# Patient Record
Sex: Male | Born: 1950
Health system: Southern US, Community
[De-identification: ages and names within clinical notes are randomized; demographics above are authoritative.]

## PROBLEM LIST (undated history)

## (undated) DIAGNOSIS — G4733 Obstructive sleep apnea (adult) (pediatric): Secondary | ICD-10-CM

## (undated) DIAGNOSIS — N200 Calculus of kidney: Secondary | ICD-10-CM

## (undated) DIAGNOSIS — Z87448 Personal history of other diseases of urinary system: Secondary | ICD-10-CM

## (undated) DIAGNOSIS — Z860100 Personal history of colon polyps, unspecified: Secondary | ICD-10-CM

## (undated) DIAGNOSIS — I2510 Atherosclerotic heart disease of native coronary artery without angina pectoris: Secondary | ICD-10-CM

## (undated) DIAGNOSIS — N529 Male erectile dysfunction, unspecified: Secondary | ICD-10-CM

## (undated) DIAGNOSIS — N400 Enlarged prostate without lower urinary tract symptoms: Secondary | ICD-10-CM

## (undated) DIAGNOSIS — H35719 Central serous chorioretinopathy, unspecified eye: Secondary | ICD-10-CM

## (undated) DIAGNOSIS — E785 Hyperlipidemia, unspecified: Secondary | ICD-10-CM

## (undated) DIAGNOSIS — R600 Localized edema: Secondary | ICD-10-CM

## (undated) DIAGNOSIS — I4821 Permanent atrial fibrillation: Secondary | ICD-10-CM

## (undated) DIAGNOSIS — I4819 Other persistent atrial fibrillation: Secondary | ICD-10-CM

## (undated) DIAGNOSIS — K219 Gastro-esophageal reflux disease without esophagitis: Secondary | ICD-10-CM

## (undated) DIAGNOSIS — R399 Unspecified symptoms and signs involving the genitourinary system: Secondary | ICD-10-CM

## (undated) DIAGNOSIS — Z87442 Personal history of urinary calculi: Secondary | ICD-10-CM

## (undated) DIAGNOSIS — Z87828 Personal history of other (healed) physical injury and trauma: Secondary | ICD-10-CM

## (undated) DIAGNOSIS — Z955 Presence of coronary angioplasty implant and graft: Secondary | ICD-10-CM

## (undated) DIAGNOSIS — G20A1 Parkinson's disease without dyskinesia, without mention of fluctuations: Secondary | ICD-10-CM

## (undated) DIAGNOSIS — I10 Essential (primary) hypertension: Secondary | ICD-10-CM

## (undated) DIAGNOSIS — Z7901 Long term (current) use of anticoagulants: Secondary | ICD-10-CM

## (undated) DIAGNOSIS — G20 Parkinson's disease: Secondary | ICD-10-CM

## (undated) HISTORY — DX: Male erectile dysfunction, unspecified: N52.9

## (undated) HISTORY — PX: CORONARY ANGIOPLASTY WITH STENT PLACEMENT: SHX49

## (undated) HISTORY — DX: Localized edema: R60.0

## (undated) HISTORY — PX: CARDIOVASCULAR STRESS TEST: SHX262

## (undated) HISTORY — DX: Permanent atrial fibrillation: I48.21

## (undated) HISTORY — PX: HERNIA REPAIR: SHX51

---
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
## 1999-08-11 ENCOUNTER — Encounter: Payer: Self-pay | Admitting: Emergency Medicine

## 1999-08-11 ENCOUNTER — Inpatient Hospital Stay (HOSPITAL_COMMUNITY): Admission: EM | Admit: 1999-08-11 | Discharge: 1999-08-13 | Payer: Self-pay | Admitting: Urology

## 1999-08-11 ENCOUNTER — Encounter (INDEPENDENT_AMBULATORY_CARE_PROVIDER_SITE_OTHER): Payer: Self-pay

## 1999-08-12 ENCOUNTER — Encounter: Payer: Self-pay | Admitting: Emergency Medicine

## 1999-08-12 ENCOUNTER — Encounter: Payer: Self-pay | Admitting: Urology

## 1999-08-14 ENCOUNTER — Inpatient Hospital Stay (HOSPITAL_COMMUNITY): Admission: AD | Admit: 1999-08-14 | Discharge: 1999-08-16 | Payer: Self-pay | Admitting: Urology

## 1999-08-15 ENCOUNTER — Encounter: Payer: Self-pay | Admitting: Urology

## 1999-10-29 ENCOUNTER — Ambulatory Visit (HOSPITAL_COMMUNITY): Admission: RE | Admit: 1999-10-29 | Discharge: 1999-10-29 | Payer: Self-pay | Admitting: Urology

## 1999-10-29 ENCOUNTER — Encounter: Payer: Self-pay | Admitting: Urology

## 2000-07-07 ENCOUNTER — Encounter: Payer: Self-pay | Admitting: Urology

## 2000-07-07 ENCOUNTER — Ambulatory Visit (HOSPITAL_COMMUNITY): Admission: RE | Admit: 2000-07-07 | Discharge: 2000-07-07 | Payer: Self-pay | Admitting: Urology

## 2002-01-20 DIAGNOSIS — I2510 Atherosclerotic heart disease of native coronary artery without angina pectoris: Secondary | ICD-10-CM

## 2002-01-20 HISTORY — DX: Atherosclerotic heart disease of native coronary artery without angina pectoris: I25.10

## 2002-01-23 ENCOUNTER — Ambulatory Visit (HOSPITAL_BASED_OUTPATIENT_CLINIC_OR_DEPARTMENT_OTHER): Admission: RE | Admit: 2002-01-23 | Discharge: 2002-01-23 | Payer: Self-pay | Admitting: Cardiology

## 2002-02-11 ENCOUNTER — Ambulatory Visit (HOSPITAL_COMMUNITY): Admission: RE | Admit: 2002-02-11 | Discharge: 2002-02-12 | Payer: Self-pay | Admitting: Interventional Cardiology

## 2002-02-28 ENCOUNTER — Encounter (HOSPITAL_COMMUNITY): Admission: RE | Admit: 2002-02-28 | Discharge: 2002-05-29 | Payer: Self-pay | Admitting: Cardiology

## 2002-05-30 ENCOUNTER — Encounter (HOSPITAL_COMMUNITY): Admission: RE | Admit: 2002-05-30 | Discharge: 2002-08-28 | Payer: Self-pay | Admitting: Cardiology

## 2004-04-26 ENCOUNTER — Ambulatory Visit: Payer: Self-pay | Admitting: Pulmonary Disease

## 2004-07-25 ENCOUNTER — Ambulatory Visit: Payer: Self-pay | Admitting: Pulmonary Disease

## 2006-04-13 ENCOUNTER — Emergency Department (HOSPITAL_COMMUNITY): Admission: EM | Admit: 2006-04-13 | Discharge: 2006-04-13 | Payer: Self-pay | Admitting: Emergency Medicine

## 2006-10-09 ENCOUNTER — Ambulatory Visit: Payer: Self-pay | Admitting: Pulmonary Disease

## 2009-01-20 HISTORY — PX: COLONOSCOPY WITH PROPOFOL: SHX5780

## 2010-06-07 NOTE — Cardiovascular Report (Signed)
NAME:  Isaiah Pena, Isaiah Pena NO.:  [id number]   MEDICAL RECORD NO.:  [id number]                    PATIENT TYPE:   LOCATION:                                       FACILITY:   PHYSICIAN:  Lyn Records III, M.D.            DATE OF BIRTH:  23-May-1950   DATE OF PROCEDURE:  DATE OF DISCHARGE:                              CARDIAC CATHETERIZATION   INDICATIONS FOR PROCEDURE:  Class III angina and abnormal Cardiolite  demonstrating a mostly fixed inferior wall defect, suggesting prior  infarction or severe intense ischemia.   PROCEDURE:  1. Left heart catheterization.  2. Selective coronary angio.  3. Left ventriculography.  4. Drug eluding Cipher Stent implantation of the distal right coronary.   DESCRIPTION OF PROCEDURE:  After informed consent a #6 French sheath was  placed in the right femoral artery using a modified Seldinger technique. A  #6 [place] A-2 multi-purpose catheter was used for hemodynamic recordings,  left ventriculography by Ham injection, and selective left and right  coronary angiography. We also used a #6 [place] #4 right Judkins' catheter  for right coronary angiography. After reviewing the cineangiograms, the  distal right coronary was identified as the culprit lesion for the patient's  recent symptoms. The distal right coronary contained a 99-100% stenosis with  distal left to right collaterals to the distal right coronary bed. After  speaking with the patient and the family, we felt that percutaneous  intervention on the right coronary was indicated. We had previously  discussed the risk of PCI including the possibility of death, bleeding,  acute infarction, and emergency bypass surgery. The patient was willing to  proceed.   We changed out to a #6 [employer] guide cathter and 300 cm long BMW wire.  We used a bolus followed by an infusion of Angiomax. We documented an OCP of  greater than 300 seconds. The patient also received 300  mg of Plavix in the  catheterization lab. PCI was then performed initially, pre dilating with a 9  mm long Maverick 2.5 mm diameter balloon. Several balloon inflictions were  performed and then with some degree of moderate backup difficulty, we were  able to deploy a 13 mm long by 2.5 mm Cipher Stent to 17 atmospheres in the  culprit region. The patient tolerated the procedure without any significant  complications. Should she develop re-stenosis, would consider stenting the  additional region, back to the mid third of the distal right coronary. The  RCA beyond the PDA and into the continuation, is diffusely diseased. ATT was  not performed post procedure. The patient was stable at the completion of  the procedure.   RESULTS:  A. Main:     a. Aortic pressure 135/81.     b. Left ventricular pressure 136/13.  B. Left ventriculography: The left ventricular cavity size is normal and     overall contractility is  normal. The ejection fraction is estimated    to be 65%. No mitral regurgitation is noted.  A. Coronary angiography:     a. Left main coronary: The left main is widely patent. No calcification        is noted. No significant obstruction was seen.     b. Left anterior descending coronary: The left anterior descending        coronary artery is large and wraps around the left ventricular    apex. Multiple areas of luminal irregularity are noted throughout the  proximal and mid segments of the left anterior    descending. There is an osteal 40% narrowing and there is also up to 50%  narrowing within the mid segment. A large bi-    bifurcating diagonal arises from the proximal one-third of the vessel and  is free of any significant obstruction, although there    are luminal irregularities.        c. Circumflex artery. The circumflex coronary artery is large and  trifurcates in the distal inferolateral region. There are luminal    irregularities throughout the circumflex. No area has  greater than 30%  obstruction noted. Irregularities are most prominent in    the mid vessel.  Beyond the bifurcation in the larger branch, after the  trifurcation, there is 60-70% stenosis in the distal    circumflex marginal.        d. Right coronary: The right coronary artery arises with a mild to  moderate Shepard's crook. There is proximal luminal    irregularities. The mid vessel is large and somewhat ectatic but tortuous.  The distal vessel contains a segmental region of    narrowing but a relatively focal area of 99% stenosis. The RCA beyond this  consists of a branch of the PDA and a    relatively small left ventricular branch. Left to right collaterals are  noted during left coronary injection.  IV: Percutaneous coronary intervention: After implantation of the Cipher  Stent the 99% stenosis in the distal right coronary was reduced to 0% with  TIMI grade free flow noted. We chose to spot film this region because of  anticipated difficulty with Stent movement down this relatively tortuous  vessel.   CONCLUSION:  1. Severe coronary artery disease with subtotally occluded distal right     coronary, producing the abnormal Cardiolite and the predominance of the     patient's exertional symptoms.  2. The patient has mild to moderate LAD and circumflex disease as described     above.  3. Normal LV function.  4. Successful PCI with skin implantation of the distal right coronary and     reduction in stenosis from 99% to 0% with TIMI grade free flow.   PLAN:  1. Plavix and aspirin times six months.  2.     Discharge February 12, 2002.  3. Follow-up appointment with Dr. Armanda Magic in 7-10 days.                                                 Isaiah Pena, M.D.    HWS/MEDQ  D:  02/11/2002  T:  02/12/2002  Job:  [id number]   cc:   Donia Guiles, M.D.  [address] [place]  [place] [address]  Fax: [phone]   Armanda Magic, M.D. [phone]  [address], [address]   [place], [place] [address]  Fax: [phone]

## 2011-03-11 NOTE — H&P (Signed)
  Isaiah Pena DOB: 06-09-1950  Chief Complaint: right knee pain  History of Present IllnessThe patient is a 61 year old male who is scheduled for an arthroscopic medial menisectomy in the right knee with Dr. Darrelyn Hillock on Friday March 21, 2011. He is now 2 months out from when symptoms got worse again. Symptoms began in July of 2012. Symptoms reported today include pain, swelling and catching. MRI performed in July of 2012 showed a tear of the posterior horn, medial meniscus right knee. The patient attempted to try conservative treatment with activity modification and cortisone injections without pain relief.    Allergies Sulfanomides   Social History Tobacco use. Never smoker.   Medication History Zetia (10MG  Tablet, Oral) Active. Metoprolol Tartrate (50MG  Tablet, Oral) Active. Amlodipine Besy-Benazepril HCl (10-40MG  Capsule, Oral) Active. Mirapex ( Oral) Specific dose unknown - Active. Hydrochlorothiazide (25MG  Tablet, Oral) Active. Finasteride (5MG  Tablet, Oral) Active. Atorvastatin Calcium (20MG  Tablet, Oral) Active. Oxybutynin Chloride (5MG  Tablet, Oral) Active.   Vitals 03/03/2011 2:18 PM Weight: 274 lb Height: 70 in Body Surface Area: 2.48 m Body Mass Index: 39.31 kg/m BP: 155/92 (Sitting, Left Arm, Standard)  Physical Exam He has a moderate knee effusion. He has pain with motion, mild genu varus. His calf was soft, nontender. No phlebitis. His circulation is intact. His patella tracks well in the midline. His cruciate's are intact. Collateral ligaments are intact.   Assessment & Plan Acute Medial Meniscal Tear (836.0) Arthroscopic medial menisectomy right knee

## 2011-03-17 ENCOUNTER — Encounter (HOSPITAL_COMMUNITY): Payer: Self-pay | Admitting: Pharmacy Technician

## 2011-03-18 ENCOUNTER — Encounter (HOSPITAL_COMMUNITY): Payer: Self-pay

## 2011-03-18 ENCOUNTER — Encounter (HOSPITAL_COMMUNITY)
Admission: RE | Admit: 2011-03-18 | Discharge: 2011-03-18 | Disposition: A | Discharge status: Home / Self Care | Payer: BC Managed Care – PPO | Source: Ambulatory Visit | Attending: Orthopedic Surgery | Admitting: Orthopedic Surgery

## 2011-03-18 ENCOUNTER — Ambulatory Visit (HOSPITAL_COMMUNITY)
Admission: RE | Admit: 2011-03-18 | Discharge: 2011-03-18 | Disposition: A | Discharge status: Home / Self Care | Payer: BC Managed Care – PPO | Source: Ambulatory Visit | Attending: Orthopedic Surgery | Admitting: Orthopedic Surgery

## 2011-03-18 ENCOUNTER — Other Ambulatory Visit: Payer: Self-pay

## 2011-03-18 DIAGNOSIS — IMO0002 Reserved for concepts with insufficient information to code with codable children: Secondary | ICD-10-CM | POA: Insufficient documentation

## 2011-03-18 DIAGNOSIS — X58XXXA Exposure to other specified factors, initial encounter: Secondary | ICD-10-CM | POA: Insufficient documentation

## 2011-03-18 DIAGNOSIS — G4730 Sleep apnea, unspecified: Secondary | ICD-10-CM | POA: Insufficient documentation

## 2011-03-18 DIAGNOSIS — I517 Cardiomegaly: Secondary | ICD-10-CM | POA: Insufficient documentation

## 2011-03-18 DIAGNOSIS — I10 Essential (primary) hypertension: Secondary | ICD-10-CM | POA: Insufficient documentation

## 2011-03-18 DIAGNOSIS — Z01818 Encounter for other preprocedural examination: Secondary | ICD-10-CM | POA: Insufficient documentation

## 2011-03-18 DIAGNOSIS — I2510 Atherosclerotic heart disease of native coronary artery without angina pectoris: Secondary | ICD-10-CM | POA: Insufficient documentation

## 2011-03-18 DIAGNOSIS — Z01812 Encounter for preprocedural laboratory examination: Secondary | ICD-10-CM | POA: Insufficient documentation

## 2011-03-18 HISTORY — DX: Personal history of other (healed) physical injury and trauma: Z87.828

## 2011-03-18 HISTORY — DX: Calculus of kidney: N20.0

## 2011-03-18 HISTORY — DX: Central serous chorioretinopathy, unspecified eye: H35.719

## 2011-03-18 HISTORY — DX: Essential (primary) hypertension: I10

## 2011-03-18 HISTORY — DX: Gastro-esophageal reflux disease without esophagitis: K21.9

## 2011-03-18 HISTORY — DX: Atherosclerotic heart disease of native coronary artery without angina pectoris: I25.10

## 2011-03-18 HISTORY — DX: Benign prostatic hyperplasia without lower urinary tract symptoms: N40.0

## 2011-03-18 HISTORY — DX: Hyperlipidemia, unspecified: E78.5

## 2011-03-18 LAB — BASIC METABOLIC PANEL
BUN: 16 mg/dL (ref 6–23)
Chloride: 107 mEq/L (ref 96–112)
Creatinine, Ser: 0.68 mg/dL (ref 0.50–1.35)
GFR calc Af Amer: >90 mL/min (ref >90)
GFR calc non Af Amer: >90 mL/min (ref >90)
Potassium: 3.8 mEq/L (ref 3.5–5.1)

## 2011-03-18 LAB — CBC
HCT: 42.3 % (ref 39.0–52.0)
MCHC: 34.8 g/dL (ref 30.0–36.0)
Platelets: 163 10*3/uL (ref 150–400)
RDW: 13.4 % (ref 11.5–15.5)
WBC: 7.6 10*3/uL (ref 4.0–10.5)

## 2011-03-18 LAB — SURGICAL PCR SCREEN: Staphylococcus aureus: NEGATIVE

## 2011-03-18 NOTE — Patient Instructions (Addendum)
20 JAK HAGGAR  03/18/2011   Your procedure is scheduled on:  Friday  03/21/11  AT 12:30PM  Report to [facility] at 10:30 AM.  Call this number if you have problems the morning of surgery: [phone]   Remember:BRING BIPAP MASK TO HOSPITAL  IN CASE DR. GIOFFRE DECIDES TO KEEP YOU IN THE HOSPITAL OVERNIGHT.   Do not eat food OR DRINK ANYTHING AFTER MIDNIGHT THE NIGHT BEFORE YOUR SURGERY.    Take these medicines the morning of surgery with A SIP OF WATER: LIPITOR, FINASTERIDE, METOPROLOL, DITROPAN, MIRAPEX, FLOMAX     Do not wear lotions, OR JEWELRY  Do not shave 48 hours prior to surgery.  Do not bring valuables to the hospital.  Contacts, dentures or bridgework may not be worn into surgery.  Leave suitcase in the car. After surgery it may be brought to your room.  For patients admitted to the hospital, checkout time is 11:00 AM the day of discharge.   Patients discharged the day of surgery will not be allowed to drive home.    Special Instructions: CHG Shower Use Special Wash: 1/2 bottle night before surgery and 1/2 bottle morning of surgery.   Please read over the following fact sheets that you were given: MRSA Information

## 2011-03-18 NOTE — Pre-Procedure Instructions (Addendum)
EKG AND CXR, CBC AND BMET WERE DONE TODAY-PREOP AT [facility]. PT HAS NOTE OF CARDIAC CLEARANCE FROM DR. TRACI TURNER ON THIS CHART-FAXED BY DR. Jeannetta Ellis OFFICE AND COPY OF RECENT CARDIOLOGY OFFICE NOTES 03/14/11 ON THIS CHART. NUCLEAR STRESS TEST REPORT 05/14/10 AND OLD EKG REPORT 05/03/10 ALSO ON CHART FROM DR. TURNER.

## 2011-03-21 ENCOUNTER — Encounter (HOSPITAL_COMMUNITY): Payer: Self-pay | Admitting: Anesthesiology

## 2011-03-21 ENCOUNTER — Encounter (HOSPITAL_COMMUNITY)
Admission: RE | Disposition: A | Discharge status: Home / Self Care | Payer: Self-pay | Source: Ambulatory Visit | Attending: Orthopedic Surgery

## 2011-03-21 ENCOUNTER — Ambulatory Visit (HOSPITAL_COMMUNITY): Payer: BC Managed Care – PPO | Admitting: Anesthesiology

## 2011-03-21 ENCOUNTER — Ambulatory Visit (HOSPITAL_COMMUNITY)
Admission: RE | Admit: 2011-03-21 | Discharge: 2011-03-21 | Disposition: A | Discharge status: Home / Self Care | Payer: BC Managed Care – PPO | Source: Ambulatory Visit | Attending: Orthopedic Surgery | Admitting: Orthopedic Surgery

## 2011-03-21 ENCOUNTER — Encounter (HOSPITAL_COMMUNITY): Payer: Self-pay | Admitting: *Deleted

## 2011-03-21 DIAGNOSIS — M25569 Pain in unspecified knee: Secondary | ICD-10-CM | POA: Insufficient documentation

## 2011-03-21 DIAGNOSIS — Z79899 Other long term (current) drug therapy: Secondary | ICD-10-CM | POA: Insufficient documentation

## 2011-03-21 DIAGNOSIS — X58XXXA Exposure to other specified factors, initial encounter: Secondary | ICD-10-CM | POA: Insufficient documentation

## 2011-03-21 DIAGNOSIS — IMO0002 Reserved for concepts with insufficient information to code with codable children: Secondary | ICD-10-CM | POA: Insufficient documentation

## 2011-03-21 DIAGNOSIS — M1710 Unilateral primary osteoarthritis, unspecified knee: Secondary | ICD-10-CM

## 2011-03-21 HISTORY — PX: KNEE ARTHROSCOPY: SHX127

## 2011-03-21 SURGERY — ARTHROSCOPY, KNEE
Anesthesia: General | Site: Knee | Laterality: Right | Wound class: Clean

## 2011-03-21 MED ORDER — BACITRACIN ZINC 500 UNIT/GM EX OINT
TOPICAL_OINTMENT | CUTANEOUS | Status: AC
  Filled 2011-03-21: qty 15

## 2011-03-21 MED ORDER — SUCCINYLCHOLINE CHLORIDE 20 MG/ML IJ SOLN
INTRAMUSCULAR | Status: DC | PRN
  Administered 2011-03-21: 100 mg via INTRAVENOUS

## 2011-03-21 MED ORDER — ONDANSETRON HCL 4 MG/2ML IJ SOLN
INTRAMUSCULAR | Status: DC | PRN
  Administered 2011-03-21: 4 mg via INTRAVENOUS

## 2011-03-21 MED ORDER — CHLORHEXIDINE GLUCONATE 4 % EX LIQD: 60.0000 mL | Freq: Once | CUTANEOUS | Status: DC

## 2011-03-21 MED ORDER — PROMETHAZINE HCL 25 MG/ML IJ SOLN: 6.2500 mg | INTRAMUSCULAR | Status: DC | PRN

## 2011-03-21 MED ORDER — LACTATED RINGERS IV SOLN
INTRAVENOUS | Status: DC
  Administered 2011-03-21: 1000 mL via INTRAVENOUS

## 2011-03-21 MED ORDER — ACETAMINOPHEN 10 MG/ML IV SOLN
INTRAVENOUS | Status: DC | PRN
  Administered 2011-03-21: 1000 mg via INTRAVENOUS

## 2011-03-21 MED ORDER — CEFAZOLIN SODIUM-DEXTROSE 2-3 GM-% IV SOLR
2.0000 g | Freq: Once | INTRAVENOUS | Status: AC
  Administered 2011-03-21: 2 g via INTRAVENOUS

## 2011-03-21 MED ORDER — AMLODIPINE BESYLATE 10 MG PO TABS
10.0000 mg | ORAL_TABLET | ORAL | Status: AC
  Administered 2011-03-21: 10 mg via ORAL
  Filled 2011-03-21: qty 1

## 2011-03-21 MED ORDER — PROPOFOL 10 MG/ML IV BOLUS
INTRAVENOUS | Status: DC | PRN
  Administered 2011-03-21: 75 mg via INTRAVENOUS
  Administered 2011-03-21: 200 mg via INTRAVENOUS

## 2011-03-21 MED ORDER — ACETAMINOPHEN 10 MG/ML IV SOLN
INTRAVENOUS | Status: AC
  Filled 2011-03-21: qty 100

## 2011-03-21 MED ORDER — BUPIVACAINE-EPINEPHRINE PF 0.25-1:200000 % IJ SOLN
INTRAMUSCULAR | Status: AC
  Filled 2011-03-21: qty 30

## 2011-03-21 MED ORDER — FENTANYL CITRATE 0.05 MG/ML IJ SOLN
INTRAMUSCULAR | Status: DC | PRN
  Administered 2011-03-21: 100 ug via INTRAVENOUS
  Administered 2011-03-21: 50 ug via INTRAVENOUS

## 2011-03-21 MED ORDER — BUPIVACAINE-EPINEPHRINE 0.25% -1:200000 IJ SOLN
INTRAMUSCULAR | Status: DC | PRN
  Administered 2011-03-21: 30 mL

## 2011-03-21 MED ORDER — LIDOCAINE HCL (CARDIAC) 20 MG/ML IV SOLN
INTRAVENOUS | Status: DC | PRN
  Administered 2011-03-21: 50 mg via INTRAVENOUS

## 2011-03-21 MED ORDER — FENTANYL CITRATE 0.05 MG/ML IJ SOLN: 25.0000 ug | INTRAMUSCULAR | Status: DC | PRN

## 2011-03-21 MED ORDER — LACTATED RINGERS IR SOLN
Status: DC | PRN
  Administered 2011-03-21: 6000 mL

## 2011-03-21 MED ORDER — BACITRACIN ZINC 500 UNIT/GM EX OINT
TOPICAL_OINTMENT | CUTANEOUS | Status: DC | PRN
  Administered 2011-03-21: 1 via TOPICAL

## 2011-03-21 MED ORDER — KETOROLAC TROMETHAMINE 30 MG/ML IJ SOLN
INTRAMUSCULAR | Status: AC
  Administered 2011-03-21: 30 mg via INTRAVENOUS
  Filled 2011-03-21: qty 1

## 2011-03-21 MED ORDER — CEFAZOLIN SODIUM-DEXTROSE 2-3 GM-% IV SOLR
INTRAVENOUS | Status: AC
  Filled 2011-03-21: qty 50

## 2011-03-21 MED ORDER — MIDAZOLAM HCL 5 MG/5ML IJ SOLN
INTRAMUSCULAR | Status: DC | PRN
  Administered 2011-03-21: 1 mg via INTRAVENOUS

## 2011-03-21 MED ORDER — LACTATED RINGERS IV SOLN
INTRAVENOUS | Status: DC | PRN
  Administered 2011-03-21: 12:00:00 via INTRAVENOUS

## 2011-03-21 MED ORDER — KETOROLAC TROMETHAMINE 30 MG/ML IJ SOLN
15.0000 mg | Freq: Once | INTRAMUSCULAR | Status: AC | PRN
  Administered 2011-03-21: 30 mg via INTRAVENOUS

## 2011-03-21 SURGICAL SUPPLY — 27 items
BANDAGE ELASTIC 4 VELCRO ST LF (GAUZE/BANDAGES/DRESSINGS) ×2 IMPLANT
BLADE GREAT WHITE 4.2 (BLADE) ×2 IMPLANT
BNDG COHESIVE 6X5 TAN STRL LF (GAUZE/BANDAGES/DRESSINGS) ×2 IMPLANT
CLOTH BEACON ORANGE TIMEOUT ST (SAFETY) ×2 IMPLANT
DRAPE LG THREE QUARTER DISP (DRAPES) ×2 IMPLANT
DRSG PAD ABDOMINAL 8X10 ST (GAUZE/BANDAGES/DRESSINGS) ×4 IMPLANT
DURAPREP 26ML APPLICATOR (WOUND CARE) ×2 IMPLANT
GLOVE BIOGEL PI IND STRL 8 (GLOVE) ×1 IMPLANT
GLOVE BIOGEL PI IND STRL 8.5 (GLOVE) ×1 IMPLANT
GLOVE BIOGEL PI INDICATOR 8 (GLOVE) ×1
GLOVE BIOGEL PI INDICATOR 8.5 (GLOVE) ×1
GLOVE ECLIPSE 8.0 STRL XLNG CF (GLOVE) ×6 IMPLANT
GOWN PREVENTION PLUS LG XLONG (DISPOSABLE) ×4 IMPLANT
GOWN PREVENTION PLUS XLARGE (GOWN DISPOSABLE) ×2 IMPLANT
GOWN STRL NON-REIN LRG LVL3 (GOWN DISPOSABLE) ×2 IMPLANT
GOWN STRL REIN XL XLG (GOWN DISPOSABLE) ×4 IMPLANT
MANIFOLD NEPTUNE II (INSTRUMENTS) ×2 IMPLANT
PACK ARTHROSCOPY WL (CUSTOM PROCEDURE TRAY) ×2 IMPLANT
PACK ICE MAXI GEL EZY WRAP (MISCELLANEOUS) ×2 IMPLANT
PAD MASON LEG HOLDER (PIN) ×2 IMPLANT
SET ARTHROSCOPY TUBING (MISCELLANEOUS) ×1
SET ARTHROSCOPY TUBING LN (MISCELLANEOUS) ×1 IMPLANT
SUT ETHILON 3 0 PS 1 (SUTURE) ×2 IMPLANT
TOWEL OR 17X26 10 PK STRL BLUE (TOWEL DISPOSABLE) ×6 IMPLANT
TUBING CONNECTING 10 (TUBING) ×2 IMPLANT
WAND 90 DEG TURBOVAC W/CORD (SURGICAL WAND) ×2 IMPLANT
WRAP KNEE MAXI GEL POST OP (GAUZE/BANDAGES/DRESSINGS) ×2 IMPLANT

## 2011-03-21 NOTE — Discharge Instructions (Signed)
Schedule appointment to see me in 10-12 days post op. Call the office at [phone] to make appointment.  Ice to the knee x 24 hours Aspirin 325mg twice a day started the day of surgery and for 2 weeks Crutches full weight bearing until you have control of your leg without them Remove all dressings in 48 hours and apply band-aids to each incision. Then you may shower. Any temperatures over 100 or severe calf pain, call the office 

## 2011-03-21 NOTE — Brief Op Note (Signed)
03/21/2011  1:59 PM  PATIENT:  Isaiah Pena  61 y.o. male  PRE-OPERATIVE DIAGNOSIS:  Right Knee Medial Meniscus Tear  POST-OPERATIVE DIAGNOSIS:  Right Knee Medial Meniscus Tear and degenerative arthriris of right knee.  PROCEDURE:  Procedure(s) (LRB): ARTHROSCOPY KNEE (Right) and medial meniscectomy and Abraision Chondroplasties of Medial Femoral Condyle and Patella with Synovectomies.Also Microfracture Technique of Medial Femoral Condyle.  SURGEON:  Surgeon(s) and Role:    * Jacki Cones, MD - Primary     ASSISTANTS: none   ANESTHESIA:   general  EBL:     BLOOD ADMINISTERED:none  DRAINS: none   LOCAL MEDICATIONS USED:  30cc of 0.25% Marcaine with Epinephrine  SPECIMEN:  No Specimen  DISPOSITION OF SPECIMEN:  N/A  COUNTS:  YES  TOURNIQUET:None DICTATION: .Other Dictation: Dictation Number [id number]  PLAN OF CARE: Discharge to home after PACU  PATIENT DISPOSITION:  PACU - hemodynamically stable.   Delay start of Pharmacological VTE agent (>24hrs) due to surgical blood loss or risk of bleeding: yes, but will start Aspirin 325mg  B.I.D tonight and for 2-weeks.

## 2011-03-21 NOTE — Transfer of Care (Signed)
Immediate Anesthesia Transfer of Care Note  Patient: Isaiah Pena  Procedure(s) Performed: Procedure(s) (LRB): ARTHROSCOPY KNEE (Right)  Patient Location: PACU  Anesthesia Type: General  Level of Consciousness: awake and alert   Airway & Oxygen Therapy: Patient Spontanous Breathing and Patient connected to face mask oxygen  Post-op Assessment: Report given to PACU RN and Post -op Vital signs reviewed and stable  Post vital signs: Reviewed and stable  Complications: No apparent anesthesia complications

## 2011-03-21 NOTE — Anesthesia Preprocedure Evaluation (Signed)
Anesthesia Evaluation  Patient identified by MRN, date of birth, ID band Patient awake    Reviewed: Allergy & Precautions, H&P , NPO status , Patient's Chart, lab work & pertinent test results  Airway Mallampati: II TM Distance: >3 FB Neck ROM: Full    Dental No notable dental hx.    Pulmonary sleep apnea and Continuous Positive Airway Pressure Ventilation ,  clear to auscultation  Pulmonary exam normal       Cardiovascular hypertension, + CAD and + Cardiac Stents Regular Normal    Neuro/Psych parkinsons dz  Neuromuscular disease Negative Psych ROS   GI/Hepatic Neg liver ROS,   Endo/Other  Morbid obesity  Renal/GU negative Renal ROS  Genitourinary negative   Musculoskeletal negative musculoskeletal ROS (+)   Abdominal   Peds negative pediatric ROS (+)  Hematology negative hematology ROS (+)   Anesthesia Other Findings   Reproductive/Obstetrics negative OB ROS                           Anesthesia Physical Anesthesia Plan  ASA: III  Anesthesia Plan: General   Post-op Pain Management:    Induction: Intravenous  Airway Management Planned: LMA and Oral ETT  Additional Equipment:   Intra-op Plan:   Post-operative Plan: Extubation in OR  Informed Consent: I have reviewed the patients History and Physical, chart, labs and discussed the procedure including the risks, benefits and alternatives for the proposed anesthesia with the patient or authorized representative who has indicated his/her understanding and acceptance.   Dental advisory given  Plan Discussed with: CRNA  Anesthesia Plan Comments:         Anesthesia Quick Evaluation

## 2011-03-21 NOTE — Interval H&P Note (Signed)
History and Physical Interval Note:  03/21/2011 12:47 PM  Isaiah Pena  has presented today for surgery, with the diagnosis of Right Knee Medial Meniscus Tear  The various methods of treatment have been discussed with the patient and family. After consideration of risks, benefits and other options for treatment, the patient has consented to  Procedure(s) (LRB): ARTHROSCOPY KNEE (Right) as a surgical intervention .  The patients' history has been reviewed, patient examined, no change in status, stable for surgery.  I have reviewed the patients' chart and labs.  Questions were answered to the patient's satisfaction.     Marry Kusch A

## 2011-03-21 NOTE — Anesthesia Postprocedure Evaluation (Signed)
  Anesthesia Post-op Note  Patient: Isaiah Pena  Procedure(s) Performed: Procedure(s) (LRB): ARTHROSCOPY KNEE (Right)  Patient Location: PACU  Anesthesia Type: General  Level of Consciousness: awake and alert   Airway and Oxygen Therapy: Patient Spontanous Breathing  Post-op Pain: mild  Post-op Assessment: Post-op Vital signs reviewed, Patient's Cardiovascular Status Stable, Respiratory Function Stable, Patent Airway and No signs of Nausea or vomiting  Post-op Vital Signs: stable  Complications: No apparent anesthesia complications

## 2011-03-21 NOTE — Progress Notes (Signed)
Pt instructed how to walk on crutches. He demonstrates how to use crutches in hallway.

## 2011-03-22 NOTE — Op Note (Signed)
NAMEGARRY, Isaiah Pena                ACCOUNT NO.:  [id number]  MEDICAL RECORD NO.:  [id number]  LOCATION:  [facility]                         FACILITY:  [facility]  PHYSICIAN:  Georges Lynch. Timber Marshman, M.D.DATE OF BIRTH:  03/07/1950  DATE OF PROCEDURE: DATE OF DISCHARGE:  03/21/2011                              OPERATIVE REPORT   PREOPERATIVE DIAGNOSES: 1. Degenerative arthritis, right knee. 2. Torn medial meniscus, right knee.  POSTOPERATIVE DIAGNOSES: 1. Degenerative arthritis, right knee. 2. Torn medial meniscus, right knee.  OPERATION: 1. Diagnostic arthroscopy, right knee. 2. Partial medial meniscectomy, right knee. 3. Abrasion chondroplasty, medial femoral condyle, right knee. 4. Abrasion chondroplasty of the patella, right knee. 5. Synovectomy suprapatellar pouch. 6. Microfracture technique, medial femoral condyle, right knee.  PROCEDURE IN DETAIL:  Under general anesthesia, routine orthopedic prep and draping of the right lower extremity was carried out.  The appropriate time-out was carried out before making incision.  The knee was placed in a leg holder.  He had 2 g of IV Ancef.  Note, the appropriate right leg was marked in the holding area as well.  Small punctate incision made in suprapatellar pouch, inflow cannula was inserted, and the knee was __________.  At this time, another small punctate incision was made in the anterolateral joint.  The arthroscope was entered from lateral approach and a complete diagnostic arthroscopy was carried out.  I went up into the suprapatellar pouch, did a partial synovectomy and did an abrasion chondroplasty of the patella.  Following that, I went down into the lateral joint.  He had early arthritic changes.  The meniscus was intact.  There was no specific procedures that were required in the lateral joint.  Cruciates were intact.  In the medial joint, he had marked fibrillations of the medial meniscus.  I introduced a shaver suction device, did  a partial medial meniscectomy. I also did a synovectomy utilizing the ArthroCare.  The abrasion chondroplasty was carried out over the medial femoral condyle, he had a large defect, he also had __________ a large defect in the tibial plateau medially.  I then introduced a shaver suction device, did an abrasion chondroplasty, and then I utilized the microfracture technique in the usual fashion on the medial femoral condyle.  I thoroughly irrigated out the knee, removed all the fluid.  There was no other abnormalities noted.  I closed all 3 punctate incisions with 3-0 nylon suture.  I injected 30 mL of 0.25% Marcaine and epinephrine in the knee joint and a sterile Neosporin bundle dressing was applied. Postoperatively will be on; 1. Aspirin 325 mg b.i.d. today twice a day and b.i.d. for 2 weeks. 2. He will be on crutches, partial to full weightbearing as tolerated. 3. He will see me in the office 12-14 days or prior if there is a     problem. 4. I will have him on Percocet 10/650 every 4 hours p.r.n. for pain.          ______________________________ Georges Lynch. Darrelyn Hillock, M.D.     RAG/MEDQ  D:  03/21/2011  T:  03/22/2011  Job:  [id number]

## 2011-03-26 ENCOUNTER — Encounter (HOSPITAL_COMMUNITY): Payer: Self-pay | Admitting: Orthopedic Surgery

## 2011-09-29 ENCOUNTER — Encounter: Payer: Self-pay | Admitting: Pulmonary Disease

## 2011-09-29 ENCOUNTER — Ambulatory Visit (INDEPENDENT_AMBULATORY_CARE_PROVIDER_SITE_OTHER): Payer: BC Managed Care – PPO | Admitting: Pulmonary Disease

## 2011-09-29 VITALS — BP 140/88 | HR 58 | Ht 70.0 in | Wt 267.8 lb

## 2011-09-29 DIAGNOSIS — G4733 Obstructive sleep apnea (adult) (pediatric): Secondary | ICD-10-CM

## 2011-09-29 HISTORY — DX: Obstructive sleep apnea (adult) (pediatric): G47.33

## 2011-09-29 NOTE — Patient Instructions (Addendum)
Will get you a new bilevel machine, along with a new mask. Work on weight loss followup with me in one year if doing well.

## 2011-09-29 NOTE — Assessment & Plan Note (Signed)
The pt has a history of significant sleep apnea, and has been compliant with his bilevel device.  He now is having issues with his old machine, and is due for replacement.  He also needs a new mask.  Will send order to DME for this, and I have encouraged him to work on weight loss.  He is to f/u with me in one year.

## 2011-09-29 NOTE — Progress Notes (Signed)
  Subjective:    Patient ID: Isaiah Pena, male    DOB: May 19, 1950, 61 y.o.   MRN: [id number]  HPI The pt comes in today for f/u of his known osa.  He has been wearing bilevel compliantly, but has not been seen in 2 yrs.  His current machine is very old, and is acting up.  He is also due for a new mask.  He feels he sleeps well, and his wife has not heard breakthru snoring.  He is using an old full face mask, and is having leak issues.  He is reasonably rested in the am's, and feels his daytime alertness is adequate.  His weight is down 10 pounds over the last 2 yrs, and his epworth today is only 6.   Review of Systems  Constitutional: Negative for fever and unexpected weight change.  HENT: Negative for ear pain, nosebleeds, congestion, sore throat, rhinorrhea, sneezing, trouble swallowing, dental problem, postnasal drip and sinus pressure.   Eyes: Negative for redness and itching.  Respiratory: Negative for cough, chest tightness, shortness of breath and wheezing.   Cardiovascular: Negative for palpitations and leg swelling.  Gastrointestinal: Negative for nausea and vomiting.  Genitourinary: Negative for dysuria.  Musculoskeletal: Negative for joint swelling.  Skin: Negative for rash.  Neurological: Negative for headaches.  Hematological: Does not bruise/bleed easily.  Psychiatric/Behavioral: Negative for dysphoric mood. The patient is not nervous/anxious.        Objective:   Physical Exam Ow male in nad Nose without purulence or discharge noted. No skin breakdown or pressure necrosis from cpap mask.  Neck without tmg or LN Chest clear Cor with rrr LE without edema, no cyanosis  Alert and oriented, moves all 4.        Assessment & Plan:

## 2011-10-02 ENCOUNTER — Encounter: Payer: Self-pay | Admitting: Pulmonary Disease

## 2011-10-07 ENCOUNTER — Telehealth: Payer: Self-pay | Admitting: Pulmonary Disease

## 2011-10-07 NOTE — Telephone Encounter (Signed)
Spoke to [email or web address]  pt is eligible for  A new bipap 10/13/11 and will be able to get it then Tobe Sos

## 2011-10-07 NOTE — Telephone Encounter (Signed)
Left message with [email or web address]  Tobe Sos

## 2011-10-22 ENCOUNTER — Telehealth: Payer: Self-pay | Admitting: Pulmonary Disease

## 2011-10-22 NOTE — Telephone Encounter (Signed)
Order given to [facility] on 09/29/11 for Bilevel. Pt has not heard anything from [facility]. Staff message sent to Mayra Reel to check on status of order ASAP, as patient is wanting this addressed TODAY. Rhonda J Cobb

## 2011-10-23 NOTE — Telephone Encounter (Signed)
Called and spoke with patient and asked him what was wrong with his current bilevel. Pt states that the pressure is inconstant, while the machine has been running for awhile, the bilevel suddenly stops working all together and the display is very dim and hard to read.  Have sent this message to [facility] to hopefully contact patient today in regards to obtaining new bipap. Pt is aware. Rhonda J Cobb

## 2012-01-29 ENCOUNTER — Encounter (INDEPENDENT_AMBULATORY_CARE_PROVIDER_SITE_OTHER): Payer: BC Managed Care – PPO | Admitting: Ophthalmology

## 2012-01-29 DIAGNOSIS — H2510 Age-related nuclear cataract, unspecified eye: Secondary | ICD-10-CM

## 2012-01-29 DIAGNOSIS — H43819 Vitreous degeneration, unspecified eye: Secondary | ICD-10-CM

## 2012-01-29 DIAGNOSIS — H35039 Hypertensive retinopathy, unspecified eye: Secondary | ICD-10-CM

## 2012-01-29 DIAGNOSIS — H3530 Unspecified macular degeneration: Secondary | ICD-10-CM

## 2012-01-29 DIAGNOSIS — H35719 Central serous chorioretinopathy, unspecified eye: Secondary | ICD-10-CM

## 2012-01-29 DIAGNOSIS — I10 Essential (primary) hypertension: Secondary | ICD-10-CM

## 2012-10-01 ENCOUNTER — Ambulatory Visit: Payer: BC Managed Care – PPO | Admitting: Pulmonary Disease

## 2012-10-22 ENCOUNTER — Ambulatory Visit (INDEPENDENT_AMBULATORY_CARE_PROVIDER_SITE_OTHER): Payer: BC Managed Care – PPO | Admitting: Pulmonary Disease

## 2012-10-22 ENCOUNTER — Encounter: Payer: Self-pay | Admitting: Pulmonary Disease

## 2012-10-22 VITALS — BP 150/86 | HR 68 | Temp 97.7°F | Ht 69.0 in | Wt 261.6 lb

## 2012-10-22 DIAGNOSIS — G4733 Obstructive sleep apnea (adult) (pediatric): Secondary | ICD-10-CM

## 2012-10-22 NOTE — Assessment & Plan Note (Signed)
The patient is doing well with bilevel, and has been keeping up with his mask changes and supplies.  I have encouraged him to continue with his device, and to work aggressively on weight loss.

## 2012-10-22 NOTE — Patient Instructions (Addendum)
Continue with your bipap, and let me know if any issues. Work on weight loss followup with me in one year if doing well.

## 2012-10-22 NOTE — Progress Notes (Signed)
  Subjective:    Patient ID: Isaiah Pena, male    DOB: [birth year], 62 y.o.   MRN: [id number]  HPI The patient comes in today for followup of his obstructive sleep apnea.  He is wearing CPAP compliantly, and is having no issues with his mask fit or pressure.  He feels that he is sleeping well with the device, and is satisfied with his daytime alertness.  The patient's weight has decreased 6 pounds since the last visit.   Review of Systems  Constitutional: Negative for fever and unexpected weight change.  HENT: Positive for postnasal drip. Negative for ear pain, nosebleeds, congestion, sore throat, rhinorrhea, sneezing, trouble swallowing, dental problem and sinus pressure.   Eyes: Negative for redness and itching.  Respiratory: Negative for cough, chest tightness, shortness of breath and wheezing.   Cardiovascular: Negative for palpitations and leg swelling.  Gastrointestinal: Negative for nausea and vomiting.  Genitourinary: Negative for dysuria.  Musculoskeletal: Negative for joint swelling.  Skin: Negative for rash.  Neurological: Negative for headaches.  Hematological: Does not bruise/bleed easily.  Psychiatric/Behavioral: Negative for dysphoric mood. The patient is not nervous/anxious.        Objective:   Physical Exam Overweight male in no acute distress Nose without purulence or discharge noted No skin breakdown or pressure necrosis from the CPAP mask Neck without lymphadenopathy or thyromegaly Lower extremities with mild edema, no cyanosis Alert and oriented, moves all 4 extremities.       Assessment & Plan:

## 2012-11-01 ENCOUNTER — Ambulatory Visit (INDEPENDENT_AMBULATORY_CARE_PROVIDER_SITE_OTHER): Payer: BC Managed Care – PPO | Admitting: *Deleted

## 2012-11-01 DIAGNOSIS — E7800 Pure hypercholesterolemia, unspecified: Secondary | ICD-10-CM

## 2012-11-01 LAB — LIPID PANEL
HDL: 44.2 mg/dL (ref >39.00)
Total CHOL/HDL Ratio: 2
VLDL: 9.6 mg/dL (ref 0.0–40.0)

## 2012-11-01 LAB — ALT: ALT: 21 U/L (ref 0–53)

## 2012-11-03 NOTE — Progress Notes (Signed)
Printed and mailed to pt to make aware.

## 2012-11-04 ENCOUNTER — Encounter: Payer: Self-pay | Admitting: Cardiology

## 2012-11-04 DIAGNOSIS — I2510 Atherosclerotic heart disease of native coronary artery without angina pectoris: Secondary | ICD-10-CM | POA: Insufficient documentation

## 2012-11-04 DIAGNOSIS — N200 Calculus of kidney: Secondary | ICD-10-CM | POA: Insufficient documentation

## 2012-11-04 DIAGNOSIS — R600 Localized edema: Secondary | ICD-10-CM | POA: Insufficient documentation

## 2012-11-04 DIAGNOSIS — E785 Hyperlipidemia, unspecified: Secondary | ICD-10-CM | POA: Insufficient documentation

## 2012-11-04 DIAGNOSIS — K219 Gastro-esophageal reflux disease without esophagitis: Secondary | ICD-10-CM | POA: Insufficient documentation

## 2012-11-04 DIAGNOSIS — Z87828 Personal history of other (healed) physical injury and trauma: Secondary | ICD-10-CM | POA: Insufficient documentation

## 2012-11-04 DIAGNOSIS — N400 Enlarged prostate without lower urinary tract symptoms: Secondary | ICD-10-CM | POA: Insufficient documentation

## 2012-11-04 DIAGNOSIS — H35719 Central serous chorioretinopathy, unspecified eye: Secondary | ICD-10-CM | POA: Insufficient documentation

## 2012-11-04 DIAGNOSIS — I10 Essential (primary) hypertension: Secondary | ICD-10-CM | POA: Insufficient documentation

## 2012-11-08 ENCOUNTER — Encounter: Payer: Self-pay | Admitting: Cardiology

## 2012-11-08 ENCOUNTER — Ambulatory Visit (INDEPENDENT_AMBULATORY_CARE_PROVIDER_SITE_OTHER): Payer: BC Managed Care – PPO | Admitting: Cardiology

## 2012-11-08 VITALS — BP 136/74 | HR 56 | Ht 69.0 in | Wt 261.0 lb

## 2012-11-08 DIAGNOSIS — R600 Localized edema: Secondary | ICD-10-CM

## 2012-11-08 DIAGNOSIS — G4733 Obstructive sleep apnea (adult) (pediatric): Secondary | ICD-10-CM

## 2012-11-08 DIAGNOSIS — R609 Edema, unspecified: Secondary | ICD-10-CM

## 2012-11-08 DIAGNOSIS — I4819 Other persistent atrial fibrillation: Secondary | ICD-10-CM

## 2012-11-08 DIAGNOSIS — I480 Paroxysmal atrial fibrillation: Secondary | ICD-10-CM

## 2012-11-08 DIAGNOSIS — I10 Essential (primary) hypertension: Secondary | ICD-10-CM

## 2012-11-08 DIAGNOSIS — I4891 Unspecified atrial fibrillation: Secondary | ICD-10-CM

## 2012-11-08 DIAGNOSIS — E785 Hyperlipidemia, unspecified: Secondary | ICD-10-CM

## 2012-11-08 DIAGNOSIS — I2510 Atherosclerotic heart disease of native coronary artery without angina pectoris: Secondary | ICD-10-CM

## 2012-11-08 HISTORY — DX: Other persistent atrial fibrillation: I48.19

## 2012-11-08 NOTE — Progress Notes (Signed)
[address] [place], [place]  [address] Phone: [phone] Fax:  [phone]  Date:  11/08/2012   ID:  ALRIC GEISE, DOB July 12, 1950, MRN [id number]  PCP:  Lupita Raider, MD  Cardiologist:  Armanda Magic, MD     History of Present Illness: Isaiah Pena is a 62 y.o. male with a history of CAD/HTN/lipids/PAF and OSA who presents today for followup.  He is doing well.  He denies any chest pain, SOB, DOE, LE edema, dizziness, palpitations or syncope.  He is tolerating his BiPAP well.  He feels rested in the am and has no daytime sleepiness.  He tolerates his mask and pressure well.     Wt Readings from Last 3 Encounters:  11/08/12 261 lb (118.389 kg)  10/22/12 261 lb 9.6 oz (118.661 kg)  09/29/11 267 lb 12.8 oz (121.473 kg)     Past Medical History  Diagnosis Date  . Sleep apnea     BIPAP  . Central serous retinopathy     LEFT EYE--PT IS LEGALLY BLIND IN LEFT EYE--WAS BEING TREATED WITH METHOTREXATE BUT TREATMENT DID NOT HELP--PT SEES SPECIALIST AT [facility]  . Hypertension   . GERD (gastroesophageal reflux disease)   . Kidney stone     HX OF STONE-PASSED  . History of torn meniscus of right knee   . Edema extremities     CHRONIC  . Hyperlipidemia   . BPH (benign prostatic hyperplasia)   . Neuromuscular disorder     PARKINSON'S DISEASE--HAS WEAKNESS OF EXTREMITIES AND BALANCE ISSUES.  DR. Threasa Beards WITH [facility] HAS PT ON MIRAPEX AT PRESENT TIME  . Coronary artery disease 2004    s/p PCI of the RCA  . Erectile dysfunction   . PAF (paroxysmal atrial fibrillation)     Current Outpatient Prescriptions  Medication Sig Dispense Refill  . Amantadine HCl 100 MG tablet Take 1 tablet by mouth 2 (two) times daily.      Marland Kitchen amLODipine-benazepril (LOTREL) 10-40 MG per capsule Take 1 capsule by mouth every morning.      . APOKYN 10 MG/ML SOLN 3-5 times daily      . aspirin 325 MG EC tablet Take 325 mg by mouth daily.      Marland Kitchen atorvastatin (LIPITOR) 20 MG tablet Take 20  mg by mouth every morning.      Marland Kitchen AZILECT 1 MG TABS tablet Take 1 tablet by mouth daily.      . Cholecalciferol (VITAMIN D) 2000 UNITS CAPS Take by mouth daily.      . clonazePAM (KLONOPIN) 0.5 MG tablet as needed.       Marland Kitchen co-enzyme Q-10 30 MG capsule Take 30 mg by mouth every morning.      . ezetimibe (ZETIA) 10 MG tablet Take 10 mg by mouth every morning.      . fish oil-omega-3 fatty acids 1000 MG capsule Take 2 g by mouth every morning.      Marland Kitchen GLUTATHIONE PO Take by mouth at bedtime.      Marland Kitchen GLUTATHIONE PO Inject into the vein. IV push once monthly      . hydrochlorothiazide (HYDRODIURIL) 25 MG tablet Take 25 mg by mouth every other day.       . metoprolol (LOPRESSOR) 50 MG tablet Take 50 mg by mouth 2 (two) times daily.      . Multiple Vitamin (MULITIVITAMIN WITH MINERALS) TABS Take 1 tablet by mouth every morning.      Marland Kitchen NEUPRO 8 MG/24HR  PT24 daily.      Marland Kitchen oxybutynin (DITROPAN) 5 MG tablet Take 5 mg by mouth every morning.      . Tamsulosin HCl (FLOMAX) 0.4 MG CAPS Take 0.4 mg by mouth daily after breakfast.       No current facility-administered medications for this visit.    Allergies:    Allergies  Allergen Reactions  . Sulfa Antibiotics Hives    Social History:  The patient  reports that he has never smoked. He has never used smokeless tobacco. He reports that he does not drink alcohol or use illicit drugs.   Family History:  The patient's family history is not on file.   ROS:  Please see the history of present illness.      All other systems reviewed and negative.   PHYSICAL EXAM: VS:  BP 136/74  Pulse 56  Ht 5\' 9"  (1.753 m)  Wt 261 lb (118.389 kg)  BMI 38.53 kg/m2 Well nourished, well developed, in no acute distress HEENT: normal Neck: no JVD Cardiac:  normal S1, S2; RRR; no murmur Lungs:  clear to auscultation bilaterally, no wheezing, rhonchi or rales Abd: soft, nontender, no hepatomegaly Ext: trace edema Skin: warm and dry Neuro:  CNs 2-12 intact, no focal  abnormalities noted       ASSESSMENT AND PLAN:  1. OSA on BiPAP  - His download today showed an AHI of 9.7/hr on 14/10cm H2o and 53% compliance in using more than 4 hours nightly.  He says that his full face mask is leaking which I suspect is why his AHI is elevated.  He would like to try a new full face mask so I will order him a ResMed Airfit F10 mask.  -I will get a download again in 6 weeks 2. CAD with no angina  - continue ASA 3. dyslipdiemia  - continue fish oil/Zetia/Lipitor 4. HTN - controlled  - continue Lotrel/HCTZ/lopressor 5. PAF with no reoccurence  - continue Lopressor 6.   Chronic LE edema  - continue HCTZ  Followup with me in 6 months  Signed, Armanda Magic, MD 11/08/2012 5:04 PM

## 2012-11-08 NOTE — Patient Instructions (Signed)
We are ordering a new mask for you Bipap machine. We are ordering a Res Med Airfit F10 mask  We will get a download in 6 weeks   Your physician wants you to follow-up in: 6 months with Dr. Sherlyn Lick will receive a reminder letter in the mail two months in advance. If you don't receive a letter, please call our office to schedule the follow-up appointment.

## 2012-12-27 ENCOUNTER — Other Ambulatory Visit: Payer: Self-pay | Admitting: Cardiology

## 2013-01-17 ENCOUNTER — Other Ambulatory Visit: Payer: Self-pay | Admitting: Cardiology

## 2013-01-26 ENCOUNTER — Ambulatory Visit (INDEPENDENT_AMBULATORY_CARE_PROVIDER_SITE_OTHER): Payer: BC Managed Care – PPO | Admitting: Ophthalmology

## 2013-01-26 DIAGNOSIS — H2510 Age-related nuclear cataract, unspecified eye: Secondary | ICD-10-CM

## 2013-01-26 DIAGNOSIS — H35719 Central serous chorioretinopathy, unspecified eye: Secondary | ICD-10-CM

## 2013-01-26 DIAGNOSIS — I10 Essential (primary) hypertension: Secondary | ICD-10-CM

## 2013-01-26 DIAGNOSIS — D3130 Benign neoplasm of unspecified choroid: Secondary | ICD-10-CM

## 2013-01-26 DIAGNOSIS — H43819 Vitreous degeneration, unspecified eye: Secondary | ICD-10-CM

## 2013-01-26 DIAGNOSIS — H3530 Unspecified macular degeneration: Secondary | ICD-10-CM

## 2013-01-26 DIAGNOSIS — H35039 Hypertensive retinopathy, unspecified eye: Secondary | ICD-10-CM

## 2013-01-28 ENCOUNTER — Ambulatory Visit (INDEPENDENT_AMBULATORY_CARE_PROVIDER_SITE_OTHER): Payer: BC Managed Care – PPO | Admitting: Ophthalmology

## 2013-05-11 ENCOUNTER — Other Ambulatory Visit: Payer: Self-pay | Admitting: Cardiology

## 2013-07-16 ENCOUNTER — Other Ambulatory Visit: Payer: Self-pay | Admitting: Cardiology

## 2013-08-12 ENCOUNTER — Encounter: Payer: Self-pay | Admitting: Cardiology

## 2013-08-13 ENCOUNTER — Other Ambulatory Visit: Payer: Self-pay | Admitting: Cardiology

## 2013-10-28 ENCOUNTER — Encounter (INDEPENDENT_AMBULATORY_CARE_PROVIDER_SITE_OTHER): Payer: Self-pay

## 2013-10-28 ENCOUNTER — Encounter: Payer: Self-pay | Admitting: Pulmonary Disease

## 2013-10-28 ENCOUNTER — Ambulatory Visit (INDEPENDENT_AMBULATORY_CARE_PROVIDER_SITE_OTHER): Payer: BC Managed Care – PPO | Admitting: Pulmonary Disease

## 2013-10-28 VITALS — BP 122/64 | HR 56 | Temp 97.8°F | Ht 70.0 in | Wt 274.2 lb

## 2013-10-28 DIAGNOSIS — G4733 Obstructive sleep apnea (adult) (pediatric): Secondary | ICD-10-CM

## 2013-10-28 NOTE — Progress Notes (Signed)
   Subjective:    Patient ID: Isaiah Pena, male    DOB: [birth year], 63 y.o.   MRN: [id number]  HPI The patient comes in today for followup of his obstructive sleep apnea. He is wearing bilevel compliantly, and feels that he is sleeping well with the device. His mask fits well as long as he keeps up with the seals, but is having difficulty with his home care company getting necessary supplies. He feels that his alertness is adequate during the day.   Review of Systems  Constitutional: Negative for fever and unexpected weight change.  HENT: Negative for congestion, dental problem, ear pain, nosebleeds, postnasal drip, rhinorrhea, sinus pressure, sneezing, sore throat and trouble swallowing.   Eyes: Negative for redness and itching.  Respiratory: Negative for cough, chest tightness, shortness of breath and wheezing.   Cardiovascular: Negative for palpitations and leg swelling.  Gastrointestinal: Negative for nausea and vomiting.  Genitourinary: Negative for dysuria.  Musculoskeletal: Negative for joint swelling.  Skin: Negative for rash.  Neurological: Negative for headaches.  Hematological: Does not bruise/bleed easily.  Psychiatric/Behavioral: Negative for dysphoric mood. The patient is not nervous/anxious.        Objective:   Physical Exam Overweight male in no acute distress Nose without purulence or discharge noted Neck without lymphadenopathy or thyromegaly No skin breakdown or pressure necrosis from the CPAP mask Lower extremities with mild edema, no cyanosis Alert and oriented, moves all 4 extremities       Assessment & Plan:

## 2013-10-28 NOTE — Patient Instructions (Signed)
Stay on your bilevel, and keep up with your mask changes and supplies. Let us know if you are having issues with supplies Please bring your sd card by in next few weeks so we can check settings and pressure. Work on weight loss followup with me again in one year.

## 2013-10-28 NOTE — Assessment & Plan Note (Signed)
The patient feels that he is doing fairly well on his bilevel device. He needs to continue to keep up with his mask cushion changes and supplies, and also needs to work on weight loss. He has not had a download off of his device in quite some time, and I have asked him to bring his card by the office at his convenience so that we can do this.

## 2013-12-17 ENCOUNTER — Other Ambulatory Visit: Payer: Self-pay | Admitting: Cardiology

## 2014-01-01 ENCOUNTER — Other Ambulatory Visit: Payer: Self-pay | Admitting: Cardiology

## 2014-01-13 ENCOUNTER — Other Ambulatory Visit: Payer: Self-pay | Admitting: Cardiology

## 2014-01-16 ENCOUNTER — Other Ambulatory Visit: Payer: Self-pay

## 2014-01-20 ENCOUNTER — Other Ambulatory Visit: Payer: Self-pay | Admitting: Cardiology

## 2014-01-23 NOTE — Telephone Encounter (Signed)
Needs to get this filled by PCP until seen by me

## 2014-01-30 ENCOUNTER — Ambulatory Visit (INDEPENDENT_AMBULATORY_CARE_PROVIDER_SITE_OTHER): Payer: BC Managed Care – PPO | Admitting: Ophthalmology

## 2014-02-20 ENCOUNTER — Ambulatory Visit (INDEPENDENT_AMBULATORY_CARE_PROVIDER_SITE_OTHER): Payer: 59 | Admitting: Ophthalmology

## 2014-02-28 ENCOUNTER — Ambulatory Visit (INDEPENDENT_AMBULATORY_CARE_PROVIDER_SITE_OTHER): Payer: 59 | Admitting: Ophthalmology

## 2014-03-11 ENCOUNTER — Other Ambulatory Visit: Payer: Self-pay | Admitting: Cardiology

## 2014-04-03 ENCOUNTER — Other Ambulatory Visit: Payer: Self-pay | Admitting: Cardiology

## 2014-04-15 ENCOUNTER — Other Ambulatory Visit: Payer: Self-pay | Admitting: Cardiology

## 2014-04-17 ENCOUNTER — Other Ambulatory Visit: Payer: Self-pay

## 2014-05-14 ENCOUNTER — Other Ambulatory Visit: Payer: Self-pay | Admitting: Cardiology

## 2014-05-15 NOTE — Telephone Encounter (Signed)
Please advise on refill. Patient has not been seen since 2014 and was to return in 6 months. Thanks, MI

## 2014-05-19 ENCOUNTER — Other Ambulatory Visit: Payer: Self-pay

## 2014-05-19 MED ORDER — ATORVASTATIN CALCIUM 10 MG PO TABS: ORAL_TABLET | ORAL | Status: DC

## 2014-10-18 ENCOUNTER — Other Ambulatory Visit: Payer: Self-pay | Admitting: Cardiology

## 2014-10-23 ENCOUNTER — Encounter: Payer: Self-pay | Admitting: Sports Medicine

## 2014-10-23 ENCOUNTER — Ambulatory Visit (INDEPENDENT_AMBULATORY_CARE_PROVIDER_SITE_OTHER): Payer: 59 | Admitting: Sports Medicine

## 2014-10-23 VITALS — BP 141/72 | HR 50 | Ht 68.5 in | Wt 266.0 lb

## 2014-10-23 DIAGNOSIS — M7661 Achilles tendinitis, right leg: Secondary | ICD-10-CM

## 2014-10-23 NOTE — Progress Notes (Signed)
CC: Right Achilles Tendon Pain   HPI:  Isaiah Pena presents to clinic as a referral for chronic right ankle pain.  He reports that he had cellulitis in both lower extremities 6 months ago.  Isaiah cellulitis started as a slowly healing wound on the anterior surface of this tib/fib.  He was treated with oral antibiotics and a topical cream; he completed treatment 3 months ago. Currrently he reports that the anterior surface of Isaiah right lower extremity is still  slightly erythematous and swollen.  In addition, he also reports chronic pain in Isaiah achilles tendon for the past 3-4 months. Patient reports no trauma or injury to Isaiah tendon or ankle. He has been taking Advil and Aleve which helps slightly with Isaiah pain. He states that the swelling in Isaiah right lower extremity has not completely returned to Isaiah baseline. Of note the patient reports that he recently completed physical therapy for Parkinson's disease to work on Isaiah gait. He is here today with Isaiah Pena  He denies any new injury, ecchymosis, numbness, weakness, or paresthesias.   ROS: Patient denies any fevers or chills. All other systems reviewed and are negative.  Past medical history reviewed. Medical history is significant for the aforementioned Parkinson's disease and bilateral lower extremity cellulitis. Medications reviewed Allergies reviewed  Objective: BP 141/72 mmHg  Pulse 50  Ht 5' 8.5" (1.74 m)  Wt 266 lb (120.657 kg)  BMI 39.85 kg/m2 Gen: NAD, alert, cooperative, and pleasant. MSK: Visible mild erythema and swelling to mid shin. 1+ pitting edema distal to tibial tubercle. Foot/ankle: TTP of  Achilles tendon. Pain with plantar and dorsiflexion. Range of motion is full in all directions. Strength is 4/5 with inversion and eversion on right  Remainder of strength is full.  Neuro: Alert and oriented, Speech clear. Gait: pain to tendon with push off phase. Walks with a slightly shuffling gait.  U/S: MSK ultrasound of the right  Achilles tendon was performed. Images were obtained in both long and short view. The mid substance of the Achilles tendon is markedly thickened and measures 1.71 cm. There are hypoechoic changes seen in the inferior most portion of the tendon on both long and short views. Findings are consistent with Achilles tendinopathy and partial Achilles tendon tearing   Assessment/Plan: Achilles Tendinopathy and partial tear -5/16" Heel lift: to be placed in R shoe to decrease pressure on tendon  - Physical therapy: treat and evaluate tendon 2/2 to tear - Would consider starting patient on NTG 1/4 patch to speed healing but will hold off at this time 2/2 to swelling in area where patches would need to be applied.  - F/u 4-6 weeks to re-evaluate and ultrasound    Patient seen and evaluated with medical student. I agree with above plan of care. Patient's ultrasound shows pathology within the Achilles tendon itself, specifically Achilles tendinopathy with partial tearing. I do not see any ultrasound findings concerning for abscess formation. The patient's Parkinson's disease will make this difficult to treat. I think he would do well with the return to physical therapy for exercises and modalities concentrating on the right Achilles tendon. The heel lift will also help with Isaiah pain. He is taking 800 mg of Advil daily and although this is a fairly low dose I have asked that he try to wean to taking this only as needed. I did consider nitroglycerin at Isaiah recent history of cellulitis and Isaiah edematous skin make me concerned about a possible increased risk of contact dermatitis  from the patch. Patient will follow-up with me in one month for reevaluation.

## 2014-11-03 ENCOUNTER — Ambulatory Visit: Payer: BC Managed Care – PPO | Admitting: Pulmonary Disease

## 2014-11-03 ENCOUNTER — Ambulatory Visit: Payer: Self-pay | Admitting: Pulmonary Disease

## 2014-11-10 ENCOUNTER — Other Ambulatory Visit: Payer: Self-pay | Admitting: *Deleted

## 2014-11-10 DIAGNOSIS — M7660 Achilles tendinitis, unspecified leg: Secondary | ICD-10-CM

## 2014-11-20 ENCOUNTER — Ambulatory Visit: Payer: 59 | Admitting: Sports Medicine

## 2014-11-27 ENCOUNTER — Ambulatory Visit (INDEPENDENT_AMBULATORY_CARE_PROVIDER_SITE_OTHER): Payer: 59 | Admitting: Sports Medicine

## 2014-11-27 ENCOUNTER — Encounter: Payer: Self-pay | Admitting: Sports Medicine

## 2014-11-27 VITALS — BP 130/72 | Ht 69.0 in | Wt 262.0 lb

## 2014-11-27 DIAGNOSIS — M7661 Achilles tendinitis, right leg: Secondary | ICD-10-CM

## 2014-11-27 NOTE — Progress Notes (Signed)
Subjective:    Patient ID: Isaiah Pena, male    DOB: 07/31/1950, 64 y.o.   MRN: [id number]  HPI Isaiah Pena is a 64 year old male with past medical history of Parkinson's disease among several other comorbidities presenting to sports medicine clinic for follow-up of chronic RIGHT ankle pain. He was last seen on 10/23/2014 and diagnosed with RIGHT Achilles tendinopathy with partial tear. At that time a physical therapy consult was placed and the patient was placed into bilateral 5/16 inch heel lifts.  He reports that his pain has been significantly reduced with the addition of the heel lifts and some home exercises provided at the last appointment. He has recently been traveling a lot to visit family, spending 8 days in [place] and 10 days in [place] and thus has not established physical therapy. However, his first appointment for physical therapy is later this week.  He denies any new pain in his right foot/heel/ankle. Denies any instability. Denies any weakness compared to the contralateral limb or new swelling compared to the contralateral limb.  He denies any trauma, hospital admissions or drastic changes in his medical care/medication since the last appointment  His pain remains worse with prolonged standing, excessive walking especially on hills/ramps and is worse with activity, better with rest.   Past medical history, PSHx, Medications, Allergies and Social history were all reviewed and updated in the EMR  Review of Systems  ROS: -10 point review of systems was negative other than detailed above      Objective:   Physical Exam  Gen: NAD, alert, cooperative, and pleasant.  Cardiopulmonary: Nonlabored respirations. No JVD. Pulses regular and equal in the bilateral upper and lower extremities distally.  Extremities: Visible mild erythema and swelling to mid shin. 1+ pitting edema distal to tibial tubercle but equal in each extremity. There is some evidence of venous  stasis and dry skin/skin cracking secondary to the stasis/persistent edema.  Musculoskeletal examination of the bilateral foot/ankle:  -Inspection as above -Patient with mild TTP of the RIGHT Achilles tendon both in the mid belly and at the calcaneal insertion.  -Nontender to palpation over all bony landmarks -Patient is pain-free with plantar and dorsiflexion; same with inversion/eversion -All of the above were 5/5 strength on resisted testing -The dorsiflexion of the ankle is limited to 15 actively; limited to 60 of plantarflexion; inversion and eversion each with 15-20 degrees. Range of motion are symmetric bilaterally  -There is a negative Thompson's test; negative talar tilt and negative anterior drawer     Neuro:  Alert and oriented, Speech clear.  Patient with a pill-rolling tremor of his bilateral hands/upper extremities, right side worse than left. There is no intention tremor present. He speaks fluently and clearly and is very insightful with regard to his medical history   Right Achilles tendon musculoskeletal ultrasound: MSK ultrasound of the right Achilles tendon was performed.  Images were obtained in both long and short view.  At 6 cm proximal to the calcaneal insertion, the Achilles tendon measured 2.91 cm in a transverse view (diameter) and was 1.7 cm in AP diameter At 2 cm proximal to the calcaneal insertion the AP diameter measured 0.99 cm and the transverse view demonstrated a diameter of 2.08 There was significant neovascularization in both the transverse and longitudinal views at the site of hypoechoic changes demonstrating partial tears with neovascularization/evidence of healing Most prominent hypoechoic change concerning for partial tear was seen in the inferior most portion of the Achilles tendon and  both short and long views, most prominent at approximately 4 cm from the insertion  The medial and lateral gastrocnemius and soleus are all normal-appearing on  limited exam. The plantars tendon was intact.     Assessment & Plan:   64 year old male with significant past medical history including Parkinson's disease who is following up for a RIGHT sided Achilles tendinopathy with partial tears. He's had a excellent response, now with 5/5 strength and mostly pain-free with the addition of heel lifts  Plan: -Continue with establishment of formal physical therapy as detailed above -Patient provided with a third set of 5/16 inch heel lift to add to other shoes so that he is always in shoes with unloading/decrease pressure on the RIGHT Achilles tendon -No indication for topical nitroglycerin at this time given neovascularization on ultrasound -Follow up in 6-8 weeks for assessment of response to physical therapy and also repeat scan to assess for serial improvement

## 2015-01-08 ENCOUNTER — Encounter: Payer: Self-pay | Admitting: Sports Medicine

## 2015-01-08 ENCOUNTER — Ambulatory Visit (INDEPENDENT_AMBULATORY_CARE_PROVIDER_SITE_OTHER): Payer: 59 | Admitting: Sports Medicine

## 2015-01-08 VITALS — BP 140/80 | Ht 69.0 in | Wt 260.0 lb

## 2015-01-08 DIAGNOSIS — M7661 Achilles tendinitis, right leg: Secondary | ICD-10-CM

## 2015-01-08 NOTE — Progress Notes (Signed)
Subjective:     Patient ID: Isaiah Pena, male   DOB: 1950-09-14, 64 y.o.   MRN: [id number]  HPI Isaiah Pena is a 64yo male presenting today for follow up of Achilles tendinopathy with partial tear. - Reports significant improvement in pain - Rates pain as 1/10, located at insertion of right Achilles - No longer requires cane to ambulate - No longer takes any pain medication for Achilles - Has been attempting to contact physical therapy. Hopeful he can initiate over the next several weeks.  Review of Systems Per HPI    Objective:   Physical Exam General: 63yo male resting comfortably in no apparent distress Cardiac: Lower extremities warm and well perfused Resp: No increased work of breathing noted Musc: Ankles symmetric without signs of effusion, ROM of ankles intact bilaterally and no pain noted with movement, muscle strength 5/5 in lower extremity, mild tenderness over insertion of right Achilles reported as much improved from last exam, Anterior drawer and Talar tilt symmetric bilaterally, negative Thompsons. There is still appreciable swelling of the midportion of the right Achilles tendon.  Ultrasound Right Achilles: Continued thickness of Achilles noted with partial tear noted     Assessment and Plan:     # Achilles Tendinopathy with Partial Tear Much improved from last visit. Has been wearing inserts, but has not been able to establish at physical therapy.  Plan: - Continue to attempt to establish at physical therapy - Continue use of inserts - Follow up as needed     Patient seen and evaluated with the resident. I agree with the above plan of care. Patient is doing very well. Minimal pain. I would still like for him to work some with physical therapy. Although his ultrasound does continue to show a fairly thickened midportion of the Achilles tendon I think this will likely be chronic. As long as his pain is minimal then I think he can follow-up as needed. I did provide him  with the name and contact information of the company that we use that provides [place] with our heel lifts ([employer]). He may order those directly from them as needed.

## 2015-02-02 ENCOUNTER — Other Ambulatory Visit: Payer: Self-pay | Admitting: Cardiology

## 2015-02-26 ENCOUNTER — Other Ambulatory Visit: Payer: Self-pay | Admitting: Cardiology

## 2015-02-26 ENCOUNTER — Ambulatory Visit: Payer: Self-pay

## 2015-03-26 ENCOUNTER — Other Ambulatory Visit: Payer: Self-pay | Admitting: Cardiology

## 2015-04-27 ENCOUNTER — Ambulatory Visit (INDEPENDENT_AMBULATORY_CARE_PROVIDER_SITE_OTHER): Payer: Self-pay | Admitting: Cardiology

## 2015-04-27 ENCOUNTER — Encounter: Payer: Self-pay | Admitting: Cardiology

## 2015-04-27 VITALS — BP 130/90 | HR 60 | Ht 70.0 in | Wt 274.0 lb

## 2015-04-27 DIAGNOSIS — I2510 Atherosclerotic heart disease of native coronary artery without angina pectoris: Secondary | ICD-10-CM

## 2015-04-27 DIAGNOSIS — E785 Hyperlipidemia, unspecified: Secondary | ICD-10-CM

## 2015-04-27 DIAGNOSIS — I10 Essential (primary) hypertension: Secondary | ICD-10-CM

## 2015-04-27 DIAGNOSIS — G4733 Obstructive sleep apnea (adult) (pediatric): Secondary | ICD-10-CM

## 2015-04-27 DIAGNOSIS — I480 Paroxysmal atrial fibrillation: Secondary | ICD-10-CM

## 2015-04-27 DIAGNOSIS — I2583 Coronary atherosclerosis due to lipid rich plaque: Principal | ICD-10-CM

## 2015-04-27 DIAGNOSIS — R0789 Other chest pain: Secondary | ICD-10-CM

## 2015-04-27 LAB — BASIC METABOLIC PANEL
BUN: 17 mg/dL (ref 7–25)
CALCIUM: 8.8 mg/dL (ref 8.6–10.3)
CO2: 30 mmol/L (ref 20–31)
Chloride: 103 mmol/L (ref 98–110)
Creat: 0.76 mg/dL (ref 0.70–1.25)
GLUCOSE: 100 mg/dL — AB (ref 65–99)
Potassium: 3.5 mmol/L (ref 3.5–5.3)
Sodium: 142 mmol/L (ref 135–146)

## 2015-04-27 LAB — HEPATIC FUNCTION PANEL
ALK PHOS: 63 U/L (ref 40–115)
ALT: 6 U/L — ABNORMAL LOW (ref 9–46)
AST: 11 U/L (ref 10–35)
Albumin: 3.9 g/dL (ref 3.6–5.1)
BILIRUBIN DIRECT: 0.1 mg/dL (ref <=0.2)
BILIRUBIN TOTAL: 0.8 mg/dL (ref 0.2–1.2)
Indirect Bilirubin: 0.7 mg/dL (ref 0.2–1.2)
Total Protein: 6.1 g/dL (ref 6.1–8.1)

## 2015-04-27 LAB — LIPID PANEL
CHOLESTEROL: 121 mg/dL — AB (ref 125–200)
HDL: 42 mg/dL (ref >=40)
LDL CALC: 64 mg/dL (ref <130)
Total CHOL/HDL Ratio: 2.9 Ratio (ref <=5.0)
Triglycerides: 73 mg/dL (ref <150)
VLDL: 15 mg/dL (ref <30)

## 2015-04-27 MED ORDER — ASPIRIN EC 81 MG PO TBEC: 81.0000 mg | DELAYED_RELEASE_TABLET | Freq: Every day | ORAL | Status: DC

## 2015-04-27 NOTE — Patient Instructions (Signed)
Medication Instructions:  1) DECREASE your Aspirin to 81mg    Labwork: Lipids, LFTs, and BMET today  Testing/Procedures: Your physician has requested that you have a lexiscan myoview. For further information please visit [email or web address]. Please follow instruction sheet, as given.    Follow-Up: Your physician wants you to follow-up in: 6 months with Dr. Radford Pax. You will receive a reminder letter in the mail two months in advance. If you don't receive a letter, please call our office to schedule the follow-up appointment.   Any Other Special Instructions Will Be Listed Below (If Applicable).     If you need a refill on your cardiac medications before your next appointment, please call your pharmacy.

## 2015-04-27 NOTE — Progress Notes (Signed)
Cardiology Office Note    Date:  04/27/2015   ID:  Isaiah Pena, DOB [birth year], MRN [id number]  PCP:  Isaiah Neer, MD  Cardiologist:  Isaiah Margarita, MD   Chief Complaint  Patient presents with  . Coronary Artery Disease  . Hypertension    History of Present Illness:  ABDIFATAH Pena is a 65 y.o. male with a history of CAD/HTN/lipids/PAF and OSA who presents today for followup. He is doing well. He says that he gets a little chest pain when he gets very anxious.  It is not like what he had with his stent.  He gets very anxious with his Parkinson's disease and thinks it is related to his anxiety. He describes it as a general tightness.  He denies any SOB, DOE, palpitations or syncope. He has had some problems with dizziness with sudden movements.  He has some balance issues as well due to his Parkinson's.  He has some mild LE edema as well. He is tolerating his BiPAP well. He feels rested in the am and has no daytime sleepiness. He tolerates his mask and pressure well. He continues to feel rested in the am and has no daytime sleepiness if he is able to sleep 4-6 hours at night.  He has problems with mouth dryness due to his meds.         Past Medical History  Diagnosis Date  . Sleep apnea     BIPAP  . Central serous retinopathy     LEFT EYE--PT IS LEGALLY BLIND IN LEFT EYE--WAS BEING TREATED WITH METHOTREXATE BUT TREATMENT DID NOT HELP--PT SEES SPECIALIST AT [facility]  . Hypertension   . GERD (gastroesophageal reflux disease)   . Kidney stone     HX OF STONE-PASSED  . History of torn meniscus of right knee   . Edema extremities     CHRONIC  . Hyperlipidemia   . BPH (benign prostatic hyperplasia)   . Neuromuscular disorder ([place])     PARKINSON'S DISEASE--HAS WEAKNESS OF EXTREMITIES AND BALANCE ISSUES.  DR. Ronny Flurry WITH [facility] HAS PT ON MIRAPEX AT PRESENT TIME  . Coronary artery disease 2004    s/p PCI of the RCA  . Erectile dysfunction   . PAF (paroxysmal atrial  fibrillation) Isaiah Pena)     Past Surgical History  Procedure Laterality Date  . Hernia repair      AGE 42  . Large kidney cyst drained-no surgery    . Colonoscopy with anesthesia to remove polyp    . Knee arthroscopy  03/21/2011    Procedure: ARTHROSCOPY KNEE;  Surgeon: Tobi Bastos, MD;  Location: [facility];  Service: Orthopedics;  Laterality: Right;    Current Medications: Outpatient Prescriptions Prior to Visit  Medication Sig Dispense Refill  . Amantadine HCl 100 MG tablet Take 1 tablet by mouth 2 (two) times daily.    Marland Kitchen amLODipine-benazepril (LOTREL) 10-40 MG per capsule TAKE CAPSULE CAPSULE BY MOUTH ONCE DAILY 7 capsule 0  . aspirin 325 MG EC tablet Take 325 mg by mouth daily.    Marland Kitchen atorvastatin (LIPITOR) 10 MG tablet ALTERNATING TAKING 1/2 TABLET BY MOUTH ONE DAY AND THEN 1 TABLET BY MOUTH THE NEXT 30 tablet 0  . AZILECT 1 MG TABS tablet Take 1 tablet by mouth daily.    . carbidopa-levodopa (SINEMET IR) 10-100 MG per tablet Take 1 tablet by mouth 3 (three) times daily. Take 1 and 1/2 tablet three times a day    . carbidopa-levodopa (SINEMET IR) 25-100  MG tablet   11  . cholecalciferol (VITAMIN D) 1000 UNITS tablet Take 1,000 Units by mouth daily.    Marland Kitchen co-enzyme Q-10 30 MG capsule Take 1,000 mg by mouth every morning.     . Docosahexaenoic Acid (DHA PO) Take 1,000 mg by mouth daily.    . entacapone (COMTAN) 200 MG tablet TK 1 T PO TID WITH CARBIDOPA/LEVODOPA  0  . ezetimibe (ZETIA) 10 MG tablet Take 10 mg by mouth every morning.    . fish oil-omega-3 fatty acids 1000 MG capsule Take 1 g by mouth every morning.     . hydrochlorothiazide (HYDRODIURIL) 25 MG tablet Take 25 mg by mouth every other day.     . metoprolol (LOPRESSOR) 50 MG tablet Take 50 mg by mouth 2 (two) times daily.    . Multiple Vitamin (MULITIVITAMIN WITH MINERALS) TABS Take 1 tablet by mouth every morning.    Marland Kitchen omeprazole (PRILOSEC) 20 MG capsule Take 20 mg by mouth daily.    Marland Kitchen oxybutynin (DITROPAN) 5 MG tablet Take 5  mg by mouth 3 (three) times daily.     . Probiotic Product (PROBIOTIC DAILY PO) Take by mouth daily.    . tadalafil (CIALIS) 5 MG tablet Take 5 mg by mouth daily.    . Tamsulosin HCl (FLOMAX) 0.4 MG CAPS Take 0.4 mg by mouth daily after breakfast.    . triamcinolone cream (KENALOG) 0.1 % 1 application to affected area     No facility-administered medications prior to visit.     Allergies:   Sulfa antibiotics   Social History   Social History  . Marital Status: Married    Spouse Name: N/A  . Number of Children: N/A  . Years of Education: N/A   Social History Main Topics  . Smoking status: Never Smoker   . Smokeless tobacco: Never Used     Comment: QUIT CIGARS 2009  . Alcohol Use: No  . Drug Use: No  . Sexual Activity: Not Asked   Other Topics Concern  . None   Social History Narrative     Family History:  The patient's family history is not on file.   ROS:   Please see the history of present illness.    ROS All other systems reviewed and are negative.   PHYSICAL EXAM:   VS:  BP 130/90 mmHg  Pulse 60  Ht 5\' 10"  (1.778 m)  Wt 274 lb (124.286 kg)  BMI 39.32 kg/m2   GEN: Well nourished, well developed, in no acute distress HEENT: normal Neck: no JVD, carotid bruits, or masses Cardiac: RRR; no murmurs, rubs, or gallops,no edema.  Intact distal pulses bilaterally.  Respiratory:  clear to auscultation bilaterally, normal work of breathing GI: soft, nontender, nondistended, + BS MS: no deformity or atrophy Skin: warm and dry, no rash Neuro:  Alert and Oriented x 3, Strength and sensation are intact Psych: euthymic mood, full affect  Wt Readings from Last 3 Encounters:  04/27/15 274 lb (124.286 kg)  01/08/15 260 lb (117.935 kg)  11/27/14 262 lb (118.842 kg)      Studies/Labs Reviewed:   EKG:  EKG was ordered today and showed NSR at 60 bpm with septal infarct and no ST changes.  Recent Labs: No results found for requested labs within last 365 days.   Lipid  Panel    Component Value Date/Time   CHOL 100 11/01/2012 1303   TRIG 48.0 11/01/2012 1303   HDL 44.20 11/01/2012 1303   CHOLHDL 2  11/01/2012 1303   VLDL 9.6 11/01/2012 1303   LDLCALC 46 11/01/2012 1303    Additional studies/ records that were reviewed today include:  None    ASSESSMENT:    1. Coronary artery disease due to lipid rich plaque   2. PAF (paroxysmal atrial fibrillation) ([place])   3. Essential hypertension   4. OSA (obstructive sleep apnea)   5. Hyperlipidemia      PLAN:  In order of problems listed above:  1. CAD s/p remote PCI of the RCA with some episodes of chest pressure.  His last stress test was in 2012.  He will continue on ASA/statin/BB.  Decrease ASA to 81mg  daily.  I will get a Lexiscan myoview to rule out ischemia.   2. PAF - maintaining NSR on BB.  He is currently not on anticoagulation.  His CHADS2VASC score is 2.  His episode of PAF was 12 years ago and he has not had any problems since then.  Given his Parkinson's and unsteady gait, would recommend no anticoagulation at this time unless he has a reoccurrence.   3. HTN - borderline controlled on current medical regimen.  Continue BB/Lotrel/HCTZ.  Check BMET. 4. OSA - he is doing well with his device.  His d/l today showed an AHI of 4.9/hr on BiPAP at 14/10cm H2O.  His will continue on current CPAP settings.  Patient has been using and benefiting from CPAP use and will continue to benefit from therapy.  5. Hyperlipidemia - continue statin and zetia.  Check FLP and ALT.    Followup with me in 6 months.  Medication Adjustments/Labs and Tests Ordered: Current medicines are reviewed at length with the patient today.  Concerns regarding medicines are outlined above.  Medication changes, Labs and Tests ordered today are listed in the Patient Instructions below. There are no Patient Instructions on file for this visit.   Lurena Nida, MD  04/27/2015 8:40 AM    [facility]  [facility] [place], [place], [place]  [address] Phone: [phone]; Fax: [phone]

## 2015-04-30 ENCOUNTER — Telehealth: Payer: Self-pay

## 2015-04-30 DIAGNOSIS — I10 Essential (primary) hypertension: Secondary | ICD-10-CM

## 2015-04-30 MED ORDER — POTASSIUM CHLORIDE CRYS ER 20 MEQ PO TBCR: 20.0000 meq | EXTENDED_RELEASE_TABLET | Freq: Every day | ORAL | Status: DC

## 2015-04-30 NOTE — Telephone Encounter (Signed)
Informed patient of results and verbal understanding expressed.  Instructed patient to START KDUR 20 meq daily. Repeat BMET scheduled 4/17. Patient agrees with treatment plan.

## 2015-04-30 NOTE — Telephone Encounter (Signed)
-----   Message from Sueanne Margarita, MD sent at 04/27/2015  4:06 PM EDT ----- Potassium low normal - add Kdur 31meq daily and repeat BMET in 1 week

## 2015-05-02 ENCOUNTER — Telehealth (HOSPITAL_COMMUNITY): Payer: Self-pay | Admitting: *Deleted

## 2015-05-02 NOTE — Telephone Encounter (Signed)
Left message on voicemail in reference to upcoming appointment scheduled for 05/07/15. Phone number given for a call back so details instructions can be given. Hubbard Robinson, RN

## 2015-05-04 ENCOUNTER — Telehealth (HOSPITAL_COMMUNITY): Payer: Self-pay | Admitting: *Deleted

## 2015-05-04 NOTE — Telephone Encounter (Signed)
Patient given detailed instructions per Myocardial Perfusion Study Information Sheet for the test on 05/07/15 at 9:45. Patient notified to arrive 15 minutes early and that it is imperative to arrive on time for appointment to keep from having the test rescheduled.  If you need to cancel or reschedule your appointment, please call the office within 24 hours of your appointment. Failure to do so may result in a cancellation of your appointment, and a $50 no show fee. Patient verbalized understanding. Isaiah Pena

## 2015-05-07 ENCOUNTER — Other Ambulatory Visit (INDEPENDENT_AMBULATORY_CARE_PROVIDER_SITE_OTHER): Payer: BLUE CROSS/BLUE SHIELD | Admitting: *Deleted

## 2015-05-07 ENCOUNTER — Ambulatory Visit (HOSPITAL_COMMUNITY): Payer: BLUE CROSS/BLUE SHIELD | Attending: Cardiology

## 2015-05-07 DIAGNOSIS — I10 Essential (primary) hypertension: Secondary | ICD-10-CM | POA: Diagnosis not present

## 2015-05-07 DIAGNOSIS — R42 Dizziness and giddiness: Secondary | ICD-10-CM | POA: Insufficient documentation

## 2015-05-07 DIAGNOSIS — R0789 Other chest pain: Secondary | ICD-10-CM | POA: Insufficient documentation

## 2015-05-07 LAB — BASIC METABOLIC PANEL
BUN: 23 mg/dL (ref 7–25)
CHLORIDE: 106 mmol/L (ref 98–110)
CO2: 24 mmol/L (ref 20–31)
Calcium: 8.8 mg/dL (ref 8.6–10.3)
Creat: 0.7 mg/dL (ref 0.70–1.25)
GLUCOSE: 111 mg/dL — AB (ref 65–99)
Potassium: 5.1 mmol/L (ref 3.5–5.3)
SODIUM: 141 mmol/L (ref 135–146)

## 2015-05-07 LAB — MYOCARDIAL PERFUSION IMAGING
CHL CUP NUCLEAR SRS: 4
CHL CUP NUCLEAR SSS: 4
CSEPPHR: 78 {beats}/min
LHR: 0.31
LV dias vol: 163 mL (ref 62–150)
LVSYSVOL: 73 mL
NUC STRESS TID: 1.1
Rest HR: 52 {beats}/min
SDS: 0

## 2015-05-07 MED ORDER — TECHNETIUM TC 99M SESTAMIBI GENERIC - CARDIOLITE
30.6000 | Freq: Once | INTRAVENOUS | Status: AC | PRN
  Administered 2015-05-07: 31 via INTRAVENOUS

## 2015-05-07 MED ORDER — TECHNETIUM TC 99M SESTAMIBI GENERIC - CARDIOLITE
10.7000 | Freq: Once | INTRAVENOUS | Status: AC | PRN
  Administered 2015-05-07: 11 via INTRAVENOUS

## 2015-05-07 MED ORDER — REGADENOSON 0.4 MG/5ML IV SOLN
0.4000 mg | Freq: Once | INTRAVENOUS | Status: AC
  Administered 2015-05-07: 0.4 mg via INTRAVENOUS

## 2015-05-07 NOTE — Addendum Note (Signed)
Addended by: Eulis Foster on: 05/07/2015 09:27 AM   Modules accepted: Orders

## 2015-05-08 ENCOUNTER — Telehealth: Payer: Self-pay | Admitting: Cardiology

## 2015-05-08 DIAGNOSIS — I10 Essential (primary) hypertension: Secondary | ICD-10-CM

## 2015-05-08 MED ORDER — POTASSIUM CHLORIDE CRYS ER 10 MEQ PO TBCR: 10.0000 meq | EXTENDED_RELEASE_TABLET | Freq: Every day | ORAL | Status: DC

## 2015-05-08 NOTE — Telephone Encounter (Signed)
-----   Message from Sueanne Margarita, MD sent at 05/07/2015  9:50 PM EDT ----- Decrease Kdur to 68meq daily and repeat BMET in 1 week

## 2015-05-08 NOTE — Telephone Encounter (Signed)
Informed patient of stress test and lab results and verbal understanding expressed.   Instructed patient to DECREASE KDUR to 10 meq daily. BMET scheduled April 25. Patient agrees with treatment plan.

## 2015-05-08 NOTE — Telephone Encounter (Signed)
New message      Calling to get get stress test results

## 2015-05-10 ENCOUNTER — Encounter: Payer: Self-pay | Admitting: Cardiology

## 2015-05-15 ENCOUNTER — Other Ambulatory Visit: Payer: Self-pay | Admitting: *Deleted

## 2015-05-15 ENCOUNTER — Other Ambulatory Visit (INDEPENDENT_AMBULATORY_CARE_PROVIDER_SITE_OTHER): Payer: BLUE CROSS/BLUE SHIELD | Admitting: *Deleted

## 2015-05-15 DIAGNOSIS — I10 Essential (primary) hypertension: Secondary | ICD-10-CM | POA: Diagnosis not present

## 2015-05-15 DIAGNOSIS — E876 Hypokalemia: Secondary | ICD-10-CM

## 2015-05-15 LAB — BASIC METABOLIC PANEL WITH GFR
BUN: 16 mg/dL (ref 7–25)
CO2: 29 mmol/L (ref 20–31)
Calcium: 8.7 mg/dL (ref 8.6–10.3)
Chloride: 104 mmol/L (ref 98–110)
Creat: 0.74 mg/dL (ref 0.70–1.25)
Glucose, Bld: 90 mg/dL (ref 65–99)
Potassium: 3.4 mmol/L — ABNORMAL LOW (ref 3.5–5.3)
Sodium: 142 mmol/L (ref 135–146)

## 2015-05-16 ENCOUNTER — Other Ambulatory Visit: Payer: Self-pay | Admitting: Cardiology

## 2015-05-23 ENCOUNTER — Other Ambulatory Visit: Payer: BLUE CROSS/BLUE SHIELD

## 2015-11-05 ENCOUNTER — Encounter: Payer: Self-pay | Admitting: Cardiology

## 2015-11-05 ENCOUNTER — Ambulatory Visit (INDEPENDENT_AMBULATORY_CARE_PROVIDER_SITE_OTHER): Payer: BLUE CROSS/BLUE SHIELD | Admitting: Cardiology

## 2015-11-05 VITALS — BP 132/80 | HR 67 | Ht 69.0 in | Wt 278.0 lb

## 2015-11-05 DIAGNOSIS — I480 Paroxysmal atrial fibrillation: Secondary | ICD-10-CM | POA: Diagnosis not present

## 2015-11-05 DIAGNOSIS — G4733 Obstructive sleep apnea (adult) (pediatric): Secondary | ICD-10-CM

## 2015-11-05 DIAGNOSIS — I10 Essential (primary) hypertension: Secondary | ICD-10-CM

## 2015-11-05 DIAGNOSIS — I2510 Atherosclerotic heart disease of native coronary artery without angina pectoris: Secondary | ICD-10-CM

## 2015-11-05 DIAGNOSIS — E7800 Pure hypercholesterolemia, unspecified: Secondary | ICD-10-CM

## 2015-11-05 DIAGNOSIS — I481 Persistent atrial fibrillation: Secondary | ICD-10-CM | POA: Diagnosis not present

## 2015-11-05 DIAGNOSIS — I4819 Other persistent atrial fibrillation: Secondary | ICD-10-CM

## 2015-11-05 MED ORDER — RIVAROXABAN 20 MG PO TABS: 20.0000 mg | ORAL_TABLET | Freq: Every day | ORAL | 11 refills | Status: DC

## 2015-11-05 NOTE — Progress Notes (Signed)
Cardiology Office Note    Date:  11/05/2015   ID:  Isaiah Pena, DOB [birth year], MRN [id number]  PCP:  Isaiah Neer, MD  Cardiologist:  Isaiah Him, MD   Chief Complaint  Patient presents with  . Coronary Artery Disease  . Hypertension  . Sleep Apnea  . Hyperlipidemia    History of Present Illness:  Isaiah Pena is a 65 y.o. male with a history of CAD/HTN/lipids/PAF and OSA who presents today for followup. He is doing well.  He denies any chest pain or pressure, SOB, DOE (except with severe exertion), palpitations or syncope. He has some balance issues as well due to his Parkinson's.  He has some mild LE edema as well. He is tolerating his BiPAP well. He feels rested in the am and has no daytime sleepiness. He tolerates his mask and pressure well. He continues to feel rested in the am and has no daytime sleepiness if he is able to sleep 4-6 hours at night.  He has problems with mouth dryness due to his meds.       Past Medical History:  Diagnosis Date  . BPH (benign prostatic hyperplasia)   . Central serous retinopathy    LEFT EYE--PT IS LEGALLY BLIND IN LEFT EYE--WAS BEING TREATED WITH METHOTREXATE BUT TREATMENT DID NOT HELP--PT SEES SPECIALIST AT [facility]  . Coronary artery disease 2004   s/p PCI of the RCA  . Edema extremities    CHRONIC  . Erectile dysfunction   . GERD (gastroesophageal reflux disease)   . History of torn meniscus of right knee   . Hyperlipidemia   . Hypertension   . Kidney stone    HX OF STONE-PASSED  . Neuromuscular disorder ([place])    PARKINSON'S DISEASE--HAS WEAKNESS OF EXTREMITIES AND BALANCE ISSUES.  DR. Ronny Flurry WITH [facility] HAS PT ON MIRAPEX AT PRESENT TIME  . PAF (paroxysmal atrial fibrillation) ([facility])    only 1 episode in setting of significant caffeine intake in 2004 with no reoccurence.   . Sleep apnea    BIPAP    Past Surgical History:  Procedure Laterality Date  . COLONOSCOPY WITH ANESTHESIA TO REMOVE POLYP    . HERNIA  REPAIR     AGE 63  . KNEE ARTHROSCOPY  03/21/2011   Procedure: ARTHROSCOPY KNEE;  Surgeon: Tobi Bastos, MD;  Location: [facility];  Service: Orthopedics;  Laterality: Right;  . LARGE KIDNEY CYST DRAINED-NO SURGERY      Current Medications: Outpatient Medications Prior to Visit  Medication Sig Dispense Refill  . Amantadine HCl 100 MG tablet Take 1 tablet by mouth 2 (two) times daily.    Marland Kitchen amLODipine-benazepril (LOTREL) 10-40 MG per capsule TAKE CAPSULE CAPSULE BY MOUTH ONCE DAILY 7 capsule 0  . aspirin EC 81 MG tablet Take 1 tablet (81 mg total) by mouth daily. 90 tablet 3  . AZILECT 1 MG TABS tablet Take 1 tablet by mouth daily.    . cholecalciferol (VITAMIN D) 1000 UNITS tablet Take 1,000 Units by mouth daily.    Marland Kitchen co-enzyme Q-10 30 MG capsule Take 1,000 mg by mouth every morning.     . Docosahexaenoic Acid (DHA PO) Take 1,000 mg by mouth daily.    . entacapone (COMTAN) 200 MG tablet TK 1 T PO TID WITH CARBIDOPA/LEVODOPA  0  . ezetimibe (ZETIA) 10 MG tablet Take 10 mg by mouth every morning.    . fish oil-omega-3 fatty acids 1000 MG capsule Take 1 g by mouth every morning.     Marland Kitchen  hydrochlorothiazide (HYDRODIURIL) 25 MG tablet Take 25 mg by mouth every other day.     . metoprolol (LOPRESSOR) 50 MG tablet Take 50 mg by mouth 2 (two) times daily.    . Multiple Vitamin (MULITIVITAMIN WITH MINERALS) TABS Take 1 tablet by mouth every morning.    Marland Kitchen omeprazole (PRILOSEC) 20 MG capsule Take 20 mg by mouth daily.    Marland Kitchen oxybutynin (DITROPAN) 5 MG tablet Take 5 mg by mouth 3 (three) times daily.     . potassium chloride SA (K-DUR,KLOR-CON) 20 MEQ tablet Take 1 tablet (20 mEq total) by mouth daily.    . Probiotic Product (PROBIOTIC DAILY PO) Take by mouth daily.    . tadalafil (CIALIS) 5 MG tablet Take 5 mg by mouth daily.    . Tamsulosin HCl (FLOMAX) 0.4 MG CAPS Take 0.4 mg by mouth daily after breakfast.    . triamcinolone cream (KENALOG) 0.1 % 1 application to affected area    . atorvastatin  (LIPITOR) 10 MG tablet ALTERNATE TAKING 1/2 TABLET BY MOUTH ONE DAY AND 1 TABLET BY MOUTH THE NEXT DAY 30 tablet 11  . carbidopa-levodopa (SINEMET IR) 10-100 MG per tablet Take 1 tablet by mouth 3 (three) times daily. Take 1 and 1/2 tablet three times a day    . carbidopa-levodopa (SINEMET IR) 25-100 MG tablet   11   No facility-administered medications prior to visit.      Allergies:   Sulfa antibiotics   Social History   Social History  . Marital status: Married    Spouse name: N/A  . Number of children: N/A  . Years of education: N/A   Social History Main Topics  . Smoking status: Never Smoker  . Smokeless tobacco: Never Used     Comment: QUIT CIGARS 2009  . Alcohol use No  . Drug use: No  . Sexual activity: Not Asked   Other Topics Concern  . None   Social History Narrative  . None     Family History:  The patient's family history is not on file.   ROS:   Please see the history of present illness.    ROS All other systems reviewed and are negative.  No flowsheet data found.     PHYSICAL EXAM:   VS:  BP 132/80   Pulse 67   Ht 5\' 9"  (1.753 m)   Wt 278 lb (126.1 kg)   BMI 41.05 kg/m    GEN: Well nourished, well developed, in no acute distress  HEENT: normal  Neck: no JVD, carotid bruits, or masses Cardiac: RRR; no murmurs, rubs, or gallops,no edema.  Intact distal pulses bilaterally.  Respiratory:  clear to auscultation bilaterally, normal work of breathing GI: soft, nontender, nondistended, + BS MS: no deformity or atrophy  Skin: warm and dry, no rash Neuro:  Alert and Oriented x 3, Strength and sensation are intact Psych: euthymic mood, full affect  Wt Readings from Last 3 Encounters:  11/05/15 278 lb (126.1 kg)  04/27/15 274 lb (124.3 kg)  01/08/15 260 lb (117.9 kg)      Studies/Labs Reviewed:   EKG:  EKG is  ordered today and showed atrial fibrillation with PVCs and nonspecifc ST abnormality  Recent Labs: 04/27/2015: ALT 6 05/15/2015: BUN  16; Creat 0.74; Potassium 3.4; Sodium 142   Lipid Panel    Component Value Date/Time   CHOL 121 (L) 04/27/2015 0922   TRIG 73 04/27/2015 0922   HDL 42 04/27/2015 0922   CHOLHDL 2.9 04/27/2015  [id number]   VLDL 15 04/27/2015 0922   LDLCALC 64 04/27/2015 0922    Additional studies/ records that were reviewed today include:  none    ASSESSMENT:    1. Persistent atrial fibrillation ([place])   3. Essential hypertension   4. Coronary artery disease involving native coronary artery of native heart without angina pectoris   5. OSA (obstructive sleep apnea)   6. Pure hypercholesterolemia      PLAN:  In order of problems listed above:  1.  Persistent atrial fibrilaltion-he is now back in atrial fibrilaltion rate controlled.  He is completely asymptomatic.  We discussed rhythm vs. Rate control and have decided to pursue rate contro.   Continue BB.  Start Xarelto 20mg  daily.   2.  HTN - BP controlled on current meds.  Continue Lotrel/diuretic and BB. 3.  ASCAD s/p remote PCI of the RCA with no angina.  Continue statin and BB.  Stop ASA as we are starting Xarelto.   4.  OSA - the patient is tolerating PAP therapy well without any problems. The patient has been using and benefiting from CPAP use and will continue to benefit from therapy. I will get a download from his DME.  5.  Hyperlipidemia with LDL goal < 70.  Continue  Statin and Zetia.      Medication Adjustments/Labs and Tests Ordered: Current medicines are reviewed at length with the patient today.  Concerns regarding medicines are outlined above.  Medication changes, Labs and Tests ordered today are listed in the Patient Instructions below.  There are no Patient Instructions on file for this visit.   Signed, Isaiah Him, MD  11/05/2015 11:01 AM    [facility] [place], [place], [place]  [address] Phone: [phone]; Fax: [phone]

## 2015-11-05 NOTE — Patient Instructions (Signed)
Medication Instructions:  1) STOP ASPIRIN 2) START XARELTO 20 mg daily  Labwork: Your physician recommends that you return for FASTING lab work.   Testing/Procedures: None  Follow-Up: Your physician wants you to follow-up in: 6 months with Dr. Mallie Snooks will receive a reminder letter in the mail two months in advance. If you don't receive a letter, please call our office to schedule the follow-up appointment.   Any Other Special Instructions Will Be Listed Below (If Applicable).     If you need a refill on your cardiac medications before your next appointment, please call your pharmacy.

## 2015-11-09 ENCOUNTER — Other Ambulatory Visit: Payer: BLUE CROSS/BLUE SHIELD | Admitting: *Deleted

## 2015-11-09 DIAGNOSIS — E7800 Pure hypercholesterolemia, unspecified: Secondary | ICD-10-CM

## 2015-11-09 DIAGNOSIS — E876 Hypokalemia: Secondary | ICD-10-CM

## 2015-11-09 LAB — BASIC METABOLIC PANEL
BUN: 19 mg/dL (ref 7–25)
CALCIUM: 8.9 mg/dL (ref 8.6–10.3)
CO2: 30 mmol/L (ref 20–31)
Chloride: 106 mmol/L (ref 98–110)
Creat: 0.86 mg/dL (ref 0.70–1.25)
Glucose, Bld: 106 mg/dL — ABNORMAL HIGH (ref 65–99)
Potassium: 3.5 mmol/L (ref 3.5–5.3)
SODIUM: 144 mmol/L (ref 135–146)

## 2015-11-09 LAB — HEPATIC FUNCTION PANEL
ALK PHOS: 68 U/L (ref 40–115)
ALT: 18 U/L (ref 9–46)
AST: 14 U/L (ref 10–35)
Albumin: 4 g/dL (ref 3.6–5.1)
BILIRUBIN DIRECT: 0.2 mg/dL (ref <=0.2)
BILIRUBIN INDIRECT: 0.6 mg/dL (ref 0.2–1.2)
BILIRUBIN TOTAL: 0.8 mg/dL (ref 0.2–1.2)
TOTAL PROTEIN: 6.3 g/dL (ref 6.1–8.1)

## 2015-11-09 LAB — LIPID PANEL
CHOL/HDL RATIO: 2.4 ratio (ref <=5.0)
Cholesterol: 106 mg/dL — ABNORMAL LOW (ref 125–200)
HDL: 45 mg/dL (ref >=40)
LDL CALC: 45 mg/dL (ref <130)
TRIGLYCERIDES: 81 mg/dL (ref <150)
VLDL: 16 mg/dL (ref <30)

## 2016-01-16 ENCOUNTER — Other Ambulatory Visit: Payer: Self-pay | Admitting: Urology

## 2016-01-22 ENCOUNTER — Telehealth: Payer: Self-pay | Admitting: Cardiology

## 2016-01-22 ENCOUNTER — Encounter (HOSPITAL_BASED_OUTPATIENT_CLINIC_OR_DEPARTMENT_OTHER): Payer: Self-pay | Admitting: *Deleted

## 2016-01-22 NOTE — Telephone Encounter (Signed)
Informed patient that clearance request will need to be sent from doctor performing procedure.  He was grateful for call.

## 2016-01-22 NOTE — Telephone Encounter (Signed)
New Messages       Has questions on surgery for 01/28/16 on new medication, can he stop the Xarelto until after surgery

## 2016-01-23 ENCOUNTER — Encounter (HOSPITAL_BASED_OUTPATIENT_CLINIC_OR_DEPARTMENT_OTHER): Payer: Self-pay | Admitting: *Deleted

## 2016-01-23 NOTE — Progress Notes (Signed)
NPO AFTER MN.  ARRIVE AT 0730.  NEEDS ISTAT 8.  CURRENT EKG IN CHART AND EPIC.  WILL TAKE AM MEDS DOS W/ SIPS OF WATER WITH EXCEPTION NO LOTREL/ HCTZ.  OFFICE JUST FAXED REQUEST TO  DR Fransico Him FOR CLEARANCE TO STOP XARELTO YESTERDAY, 01-22-2016.  PT VERBALIZED UNDERSTANDING WILL RECEIVE CALL FROM PRE-OP NURSE TO GIVE INSTRUCTIONS WHEN TO STOP XARELTO IF OK'D BY DR TURNER.

## 2016-01-24 ENCOUNTER — Telehealth: Payer: Self-pay | Admitting: Cardiology

## 2016-01-24 NOTE — Telephone Encounter (Signed)
Clearance request received from [facility] (Dr. Diona Fanti) for Urolift procedure to be performed 01/28/16. They request Xarelto instructions for procedure.  Xarelto instructions and last OV note to be faxed to: [phone]

## 2016-01-24 NOTE — Telephone Encounter (Signed)
Follow Up:    Isaiah Pena is calling to see if you received fax that was sent over on 01-22-16. She needs this back asap,pt scheduled for surgery on Monday 01-28-16.

## 2016-01-24 NOTE — Telephone Encounter (Signed)
Confirmed with Marlowe Kays that Xarelto clearance instructions and last office note have been faxed.  She was grateful for call.

## 2016-01-24 NOTE — Telephone Encounter (Signed)
Pt takes Xarelto for afib with CHADS2 score of 1 (HTN), renal function is ifne. [place] for pt to hold Xarelto for 24 hours prior to procedure.

## 2016-01-24 NOTE — Progress Notes (Signed)
Spoke with Isaiah Pena at [facility] and xarelto instructions received.Patient called and instructed to stop xarelto 24 hours prior to procedure.

## 2016-01-28 ENCOUNTER — Encounter (HOSPITAL_BASED_OUTPATIENT_CLINIC_OR_DEPARTMENT_OTHER): Payer: Self-pay

## 2016-01-28 ENCOUNTER — Ambulatory Visit (HOSPITAL_BASED_OUTPATIENT_CLINIC_OR_DEPARTMENT_OTHER): Payer: Medicare Other | Admitting: Anesthesiology

## 2016-01-28 ENCOUNTER — Encounter (HOSPITAL_BASED_OUTPATIENT_CLINIC_OR_DEPARTMENT_OTHER)
Admission: RE | Disposition: A | Discharge status: Home / Self Care | Payer: Self-pay | Source: Ambulatory Visit | Attending: Urology

## 2016-01-28 ENCOUNTER — Ambulatory Visit (HOSPITAL_BASED_OUTPATIENT_CLINIC_OR_DEPARTMENT_OTHER)
Admission: RE | Admit: 2016-01-28 | Discharge: 2016-01-28 | Disposition: A | Discharge status: Home / Self Care | Payer: Medicare Other | Source: Ambulatory Visit | Attending: Urology | Admitting: Urology

## 2016-01-28 DIAGNOSIS — Z6841 Body Mass Index (BMI) 40.0 and over, adult: Secondary | ICD-10-CM | POA: Diagnosis not present

## 2016-01-28 DIAGNOSIS — G4733 Obstructive sleep apnea (adult) (pediatric): Secondary | ICD-10-CM | POA: Insufficient documentation

## 2016-01-28 DIAGNOSIS — G20 Parkinson's disease: Secondary | ICD-10-CM | POA: Insufficient documentation

## 2016-01-28 DIAGNOSIS — I2510 Atherosclerotic heart disease of native coronary artery without angina pectoris: Secondary | ICD-10-CM | POA: Diagnosis not present

## 2016-01-28 DIAGNOSIS — N138 Other obstructive and reflux uropathy: Secondary | ICD-10-CM | POA: Diagnosis not present

## 2016-01-28 DIAGNOSIS — N400 Enlarged prostate without lower urinary tract symptoms: Secondary | ICD-10-CM | POA: Diagnosis not present

## 2016-01-28 DIAGNOSIS — Z7901 Long term (current) use of anticoagulants: Secondary | ICD-10-CM | POA: Insufficient documentation

## 2016-01-28 DIAGNOSIS — I10 Essential (primary) hypertension: Secondary | ICD-10-CM | POA: Diagnosis not present

## 2016-01-28 DIAGNOSIS — R3915 Urgency of urination: Secondary | ICD-10-CM | POA: Diagnosis not present

## 2016-01-28 DIAGNOSIS — E785 Hyperlipidemia, unspecified: Secondary | ICD-10-CM | POA: Insufficient documentation

## 2016-01-28 DIAGNOSIS — K219 Gastro-esophageal reflux disease without esophagitis: Secondary | ICD-10-CM | POA: Insufficient documentation

## 2016-01-28 DIAGNOSIS — N401 Enlarged prostate with lower urinary tract symptoms: Secondary | ICD-10-CM | POA: Diagnosis not present

## 2016-01-28 DIAGNOSIS — Z955 Presence of coronary angioplasty implant and graft: Secondary | ICD-10-CM | POA: Insufficient documentation

## 2016-01-28 DIAGNOSIS — I481 Persistent atrial fibrillation: Secondary | ICD-10-CM | POA: Diagnosis not present

## 2016-01-28 HISTORY — DX: Presence of coronary angioplasty implant and graft: Z95.5

## 2016-01-28 HISTORY — DX: Parkinson's disease without dyskinesia, without mention of fluctuations: G20.A1

## 2016-01-28 HISTORY — DX: Personal history of colonic polyps: Z86.010

## 2016-01-28 HISTORY — DX: Personal history of urinary calculi: Z87.442

## 2016-01-28 HISTORY — DX: Parkinson's disease: G20

## 2016-01-28 HISTORY — DX: Obstructive sleep apnea (adult) (pediatric): G47.33

## 2016-01-28 HISTORY — DX: Personal history of other diseases of urinary system: Z87.448

## 2016-01-28 HISTORY — DX: Localized edema: R60.0

## 2016-01-28 HISTORY — DX: Unspecified symptoms and signs involving the genitourinary system: R39.9

## 2016-01-28 HISTORY — DX: Personal history of colon polyps, unspecified: Z86.0100

## 2016-01-28 HISTORY — PX: CYSTOSCOPY WITH INSERTION OF UROLIFT: SHX6678

## 2016-01-28 HISTORY — DX: Long term (current) use of anticoagulants: Z79.01

## 2016-01-28 LAB — POCT I-STAT, CHEM 8
BUN: 16 mg/dL (ref 6–20)
CALCIUM ION: 1.04 mmol/L — AB (ref 1.15–1.40)
CHLORIDE: 105 mmol/L (ref 101–111)
CREATININE: 0.6 mg/dL — AB (ref 0.61–1.24)
GLUCOSE: 112 mg/dL — AB (ref 65–99)
HCT: 43 % (ref 39.0–52.0)
Hemoglobin: 14.6 g/dL (ref 13.0–17.0)
POTASSIUM: 3.4 mmol/L — AB (ref 3.5–5.1)
Sodium: 144 mmol/L (ref 135–145)
TCO2: 24 mmol/L (ref 0–100)

## 2016-01-28 SURGERY — CYSTOSCOPY WITH INSERTION OF UROLIFT
Anesthesia: General | Site: Bladder

## 2016-01-28 MED ORDER — FENTANYL CITRATE (PF) 100 MCG/2ML IJ SOLN
25.0000 ug | INTRAMUSCULAR | Status: DC | PRN
  Filled 2016-01-28: qty 1

## 2016-01-28 MED ORDER — KETOROLAC TROMETHAMINE 30 MG/ML IJ SOLN
30.0000 mg | Freq: Once | INTRAMUSCULAR | Status: DC | PRN
  Filled 2016-01-28: qty 1

## 2016-01-28 MED ORDER — FENTANYL CITRATE (PF) 100 MCG/2ML IJ SOLN
INTRAMUSCULAR | Status: DC | PRN
  Administered 2016-01-28: 50 ug via INTRAVENOUS

## 2016-01-28 MED ORDER — STERILE WATER FOR IRRIGATION IR SOLN
Status: DC | PRN
  Administered 2016-01-28 (×2): 3000 mL via INTRAVESICAL

## 2016-01-28 MED ORDER — CEFAZOLIN IN D5W 1 GM/50ML IV SOLN
1.0000 g | INTRAVENOUS | Status: DC
  Filled 2016-01-28: qty 50

## 2016-01-28 MED ORDER — PROMETHAZINE HCL 25 MG/ML IJ SOLN
6.2500 mg | INTRAMUSCULAR | Status: DC | PRN
  Filled 2016-01-28: qty 1

## 2016-01-28 MED ORDER — PROPOFOL 10 MG/ML IV BOLUS
INTRAVENOUS | Status: AC
  Filled 2016-01-28: qty 40

## 2016-01-28 MED ORDER — MIDAZOLAM HCL 5 MG/5ML IJ SOLN
INTRAMUSCULAR | Status: DC | PRN
  Administered 2016-01-28: 1 mg via INTRAVENOUS

## 2016-01-28 MED ORDER — LIDOCAINE 2% (20 MG/ML) 5 ML SYRINGE
INTRAMUSCULAR | Status: DC | PRN
  Administered 2016-01-28: 100 mg via INTRAVENOUS

## 2016-01-28 MED ORDER — MIDAZOLAM HCL 2 MG/2ML IJ SOLN
INTRAMUSCULAR | Status: AC
  Filled 2016-01-28: qty 2

## 2016-01-28 MED ORDER — LACTATED RINGERS IV SOLN
INTRAVENOUS | Status: DC
  Administered 2016-01-28: 09:00:00 via INTRAVENOUS
  Filled 2016-01-28: qty 1000

## 2016-01-28 MED ORDER — ONDANSETRON HCL 4 MG/2ML IJ SOLN
INTRAMUSCULAR | Status: AC
  Filled 2016-01-28: qty 2

## 2016-01-28 MED ORDER — ONDANSETRON HCL 4 MG/2ML IJ SOLN
INTRAMUSCULAR | Status: DC | PRN
Start: 2016-01-28 — End: 2016-01-28
  Administered 2016-01-28: 4 mg via INTRAVENOUS

## 2016-01-28 MED ORDER — CEFAZOLIN SODIUM 10 G IJ SOLR
3.0000 g | INTRAMUSCULAR | Status: AC
  Administered 2016-01-28: 3 g via INTRAVENOUS
  Filled 2016-01-28 (×2): qty 3000

## 2016-01-28 MED ORDER — LIDOCAINE 2% (20 MG/ML) 5 ML SYRINGE
INTRAMUSCULAR | Status: AC
  Filled 2016-01-28: qty 5

## 2016-01-28 MED ORDER — PROPOFOL 10 MG/ML IV BOLUS
INTRAVENOUS | Status: DC | PRN
  Administered 2016-01-28: 60 mg via INTRAVENOUS
  Administered 2016-01-28: 200 mg via INTRAVENOUS

## 2016-01-28 MED ORDER — FENTANYL CITRATE (PF) 100 MCG/2ML IJ SOLN
INTRAMUSCULAR | Status: AC
  Filled 2016-01-28: qty 2

## 2016-01-28 MED ORDER — CEPHALEXIN 500 MG PO CAPS: 500.0000 mg | ORAL_CAPSULE | Freq: Two times a day (BID) | ORAL | 0 refills | Status: DC

## 2016-01-28 MED ORDER — SUCCINYLCHOLINE CHLORIDE 200 MG/10ML IV SOSY
PREFILLED_SYRINGE | INTRAVENOUS | Status: AC
  Filled 2016-01-28: qty 10

## 2016-01-28 MED ORDER — DEXAMETHASONE SODIUM PHOSPHATE 10 MG/ML IJ SOLN
INTRAMUSCULAR | Status: AC
  Filled 2016-01-28: qty 1

## 2016-01-28 MED ORDER — DEXAMETHASONE SODIUM PHOSPHATE 4 MG/ML IJ SOLN
INTRAMUSCULAR | Status: DC | PRN
  Administered 2016-01-28: 10 mg via INTRAVENOUS

## 2016-01-28 SURGICAL SUPPLY — 12 items
BAG DRAIN URO-CYSTO SKYTR STRL (DRAIN) ×3 IMPLANT
BAG URINE LEG 500ML (DRAIN) ×3 IMPLANT
CATH FOLEY 2WAY SLVR  5CC 16FR (CATHETERS) ×2
CATH FOLEY 2WAY SLVR 5CC 16FR (CATHETERS) ×1 IMPLANT
CLOTH BEACON ORANGE TIMEOUT ST (SAFETY) ×3 IMPLANT
GLOVE BIO SURGEON STRL SZ8 (GLOVE) ×3 IMPLANT
GOWN STRL REUS W/TWL LRG LVL3 (GOWN DISPOSABLE) ×3 IMPLANT
GOWN STRL REUS W/TWL XL LVL3 (GOWN DISPOSABLE) ×3 IMPLANT
MANIFOLD NEPTUNE II (INSTRUMENTS) ×3 IMPLANT
PACK CYSTO (CUSTOM PROCEDURE TRAY) ×3 IMPLANT
SYSTEM UROLIFT (Male Continence) ×18 IMPLANT
WATER STERILE IRR 3000ML UROMA (IV SOLUTION) ×6 IMPLANT

## 2016-01-28 NOTE — Anesthesia Postprocedure Evaluation (Addendum)
Anesthesia Post Note  Patient: MARDARIUS DELBUONO  Procedure(s) Performed: Procedure(s) (LRB): CYSTOSCOPY WITH INSERTION OF UROLIFT (N/A)  Patient location during evaluation: PACU Anesthesia Type: General Level of consciousness: awake and alert Pain management: pain level controlled Vital Signs Assessment: post-procedure vital signs reviewed and stable Respiratory status: spontaneous breathing, nonlabored ventilation, respiratory function stable and patient connected to nasal cannula oxygen Cardiovascular status: blood pressure returned to baseline and stable Postop Assessment: no signs of nausea or vomiting Anesthetic complications: no       Last Vitals:  Vitals:   01/28/16 0756 01/28/16 1000  BP: 131/81 (!) 145/95  Pulse: (!) 56 (!) 47  Resp: 18 13  Temp: 36.4 C 36.8 C    Last Pain:  Vitals:   01/28/16 0756  TempSrc: Oral                 Daneka Lantigua S

## 2016-01-28 NOTE — Anesthesia Procedure Notes (Signed)
Procedure Name: LMA Insertion Date/Time: 01/28/2016 9:18 AM Performed by: Bethena Roys T Pre-anesthesia Checklist: Patient identified, Emergency Drugs available, Suction available and Patient being monitored Patient Re-evaluated:Patient Re-evaluated prior to inductionOxygen Delivery Method: Circle system utilized Preoxygenation: Pre-oxygenation with 100% oxygen Intubation Type: IV induction Ventilation: Mask ventilation without difficulty LMA: LMA inserted LMA Size: 5.0 Number of attempts: 1 Airway Equipment and Method: Bite block Placement Confirmation: positive ETCO2 Tube secured with: Tape Dental Injury: Teeth and Oropharynx as per pre-operative assessment

## 2016-01-28 NOTE — Op Note (Signed)
A 68F cystoscope was inserted into the bladder. Circumferential inspection of the bladderwas performed. This revealed moderate trabeculation of the bladder wall. There were no tumors or foreign bodies. Ureteral orifices were normal in configuration and location.  The cystoscopy bridge was replaced with a UroLift delivery device.The first treatment site was the patient's left side approximately 1.5cm distal to the bladder neck. The distal tip of the delivery device was then angled laterally approximately 20 degrees at this position to compress the lateral lobe. The trigger was pulled, thereby deploying a needle containing the implant through the prostate. The needle was then retracted, allowing one end of the implant to be delivered to the capsular surface of the prostate. The implant was then tensioned to assure capsular seating and removal of slack monofilament. The device was then angled back toward midline and slowly advanced proximally until cystoscopic verification of the monofilament being centered in the delivery bay. The urethral end piece was then affixed to the monofilament thereby tailoring the size of the implant. Excess filament was then severed. The delivery device was then re-advanced into the bladder. The delivery device was then replaced with cystoscope and bridge and the implant location and opening effect was confirmed cystoscopically. The same procedure was then repeated on the right side, and two additional implants were delivered just proximal to the veru montanum, again one on right and one on left side of the prostate, following the same technique. Because of the large glandular size, an additional 2 implants were placed midway between the more proximal and distal implants, both on the right and left side.   A final cystoscopy was conducted first to inspect the location and state of each implant and second, to confirm the presence of a continuous anterior channel was present  through the prostatic urethra with irrigation flow turned off. 6 Implants were delivered in total.  At this point, the bladder was drained.  A 16 French Foley catheter was placed.  The balloon was filled with 10 mL of water and hooked to leg bag.  The patient tolerated procedure well.  He was transferred to the PACU in stable condition.

## 2016-01-28 NOTE — Discharge Instructions (Signed)
1. You may see some blood in the urine and may have some burning with urination for 48-72 hours. You also may notice that you have to urinate more frequently or urgently after your procedure which is normal.  2. You should call should you develop an inability urinate, fever > 101, persistent nausea and vomiting that prevents you from eating or drinking to stay hydrated.  3. If you have a stent, you will likely urinate more frequently and urgently until the stent is removed and you may experience some discomfort/pain in the lower abdomen and flank especially when urinating. You may take pain medication prescribed to you if needed for pain. You may also intermittently have blood in the urine until the stent is removed. 4. If you have a catheter, you will be taught how to take care of the catheter by the nursing staff prior to discharge from the hospital.  You may periodically feel a strong urge to void with the catheter in place.  This is a bladder spasm and most often can occur when having a bowel movement or moving around. It is typically self-limited and usually will stop after a few minutes.  You may use some Vaseline or Neosporin around the tip of the catheter to reduce friction at the tip of the penis. You may also see some blood in the urine.  A very small amount of blood can make the urine look quite red.  As long as the catheter is draining well, there usually is not a problem.  However, if the catheter is not draining well and is bloody, you should call the office [phone]) to notify us.  Realize that there may be some blood coming from around the catheter-this is from placement of the implants.  Make sure you drink plenty of fluids to keep your urine clear.  If your urine is fairly clear.  By Tuesday morning, remove it as instructed.   Post Anesthesia Home Care Instructions  Activity: Get plenty of rest for the remainder of the day. A responsible adult should stay with you for 24 hours  following the procedure.  For the next 24 hours, DO NOT: -Drive a car -Paediatric nurse -Drink alcoholic beverages -Take any medication unless instructed by your physician -Make any legal decisions or sign important papers.  Meals: Start with liquid foods such as gelatin or soup. Progress to regular foods as tolerated. Avoid greasy, spicy, heavy foods. If nausea and/or vomiting occur, drink only clear liquids until the nausea and/or vomiting subsides. Call your physician if vomiting continues.  Special Instructions/Symptoms: Your throat may feel dry or sore from the anesthesia or the breathing tube placed in your throat during surgery. If this causes discomfort, gargle with warm salt water. The discomfort should disappear within 24 hours.  If you had a scopolamine patch placed behind your ear for the management of post- operative nausea and/or vomiting:  1. The medication in the patch is effective for 72 hours, after which it should be removed.  Wrap patch in a tissue and discard in the trash. Wash hands thoroughly with soap and water. 2. You may remove the patch earlier than 72 hours if you experience unpleasant side effects which may include dry mouth, dizziness or visual disturbances. 3. Avoid touching the patch. Wash your hands with soap and water after contact with the patch.

## 2016-01-28 NOTE — H&P (Signed)
Urology History and Physical Exam  CC: Large prostate with voiding symptoms  HPI: 66 year old male presents for Urolift procedure. He has significant voiding symptoms despite medical management. Additionally, he has been experiencing SE's from his medical therapy, which are bothersome, especially with his Parkinson's disease. His prostatic volume is 80 ml and cystoscopically he has an obstructive gland without a significant median lobe.  PMH: Past Medical History:  Diagnosis Date  . Anticoagulant long-term use    xarelto  . Bilateral lower extremity edema   . BPH (benign prostatic hyperplasia)   . BPH (benign prostatic hyperplasia)   . Central serous retinopathy    LOSS OF CENTER VISION LEFT EYE W/ BLURRED VISION-  was treated  methotrexate by specialist at [facility] and released  . Coronary artery disease cardiologist-  dr Tressia Miners turner   01/ 2004  abnormal cardiolite  s/p  cardiac cath w/ PCI and DES to dRCA  . Erectile dysfunction   . GERD (gastroesophageal reflux disease)   . History of colon polyps    2011 per pt benign  . History of kidney stones   . History of simple renal cyst    drained via radiology  . Hyperlipidemia   . Hypertension   . Lower urinary tract symptoms (LUTS)   . OSA treated with BiPAP    moderate per study 01-23-2002  . Parkinson's disease [facility])    neurologist-  dr ed hill ([facility])  . Persistent atrial fibrillation ([place])   . S/P drug eluting coronary stent placement    01/ 2004  x1 to dRCA    PSH: Past Surgical History:  Procedure Laterality Date  . CARDIOVASCULAR STRESS TEST  05-07-2015   dr Tressia Miners turner   normal nuclear study w/ no ischemia/  normal LV function and wall motion , stress ef 55%  . COLONOSCOPY WITH PROPOFOL  2011  . CORONARY ANGIOPLASTY WITH STENT PLACEMENT  02-11-2002  dr Daneen Schick   abnormal cardiolite--  PCI and DES to dRCA (99%),  pRCA luminal irregularities, mild to moderate LAD and CFX disease,  normal LVF  . HERNIA  REPAIR     AGE 59  . KNEE ARTHROSCOPY  03/21/2011   Procedure: ARTHROSCOPY KNEE;  Surgeon: Tobi Bastos, MD;  Location: [facility];  Service: Orthopedics;  Laterality: Right;    Allergies: Allergies  Allergen Reactions  . Sulfa Antibiotics Hives    Medications: No prescriptions prior to admission.     Social History: Social History   Social History  . Marital status: Married    Spouse name: N/A  . Number of children: N/A  . Years of education: N/A   Occupational History  . Not on file.   Social History Main Topics  . Smoking status: Never Smoker  . Smokeless tobacco: Never Used  . Alcohol use No  . Drug use: No  . Sexual activity: Not on file   Other Topics Concern  . Not on file   Social History Narrative  . No narrative on file    Family History: History reviewed. No pertinent family history.  Review of Systems: GU Review Male: Patient reports frequent urination, hard to postpone urination, get up at night to urinate, stream starts and stops, and erection problems. Patient denies burning/ pain with urination, leakage of urine, trouble starting your stream, have to strain to urinate , and penile pain. Gastrointestinal (Upper): Patient denies nausea, vomiting, and indigestion/ heartburn. Gastrointestinal (Lower): Patient denies diarrhea and constipation. Constitutional: Patient denies  fever, night sweats, weight loss, and fatigue. Skin: Patient denies skin rash/ lesion and itching. Eyes: Patient denies blurred vision and double vision. Ears/ Nose/ Throat: Patient denies sore throat and sinus problems. Hematologic/Lymphatic: Patient denies easy bruising and swollen glands. Cardiovascular: Patient denies leg swelling and chest pains. Respiratory: Patient denies cough and shortness of breath. Endocrine: Patient denies excessive thirst. Musculoskeletal: Patient denies back pain and joint pain. Neurological: Patient denies headaches and dizziness. Psychologic: Patient denies  depression and anxiety.                  Physical Exam: @VITALS2 @ General: No acute distress.  Awake. Head:  Normocephalic.  Atraumatic. ENT:  EOMI.  Mucous membranes moist Neck:  Supple.  No lymphadenopathy. CV:  S1 present. S2 present. Regular rate. Pulmonary: Equal effort bilaterally.  Clear to auscultation bilaterally. Abdomen: Soft.  Non tender to palpation. Skin:  Normal turgor.  No visible rash. Extremity: No gross deformity of bilateral upper extremities.  No gross deformity of                             lower extremities. Neurologic: Alert. Appropriate mood.    Studies:  No results for input(s): HGB, WBC, PLT in the last 72 hours.  No results for input(s): NA, K, CL, CO2, BUN, CREATININE, CALCIUM, GFRNONAA, GFRAA in the last 72 hours.  Invalid input(s): MAGNESIUM   No results for input(s): INR, APTT in the last 72 hours.  Invalid input(s): PT   Invalid input(s): ABG    Assessment:  BPH with obstruction  Plan: Urolift procedure

## 2016-01-28 NOTE — Anesthesia Preprocedure Evaluation (Addendum)
Anesthesia Evaluation  Patient identified by MRN, date of birth, ID band Patient awake    Reviewed: Allergy & Precautions, NPO status , Patient's Chart, lab work & pertinent test results  Airway Mallampati: III  TM Distance: <3 FB Neck ROM: Full    Dental no notable dental hx. (+) Dental Advisory Given   Pulmonary sleep apnea ,    Pulmonary exam normal breath sounds clear to auscultation       Cardiovascular hypertension, + CAD and + Cardiac Stents  + dysrhythmias Atrial Fibrillation  Rhythm:Irregular Rate:Normal     Neuro/Psych Parkinsons dz negative psych ROS   GI/Hepatic negative GI ROS, Neg liver ROS,   Endo/Other  Morbid obesity  Renal/GU negative Renal ROS  negative genitourinary   Musculoskeletal negative musculoskeletal ROS (+)   Abdominal   Peds negative pediatric ROS (+)  Hematology negative hematology ROS (+)   Anesthesia Other Findings   Reproductive/Obstetrics negative OB ROS                           Anesthesia Physical Anesthesia Plan  ASA: III  Anesthesia Plan: General   Post-op Pain Management:    Induction: Intravenous  Airway Management Planned: LMA and Oral ETT  Additional Equipment:   Intra-op Plan:   Post-operative Plan: Extubation in OR  Informed Consent: I have reviewed the patients History and Physical, chart, labs and discussed the procedure including the risks, benefits and alternatives for the proposed anesthesia with the patient or authorized representative who has indicated his/her understanding and acceptance.   Dental advisory given  Plan Discussed with: CRNA and Surgeon  Anesthesia Plan Comments:         Anesthesia Quick Evaluation

## 2016-01-28 NOTE — Transfer of Care (Signed)
Immediate Anesthesia Transfer of Care Note  Patient: Isaiah Pena  Procedure(s) Performed: Procedure(s): CYSTOSCOPY WITH INSERTION OF UROLIFT (N/A)  Patient Location: PACU  Anesthesia Type:General  Level of Consciousness: sedated and responds to stimulation  Airway & Oxygen Therapy: Patient Spontanous Breathing and Patient connected to nasal cannula oxygen  Post-op Assessment: Report given to RN  Post vital signs: Reviewed and stable  Last Vitals: 145/95, 106, 12, 97%, 98.2 Vitals:   01/28/16 0756  BP: 131/81  Pulse: (!) 56  Resp: 18  Temp: 36.4 C    Last Pain:  Vitals:   01/28/16 0756  TempSrc: Oral      Patients Stated Pain Goal: 5 ([id number])  Complications: No apparent anesthesia complications

## 2016-01-29 ENCOUNTER — Encounter (HOSPITAL_BASED_OUTPATIENT_CLINIC_OR_DEPARTMENT_OTHER): Payer: Self-pay | Admitting: Urology

## 2016-02-27 DIAGNOSIS — N3000 Acute cystitis without hematuria: Secondary | ICD-10-CM | POA: Diagnosis not present

## 2016-02-27 DIAGNOSIS — N401 Enlarged prostate with lower urinary tract symptoms: Secondary | ICD-10-CM | POA: Diagnosis not present

## 2016-02-27 DIAGNOSIS — R350 Frequency of micturition: Secondary | ICD-10-CM | POA: Diagnosis not present

## 2016-03-13 DIAGNOSIS — G214 Vascular parkinsonism: Secondary | ICD-10-CM | POA: Diagnosis not present

## 2016-03-13 DIAGNOSIS — F419 Anxiety disorder, unspecified: Secondary | ICD-10-CM | POA: Diagnosis not present

## 2016-03-13 DIAGNOSIS — R5381 Other malaise: Secondary | ICD-10-CM | POA: Diagnosis not present

## 2016-04-11 DIAGNOSIS — N3000 Acute cystitis without hematuria: Secondary | ICD-10-CM | POA: Diagnosis not present

## 2016-04-11 DIAGNOSIS — H6120 Impacted cerumen, unspecified ear: Secondary | ICD-10-CM | POA: Diagnosis not present

## 2016-04-11 DIAGNOSIS — H6690 Otitis media, unspecified, unspecified ear: Secondary | ICD-10-CM | POA: Diagnosis not present

## 2016-04-30 DIAGNOSIS — N3000 Acute cystitis without hematuria: Secondary | ICD-10-CM | POA: Diagnosis not present

## 2016-05-06 DIAGNOSIS — Z87891 Personal history of nicotine dependence: Secondary | ICD-10-CM | POA: Diagnosis not present

## 2016-05-06 DIAGNOSIS — T162XXA Foreign body in left ear, initial encounter: Secondary | ICD-10-CM | POA: Diagnosis not present

## 2016-05-08 ENCOUNTER — Other Ambulatory Visit: Payer: Self-pay | Admitting: Cardiology

## 2016-06-05 DIAGNOSIS — G214 Vascular parkinsonism: Secondary | ICD-10-CM | POA: Diagnosis not present

## 2016-06-05 DIAGNOSIS — F419 Anxiety disorder, unspecified: Secondary | ICD-10-CM | POA: Diagnosis not present

## 2016-06-05 DIAGNOSIS — R5381 Other malaise: Secondary | ICD-10-CM | POA: Diagnosis not present

## 2016-06-23 NOTE — Addendum Note (Signed)
Addendum  created 06/23/16 0956 by Myrtie Soman, MD   Sign clinical note

## 2016-07-03 DIAGNOSIS — M1711 Unilateral primary osteoarthritis, right knee: Secondary | ICD-10-CM | POA: Diagnosis not present

## 2016-08-15 DIAGNOSIS — M1711 Unilateral primary osteoarthritis, right knee: Secondary | ICD-10-CM | POA: Diagnosis not present

## 2016-08-26 DIAGNOSIS — M1711 Unilateral primary osteoarthritis, right knee: Secondary | ICD-10-CM | POA: Diagnosis not present

## 2016-09-02 DIAGNOSIS — M1711 Unilateral primary osteoarthritis, right knee: Secondary | ICD-10-CM | POA: Diagnosis not present

## 2016-09-09 DIAGNOSIS — M1711 Unilateral primary osteoarthritis, right knee: Secondary | ICD-10-CM | POA: Diagnosis not present

## 2016-09-11 DIAGNOSIS — R5381 Other malaise: Secondary | ICD-10-CM | POA: Diagnosis not present

## 2016-09-11 DIAGNOSIS — F419 Anxiety disorder, unspecified: Secondary | ICD-10-CM | POA: Diagnosis not present

## 2016-09-11 DIAGNOSIS — G214 Vascular parkinsonism: Secondary | ICD-10-CM | POA: Diagnosis not present

## 2016-09-23 ENCOUNTER — Other Ambulatory Visit: Payer: Self-pay | Admitting: Orthopedic Surgery

## 2016-09-23 DIAGNOSIS — M25561 Pain in right knee: Secondary | ICD-10-CM

## 2016-10-01 ENCOUNTER — Ambulatory Visit
Admission: RE | Admit: 2016-10-01 | Discharge: 2016-10-01 | Disposition: A | Discharge status: Home / Self Care | Payer: Medicare Other | Source: Ambulatory Visit | Attending: Orthopedic Surgery | Admitting: Orthopedic Surgery

## 2016-10-01 DIAGNOSIS — M25561 Pain in right knee: Secondary | ICD-10-CM | POA: Diagnosis not present

## 2016-10-23 ENCOUNTER — Other Ambulatory Visit: Payer: Self-pay | Admitting: Cardiology

## 2016-10-23 DIAGNOSIS — M1711 Unilateral primary osteoarthritis, right knee: Secondary | ICD-10-CM | POA: Diagnosis not present

## 2016-10-23 DIAGNOSIS — M25561 Pain in right knee: Secondary | ICD-10-CM | POA: Diagnosis not present

## 2016-10-24 NOTE — Telephone Encounter (Signed)
Pt last saw Dr Radford Pax 11/05/15, last labs 01/28/16 Creat 0.60, age 66, weight 127kg, CrCl 220.49, based on CrCl pt is on appropriate dosage of Xarelto 20mg  QD.  Will refill rx.  Pt's pharmacy aware pt needs appt for future refills.

## 2016-11-05 DIAGNOSIS — I119 Hypertensive heart disease without heart failure: Secondary | ICD-10-CM | POA: Diagnosis not present

## 2016-11-05 DIAGNOSIS — E782 Mixed hyperlipidemia: Secondary | ICD-10-CM | POA: Diagnosis not present

## 2016-11-05 DIAGNOSIS — G20 Parkinson's disease: Secondary | ICD-10-CM | POA: Diagnosis not present

## 2016-11-05 DIAGNOSIS — Z23 Encounter for immunization: Secondary | ICD-10-CM | POA: Diagnosis not present

## 2016-11-05 DIAGNOSIS — G4733 Obstructive sleep apnea (adult) (pediatric): Secondary | ICD-10-CM | POA: Diagnosis not present

## 2016-11-05 DIAGNOSIS — Z0000 Encounter for general adult medical examination without abnormal findings: Secondary | ICD-10-CM | POA: Diagnosis not present

## 2016-11-05 DIAGNOSIS — I482 Chronic atrial fibrillation: Secondary | ICD-10-CM | POA: Diagnosis not present

## 2016-11-05 DIAGNOSIS — I25119 Atherosclerotic heart disease of native coronary artery with unspecified angina pectoris: Secondary | ICD-10-CM | POA: Diagnosis not present

## 2016-11-05 DIAGNOSIS — Z1159 Encounter for screening for other viral diseases: Secondary | ICD-10-CM | POA: Diagnosis not present

## 2016-11-05 DIAGNOSIS — R7301 Impaired fasting glucose: Secondary | ICD-10-CM | POA: Diagnosis not present

## 2016-11-05 DIAGNOSIS — N400 Enlarged prostate without lower urinary tract symptoms: Secondary | ICD-10-CM | POA: Diagnosis not present

## 2016-11-20 ENCOUNTER — Other Ambulatory Visit: Payer: Self-pay | Admitting: Cardiology

## 2016-11-20 DIAGNOSIS — F419 Anxiety disorder, unspecified: Secondary | ICD-10-CM | POA: Diagnosis not present

## 2016-11-20 DIAGNOSIS — G214 Vascular parkinsonism: Secondary | ICD-10-CM | POA: Diagnosis not present

## 2016-11-20 DIAGNOSIS — R5381 Other malaise: Secondary | ICD-10-CM | POA: Diagnosis not present

## 2016-12-02 ENCOUNTER — Encounter: Payer: Self-pay | Admitting: Cardiology

## 2016-12-03 ENCOUNTER — Ambulatory Visit (INDEPENDENT_AMBULATORY_CARE_PROVIDER_SITE_OTHER): Payer: Medicare Other | Admitting: Cardiology

## 2016-12-03 ENCOUNTER — Encounter: Payer: Self-pay | Admitting: Cardiology

## 2016-12-03 VITALS — BP 124/88 | HR 95 | Ht 70.0 in | Wt 284.8 lb

## 2016-12-03 DIAGNOSIS — G4733 Obstructive sleep apnea (adult) (pediatric): Secondary | ICD-10-CM

## 2016-12-03 DIAGNOSIS — R600 Localized edema: Secondary | ICD-10-CM | POA: Diagnosis not present

## 2016-12-03 DIAGNOSIS — I2510 Atherosclerotic heart disease of native coronary artery without angina pectoris: Secondary | ICD-10-CM | POA: Diagnosis not present

## 2016-12-03 DIAGNOSIS — I4819 Other persistent atrial fibrillation: Secondary | ICD-10-CM

## 2016-12-03 DIAGNOSIS — Z6841 Body Mass Index (BMI) 40.0 and over, adult: Secondary | ICD-10-CM

## 2016-12-03 DIAGNOSIS — I10 Essential (primary) hypertension: Secondary | ICD-10-CM | POA: Diagnosis not present

## 2016-12-03 DIAGNOSIS — I481 Persistent atrial fibrillation: Secondary | ICD-10-CM | POA: Diagnosis not present

## 2016-12-03 DIAGNOSIS — E7800 Pure hypercholesterolemia, unspecified: Secondary | ICD-10-CM | POA: Diagnosis not present

## 2016-12-03 MED ORDER — FUROSEMIDE 20 MG PO TABS: 20.0000 mg | ORAL_TABLET | Freq: Every day | ORAL | 3 refills | Status: DC
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
## 2016-12-03 NOTE — Patient Instructions (Signed)
Medication Instructions:  Your physician has recommended you make the following change in your medication:   STOP: hydrochlorothiazide  START: Lasix 20 mg a day    Labwork: None ordered   Testing/Procedures: None ordered   Follow-Up: Your physician wants you to follow-up in: 6 months with Dr. Radford Pax. You will receive a reminder letter in the mail two months in advance. If you don't receive a letter, please call our office to schedule the follow-up appointment.  Any Other Special Instructions Will Be Listed Below (If Applicable).  You will receive a call to schedule an appointment with the nutritionist.    If you need a refill on your cardiac medications before your next appointment, please call your pharmacy.

## 2016-12-10 ENCOUNTER — Other Ambulatory Visit: Payer: Self-pay | Admitting: Cardiology

## 2016-12-31 DIAGNOSIS — R3915 Urgency of urination: Secondary | ICD-10-CM | POA: Diagnosis not present

## 2016-12-31 DIAGNOSIS — N401 Enlarged prostate with lower urinary tract symptoms: Secondary | ICD-10-CM | POA: Diagnosis not present

## 2016-12-31 DIAGNOSIS — N5201 Erectile dysfunction due to arterial insufficiency: Secondary | ICD-10-CM | POA: Diagnosis not present

## 2017-01-07 ENCOUNTER — Telehealth: Payer: Self-pay | Admitting: Cardiology

## 2017-01-07 MED ORDER — FUROSEMIDE 40 MG PO TABS: 40.0000 mg | ORAL_TABLET | Freq: Every day | ORAL | 3 refills | Status: DC

## 2017-01-07 NOTE — Telephone Encounter (Signed)
Patient states on 12/03/16 started on lasix 20 mg once a day for BLE swelling and swelling has not improved much, informed patient I would send to Dr. Radford Pax for further recommendations. Patient verbalized understanding and thanked me for the call.

## 2017-01-07 NOTE — Telephone Encounter (Signed)
Instructed patient to increase lasix to 40 mg once a day. Patient verbalized understanding and thanked me for the call.

## 2017-01-07 NOTE — Telephone Encounter (Signed)
Pt c/o medication issue:  1. Name of Medication: furosemide  2. How are you currently taking this medication (dosage and times per day)? 20 mg 1x daily   3. Are you having a reaction (difficulty breathing--STAT)? no  4. What is your medication issue? Patient was instructed to call back if medication was not effective and Dr. Radford Pax would adjust dosage.

## 2017-01-07 NOTE — Telephone Encounter (Signed)
Increase lasix to 40mg  daily and make sure he is following a < 2gm sodium diet

## 2017-01-08 DIAGNOSIS — M25561 Pain in right knee: Secondary | ICD-10-CM | POA: Diagnosis not present

## 2017-01-08 DIAGNOSIS — M1711 Unilateral primary osteoarthritis, right knee: Secondary | ICD-10-CM | POA: Diagnosis not present

## 2017-02-03 DIAGNOSIS — M25561 Pain in right knee: Secondary | ICD-10-CM | POA: Diagnosis not present

## 2017-02-03 DIAGNOSIS — M1711 Unilateral primary osteoarthritis, right knee: Secondary | ICD-10-CM | POA: Diagnosis not present

## 2017-02-03 DIAGNOSIS — M25569 Pain in unspecified knee: Secondary | ICD-10-CM | POA: Diagnosis not present

## 2017-02-17 DIAGNOSIS — M545 Low back pain: Secondary | ICD-10-CM | POA: Diagnosis not present

## 2017-02-17 DIAGNOSIS — M25561 Pain in right knee: Secondary | ICD-10-CM | POA: Diagnosis not present

## 2017-02-17 DIAGNOSIS — M1711 Unilateral primary osteoarthritis, right knee: Secondary | ICD-10-CM | POA: Diagnosis not present

## 2017-03-12 DIAGNOSIS — G214 Vascular parkinsonism: Secondary | ICD-10-CM | POA: Diagnosis not present

## 2017-03-12 DIAGNOSIS — F419 Anxiety disorder, unspecified: Secondary | ICD-10-CM | POA: Diagnosis not present

## 2017-03-12 DIAGNOSIS — R5381 Other malaise: Secondary | ICD-10-CM | POA: Diagnosis not present

## 2017-05-13 ENCOUNTER — Other Ambulatory Visit: Payer: Self-pay | Admitting: Cardiology

## 2017-05-13 NOTE — Telephone Encounter (Signed)
Age 67 years WT 129.2kg 12/03/2016 Saw Dr Radford Pax 12/03/2016  11/05/2016 SrCr 0.78 CrCl 170.24 Refill done for Xarelto 20 mg daily as requested

## 2017-07-17 DIAGNOSIS — M545 Low back pain: Secondary | ICD-10-CM | POA: Diagnosis not present

## 2017-07-25 DIAGNOSIS — H00015 Hordeolum externum left lower eyelid: Secondary | ICD-10-CM | POA: Diagnosis not present

## 2017-08-24 DIAGNOSIS — R3915 Urgency of urination: Secondary | ICD-10-CM | POA: Diagnosis not present

## 2017-08-24 DIAGNOSIS — N401 Enlarged prostate with lower urinary tract symptoms: Secondary | ICD-10-CM | POA: Diagnosis not present

## 2017-08-24 DIAGNOSIS — N5201 Erectile dysfunction due to arterial insufficiency: Secondary | ICD-10-CM | POA: Diagnosis not present

## 2017-08-27 DIAGNOSIS — M13851 Other specified arthritis, right hip: Secondary | ICD-10-CM | POA: Diagnosis not present

## 2017-08-28 DIAGNOSIS — M1711 Unilateral primary osteoarthritis, right knee: Secondary | ICD-10-CM | POA: Diagnosis not present

## 2017-08-28 DIAGNOSIS — M545 Low back pain: Secondary | ICD-10-CM | POA: Diagnosis not present

## 2017-09-09 DIAGNOSIS — F419 Anxiety disorder, unspecified: Secondary | ICD-10-CM | POA: Diagnosis not present

## 2017-09-09 DIAGNOSIS — R5381 Other malaise: Secondary | ICD-10-CM | POA: Diagnosis not present

## 2017-09-09 DIAGNOSIS — G214 Vascular parkinsonism: Secondary | ICD-10-CM | POA: Diagnosis not present

## 2017-10-19 DIAGNOSIS — M5137 Other intervertebral disc degeneration, lumbosacral region: Secondary | ICD-10-CM | POA: Diagnosis not present

## 2017-10-19 DIAGNOSIS — E538 Deficiency of other specified B group vitamins: Secondary | ICD-10-CM | POA: Diagnosis not present

## 2017-10-19 DIAGNOSIS — E291 Testicular hypofunction: Secondary | ICD-10-CM | POA: Diagnosis not present

## 2017-10-19 DIAGNOSIS — R5381 Other malaise: Secondary | ICD-10-CM | POA: Diagnosis not present

## 2017-10-28 DIAGNOSIS — E291 Testicular hypofunction: Secondary | ICD-10-CM | POA: Diagnosis not present

## 2017-10-28 DIAGNOSIS — R5381 Other malaise: Secondary | ICD-10-CM | POA: Diagnosis not present

## 2017-11-09 ENCOUNTER — Other Ambulatory Visit: Payer: Self-pay | Admitting: Cardiology

## 2017-11-19 DIAGNOSIS — M5136 Other intervertebral disc degeneration, lumbar region: Secondary | ICD-10-CM | POA: Diagnosis not present

## 2017-11-19 DIAGNOSIS — G894 Chronic pain syndrome: Secondary | ICD-10-CM | POA: Diagnosis not present

## 2017-11-19 DIAGNOSIS — M545 Low back pain: Secondary | ICD-10-CM | POA: Diagnosis not present

## 2017-11-20 DIAGNOSIS — G20 Parkinson's disease: Secondary | ICD-10-CM | POA: Diagnosis not present

## 2017-11-20 DIAGNOSIS — Z23 Encounter for immunization: Secondary | ICD-10-CM | POA: Diagnosis not present

## 2017-11-20 DIAGNOSIS — E291 Testicular hypofunction: Secondary | ICD-10-CM | POA: Diagnosis not present

## 2017-11-20 DIAGNOSIS — I119 Hypertensive heart disease without heart failure: Secondary | ICD-10-CM | POA: Diagnosis not present

## 2017-11-20 DIAGNOSIS — R7301 Impaired fasting glucose: Secondary | ICD-10-CM | POA: Diagnosis not present

## 2017-11-20 DIAGNOSIS — R600 Localized edema: Secondary | ICD-10-CM | POA: Diagnosis not present

## 2017-11-20 DIAGNOSIS — I482 Chronic atrial fibrillation, unspecified: Secondary | ICD-10-CM | POA: Diagnosis not present

## 2017-11-20 DIAGNOSIS — G4733 Obstructive sleep apnea (adult) (pediatric): Secondary | ICD-10-CM | POA: Diagnosis not present

## 2017-11-20 DIAGNOSIS — E782 Mixed hyperlipidemia: Secondary | ICD-10-CM | POA: Diagnosis not present

## 2017-11-20 DIAGNOSIS — Z0000 Encounter for general adult medical examination without abnormal findings: Secondary | ICD-10-CM | POA: Diagnosis not present

## 2017-11-20 DIAGNOSIS — I25119 Atherosclerotic heart disease of native coronary artery with unspecified angina pectoris: Secondary | ICD-10-CM | POA: Diagnosis not present

## 2017-11-27 DIAGNOSIS — E291 Testicular hypofunction: Secondary | ICD-10-CM | POA: Diagnosis not present

## 2017-12-09 DIAGNOSIS — Z1211 Encounter for screening for malignant neoplasm of colon: Secondary | ICD-10-CM | POA: Diagnosis not present

## 2017-12-11 ENCOUNTER — Other Ambulatory Visit: Payer: Self-pay | Admitting: Cardiology

## 2017-12-25 DIAGNOSIS — E291 Testicular hypofunction: Secondary | ICD-10-CM | POA: Diagnosis not present

## 2018-01-22 ENCOUNTER — Other Ambulatory Visit: Payer: Self-pay | Admitting: Cardiology

## 2018-01-22 DIAGNOSIS — E291 Testicular hypofunction: Secondary | ICD-10-CM | POA: Diagnosis not present

## 2018-02-17 NOTE — Progress Notes (Signed)
Cardiology Office Note    Date:  02/23/2018   ID:  Isaiah Pena, DOB 21-Oct-1950, MRN [id number]  PCP:  Mayra Neer, MD  Cardiologist: No primary care provider on file. EPS: None  No chief complaint on file.   History of Present Illness:  Isaiah Pena is a 68 y.o. male CAD status post PCI of the RCA 2004, hypertension, HLD, permanent atrial fibrillation on Xarelto and OSA on CPAP.  I seen by Dr. Radford Pax 11/2016 at which time she stopped his HCTZ and started low-dose Lasix for lower extremity edema.  Patient comes in for f/u.Asymptomatic with atrial fibrillation. No longer on lasix for edema. Hasn't had a problem since watching salt. Walks with a cane because of parkinson's disease.  Is very unsteady.  Has trouble walking.  Has some dyspnea on exertion but says it is related to his Parkinson's.  Denies chest pain, palpitations, dizziness or presyncope.  Trying to lose weight and going to join a swimming club. Lost 6-7 lbs since November. Hasn't used CPAP since Sept since his machine started smoking. Has had trouble with advanced home care.  Past Medical History:  Diagnosis Date  . Anticoagulant long-term use    xarelto  . Bilateral lower extremity edema   . BPH (benign prostatic hyperplasia)   . BPH (benign prostatic hyperplasia)   . Central serous retinopathy    LOSS OF CENTER VISION LEFT EYE W/ BLURRED VISION-  was treated  methotrexate by specialist at [facility] and released  . Coronary artery disease cardiologist-  dr Tressia Miners turner   01/ 2004  abnormal cardiolite  s/p  cardiac cath w/ PCI and DES to dRCA  . Erectile dysfunction   . GERD (gastroesophageal reflux disease)   . History of colon polyps    2011 per pt benign  . History of kidney stones   . History of simple renal cyst    drained via radiology  . Hyperlipidemia   . Hypertension   . Lower urinary tract symptoms (LUTS)   . OSA treated with BiPAP    moderate per study 01-23-2002  . Parkinson's disease [facility])    neurologist-  dr ed hill ([facility])  . Permanent atrial fibrillation   . S/P drug eluting coronary stent placement    01/ 2004  x1 to dRCA    Past Surgical History:  Procedure Laterality Date  . CARDIOVASCULAR STRESS TEST  05-07-2015   dr Tressia Miners turner   normal nuclear study w/ no ischemia/  normal LV function and wall motion , stress ef 55%  . COLONOSCOPY WITH PROPOFOL  2011  . CORONARY ANGIOPLASTY WITH STENT PLACEMENT  02-11-2002  dr Daneen Schick   abnormal cardiolite--  PCI and DES to dRCA (99%),  pRCA luminal irregularities, mild to moderate LAD and CFX disease,  normal LVF  . CYSTOSCOPY WITH INSERTION OF UROLIFT N/A 01/28/2016   Procedure: CYSTOSCOPY WITH INSERTION OF UROLIFT;  Surgeon: Franchot Gallo, MD;  Location: [facility];  Service: Urology;  Laterality: N/A;  . HERNIA REPAIR     AGE 45  . KNEE ARTHROSCOPY  03/21/2011   Procedure: ARTHROSCOPY KNEE;  Surgeon: Tobi Bastos, MD;  Location: [facility];  Service: Orthopedics;  Laterality: Right;    Current Medications: Current Meds  Medication Sig  . Amantadine HCl ER 68.5 MG CP24 Take 2 capsules at bedtime by mouth.  Marland Kitchen amLODipine-benazepril (LOTREL) 10-40 MG per capsule TAKE CAPSULE CAPSULE BY MOUTH ONCE DAILY (Patient taking differently: TAKE CAPSULE  CAPSULE BY MOUTH ONCE DAILY-- takes in am)  . atorvastatin (LIPITOR) 10 MG tablet ALTERNATE TAKING 1/2 TAB BY MOUTH ONE DAY AND 1 TAB BY MOUTH THE NEXT DAY Please call and schedule an appt for further refills1st attempt  . AZILECT 1 MG TABS tablet Take 1 tablet by mouth every morning.   . carbidopa-levodopa (SINEMET IR) 25-100 MG tablet Take 2 tablets 3 (three) times daily by mouth.  . entacapone (COMTAN) 200 MG tablet TK 1 T PO TID WITH CARBIDOPA/LEVODOPA  . ezetimibe (ZETIA) 10 MG tablet Take 10 mg by mouth every morning.  . hydrochlorothiazide (HYDRODIURIL) 25 MG tablet Take 1 tablet by mouth daily.  Marland Kitchen L-Methylfolate-Algae-B12-B6 (METANX) 3-90.314-2-35 MG  CAPS Take 1 tablet by mouth 2 (two) times daily.  . metoprolol (LOPRESSOR) 50 MG tablet Take 50 mg by mouth 2 (two) times daily.  . Omeprazole (PRILOSEC PO) Take 1 tablet by mouth daily.  Marland Kitchen oxybutynin (DITROPAN) 5 MG tablet Take 5 mg by mouth 3 (three) times daily.   . Tadalafil (CIALIS PO) Take 1 tablet by mouth as needed.  . traMADol (ULTRAM) 50 MG tablet Take 1 tablet by mouth as needed.  Alveda Reasons 20 MG TABS tablet TAKE 1 TABLET BY MOUTH EVERY DAY WITH SUPPER     Allergies:   Sulfa antibiotics   Social History   Socioeconomic History  . Marital status: Married    Spouse name: Not on file  . Number of children: Not on file  . Years of education: Not on file  . Highest education level: Not on file  Occupational History  . Not on file  Social Needs  . Financial resource strain: Not on file  . Food insecurity:    Worry: Not on file    Inability: Not on file  . Transportation needs:    Medical: Not on file    Non-medical: Not on file  Tobacco Use  . Smoking status: Never Smoker  . Smokeless tobacco: Never Used  Substance and Sexual Activity  . Alcohol use: No  . Drug use: No  . Sexual activity: Not on file  Lifestyle  . Physical activity:    Days per week: Not on file    Minutes per session: Not on file  . Stress: Not on file  Relationships  . Social connections:    Talks on phone: Not on file    Gets together: Not on file    Attends religious service: Not on file    Active member of club or organization: Not on file    Attends meetings of clubs or organizations: Not on file    Relationship status: Not on file  Other Topics Concern  . Not on file  Social History Narrative  . Not on file     Family History:  The patient's family history is not on file.   ROS:   Please see the history of present illness.    Review of Systems  Constitution: Positive for malaise/fatigue.  Cardiovascular: Positive for dyspnea on exertion and leg swelling.  Musculoskeletal:  Positive for back pain, joint swelling and myalgias.  Gastrointestinal: Positive for constipation.  Neurological: Positive for loss of balance.  Psychiatric/Behavioral: Positive for depression.   All other systems reviewed and are negative.   PHYSICAL EXAM:   VS:  BP 130/76   Pulse 88   Ht 5\' 10"  (1.778 m)   Wt 272 lb 12.8 oz (123.7 kg)   SpO2 98%   BMI 39.14 kg/m  Physical Exam  GEN: Obese, in no acute distress  Neck: no JVD, carotid bruits, or masses Cardiac: Irregular irregular; no murmurs, rubs, or gallops  Respiratory:  clear to auscultation bilaterally, normal work of breathing GI: soft, nontender, nondistended, + BS Ext: +1-2 brawny edema right greater than left without cyanosis, clubbing, Good distal pulses bilaterally Neuro:  Alert and Oriented x 3 Psych: euthymic mood, full affect  Wt Readings from Last 3 Encounters:  02/23/18 272 lb 12.8 oz (123.7 kg)  12/03/16 284 lb 12.8 oz (129.2 kg)  01/28/16 280 lb (127 kg)      Studies/Labs Reviewed:   EKG:  EKG is  ordered today.  The ekg ordered today demonstrates atrial fibrillation at 88/m  Recent Labs: No results found for requested labs within last 8760 hours.   Lipid Panel    Component Value Date/Time   CHOL 106 (L) 11/09/2015 0858   TRIG 81 11/09/2015 0858   HDL 45 11/09/2015 0858   CHOLHDL 2.4 11/09/2015 0858   VLDL 16 11/09/2015 0858   LDLCALC 45 11/09/2015 0858    Additional studies/ records that were reviewed today include:    NST 4/17/2017Nuclear stress EF: 55%.  There was no ST segment deviation noted during stress.  The study is normal.  This is a low risk study.  The left ventricular ejection fraction is normal (55-65%).   Normal resting and stress perfusion no ischemia or infarction EF 55%  Cardiac catheterization 2004CONCLUSION:  1. Severe coronary artery disease with subtotally occluded distal right     coronary, producing the abnormal Cardiolite and the predominance of the      patient's exertional symptoms.  2. The patient has mild to moderate LAD and circumflex disease as described     above.  3. Normal LV function.  4. Successful PCI with skin implantation of the distal right coronary and     reduction in stenosis from 99% to 0% with TIMI grade free flow.    PLAN:  1. Plavix and aspirin times six months.  2.     Discharge February 12, 2002.  3. Follow-up appointment with Dr. Fransico Him in 7-10 days.                                                      Belva Crome III, M.D.     ASSESSMENT:    1. Coronary artery disease involving native coronary artery of native heart without angina pectoris   2. Persistent atrial fibrillation ([place])   3. Essential hypertension   4. OSA (obstructive sleep apnea)   5. Mixed hyperlipidemia   6. Edema of extremities      PLAN:  In order of problems listed above:  CAD status post PCI of the RCA 2004- no angina follow-up with Dr. Radford Pax in 1 year.  Paroxysmal atrial fibrillation on Xarelto and metoprolol-rate controlled renal function normal in November.  Essential hypertension BP well controlled  OSA on CPAP-not working since Sept. Having Gae Bon work with patient on getting it working  Hyperlipidemia on atorvastatin and Zetia LDL 60 in November  Lower extremity edema felt combination of sedentary state and amlodipine and started on Lasix last year. Not on lasix anymore. Recommend compression stockings. Patient has had trouble getting them on in the past.  Medication Adjustments/Labs and Tests Ordered: Current medicines are  reviewed at length with the patient today.  Concerns regarding medicines are outlined above.  Medication changes, Labs and Tests ordered today are listed in the Patient Instructions below. Patient Instructions  Medication Instructions:  Your physician recommends that you continue on your current medications as directed. Please refer to the Current Medication list given to you today.  If  you need a refill on your cardiac medications before your next appointment, please call your pharmacy.   Lab work: None  If you have labs (blood work) drawn today and your tests are completely normal, you will receive your results only by: Marland Kitchen MyChart Message (if you have MyChart) OR . A paper copy in the mail If you have any lab test that is abnormal or we need to change your treatment, we will call you to review the results.  Testing/Procedures: None  Follow-Up: At [facility], you and your health needs are our priority.  As part of our continuing mission to provide you with exceptional heart care, we have created designated Provider Care Teams.  These Care Teams include your primary Cardiologist (physician) and Advanced Practice Providers (APPs -  Physician Assistants and Nurse Practitioners) who all work together to provide you with the care you need, when you need it. You will need a follow up appointment in 1 years.  Please call our office 2 months in advance to schedule this appointment.  You may see Dr. Radford Pax or one of the following Advanced Practice Providers on your designated Care Team:   [place], PA-C Melina Copa, PA-C . Ermalinda Barrios, PA-C  Any Other Special Instructions Will Be Listed Below (If Applicable).   How to Use Compression Stockings Compression stockings are elastic socks that squeeze the legs. They help increase blood flow (circulation) to the legs, decrease swelling in the legs, and reduce the chance of developing blood clots in the lower legs. Compression stockings are often used by people who:  Are recovering from surgery.  Have poor circulation in their legs.  Tend to get blood clots in their legs.  Have bulging (varicose) veins.  Sit or stay in bed for long periods of time. Follow instructions from your health care provider about how and when to wear your compression stockings. How to wear compression stockings Before you put on your  compression stockings:  Make sure that they are the correct size and degree of compression. If you do not know your size or required grade of compression, ask your health care provider and follow the manufacturer's instructions that come with the stockings.  Make sure that they are clean, dry, and in good condition.  Check them for rips and tears. Do not put them on if they are ripped or torn. Put your stockings on first thing in the morning, before you get out of bed. Keep them on for as long as your health care provider advises. When you are wearing your stockings:  Keep them as smooth as possible. Do not allow them to bunch up. It is especially important to prevent the stockings from bunching up around your toes or behind your knees.  Do not roll the stockings downward and leave them rolled down. This can decrease blood flow to your leg.  Change them right away if they become wet or dirty. When you take off your stockings, inspect your legs and feet. Check for:  Open sores.  Red spots.  Swelling. General tips  Do not stop wearing compression stockings without talking to your health care  provider first.  Wash your stockings every day with mild detergent in cold or warm water. Do not use bleach. Air-dry your stockings or dry them in a clothes dryer on low heat. It may be helpful to have two pairs so that you have a pair to wear while the other is being washed.  Replace your stockings every 3-6 months.  If skin moisturizing is part of your treatment plan, apply lotion or cream at night so that your skin will be dry when you put on the stockings in the morning. It is harder to put the stockings on when you have lotion on your legs or feet.  Wear nonskid shoes or slip-resistant socks when walking while wearing compression stockings. Contact a health care provider and remove your stockings if you have:  A feeling of pins and needles in your feet or legs.  Open sores, red spots, or  other skin changes on your feet or legs.  Swelling or pain that gets worse. Get help right away if you have:  Numbness or tingling in your lower legs that does not get better right after you take the stockings off.  Toes or feet that are unusually cold or turn a bluish color.  A warm or red area on your leg.  New swelling or soreness in your leg.  Shortness of breath.  Chest pain.  A fast or irregular heartbeat.  Light-headedness.  Dizziness. Summary  Compression stockings are elastic socks that squeeze the legs.  They help increase blood flow (circulation) to the legs, decrease swelling in the legs, and reduce the chance of developing blood clots in the lower legs.  Follow instructions from your health care provider about how and when to wear your compression stockings.  Do not stop wearing your compression stockings without talking to your health care provider first. This information is not intended to replace advice given to you by your health care provider. Make sure you discuss any questions you have with your health care provider. Document Released: 11/03/2008 Document Revised: 01/08/2017 Document Reviewed: 01/08/2017 Elsevier Interactive Patient Education  2019 [place], Ermalinda Barrios, [place]  02/23/2018 10:29 AM    [facility] [address], [place], [place]  [address] Phone: [phone]; Fax: [phone]

## 2018-02-19 DIAGNOSIS — M5137 Other intervertebral disc degeneration, lumbosacral region: Secondary | ICD-10-CM | POA: Diagnosis not present

## 2018-02-20 ENCOUNTER — Other Ambulatory Visit: Payer: Self-pay | Admitting: Cardiology

## 2018-02-22 DIAGNOSIS — E291 Testicular hypofunction: Secondary | ICD-10-CM | POA: Diagnosis not present

## 2018-02-22 DIAGNOSIS — E782 Mixed hyperlipidemia: Secondary | ICD-10-CM | POA: Diagnosis not present

## 2018-02-22 NOTE — Telephone Encounter (Signed)
Pt last saw Dr Radford Pax 12/03/16 has upcoming appt scheduled for tomorrow 02/23/18, age 68, weight 129.2kg, last labs Creat 0.77 on 11/20/17 at [facility].  Based on specified criteria pt is on appropriate dosage of Eliquis 5mg  BID.  Will refill rx

## 2018-02-23 ENCOUNTER — Encounter: Payer: Self-pay | Admitting: Physician Assistant

## 2018-02-23 ENCOUNTER — Ambulatory Visit (INDEPENDENT_AMBULATORY_CARE_PROVIDER_SITE_OTHER): Payer: Medicare Other | Admitting: Physician Assistant

## 2018-02-23 VITALS — BP 130/76 | HR 88 | Ht 70.0 in | Wt 272.8 lb

## 2018-02-23 DIAGNOSIS — G4733 Obstructive sleep apnea (adult) (pediatric): Secondary | ICD-10-CM

## 2018-02-23 DIAGNOSIS — I10 Essential (primary) hypertension: Secondary | ICD-10-CM | POA: Diagnosis not present

## 2018-02-23 DIAGNOSIS — E782 Mixed hyperlipidemia: Secondary | ICD-10-CM

## 2018-02-23 DIAGNOSIS — I4819 Other persistent atrial fibrillation: Secondary | ICD-10-CM | POA: Diagnosis not present

## 2018-02-23 DIAGNOSIS — I2510 Atherosclerotic heart disease of native coronary artery without angina pectoris: Secondary | ICD-10-CM | POA: Diagnosis not present

## 2018-02-23 DIAGNOSIS — R600 Localized edema: Secondary | ICD-10-CM | POA: Diagnosis not present

## 2018-02-23 NOTE — Patient Instructions (Signed)
Medication Instructions:  Your physician recommends that you continue on your current medications as directed. Please refer to the Current Medication list given to you today.  If you need a refill on your cardiac medications before your next appointment, please call your pharmacy.   Lab work: None  If you have labs (blood work) drawn today and your tests are completely normal, you will receive your results only by: Marland Kitchen MyChart Message (if you have MyChart) OR . A paper copy in the mail If you have any lab test that is abnormal or we need to change your treatment, we will call you to review the results.  Testing/Procedures: None  Follow-Up: At [facility], you and your health needs are our priority.  As part of our continuing mission to provide you with exceptional heart care, we have created designated Provider Care Teams.  These Care Teams include your primary Cardiologist (physician) and Advanced Practice Providers (APPs -  Physician Assistants and Nurse Practitioners) who all work together to provide you with the care you need, when you need it. You will need a follow up appointment in 1 years.  Please call our office 2 months in advance to schedule this appointment.  You may see Dr. Radford Pax or one of the following Advanced Practice Providers on your designated Care Team:   [place], PA-C Melina Copa, PA-C . Ermalinda Barrios, PA-C  Any Other Special Instructions Will Be Listed Below (If Applicable).   How to Use Compression Stockings Compression stockings are elastic socks that squeeze the legs. They help increase blood flow (circulation) to the legs, decrease swelling in the legs, and reduce the chance of developing blood clots in the lower legs. Compression stockings are often used by people who:  Are recovering from surgery.  Have poor circulation in their legs.  Tend to get blood clots in their legs.  Have bulging (varicose) veins.  Sit or stay in bed for long periods  of time. Follow instructions from your health care provider about how and when to wear your compression stockings. How to wear compression stockings Before you put on your compression stockings:  Make sure that they are the correct size and degree of compression. If you do not know your size or required grade of compression, ask your health care provider and follow the manufacturer's instructions that come with the stockings.  Make sure that they are clean, dry, and in good condition.  Check them for rips and tears. Do not put them on if they are ripped or torn. Put your stockings on first thing in the morning, before you get out of bed. Keep them on for as long as your health care provider advises. When you are wearing your stockings:  Keep them as smooth as possible. Do not allow them to bunch up. It is especially important to prevent the stockings from bunching up around your toes or behind your knees.  Do not roll the stockings downward and leave them rolled down. This can decrease blood flow to your leg.  Change them right away if they become wet or dirty. When you take off your stockings, inspect your legs and feet. Check for:  Open sores.  Red spots.  Swelling. General tips  Do not stop wearing compression stockings without talking to your health care provider first.  Wash your stockings every day with mild detergent in cold or warm water. Do not use bleach. Air-dry your stockings or dry them in a clothes dryer on low heat.  It may be helpful to have two pairs so that you have a pair to wear while the other is being washed.  Replace your stockings every 3-6 months.  If skin moisturizing is part of your treatment plan, apply lotion or cream at night so that your skin will be dry when you put on the stockings in the morning. It is harder to put the stockings on when you have lotion on your legs or feet.  Wear nonskid shoes or slip-resistant socks when walking while wearing  compression stockings. Contact a health care provider and remove your stockings if you have:  A feeling of pins and needles in your feet or legs.  Open sores, red spots, or other skin changes on your feet or legs.  Swelling or pain that gets worse. Get help right away if you have:  Numbness or tingling in your lower legs that does not get better right after you take the stockings off.  Toes or feet that are unusually cold or turn a bluish color.  A warm or red area on your leg.  New swelling or soreness in your leg.  Shortness of breath.  Chest pain.  A fast or irregular heartbeat.  Light-headedness.  Dizziness. Summary  Compression stockings are elastic socks that squeeze the legs.  They help increase blood flow (circulation) to the legs, decrease swelling in the legs, and reduce the chance of developing blood clots in the lower legs.  Follow instructions from your health care provider about how and when to wear your compression stockings.  Do not stop wearing your compression stockings without talking to your health care provider first. This information is not intended to replace advice given to you by your health care provider. Make sure you discuss any questions you have with your health care provider. Document Released: 11/03/2008 Document Revised: 01/08/2017 Document Reviewed: 01/08/2017 Elsevier Interactive Patient Education  2019 [employer].

## 2018-02-26 DIAGNOSIS — E291 Testicular hypofunction: Secondary | ICD-10-CM | POA: Diagnosis not present

## 2018-03-01 ENCOUNTER — Telehealth: Payer: Self-pay | Admitting: *Deleted

## 2018-03-01 DIAGNOSIS — G4733 Obstructive sleep apnea (adult) (pediatric): Secondary | ICD-10-CM

## 2018-03-01 NOTE — Telephone Encounter (Signed)
-----   Message from Sueanne Margarita, MD sent at 02/23/2018  9:48 PM EST ----- Regarding: RE: new Cpap If Sharyn Lull states in her note that he would like a new PAP device and why then ok to order Resmed CPAP from 5 to 18cm H2O with heated humidity and mask of choice  he will need 10 week followup  Traci turner ----- Message ----- From: Freada Bergeron, CMA Sent: 02/23/2018   6:49 PM EST To: Sueanne Margarita, MD Subject: new Cpap                                       Patient saw Ermalinda Barrios today and is asking for a new CPAP because [place] and he feels it is unsafe to operate it. His last appointment with you was 12/03/2016 and he was suppose to follow up in 6 months. He has a Quattro II large FF mask. Please advise, Gae Bon

## 2018-03-01 NOTE — Addendum Note (Signed)
Addended by: Freada Bergeron on: 03/01/2018 12:51 PM   Modules accepted: Orders

## 2018-03-03 NOTE — Telephone Encounter (Signed)
Per Dr Radford Pax order placed to [facility] for: please order him a ResMed CPAP with supplies on auto from 4 to 18 cm H2O as a 2-week auto titration and then get a download in 2 weeks. He will need to follow-up with me in 10 weeks.

## 2018-03-03 NOTE — Addendum Note (Signed)
Addended by: Freada Bergeron on: 03/03/2018 05:20 PM   Modules accepted: Orders

## 2018-03-11 DIAGNOSIS — G214 Vascular parkinsonism: Secondary | ICD-10-CM | POA: Diagnosis not present

## 2018-03-11 DIAGNOSIS — R5381 Other malaise: Secondary | ICD-10-CM | POA: Diagnosis not present

## 2018-03-11 DIAGNOSIS — F419 Anxiety disorder, unspecified: Secondary | ICD-10-CM | POA: Diagnosis not present

## 2018-03-23 ENCOUNTER — Other Ambulatory Visit: Payer: Self-pay | Admitting: Cardiology

## 2018-03-26 DIAGNOSIS — E291 Testicular hypofunction: Secondary | ICD-10-CM | POA: Diagnosis not present

## 2018-04-30 NOTE — Telephone Encounter (Signed)
Hello Dr Radford Pax, Per [facility] this order is for a cpap ( please order him a ResMed CPAP with supplies on auto from 4 to 18 cm H2O as a 2-week auto titration and then get a download in 2 weeks. He will need to follow-up with me in 10 weeks.  The patient has a bipap machine with the setting IPAP-14 and EPAP -10 please write order for bipap.  Thanks

## 2018-05-03 ENCOUNTER — Telehealth: Payer: Self-pay | Admitting: *Deleted

## 2018-05-03 DIAGNOSIS — G4733 Obstructive sleep apnea (adult) (pediatric): Secondary | ICD-10-CM

## 2018-05-03 NOTE — Telephone Encounter (Signed)
-----   Message from Sueanne Margarita, MD sent at 04/30/2018  4:49 PM EDT ----- Regarding: RE: New order Correction - please order Resmed BiPAP at 14/10cm H2O with heated humidity and mask of choice  Traci TUrner ----- Message ----- From: Freada Bergeron, CMA Sent: 04/30/2018  11:10 AM EDT To: Sueanne Margarita, MD Subject: New order                                      Hello Dr Radford Pax, Per [facility] this order is for a cpap ( please order him a ResMed CPAP with supplies on auto from 4 to 18 cm H2O as a 2-week auto titration and then get a download in 2 weeks. He will need to follow-up with me in 10 weeks. The patient has a bipap machine with the setting IPAP-14  and EPAP -10  please write order for bipap. Thanks

## 2018-05-03 NOTE — Telephone Encounter (Signed)
New order faxed to [facility]

## 2018-05-11 DIAGNOSIS — R3915 Urgency of urination: Secondary | ICD-10-CM | POA: Diagnosis not present

## 2018-05-11 DIAGNOSIS — N401 Enlarged prostate with lower urinary tract symptoms: Secondary | ICD-10-CM | POA: Diagnosis not present

## 2018-06-01 DIAGNOSIS — I482 Chronic atrial fibrillation, unspecified: Secondary | ICD-10-CM | POA: Diagnosis not present

## 2018-06-01 DIAGNOSIS — R7301 Impaired fasting glucose: Secondary | ICD-10-CM | POA: Diagnosis not present

## 2018-06-01 DIAGNOSIS — I119 Hypertensive heart disease without heart failure: Secondary | ICD-10-CM | POA: Diagnosis not present

## 2018-06-01 DIAGNOSIS — G20 Parkinson's disease: Secondary | ICD-10-CM | POA: Diagnosis not present

## 2018-06-01 DIAGNOSIS — E291 Testicular hypofunction: Secondary | ICD-10-CM | POA: Diagnosis not present

## 2018-06-01 DIAGNOSIS — I25119 Atherosclerotic heart disease of native coronary artery with unspecified angina pectoris: Secondary | ICD-10-CM | POA: Diagnosis not present

## 2018-06-01 DIAGNOSIS — E782 Mixed hyperlipidemia: Secondary | ICD-10-CM | POA: Diagnosis not present

## 2018-06-01 DIAGNOSIS — Z6841 Body Mass Index (BMI) 40.0 and over, adult: Secondary | ICD-10-CM | POA: Diagnosis not present

## 2018-06-17 DIAGNOSIS — M1711 Unilateral primary osteoarthritis, right knee: Secondary | ICD-10-CM | POA: Diagnosis not present

## 2018-06-17 DIAGNOSIS — M25561 Pain in right knee: Secondary | ICD-10-CM | POA: Diagnosis not present

## 2018-06-18 DIAGNOSIS — M545 Low back pain: Secondary | ICD-10-CM | POA: Diagnosis not present

## 2018-06-18 DIAGNOSIS — G894 Chronic pain syndrome: Secondary | ICD-10-CM | POA: Diagnosis not present

## 2018-06-24 DIAGNOSIS — E291 Testicular hypofunction: Secondary | ICD-10-CM | POA: Diagnosis not present

## 2018-07-16 NOTE — Telephone Encounter (Signed)
Patient has a 10 week follow up appointment scheduled for 09/20/18. Patient understands he needs to keep this appointment for insurance compliance. Left detailed message on voicemail with date and time of appt. and informed patient to call back to confirm or reschedule.

## 2018-07-22 DIAGNOSIS — E291 Testicular hypofunction: Secondary | ICD-10-CM | POA: Diagnosis not present

## 2018-08-04 ENCOUNTER — Other Ambulatory Visit: Payer: Self-pay | Admitting: Cardiology

## 2018-08-04 NOTE — Telephone Encounter (Signed)
Xarelto 20mg  refill request received; pt is 68 yrs old, wt-123.7kg, Crea-0.77 via [facility] at [place] on 11/20/2017, last seen by Estella Husk on 02/23/2018, Diagnosis AFIB, CrCl-126.31ml/min; will send in refill to requested pharmacy.

## 2018-08-19 ENCOUNTER — Telehealth: Payer: Self-pay | Admitting: *Deleted

## 2018-08-19 DIAGNOSIS — E291 Testicular hypofunction: Secondary | ICD-10-CM | POA: Diagnosis not present

## 2018-08-19 NOTE — Telephone Encounter (Signed)
Informed patient of compliance results and verbalized understanding was indicated. Patient is aware and agreeable to AHI being within range at 0.5. Patient is aware and agreeable to no change in current pressures. Why his usage is down: Patient is having trouble with a skin rash from his mask so he gives his-self a day for it to clear up then put it back on.

## 2018-08-19 NOTE — Telephone Encounter (Signed)
-----   Message from Sueanne Margarita, MD sent at 08/18/2018 12:36 PM EDT ----- Good AHI and compliance.  Continue current PAP settings.Please find out why he is not using it

## 2018-09-16 ENCOUNTER — Telehealth: Payer: Self-pay

## 2018-09-16 DIAGNOSIS — E291 Testicular hypofunction: Secondary | ICD-10-CM | POA: Diagnosis not present

## 2018-09-16 NOTE — Telephone Encounter (Signed)
YOUR CARDIOLOGY TEAM HAS ARRANGED FOR AN E-VISIT FOR YOUR APPOINTMENT - PLEASE REVIEW IMPORTANT INFORMATION BELOW SEVERAL DAYS PRIOR TO YOUR APPOINTMENT ° °Due to the recent COVID-19 pandemic, we are transitioning in-person office visits to tele-medicine visits in an effort to decrease unnecessary exposure to our patients, their families, and staff. These visits are billed to your insurance just like a normal visit is. We also encourage you to sign up for MyChart if you have not already done so. You will need a smartphone if possible. For patients that do not have this, we can still complete the visit using a regular telephone but do prefer a smartphone to enable video when possible. You may have a family member that lives with you that can help. If possible, we also ask that you have a blood pressure cuff and scale at home to measure your blood pressure, heart rate and weight prior to your scheduled appointment. Patients with clinical needs that need an in-person evaluation and testing will still be able to come to the office if absolutely necessary. If you have any questions, feel free to call our office. ° ° ° ° °YOUR PROVIDER WILL BE USING THE FOLLOWING PLATFORM TO COMPLETE YOUR VISIT: Phone Call ° °• IF USING MYCHART - How to Download the MyChart App to Your SmartPhone  ° °- If Apple, go to App Store and type in MyChart in the search bar and download the app. If Android, ask patient to go to Google Play Store and type in MyChart in the search bar and download the app. The app is free but as with any other app downloads, your phone may require you to verify saved payment information or Apple/Android password.  °- You will need to then log into the app with your MyChart username and password, and select [facility] as your healthcare provider to link the account.  °- When it is time for your visit, go to the MyChart app, find appointments, and click Begin Video Visit. Be sure to Select Allow for your device to  access the Microphone and Camera for your visit. You will then be connected, and your provider will be with you shortly. ° **If you have any issues connecting or need assistance, please contact MyChart service desk [phone] ([phone])** ° **If using a computer, in order to ensure the best quality for your visit, you will need to use either of the following Internet Browsers: Google Chrome or Microsoft Edge** ° °• IF USING DOXIMITY or [email or web address] - The staff will give you instructions on receiving your link to join the meeting the day of your visit.  ° ° ° ° °2-3 DAYS BEFORE YOUR APPOINTMENT ° °You will receive a telephone call from one of our [facility] team members - your caller ID may say "Unknown caller." If this is a video visit, we will walk you through how to get the video launched on your phone. We will remind you check your blood pressure, heart rate and weight prior to your scheduled appointment. If you have an Apple Watch or Kardia, please upload any pertinent ECG strips the day before or morning of your appointment to MyChart. Our staff will also make sure you have reviewed the consent and agree to move forward with your scheduled tele-health visit.  ° ° ° °THE DAY OF YOUR APPOINTMENT ° °Approximately 15 minutes prior to your scheduled appointment, you will receive a telephone call from one of [facility] team - your caller ID may say "Unknown caller."    Our staff will confirm medications, vital signs for the day and any symptoms you may be experiencing. Please have this information available prior to the time of visit start. It may also be helpful for you to have a pad of paper and pen handy for any instructions given during your visit. They will also walk you through joining the smartphone meeting if this is a video visit.    CONSENT FOR TELE-HEALTH VISIT - PLEASE REVIEW  I hereby voluntarily request, consent and authorize [facility] and its employed or contracted physicians, physician  assistants, nurse practitioners or other licensed health care professionals (the Practitioner), to provide me with telemedicine health care services (the "Services") as deemed necessary by the treating Practitioner. I acknowledge and consent to receive the Services by the Practitioner via telemedicine. I understand that the telemedicine visit will involve communicating with the Practitioner through live audiovisual communication technology and the disclosure of certain medical information by electronic transmission. I acknowledge that I have been given the opportunity to request an in-person assessment or other available alternative prior to the telemedicine visit and am voluntarily participating in the telemedicine visit.  I understand that I have the right to withhold or withdraw my consent to the use of telemedicine in the course of my care at any time, without affecting my right to future care or treatment, and that the Practitioner or I may terminate the telemedicine visit at any time. I understand that I have the right to inspect all information obtained and/or recorded in the course of the telemedicine visit and may receive copies of available information for a reasonable fee.  I understand that some of the potential risks of receiving the Services via telemedicine include:  . Delay or interruption in medical evaluation due to technological equipment failure or disruption; . Information transmitted may not be sufficient (e.g. poor resolution of images) to allow for appropriate medical decision making by the Practitioner; and/or  . In rare instances, security protocols could fail, causing a breach of personal health information.  Furthermore, I acknowledge that it is my responsibility to provide information about my medical history, conditions and care that is complete and accurate to the best of my ability. I acknowledge that Practitioner's advice, recommendations, and/or decision may be based on  factors not within their control, such as incomplete or inaccurate data provided by me or distortions of diagnostic images or specimens that may result from electronic transmissions. I understand that the practice of medicine is not an exact science and that Practitioner makes no warranties or guarantees regarding treatment outcomes. I acknowledge that I will receive a copy of this consent concurrently upon execution via email to the email address I last provided but may also request a printed copy by calling the office of [facility].    I understand that my insurance will be billed for this visit.   I have read or had this consent read to me. . I understand the contents of this consent, which adequately explains the benefits and risks of the Services being provided via telemedicine.  . I have been provided ample opportunity to ask questions regarding this consent and the Services and have had my questions answered to my satisfaction. . I give my informed consent for the services to be provided through the use of telemedicine in my medical care  By participating in this telemedicine visit I agree to the above.  

## 2018-09-19 NOTE — Progress Notes (Addendum)
Virtual Visit via Telephone Note   This visit type was conducted due to national recommendations for restrictions regarding the COVID-19 Pandemic (e.g. social distancing) in an effort to limit this patient's exposure and mitigate transmission in our community.  Due to his co-morbid illnesses, this patient is at least at moderate risk for complications without adequate follow up.  This format is felt to be most appropriate for this patient at this time.  All issues noted in this document were discussed and addressed.  A limited physical exam was performed with this format.  Please refer to the patient's chart for his consent to telehealth for [facility].   Evaluation Performed:  Follow-up visit  This visit type was conducted due to national recommendations for restrictions regarding the COVID-19 Pandemic (e.g. social distancing).  This format is felt to be most appropriate for this patient at this time.  All issues noted in this document were discussed and addressed.  No physical exam was performed (except for noted visual exam findings with Video Visits).  Please refer to the patient's chart (MyChart message for video visits and phone note for telephone visits) for the patient's consent to telehealth for [facility].  Date:  09/20/2018   ID:  Isaiah Pena, DOB 27-Aug-1950, MRN [id number]  Patient Location:  Home  Provider location:   [place]  PCP:  Mayra Neer, MD  Cardiologist:  Fransico Him, MD Electrophysiologist:  None   Chief Complaint:  CAD, HTN, OSA, PAF, HLD  History of Present Illness:    Isaiah Pena is a 68 y.o. male who presents via audio/video conferencing for a telehealth visit today.    Isaiah Pena is a 68y.o. male with a hx of CAD s/p PCI of the RCA, HTN, hyperlipidemia, permanent atrial fibrillation and OSA on BiPAP.  He is here today for followup and is doing well.  He denies any chest pain or pressure, SOB, DOE (except for extreme exertion), PND,  orthopnea, LE edema, dizziness, palpitations or syncope. He is compliant with his meds and is tolerating meds with no SE.  He is doing well with his CPAP device and thinks that he has gotten used to it.  He tolerates the mask and feels the pressure is adequate.  Since going on CPAP he feels rested in the am and has no significant daytime sleepiness.  He some mouth but not too bothersome.  He does not think that he snores.    The patient does not have symptoms concerning for COVID-19 infection (fever, chills, cough, or new shortness of breath).   Prior CV studies:   The following studies were reviewed today:  none  Past Medical History:  Diagnosis Date  . Anticoagulant long-term use    xarelto  . Bilateral lower extremity edema   . BPH (benign prostatic hyperplasia)   . BPH (benign prostatic hyperplasia)   . Central serous retinopathy    LOSS OF CENTER VISION LEFT EYE W/ BLURRED VISION-  was treated  methotrexate by specialist at [facility] and released  . Coronary artery disease cardiologist-  dr Tressia Miners turner   01/ 2004  abnormal cardiolite  s/p  cardiac cath w/ PCI and DES to dRCA  . Edema of extremities    CHRONIC   . Erectile dysfunction   . GERD (gastroesophageal reflux disease)   . History of colon polyps    2011 per pt benign  . History of kidney stones   . History of simple renal cyst  drained via radiology  . History of torn meniscus of right knee   . Hyperlipidemia   . Hypertension   . Kidney stone    HX OF STONE-PASSED   . Lower urinary tract symptoms (LUTS)   . OSA (obstructive sleep apnea) 09/29/2011   NPSG 2004:  AHI 25/hr  Rx with bilevel.     . OSA treated with BiPAP    moderate per study 01-23-2002  . Parkinson's disease [facility])    neurologist-  dr ed hill ([facility])  . Permanent atrial fibrillation   . Persistent atrial fibrillation ([place]) 11/08/2012  . S/P drug eluting coronary stent placement    01/ 2004  x1 to dRCA   Past Surgical History:   Procedure Laterality Date  . CARDIOVASCULAR STRESS TEST  05-07-2015   dr Tressia Miners turner   normal nuclear study w/ no ischemia/  normal LV function and wall motion , stress ef 55%  . COLONOSCOPY WITH PROPOFOL  2011  . CORONARY ANGIOPLASTY WITH STENT PLACEMENT  02-11-2002  dr Daneen Schick   abnormal cardiolite--  PCI and DES to dRCA (99%),  pRCA luminal irregularities, mild to moderate LAD and CFX disease,  normal LVF  . CYSTOSCOPY WITH INSERTION OF UROLIFT N/A 01/28/2016   Procedure: CYSTOSCOPY WITH INSERTION OF UROLIFT;  Surgeon: Franchot Gallo, MD;  [facility];  Service: Urology;  Laterality: N/A;  . HERNIA REPAIR     AGE 49  . KNEE ARTHROSCOPY  03/21/2011   Procedure: ARTHROSCOPY KNEE;  Surgeon: Tobi Bastos, MD;  Location: [facility];  Service: Orthopedics;  Laterality: Right;     Current Meds  Medication Sig  . Amantadine HCl ER 68.5 MG CP24 Take 2 capsules at bedtime by mouth.  Marland Kitchen amLODipine-benazepril (LOTREL) 10-40 MG per capsule TAKE CAPSULE CAPSULE BY MOUTH ONCE DAILY (Patient taking differently: TAKE CAPSULE CAPSULE BY MOUTH ONCE DAILY-- takes in am)  . atorvastatin (LIPITOR) 10 MG tablet ALTERNATING TAKING 1/2 TABLET BY MOUTH ON ONE DAY AND 1 TABLET BY MOUTH THE NEXT DAY  . carbidopa-levodopa (SINEMET IR) 25-100 MG tablet Take 2 tablets 3 (three) times daily by mouth.  . entacapone (COMTAN) 200 MG tablet TK 1 T PO TID WITH CARBIDOPA/LEVODOPA  . ezetimibe (ZETIA) 10 MG tablet Take 10 mg by mouth every morning.  Marland Kitchen L-Methylfolate-Algae-B12-B6 (METANX) 3-90.314-2-35 MG CAPS Take 1 tablet by mouth 2 (two) times daily.  . metoprolol (LOPRESSOR) 50 MG tablet Take 50 mg by mouth 2 (two) times daily.  . Omeprazole (PRILOSEC PO) Take 1 tablet by mouth daily.  Marland Kitchen oxybutynin (DITROPAN) 5 MG tablet Take 5 mg by mouth 3 (three) times daily.   . Tadalafil (CIALIS PO) Take 1 tablet by mouth as needed.  . traMADol (ULTRAM) 50 MG tablet Take 1 tablet by mouth as needed.  Alveda Reasons 20 MG TABS tablet TAKE 1 TABLET BY MOUTH EVERY DAY WITH SUPPER     Allergies:   Sulfa antibiotics   Social History   Tobacco Use  . Smoking status: Never Smoker  . Smokeless tobacco: Never Used  Substance Use Topics  . Alcohol use: No  . Drug use: No     Family Hx: The patient's family history is not on file.  ROS:   Please see the history of present illness.     All other systems reviewed and are negative.   Labs/Other Tests and Data Reviewed:    Recent Labs: No results found for requested labs within last 8760  hours.   Recent Lipid Panel Lab Results  Component Value Date/Time   CHOL 106 (L) 11/09/2015 08:58 AM   TRIG 81 11/09/2015 08:58 AM   HDL 45 11/09/2015 08:58 AM   CHOLHDL 2.4 11/09/2015 08:58 AM   LDLCALC 45 11/09/2015 08:58 AM    Wt Readings from Last 3 Encounters:  09/20/18 265 lb (120.2 kg)  02/23/18 272 lb 12.8 oz (123.7 kg)  12/03/16 284 lb 12.8 oz (129.2 kg)     Objective:    Vital Signs:  BP (!) 163/110   Pulse 91   Ht 5\' 8"  (1.727 m)   Wt 265 lb (120.2 kg)   SpO2 97%   BMI 40.29 kg/m     ASSESSMENT & PLAN:    1.  ASCAD s/p PCI of the RCA.  Continues to be angina free.  Continue BB and statin . No ASA due to DOAC.  2.  Permanent atrial fibrillation - his HR is controlled on BB.  No palpitations.  Continue Lopressor and Xarelto. He has not had any problems with excessive bleeding.  I will check a BMET and CBC.    3.  HTN - BP poorly controlled this am.  Continue Lotrel 10-40mg  daily and metoprolol 50mg  BID.  I instructed him to restart HCTZ and call with BP readings in a week.  4.  OSA - the patient is tolerating PAP therapy well without any problems. The PAP download was reviewed today and showed an AHI of 0.4/hr on 14/10 cm H2O with 43% compliance in using more than 4 hours nightly.  The patient has been using and benefiting from CPAP use and will continue to benefit from therapy. I encouraged him to be more compliant with his  device.  5.  Hyperlipidemia with LDL goal < 70.  Continue atorvastatin 10mg  daily and Zetia 10mg  daily.  I will get an FLP and ALT.  6.  LE edema - likely related to sedentary state as well as amlodipine. Controlled on diuretics.    COVID-19 Education: The signs and symptoms of COVID-19 were discussed with the patient and how to seek care for testing (follow up with PCP or arrange E-visit).  The importance of social distancing was discussed today.  Patient Risk:   After full review of this patient's clinical status, I feel that they are at least moderate risk at this time.  Time:   Today, I have spent 20 minutes directly with the patient on telemedicine discussing medical problems including CAD, HTN, OSA, afib, HLD.  We also reviewed the symptoms of COVID 19 and the ways to protect against contracting the virus with telehealth technology.  I spent an additional 5 minutes reviewing patient's chart including labs.  Medication Adjustments/Labs and Tests Ordered: Current medicines are reviewed at length with the patient today.  Concerns regarding medicines are outlined above.  Tests Ordered: No orders of the defined types were placed in this encounter.  Medication Changes: No orders of the defined types were placed in this encounter.   Disposition:  Follow up in 6 month(s) with PA and 1 year with me  Signed, Fransico Him, MD  09/20/2018 10:54 AM    [place]

## 2018-09-20 ENCOUNTER — Encounter: Payer: Self-pay | Admitting: Cardiology

## 2018-09-20 ENCOUNTER — Other Ambulatory Visit: Payer: Self-pay

## 2018-09-20 ENCOUNTER — Telehealth (INDEPENDENT_AMBULATORY_CARE_PROVIDER_SITE_OTHER): Payer: Medicare Other | Admitting: Cardiology

## 2018-09-20 VITALS — BP 163/110 | HR 91 | Ht 68.0 in | Wt 265.0 lb

## 2018-09-20 DIAGNOSIS — R600 Localized edema: Secondary | ICD-10-CM

## 2018-09-20 DIAGNOSIS — E7800 Pure hypercholesterolemia, unspecified: Secondary | ICD-10-CM

## 2018-09-20 DIAGNOSIS — I2510 Atherosclerotic heart disease of native coronary artery without angina pectoris: Secondary | ICD-10-CM

## 2018-09-20 DIAGNOSIS — G4733 Obstructive sleep apnea (adult) (pediatric): Secondary | ICD-10-CM

## 2018-09-20 DIAGNOSIS — I10 Essential (primary) hypertension: Secondary | ICD-10-CM

## 2018-09-20 DIAGNOSIS — I4819 Other persistent atrial fibrillation: Secondary | ICD-10-CM

## 2018-09-20 MED ORDER — HYDROCHLOROTHIAZIDE 25 MG PO TABS: 25.0000 mg | ORAL_TABLET | Freq: Every day | ORAL | 3 refills | Status: DC

## 2018-09-20 NOTE — Patient Instructions (Addendum)
Medication Instructions:  Your physician has recommended you make the following change in your medication:   RESTART: hydrochlorothiazide 25 mg tablet: Take 1 tablet by mouth once a day  Monitor Blood Pressure and call with readings in 1 week   Lab work: Your physician recommends that you return for a FASTING labs: BMET, CBC, FLP, and ALT   If you have labs (blood work) drawn today and your tests are completely normal, you will receive your results only by: Marland Kitchen MyChart Message (if you have MyChart) OR . A paper copy in the mail If you have any lab test that is abnormal or we need to change your treatment, we will call you to review the results.  Testing/Procedures: None ordered  Follow-Up: . Your physician wants you to follow-up in: 6 months with and APP on Dr. Theodosia Blender team. Dennis Bast will receive a reminder letter in the mail two months in advance. If you don't receive a letter, please call our office to schedule the follow-up appointment. . Your physician wants you to follow-up in: 1 year with Dr. Radford Pax. You will receive a reminder letter in the mail two months in advance. If you don't receive a letter, please call our office to schedule the follow-up appointment. .   Any Other Special Instructions Will Be Listed Below (If Applicable).

## 2018-09-20 NOTE — Addendum Note (Signed)
Addended by: Drue Novel I on: 09/20/2018 11:58 AM   Modules accepted: Orders

## 2018-09-20 NOTE — Addendum Note (Signed)
Addended by: Drue Novel I on: 09/20/2018 12:11 PM   Modules accepted: Orders

## 2018-09-24 ENCOUNTER — Other Ambulatory Visit: Payer: Medicare Other | Admitting: *Deleted

## 2018-09-24 ENCOUNTER — Other Ambulatory Visit: Payer: Self-pay

## 2018-09-24 DIAGNOSIS — I4819 Other persistent atrial fibrillation: Secondary | ICD-10-CM | POA: Diagnosis not present

## 2018-09-24 DIAGNOSIS — I2510 Atherosclerotic heart disease of native coronary artery without angina pectoris: Secondary | ICD-10-CM | POA: Diagnosis not present

## 2018-09-24 DIAGNOSIS — G4733 Obstructive sleep apnea (adult) (pediatric): Secondary | ICD-10-CM

## 2018-09-24 DIAGNOSIS — I10 Essential (primary) hypertension: Secondary | ICD-10-CM | POA: Diagnosis not present

## 2018-09-24 DIAGNOSIS — R600 Localized edema: Secondary | ICD-10-CM

## 2018-09-24 DIAGNOSIS — E7800 Pure hypercholesterolemia, unspecified: Secondary | ICD-10-CM

## 2018-09-24 LAB — CBC
Hematocrit: 48.1 % (ref 37.5–51.0)
Hemoglobin: 16.4 g/dL (ref 13.0–17.7)
MCH: 32.2 pg (ref 26.6–33.0)
MCHC: 34.1 g/dL (ref 31.5–35.7)
MCV: 95 fL (ref 79–97)
Platelets: 184 10*3/uL (ref 150–450)
RBC: 5.09 x10E6/uL (ref 4.14–5.80)
RDW: 11.9 % (ref 11.6–15.4)
WBC: 9 10*3/uL (ref 3.4–10.8)

## 2018-09-24 LAB — BASIC METABOLIC PANEL
BUN/Creatinine Ratio: 18 (ref 10–24)
BUN: 14 mg/dL (ref 8–27)
CO2: 26 mmol/L (ref 20–29)
Calcium: 9 mg/dL (ref 8.6–10.2)
Chloride: 104 mmol/L (ref 96–106)
Creatinine, Ser: 0.78 mg/dL (ref 0.76–1.27)
GFR calc Af Amer: 108 mL/min/{1.73_m2} (ref >59)
GFR calc non Af Amer: 93 mL/min/{1.73_m2} (ref >59)
Glucose: 107 mg/dL — ABNORMAL HIGH (ref 65–99)
Potassium: 3.9 mmol/L (ref 3.5–5.2)
Sodium: 145 mmol/L — ABNORMAL HIGH (ref 134–144)

## 2018-09-24 LAB — LIPID PANEL
Chol/HDL Ratio: 2.6 ratio (ref 0.0–5.0)
Cholesterol, Total: 116 mg/dL (ref 100–199)
HDL: 44 mg/dL (ref >39)
LDL Chol Calc (NIH): 59 mg/dL (ref 0–99)
Triglycerides: 60 mg/dL (ref 0–149)
VLDL Cholesterol Cal: 13 mg/dL (ref 5–40)

## 2018-09-24 LAB — ALT: ALT: 8 IU/L (ref 0–44)

## 2018-10-14 DIAGNOSIS — E291 Testicular hypofunction: Secondary | ICD-10-CM | POA: Diagnosis not present

## 2018-11-05 DIAGNOSIS — E291 Testicular hypofunction: Secondary | ICD-10-CM | POA: Diagnosis not present

## 2018-11-05 DIAGNOSIS — Z23 Encounter for immunization: Secondary | ICD-10-CM | POA: Diagnosis not present

## 2018-12-10 DIAGNOSIS — I119 Hypertensive heart disease without heart failure: Secondary | ICD-10-CM | POA: Diagnosis not present

## 2018-12-10 DIAGNOSIS — E782 Mixed hyperlipidemia: Secondary | ICD-10-CM | POA: Diagnosis not present

## 2018-12-10 DIAGNOSIS — R7301 Impaired fasting glucose: Secondary | ICD-10-CM | POA: Diagnosis not present

## 2018-12-10 DIAGNOSIS — I482 Chronic atrial fibrillation, unspecified: Secondary | ICD-10-CM | POA: Diagnosis not present

## 2018-12-10 DIAGNOSIS — E291 Testicular hypofunction: Secondary | ICD-10-CM | POA: Diagnosis not present

## 2018-12-14 DIAGNOSIS — Z7189 Other specified counseling: Secondary | ICD-10-CM | POA: Diagnosis not present

## 2018-12-14 DIAGNOSIS — I482 Chronic atrial fibrillation, unspecified: Secondary | ICD-10-CM | POA: Diagnosis not present

## 2018-12-14 DIAGNOSIS — G20 Parkinson's disease: Secondary | ICD-10-CM | POA: Diagnosis not present

## 2018-12-14 DIAGNOSIS — I119 Hypertensive heart disease without heart failure: Secondary | ICD-10-CM | POA: Diagnosis not present

## 2018-12-14 DIAGNOSIS — E782 Mixed hyperlipidemia: Secondary | ICD-10-CM | POA: Diagnosis not present

## 2018-12-14 DIAGNOSIS — R7301 Impaired fasting glucose: Secondary | ICD-10-CM | POA: Diagnosis not present

## 2018-12-14 DIAGNOSIS — G4733 Obstructive sleep apnea (adult) (pediatric): Secondary | ICD-10-CM | POA: Diagnosis not present

## 2018-12-14 DIAGNOSIS — E291 Testicular hypofunction: Secondary | ICD-10-CM | POA: Diagnosis not present

## 2018-12-14 DIAGNOSIS — Z0000 Encounter for general adult medical examination without abnormal findings: Secondary | ICD-10-CM | POA: Diagnosis not present

## 2018-12-14 DIAGNOSIS — I25119 Atherosclerotic heart disease of native coronary artery with unspecified angina pectoris: Secondary | ICD-10-CM | POA: Diagnosis not present

## 2018-12-14 DIAGNOSIS — Z1211 Encounter for screening for malignant neoplasm of colon: Secondary | ICD-10-CM | POA: Diagnosis not present

## 2019-01-13 ENCOUNTER — Other Ambulatory Visit: Payer: Self-pay | Admitting: Pharmacist

## 2019-01-13 MED ORDER — RIVAROXABAN 20 MG PO TABS: 20.0000 mg | ORAL_TABLET | Freq: Every day | ORAL | 5 refills | Status: DC

## 2019-01-13 NOTE — Progress Notes (Signed)
Age 68, weight 120kg, SCr 0.78 on 09/24/18, CrCl > 100, last OV Aug 2020, afib indication

## 2019-02-04 DIAGNOSIS — Z1211 Encounter for screening for malignant neoplasm of colon: Secondary | ICD-10-CM | POA: Diagnosis not present

## 2019-02-04 DIAGNOSIS — E291 Testicular hypofunction: Secondary | ICD-10-CM | POA: Diagnosis not present

## 2019-02-07 DIAGNOSIS — G214 Vascular parkinsonism: Secondary | ICD-10-CM | POA: Diagnosis not present

## 2019-02-07 DIAGNOSIS — R5381 Other malaise: Secondary | ICD-10-CM | POA: Diagnosis not present

## 2019-02-07 DIAGNOSIS — F419 Anxiety disorder, unspecified: Secondary | ICD-10-CM | POA: Diagnosis not present

## 2019-03-11 DIAGNOSIS — E291 Testicular hypofunction: Secondary | ICD-10-CM | POA: Diagnosis not present

## 2019-04-08 DIAGNOSIS — E291 Testicular hypofunction: Secondary | ICD-10-CM | POA: Diagnosis not present

## 2019-04-14 DIAGNOSIS — G894 Chronic pain syndrome: Secondary | ICD-10-CM | POA: Diagnosis not present

## 2019-04-18 ENCOUNTER — Other Ambulatory Visit: Payer: Self-pay

## 2019-04-18 MED ORDER — ATORVASTATIN CALCIUM 10 MG PO TABS: ORAL_TABLET | ORAL | 2 refills | Status: DC

## 2019-04-18 MED ORDER — ATORVASTATIN CALCIUM 10 MG PO TABS: ORAL_TABLET | ORAL | 0 refills | Status: DC

## 2019-05-09 DIAGNOSIS — E291 Testicular hypofunction: Secondary | ICD-10-CM | POA: Diagnosis not present

## 2019-05-30 NOTE — Progress Notes (Signed)
Virtual Visit via Telephone Note   This visit type was conducted due to national recommendations for restrictions regarding the COVID-19 Pandemic (e.g. social distancing) in an effort to limit this patient's exposure and mitigate transmission in our community.  Due to his co-morbid illnesses, this patient is at least at moderate risk for complications without adequate follow up.  This format is felt to be most appropriate for this patient at this time.  The patient did not have access to video technology/had technical difficulties with video requiring transitioning to audio format only (telephone).  All issues noted in this document were discussed and addressed.  No physical exam could be performed with this format.  Please refer to the patient's chart for his  consent to telehealth for [facility].   The patient was identified using 2 identifiers.  Date:  05/31/2019   ID:  Isaiah Pena, DOB 09/04/1950, MRN [id number]  Patient Location: Home Provider Location: Home  PCP:  Mayra Neer, MD  Cardiologist:  Fransico Him, MD   Electrophysiologist:  None   Evaluation Performed:  Follow-Up Visit  Chief Complaint:  f/u  History of Present Illness:    Isaiah Pena is a 69 y.o. male with a hx of CAD s/p PCI of the RCA 2004,normal NST 2017, HTN, hyperlipidemia, permanent atrial fibrillation on Xarelto and OSA on BiPAP   Patient last saw Dr. Radford Pax 09/20/2018 which time blood pressure was elevated.  HCTZ was restarted, lower extremity edema felt related to sedentary state and amlodipine.  BP up today. Hasn't checked in a couple of weeks. BP was ok at wellness visit with PCP. Says he's watching his salt.Denies chest pain, palpitations, dyspnea, no bleeding problems. Has occasional edema but doing much better. No regular exercise but getting ready to open the pool and will do water aerobics. Has chronic pain from back problems. Also has parkinson's. No longer on Bipap because he was pulling  it off during sleep unknowingly.  Has been vaccinated.    the patient does not have symptoms concerning for COVID-19 infection (fever, chills, cough, or new shortness of breath).    Past Medical History:  Diagnosis Date  . Anticoagulant long-term use    xarelto  . Bilateral lower extremity edema   . BPH (benign prostatic hyperplasia)   . BPH (benign prostatic hyperplasia)   . Central serous retinopathy    LOSS OF CENTER VISION LEFT EYE W/ BLURRED VISION-  was treated  methotrexate by specialist at [facility] and released  . Coronary artery disease cardiologist-  dr Tressia Miners turner   01/ 2004  abnormal cardiolite  s/p  cardiac cath w/ PCI and DES to dRCA  . Edema of extremities    CHRONIC   . Erectile dysfunction   . GERD (gastroesophageal reflux disease)   . History of colon polyps    2011 per pt benign  . History of kidney stones   . History of simple renal cyst    drained via radiology  . History of torn meniscus of right knee   . Hyperlipidemia   . Hypertension   . Kidney stone    HX OF STONE-PASSED   . Lower urinary tract symptoms (LUTS)   . OSA (obstructive sleep apnea) 09/29/2011   NPSG 2004:  AHI 25/hr  Rx with bilevel.     . OSA treated with BiPAP    moderate per study 01-23-2002  . Parkinson's disease [facility])    neurologist-  dr ed hill ([facility])  .  Permanent atrial fibrillation ([place])   . Persistent atrial fibrillation ([place]) 11/08/2012  . S/P drug eluting coronary stent placement    01/ 2004  x1 to dRCA   Past Surgical History:  Procedure Laterality Date  . CARDIOVASCULAR STRESS TEST  05-07-2015   dr Tressia Miners turner   normal nuclear study w/ no ischemia/  normal LV function and wall motion , stress ef 55%  . COLONOSCOPY WITH PROPOFOL  2011  . CORONARY ANGIOPLASTY WITH STENT PLACEMENT  02-11-2002  dr Daneen Schick   abnormal cardiolite--  PCI and DES to dRCA (99%),  pRCA luminal irregularities, mild to moderate LAD and CFX disease,  normal LVF  . CYSTOSCOPY WITH  INSERTION OF UROLIFT N/A 01/28/2016   Procedure: CYSTOSCOPY WITH INSERTION OF UROLIFT;  Surgeon: Franchot Gallo, MD;  Location: [facility];  Service: Urology;  Laterality: N/A;  . HERNIA REPAIR     AGE 48  . KNEE ARTHROSCOPY  03/21/2011   Procedure: ARTHROSCOPY KNEE;  Surgeon: Tobi Bastos, MD;  Location: [facility];  Service: Orthopedics;  Laterality: Right;     Current Meds  Medication Sig  . Amantadine HCl ER 68.5 MG CP24 Take 2 capsules at bedtime by mouth.  Marland Kitchen amLODipine-benazepril (LOTREL) 10-40 MG per capsule TAKE CAPSULE CAPSULE BY MOUTH ONCE DAILY (Patient taking differently: TAKE CAPSULE CAPSULE BY MOUTH ONCE DAILY-- takes in am)  . atorvastatin (LIPITOR) 10 MG tablet ALTERNATING TAKING 1/2 TABLET BY MOUTH ON ONE DAY AND 1 TABLET BY MOUTH THE NEXT DAY  . carbidopa-levodopa (SINEMET IR) 25-100 MG tablet Take 2 tablets 3 (three) times daily by mouth.  . entacapone (COMTAN) 200 MG tablet TK 1 T PO TID WITH CARBIDOPA/LEVODOPA  . ezetimibe (ZETIA) 10 MG tablet Take 10 mg by mouth every morning.  . hydrochlorothiazide (HYDRODIURIL) 25 MG tablet Take 1 tablet (25 mg total) by mouth daily.  . metoprolol (LOPRESSOR) 50 MG tablet Take 50 mg by mouth 2 (two) times daily.  . Omeprazole (PRILOSEC PO) Take 1 tablet by mouth every other day.   . oxybutynin (DITROPAN) 5 MG tablet Take 5 mg by mouth 3 (three) times daily.   . rivaroxaban (XARELTO) 20 MG TABS tablet Take 1 tablet (20 mg total) by mouth daily with supper.  . Tadalafil (CIALIS PO) Take 1 tablet by mouth as needed.  . testosterone cypionate (DEPOTESTOSTERONE CYPIONATE) 200 MG/ML injection Inject 200 mg into the muscle every 30 (thirty) days.  . traMADol (ULTRAM) 50 MG tablet Take 1 tablet by mouth as needed.  . [DISCONTINUED] hydrochlorothiazide (HYDRODIURIL) 25 MG tablet Take 1 tablet (25 mg total) by mouth daily.     Allergies:   Sulfa antibiotics   Social History   Tobacco Use  . Smoking status: Never Smoker  .  Smokeless tobacco: Never Used  Substance Use Topics  . Alcohol use: No  . Drug use: No     Family Hx: The patient's family history is not on file.  ROS:   Please see the history of present illness.      All other systems reviewed and are negative.   Prior CV studies:   The following studies were reviewed today:  NST 05/07/2015 Isaiah Pena GATED SPECT [facility] PERF [facility] STRESS 1D Order# [id number] Reading physician: Josue Hector, MD Ordering physician: Sueanne Margarita, MD Study date: 05/07/15  Patient Information  Name MRN Description  Isaiah Pena "[name]" [id number] 69 y.o. male  Myocardial Perfusion Imaging: Result Notes Older Notes  Notes Recorded by Theodoro Parma, RN on 05/08/2015 at 10:40 AM Informed patient of results and verbal understanding expressed. ------   Notes Recorded by Sueanne Margarita, MD on 05/07/2015 at 10:16 PM Please let patient know that stress test was fine       MyChart Results Release  MyChart Status: Active  Results Release  Vitals  Height Weight BMI (Calculated)  5\' 10"  (1.778 m) 274 lb (124.3 kg) 39.4  Study Highlights   Nuclear stress EF: 55%.  There was no ST segment deviation noted during stress.  The study is normal.  This is a low risk study.  The left ventricular ejection fraction is normal (55-65%).   Normal resting and stress perfusion no ischemia or infarction EF 55%        Labs/Other Tests and Data Reviewed:    EKG:  No ECG reviewed.  Recent Labs: 09/24/2018: ALT 8; BUN 14; Creatinine, Ser 0.78; Hemoglobin 16.4; Platelets 184; Potassium 3.9; Sodium 145   Recent Lipid Panel Lab Results  Component Value Date/Time   CHOL 116 09/24/2018 09:33 AM   TRIG 60 09/24/2018 09:33 AM   HDL 44 09/24/2018 09:33 AM   CHOLHDL 2.6 09/24/2018 09:33 AM   CHOLHDL 2.4 11/09/2015 08:58 AM   LDLCALC 59 09/24/2018 09:33 AM    Wt Readings from Last 3 Encounters:  05/31/19 265 lb (120.2 kg)  09/20/18 265 lb (120.2 kg)    02/23/18 272 lb 12.8 oz (123.7 kg)     Objective:    Vital Signs:  BP (!) 153/104   Pulse 92   Ht 5\' 8"  (1.727 m)   Wt 265 lb (120.2 kg)   SpO2 97%   BMI 40.29 kg/m    VITAL SIGNS:  reviewed  ASSESSMENT & PLAN:    CAD with history of PCI to the RCA in 2004 and due to DOAC-no angina  Permanent atrial fibrillation on Xarelto-no bleeding problems but has had some falls, mostly recently 1 week ago, 3 falls in 1 year. Needs CBC and BMET but will get at PCP  OSA was on BiPAP- but taken away because he was unknowingly taking the mask off.   Essential hypertension-BP running high today but hasn't been checking recently. Would like to check it for 2 weeks and let us know what it's running.  Off BiPAP and chronic pain also contributing.  Hyperlipidemia LDL 59 in 09/2018  COVID-19 Education: The signs and symptoms of COVID-19 were discussed with the patient and how to seek care for testing (follow up with PCP or arrange E-visit).   The importance of social distancing was discussed today.  Time:   Today, I have spent 21:40 minutes with the patient with telehealth technology discussing the above problems.     Medication Adjustments/Labs and Tests Ordered: Current medicines are reviewed at length with the patient today.  Concerns regarding medicines are outlined above.   Tests Ordered: No orders of the defined types were placed in this encounter.   Medication Changes: Meds ordered this encounter  Medications  . hydrochlorothiazide (HYDRODIURIL) 25 MG tablet    Sig: Take 1 tablet (25 mg total) by mouth daily.    Dispense:  90 tablet    Refill:  3    Follow Up:  Virtual Visit  in 6 month(s) Dr. Radford Pax  Signed, Ermalinda Barrios, PA-C  05/31/2019 9:30 AM    [place]

## 2019-05-31 ENCOUNTER — Encounter: Payer: Self-pay | Admitting: Physician Assistant

## 2019-05-31 ENCOUNTER — Other Ambulatory Visit: Payer: Self-pay

## 2019-05-31 ENCOUNTER — Telehealth (INDEPENDENT_AMBULATORY_CARE_PROVIDER_SITE_OTHER): Payer: Medicare Other | Admitting: Physician Assistant

## 2019-05-31 VITALS — BP 153/104 | HR 92 | Ht 68.0 in | Wt 265.0 lb

## 2019-05-31 DIAGNOSIS — E785 Hyperlipidemia, unspecified: Secondary | ICD-10-CM

## 2019-05-31 DIAGNOSIS — I10 Essential (primary) hypertension: Secondary | ICD-10-CM

## 2019-05-31 DIAGNOSIS — I4821 Permanent atrial fibrillation: Secondary | ICD-10-CM

## 2019-05-31 DIAGNOSIS — I2510 Atherosclerotic heart disease of native coronary artery without angina pectoris: Secondary | ICD-10-CM | POA: Diagnosis not present

## 2019-05-31 DIAGNOSIS — G4733 Obstructive sleep apnea (adult) (pediatric): Secondary | ICD-10-CM

## 2019-05-31 MED ORDER — HYDROCHLOROTHIAZIDE 25 MG PO TABS: 25.0000 mg | ORAL_TABLET | Freq: Every day | ORAL | 3 refills | Status: DC

## 2019-05-31 NOTE — Patient Instructions (Signed)
Medication Instructions:  Your physician recommends that you continue on your current medications as directed. Please refer to the Current Medication list given to you today.  *If you need a refill on your cardiac medications before your next appointment, please call your pharmacy*   Lab Work: None ordered  If you have labs (blood work) drawn today and your tests are completely normal, you will receive your results only by: Marland Kitchen MyChart Message (if you have MyChart) OR . A paper copy in the mail If you have any lab test that is abnormal or we need to change your treatment, we will call you to review the results.   Testing/Procedures: None ordered   Follow-Up: At [facility], you and your health needs are our priority.  As part of our continuing mission to provide you with exceptional heart care, we have created designated Provider Care Teams.  These Care Teams include your primary Cardiologist (physician) and Advanced Practice Providers (APPs -  Physician Assistants and Nurse Practitioners) who all work together to provide you with the care you need, when you need it.  We recommend signing up for the patient portal called "MyChart".  Sign up information is provided on this After Visit Summary.  MyChart is used to connect with patients for Virtual Visits (Telemedicine).  Patients are able to view lab/test results, encounter notes, upcoming appointments, etc.  Non-urgent messages can be sent to your provider as well.   To learn more about what you can do with MyChart, go to [email or web address].    Your next appointment:   6 month(s)  The format for your next appointment:   Virtual Visit   Provider:   Fransico Him, MD   Other Instructions  Your physician has requested that you regularly monitor and record your blood pressure readings at home twice a day for 1-2 weeks and send [place] your readings through MyChart.  Selinda Eon will touch base with Dr. Radford Pax regarding your Sleep  Apnea

## 2019-06-03 ENCOUNTER — Telehealth: Payer: Self-pay | Admitting: *Deleted

## 2019-06-03 DIAGNOSIS — G4733 Obstructive sleep apnea (adult) (pediatric): Secondary | ICD-10-CM

## 2019-06-03 NOTE — Telephone Encounter (Signed)
-----   Message from Sueanne Margarita, MD sent at 05/31/2019  9:45 AM EDT ----- Gae Bon what do we have to do to get him back on PAP  Traci ----- Message ----- From: Imogene Burn, PA-C Sent: 05/31/2019   9:30 AM EDT To: Sueanne Margarita, MD  Dr. Radford Pax, this patient says medicare took his Bipap away because he was unknowingly taking it off at night because of his parkinson's. His BP is running high now. Can you offer anything? Thanks

## 2019-06-03 NOTE — Telephone Encounter (Signed)
[facility] picked up his unit on 10/08/2018 for non compliance. He will need a new sleep study to get restarted.

## 2019-06-08 ENCOUNTER — Encounter: Payer: Self-pay | Admitting: *Deleted

## 2019-06-08 DIAGNOSIS — G4733 Obstructive sleep apnea (adult) (pediatric): Secondary | ICD-10-CM

## 2019-06-08 NOTE — Telephone Encounter (Signed)
Split night ordered and sent to sleep pool. °

## 2019-06-08 NOTE — Telephone Encounter (Signed)
This encounter was created in error - please disregard.

## 2019-06-08 NOTE — Telephone Encounter (Signed)
-----   Message from Sueanne Margarita, MD sent at 06/03/2019 12:40 PM EDT ----- Please order split night sleep study  Traci ----- Message ----- From: Freada Bergeron, CMA Sent: 06/03/2019  10:20 AM EDT To: Sueanne Margarita, MD  [facility] picked up his unit on 10/08/2018 for non compliance. He will need a new sleep study to get restarted.  ----- Message ----- From: Sueanne Margarita, MD Sent: 05/31/2019   9:45 AM EDT To: Freada Bergeron, CMA, Imogene Burn, PA-C  Nina what do we have to do to get him back on PAP  Traci ----- Message ----- From: Murrell Converse Sent: 05/31/2019   9:30 AM EDT To: Sueanne Margarita, MD  Dr. Radford Pax, this patient says medicare took his Bipap away because he was unknowingly taking it off at night because of his parkinson's. His BP is running high now. Can you offer anything? Thanks

## 2019-06-08 NOTE — Telephone Encounter (Signed)
-----   Message from Sueanne Margarita, MD sent at 06/03/2019 12:40 PM EDT ----- Please order split night sleep study

## 2019-06-15 DIAGNOSIS — I25119 Atherosclerotic heart disease of native coronary artery with unspecified angina pectoris: Secondary | ICD-10-CM | POA: Diagnosis not present

## 2019-06-15 DIAGNOSIS — G4733 Obstructive sleep apnea (adult) (pediatric): Secondary | ICD-10-CM | POA: Diagnosis not present

## 2019-06-15 DIAGNOSIS — R7309 Other abnormal glucose: Secondary | ICD-10-CM | POA: Diagnosis not present

## 2019-06-15 DIAGNOSIS — R7301 Impaired fasting glucose: Secondary | ICD-10-CM | POA: Diagnosis not present

## 2019-06-15 DIAGNOSIS — I119 Hypertensive heart disease without heart failure: Secondary | ICD-10-CM | POA: Diagnosis not present

## 2019-06-15 DIAGNOSIS — E291 Testicular hypofunction: Secondary | ICD-10-CM | POA: Diagnosis not present

## 2019-06-15 DIAGNOSIS — I482 Chronic atrial fibrillation, unspecified: Secondary | ICD-10-CM | POA: Diagnosis not present

## 2019-06-15 DIAGNOSIS — E782 Mixed hyperlipidemia: Secondary | ICD-10-CM | POA: Diagnosis not present

## 2019-06-19 ENCOUNTER — Telehealth: Payer: Self-pay | Admitting: *Deleted

## 2019-06-19 NOTE — Telephone Encounter (Signed)
Staff message sent to Nina ok to schedule sleep study. No PA is required. Patient has Medicare. 

## 2019-06-19 NOTE — Telephone Encounter (Signed)
-----   Message from Freada Bergeron, [place] sent at 06/08/2019  5:02 PM EDT ----- Regarding: precert Split night

## 2019-06-28 NOTE — Telephone Encounter (Signed)
Patient is scheduled for lab study on 07/10/19. pt is scheduled for COVID screening on 6/18/ 1:20 prior to ss.  Patient understands his sleep study will be done at [facility] sleep lab. Patient understands he will receive a sleep packet in a week or so. Patient understands to call if he does not receive the sleep packet in a timely manner. Patient agrees with treatment and thanked me for call.

## 2019-06-30 DIAGNOSIS — E669 Obesity, unspecified: Secondary | ICD-10-CM | POA: Diagnosis not present

## 2019-06-30 DIAGNOSIS — Z9181 History of falling: Secondary | ICD-10-CM | POA: Diagnosis not present

## 2019-06-30 DIAGNOSIS — I119 Hypertensive heart disease without heart failure: Secondary | ICD-10-CM | POA: Diagnosis not present

## 2019-06-30 DIAGNOSIS — I2510 Atherosclerotic heart disease of native coronary artery without angina pectoris: Secondary | ICD-10-CM | POA: Diagnosis not present

## 2019-06-30 DIAGNOSIS — N39498 Other specified urinary incontinence: Secondary | ICD-10-CM | POA: Diagnosis not present

## 2019-06-30 DIAGNOSIS — Z7901 Long term (current) use of anticoagulants: Secondary | ICD-10-CM | POA: Diagnosis not present

## 2019-06-30 DIAGNOSIS — G20 Parkinson's disease: Secondary | ICD-10-CM | POA: Diagnosis not present

## 2019-06-30 DIAGNOSIS — G4733 Obstructive sleep apnea (adult) (pediatric): Secondary | ICD-10-CM | POA: Diagnosis not present

## 2019-06-30 DIAGNOSIS — E782 Mixed hyperlipidemia: Secondary | ICD-10-CM | POA: Diagnosis not present

## 2019-06-30 DIAGNOSIS — I482 Chronic atrial fibrillation, unspecified: Secondary | ICD-10-CM | POA: Diagnosis not present

## 2019-06-30 DIAGNOSIS — N529 Male erectile dysfunction, unspecified: Secondary | ICD-10-CM | POA: Diagnosis not present

## 2019-06-30 DIAGNOSIS — N401 Enlarged prostate with lower urinary tract symptoms: Secondary | ICD-10-CM | POA: Diagnosis not present

## 2019-06-30 DIAGNOSIS — Z87891 Personal history of nicotine dependence: Secondary | ICD-10-CM | POA: Diagnosis not present

## 2019-06-30 DIAGNOSIS — Z6839 Body mass index (BMI) 39.0-39.9, adult: Secondary | ICD-10-CM | POA: Diagnosis not present

## 2019-06-30 DIAGNOSIS — E291 Testicular hypofunction: Secondary | ICD-10-CM | POA: Diagnosis not present

## 2019-07-01 DIAGNOSIS — E291 Testicular hypofunction: Secondary | ICD-10-CM | POA: Diagnosis not present

## 2019-07-02 ENCOUNTER — Other Ambulatory Visit: Payer: Self-pay | Admitting: Cardiology

## 2019-07-04 NOTE — Telephone Encounter (Signed)
Prescription refill request for Xarelto received.   Last office visit: Isaiah Pena, 05/31/2019 Weight: 120.2 kg Age: 69 y.o. Scr: 0.74, 06/15/2019 CrCl: 138 ml/min   Prescription refill sent.

## 2019-07-08 ENCOUNTER — Other Ambulatory Visit (HOSPITAL_COMMUNITY)
Admission: RE | Admit: 2019-07-08 | Discharge: 2019-07-08 | Disposition: A | Discharge status: Home / Self Care | Payer: Medicare Other | Source: Ambulatory Visit | Attending: Cardiology | Admitting: Cardiology

## 2019-07-08 DIAGNOSIS — N401 Enlarged prostate with lower urinary tract symptoms: Secondary | ICD-10-CM | POA: Diagnosis not present

## 2019-07-08 DIAGNOSIS — Z01812 Encounter for preprocedural laboratory examination: Secondary | ICD-10-CM | POA: Insufficient documentation

## 2019-07-08 DIAGNOSIS — I119 Hypertensive heart disease without heart failure: Secondary | ICD-10-CM | POA: Diagnosis not present

## 2019-07-08 DIAGNOSIS — G4733 Obstructive sleep apnea (adult) (pediatric): Secondary | ICD-10-CM | POA: Diagnosis not present

## 2019-07-08 DIAGNOSIS — I482 Chronic atrial fibrillation, unspecified: Secondary | ICD-10-CM | POA: Diagnosis not present

## 2019-07-08 DIAGNOSIS — E669 Obesity, unspecified: Secondary | ICD-10-CM | POA: Diagnosis not present

## 2019-07-08 DIAGNOSIS — Z20822 Contact with and (suspected) exposure to covid-19: Secondary | ICD-10-CM | POA: Diagnosis not present

## 2019-07-08 DIAGNOSIS — G20 Parkinson's disease: Secondary | ICD-10-CM | POA: Diagnosis not present

## 2019-07-08 LAB — SARS CORONAVIRUS 2 (TAT 6-24 HRS): SARS Coronavirus 2: NEGATIVE

## 2019-07-10 ENCOUNTER — Ambulatory Visit (HOSPITAL_BASED_OUTPATIENT_CLINIC_OR_DEPARTMENT_OTHER): Payer: Medicare Other | Attending: Cardiology | Admitting: Cardiology

## 2019-07-10 ENCOUNTER — Other Ambulatory Visit: Payer: Self-pay

## 2019-07-10 DIAGNOSIS — Z79899 Other long term (current) drug therapy: Secondary | ICD-10-CM | POA: Diagnosis not present

## 2019-07-10 DIAGNOSIS — Z7901 Long term (current) use of anticoagulants: Secondary | ICD-10-CM | POA: Diagnosis not present

## 2019-07-10 DIAGNOSIS — G4733 Obstructive sleep apnea (adult) (pediatric): Secondary | ICD-10-CM | POA: Diagnosis present

## 2019-07-10 DIAGNOSIS — R0902 Hypoxemia: Secondary | ICD-10-CM | POA: Insufficient documentation

## 2019-07-10 DIAGNOSIS — I493 Ventricular premature depolarization: Secondary | ICD-10-CM | POA: Diagnosis not present

## 2019-07-10 DIAGNOSIS — I4891 Unspecified atrial fibrillation: Secondary | ICD-10-CM | POA: Diagnosis not present

## 2019-07-11 NOTE — Procedures (Signed)
   Patient Name: Isaiah Pena, Isaiah Pena Date: 07/10/2019 Gender: Male D.O.B: 1950/05/29 Age (years): 10 Referring Provider: Fransico Him MD, ABSM Height (inches): 69 Interpreting Physician: Fransico Him MD, ABSM Weight (lbs): 264 RPSGT: Zadie Rhine BMI: 30 MRN: [id number] Neck Size: 20.00  CLINICAL INFORMATION Sleep Study Type: NPSG  Indication for sleep study: OSA, Snoring  Epworth Sleepiness Score: 14  SLEEP STUDY TECHNIQUE As per the AASM Manual for the Scoring of Sleep and Associated Events v2.3 (April 2016) with a hypopnea requiring 4% desaturations.  The channels recorded and monitored were frontal, central and occipital EEG, electrooculogram (EOG), submentalis EMG (chin), nasal and oral airflow, thoracic and abdominal wall motion, anterior tibialis EMG, snore microphone, electrocardiogram, and pulse oximetry.  MEDICATIONS Medications self-administered by patient taken the night of the study : KLONOPIN, AMANTADINE, entacapone, Xarelto, OXYBUTYNIN CHLORIDE, METOPROLOL TARTRATE, CARBIDOPA-LEVODOPA, CLONAZEPAM, TRAMADOL  SLEEP ARCHITECTURE The study was initiated at 10:19:06 PM and ended at 4:13:31 AM.  Sleep onset time was 8.8 minutes and the sleep efficiency was 77.2%. The total sleep time was 273.5 minutes.  Stage REM latency was 75.5 minutes.  The patient spent 4.6% of the night in stage N1 sleep, 52.3% in stage N2 sleep, 0.2% in stage N3 and 43% in REM.  Alpha intrusion was absent.  Supine sleep was 100.00%.  RESPIRATORY PARAMETERS The overall apnea/hypopnea index (AHI) was 9.7 per hour. There were 0 total apneas, including 0 obstructive, 0 central and 0 mixed apneas. There were 44 hypopneas and 43 RERAs.  The AHI during Stage REM sleep was 8.7 per hour.  AHI while supine was 9.7 per hour.  The mean oxygen saturation was 92.4%. The minimum SpO2 during sleep was 86.0%.  loud snoring was noted during this study.  CARDIAC DATA The 2 lead EKG demonstrated  atrial fibrillation The mean heart rate was 82.4 beats per minute. Other EKG findings include: PVCs.  LEG MOVEMENT DATA The total PLMS were 0 with a resulting PLMS index of 0.0. Associated arousal with leg movement index was 0.0 .  IMPRESSIONS - Mild obstructive sleep apnea occurred during this study (AHI = 9.7/h). - No significant central sleep apnea occurred during this study (CAI = 0.0/h). - Mild oxygen desaturation was noted during this study (Min O2 = 86.0%). - The patient snored with loud snoring volume. - Atrial fibrillation and PVCs were noted during this study. - Clinically significant periodic limb movements did not occur during sleep. No significant associated arousals.  DIAGNOSIS - Obstructive Sleep Apnea (327.23 [G47.33 ICD-10]) - Nocturnal Hypoxemia (327.26 [G47.36 ICD-10]) - Atrial Fibrillation  RECOMMENDATIONS - Therapeutic CPAP titration to determine optimal pressure required to alleviate sleep disordered breathing. - Positional therapy avoiding supine position during sleep. - Avoid alcohol, sedatives and other CNS depressants that may worsen sleep apnea and disrupt normal sleep architecture. - Sleep hygiene should be reviewed to assess factors that may improve sleep quality. - Weight management and regular exercise should be initiated or continued if appropriate.  [Electronically signed] 07/11/2019 09:10 PM  Fransico Him MD, [place], American Board of Sleep Medicine   NPI: [id number]

## 2019-07-13 DIAGNOSIS — I119 Hypertensive heart disease without heart failure: Secondary | ICD-10-CM | POA: Diagnosis not present

## 2019-07-13 DIAGNOSIS — N401 Enlarged prostate with lower urinary tract symptoms: Secondary | ICD-10-CM | POA: Diagnosis not present

## 2019-07-13 DIAGNOSIS — E669 Obesity, unspecified: Secondary | ICD-10-CM | POA: Diagnosis not present

## 2019-07-13 DIAGNOSIS — I482 Chronic atrial fibrillation, unspecified: Secondary | ICD-10-CM | POA: Diagnosis not present

## 2019-07-13 DIAGNOSIS — G20 Parkinson's disease: Secondary | ICD-10-CM | POA: Diagnosis not present

## 2019-07-13 DIAGNOSIS — G4733 Obstructive sleep apnea (adult) (pediatric): Secondary | ICD-10-CM | POA: Diagnosis not present

## 2019-07-14 ENCOUNTER — Telehealth: Payer: Self-pay | Admitting: *Deleted

## 2019-07-14 DIAGNOSIS — G4733 Obstructive sleep apnea (adult) (pediatric): Secondary | ICD-10-CM

## 2019-07-14 NOTE — Telephone Encounter (Signed)
-----   Message from Sueanne Margarita, MD sent at 07/11/2019  9:12 PM EDT ----- Please let patient know that they have sleep apnea and recommend CPAP titration. Please set up titration in the sleep lab.

## 2019-07-14 NOTE — Telephone Encounter (Signed)
Staff message sent to Isaiah Pena ok to schedule titration study. No PA is required. Patient has Medicare.

## 2019-07-14 NOTE — Telephone Encounter (Signed)
Informed patient of sleep study results and patient understanding was verbalized. Patient understands his sleep study showed they have sleep apnea and recommend CPAP titration. Please set up titration in the sleep lab.   Pt is aware and agreeable to his results. Titration sent sleep pool.

## 2019-07-20 DIAGNOSIS — N401 Enlarged prostate with lower urinary tract symptoms: Secondary | ICD-10-CM | POA: Diagnosis not present

## 2019-07-20 DIAGNOSIS — G4733 Obstructive sleep apnea (adult) (pediatric): Secondary | ICD-10-CM | POA: Diagnosis not present

## 2019-07-20 DIAGNOSIS — I482 Chronic atrial fibrillation, unspecified: Secondary | ICD-10-CM | POA: Diagnosis not present

## 2019-07-20 DIAGNOSIS — E669 Obesity, unspecified: Secondary | ICD-10-CM | POA: Diagnosis not present

## 2019-07-20 DIAGNOSIS — I119 Hypertensive heart disease without heart failure: Secondary | ICD-10-CM | POA: Diagnosis not present

## 2019-07-20 DIAGNOSIS — G20 Parkinson's disease: Secondary | ICD-10-CM | POA: Diagnosis not present

## 2019-07-20 NOTE — Addendum Note (Signed)
Addended by: Freada Bergeron on: 07/20/2019 08:52 AM   Modules accepted: Orders

## 2019-07-26 ENCOUNTER — Telehealth: Payer: Self-pay | Admitting: Physician Assistant

## 2019-07-26 NOTE — Telephone Encounter (Signed)
° ° °  Pt said during his sleep study the tech lift his legs and twisted the skin. He said it is still not healing he went to a walk in clinic and they only replace the dressing. He would like to speak wit Estella Husk to discuss

## 2019-07-26 NOTE — Telephone Encounter (Signed)
Follow Up  Patient returning call. Transferred to April.

## 2019-07-26 NOTE — Telephone Encounter (Signed)
Pt stated that when he was having his sleep study he was trying to get on the bed and the tech that was helping grabbed him around his ankles to lift his legs and it tore the skin on his legs. It has not healed, the skin is tore and has drainage. He went to a walk in clinic and they wrapped it for him. I advised him to call his PCP and have them treat him since we don't normally treat wound care. He just wanted our office to be aware since we ordered the test. I advised him to keep it clean and covered to keep down on infection until he can get it checked.   The 2nd part of his test is July 20 and he isn't sure he will be healed enough to complete that part of his test but I advised him not to cancel it until he sees his PCP. Hopefully he will be able to complete the test at that time.

## 2019-07-26 NOTE — Telephone Encounter (Signed)
LM for pt to call me back to discuss the problem with his legs.

## 2019-07-26 NOTE — Telephone Encounter (Signed)
Patient is scheduled for CPAP Titration on 08/12/19 Patient understands his titration study will be done at [facility] sleep lab. Patient understands he will receive a letter in a week or so detailing appointment, date, time, and location. Patient understands to call if he does not receive the letter  in a timely manner. Patient agrees with treatment and thanked me for call.

## 2019-07-27 NOTE — Telephone Encounter (Signed)
Thanks for letting me know. Agree with PCP f/u and keeping appt for sleep f/u

## 2019-07-28 DIAGNOSIS — I119 Hypertensive heart disease without heart failure: Secondary | ICD-10-CM | POA: Diagnosis not present

## 2019-07-28 DIAGNOSIS — E669 Obesity, unspecified: Secondary | ICD-10-CM | POA: Diagnosis not present

## 2019-07-28 DIAGNOSIS — N401 Enlarged prostate with lower urinary tract symptoms: Secondary | ICD-10-CM | POA: Diagnosis not present

## 2019-07-28 DIAGNOSIS — I482 Chronic atrial fibrillation, unspecified: Secondary | ICD-10-CM | POA: Diagnosis not present

## 2019-07-28 DIAGNOSIS — G20 Parkinson's disease: Secondary | ICD-10-CM | POA: Diagnosis not present

## 2019-07-28 DIAGNOSIS — G4733 Obstructive sleep apnea (adult) (pediatric): Secondary | ICD-10-CM | POA: Diagnosis not present

## 2019-07-30 DIAGNOSIS — G20 Parkinson's disease: Secondary | ICD-10-CM | POA: Diagnosis not present

## 2019-07-30 DIAGNOSIS — Z6839 Body mass index (BMI) 39.0-39.9, adult: Secondary | ICD-10-CM | POA: Diagnosis not present

## 2019-07-30 DIAGNOSIS — E291 Testicular hypofunction: Secondary | ICD-10-CM | POA: Diagnosis not present

## 2019-07-30 DIAGNOSIS — I119 Hypertensive heart disease without heart failure: Secondary | ICD-10-CM | POA: Diagnosis not present

## 2019-07-30 DIAGNOSIS — Z7901 Long term (current) use of anticoagulants: Secondary | ICD-10-CM | POA: Diagnosis not present

## 2019-07-30 DIAGNOSIS — Z9181 History of falling: Secondary | ICD-10-CM | POA: Diagnosis not present

## 2019-07-30 DIAGNOSIS — E669 Obesity, unspecified: Secondary | ICD-10-CM | POA: Diagnosis not present

## 2019-07-30 DIAGNOSIS — N529 Male erectile dysfunction, unspecified: Secondary | ICD-10-CM | POA: Diagnosis not present

## 2019-07-30 DIAGNOSIS — I482 Chronic atrial fibrillation, unspecified: Secondary | ICD-10-CM | POA: Diagnosis not present

## 2019-07-30 DIAGNOSIS — E782 Mixed hyperlipidemia: Secondary | ICD-10-CM | POA: Diagnosis not present

## 2019-07-30 DIAGNOSIS — Z87891 Personal history of nicotine dependence: Secondary | ICD-10-CM | POA: Diagnosis not present

## 2019-07-30 DIAGNOSIS — N401 Enlarged prostate with lower urinary tract symptoms: Secondary | ICD-10-CM | POA: Diagnosis not present

## 2019-07-30 DIAGNOSIS — N39498 Other specified urinary incontinence: Secondary | ICD-10-CM | POA: Diagnosis not present

## 2019-07-30 DIAGNOSIS — G4733 Obstructive sleep apnea (adult) (pediatric): Secondary | ICD-10-CM | POA: Diagnosis not present

## 2019-07-30 DIAGNOSIS — I2510 Atherosclerotic heart disease of native coronary artery without angina pectoris: Secondary | ICD-10-CM | POA: Diagnosis not present

## 2019-08-03 DIAGNOSIS — G20 Parkinson's disease: Secondary | ICD-10-CM | POA: Diagnosis not present

## 2019-08-03 DIAGNOSIS — E669 Obesity, unspecified: Secondary | ICD-10-CM | POA: Diagnosis not present

## 2019-08-03 DIAGNOSIS — G4733 Obstructive sleep apnea (adult) (pediatric): Secondary | ICD-10-CM | POA: Diagnosis not present

## 2019-08-03 DIAGNOSIS — N401 Enlarged prostate with lower urinary tract symptoms: Secondary | ICD-10-CM | POA: Diagnosis not present

## 2019-08-03 DIAGNOSIS — I119 Hypertensive heart disease without heart failure: Secondary | ICD-10-CM | POA: Diagnosis not present

## 2019-08-03 DIAGNOSIS — I482 Chronic atrial fibrillation, unspecified: Secondary | ICD-10-CM | POA: Diagnosis not present

## 2019-08-08 DIAGNOSIS — R5381 Other malaise: Secondary | ICD-10-CM | POA: Diagnosis not present

## 2019-08-08 DIAGNOSIS — F419 Anxiety disorder, unspecified: Secondary | ICD-10-CM | POA: Diagnosis not present

## 2019-08-08 DIAGNOSIS — G214 Vascular parkinsonism: Secondary | ICD-10-CM | POA: Diagnosis not present

## 2019-08-09 ENCOUNTER — Telehealth: Payer: Self-pay | Admitting: *Deleted

## 2019-08-09 DIAGNOSIS — G4733 Obstructive sleep apnea (adult) (pediatric): Secondary | ICD-10-CM

## 2019-08-09 NOTE — Telephone Encounter (Signed)
-----   Message from Sueanne Margarita, MD sent at 08/08/2019  2:07 PM EDT ----- Please  cancel CPAP titration and start on ResMed CPAP on auto from 4-20cm H2O with heated humidity and mask of choice.  Get download in 2 weeks and followup with me in 6 weeks

## 2019-08-09 NOTE — Telephone Encounter (Signed)
Per Dr Radford Pax cpap titration cancelled and order placed for ResMed CPAP on auto from 4-20cm H2O with heated humidity and mask of choice.   DME selection is [facility]. Patient understands  He will be contacted by [facility] to set up his cpap. Patient understands to call if [facility] does not contact him with new setup in a timely manner. Patient understands they will be called once confirmation has been received from [facility] that they have received their new machine to schedule 10 week follow up appointment.  [facility] notified of new cpap order  Please add to airview Patient was grateful for the call and thanked me.

## 2019-08-11 ENCOUNTER — Encounter (HOSPITAL_BASED_OUTPATIENT_CLINIC_OR_DEPARTMENT_OTHER): Payer: Medicare Other | Admitting: Cardiology

## 2019-08-12 DIAGNOSIS — E291 Testicular hypofunction: Secondary | ICD-10-CM | POA: Diagnosis not present

## 2019-08-18 DIAGNOSIS — G20 Parkinson's disease: Secondary | ICD-10-CM | POA: Diagnosis not present

## 2019-08-18 DIAGNOSIS — G4733 Obstructive sleep apnea (adult) (pediatric): Secondary | ICD-10-CM | POA: Diagnosis not present

## 2019-08-18 DIAGNOSIS — N401 Enlarged prostate with lower urinary tract symptoms: Secondary | ICD-10-CM | POA: Diagnosis not present

## 2019-08-18 DIAGNOSIS — E669 Obesity, unspecified: Secondary | ICD-10-CM | POA: Diagnosis not present

## 2019-08-18 DIAGNOSIS — I482 Chronic atrial fibrillation, unspecified: Secondary | ICD-10-CM | POA: Diagnosis not present

## 2019-08-18 DIAGNOSIS — I119 Hypertensive heart disease without heart failure: Secondary | ICD-10-CM | POA: Diagnosis not present

## 2019-08-25 DIAGNOSIS — N401 Enlarged prostate with lower urinary tract symptoms: Secondary | ICD-10-CM | POA: Diagnosis not present

## 2019-08-25 DIAGNOSIS — I482 Chronic atrial fibrillation, unspecified: Secondary | ICD-10-CM | POA: Diagnosis not present

## 2019-08-25 DIAGNOSIS — I119 Hypertensive heart disease without heart failure: Secondary | ICD-10-CM | POA: Diagnosis not present

## 2019-08-25 DIAGNOSIS — G20 Parkinson's disease: Secondary | ICD-10-CM | POA: Diagnosis not present

## 2019-08-25 DIAGNOSIS — G4733 Obstructive sleep apnea (adult) (pediatric): Secondary | ICD-10-CM | POA: Diagnosis not present

## 2019-08-25 DIAGNOSIS — E669 Obesity, unspecified: Secondary | ICD-10-CM | POA: Diagnosis not present

## 2019-08-29 DIAGNOSIS — Z6839 Body mass index (BMI) 39.0-39.9, adult: Secondary | ICD-10-CM | POA: Diagnosis not present

## 2019-08-29 DIAGNOSIS — E782 Mixed hyperlipidemia: Secondary | ICD-10-CM | POA: Diagnosis not present

## 2019-08-29 DIAGNOSIS — G20 Parkinson's disease: Secondary | ICD-10-CM | POA: Diagnosis not present

## 2019-08-29 DIAGNOSIS — Z87891 Personal history of nicotine dependence: Secondary | ICD-10-CM | POA: Diagnosis not present

## 2019-08-29 DIAGNOSIS — N39498 Other specified urinary incontinence: Secondary | ICD-10-CM | POA: Diagnosis not present

## 2019-08-29 DIAGNOSIS — I482 Chronic atrial fibrillation, unspecified: Secondary | ICD-10-CM | POA: Diagnosis not present

## 2019-08-29 DIAGNOSIS — Z7901 Long term (current) use of anticoagulants: Secondary | ICD-10-CM | POA: Diagnosis not present

## 2019-08-29 DIAGNOSIS — I119 Hypertensive heart disease without heart failure: Secondary | ICD-10-CM | POA: Diagnosis not present

## 2019-08-29 DIAGNOSIS — G4733 Obstructive sleep apnea (adult) (pediatric): Secondary | ICD-10-CM | POA: Diagnosis not present

## 2019-08-29 DIAGNOSIS — N401 Enlarged prostate with lower urinary tract symptoms: Secondary | ICD-10-CM | POA: Diagnosis not present

## 2019-08-29 DIAGNOSIS — E669 Obesity, unspecified: Secondary | ICD-10-CM | POA: Diagnosis not present

## 2019-08-29 DIAGNOSIS — I2510 Atherosclerotic heart disease of native coronary artery without angina pectoris: Secondary | ICD-10-CM | POA: Diagnosis not present

## 2019-08-29 DIAGNOSIS — E291 Testicular hypofunction: Secondary | ICD-10-CM | POA: Diagnosis not present

## 2019-08-29 DIAGNOSIS — Z9181 History of falling: Secondary | ICD-10-CM | POA: Diagnosis not present

## 2019-08-29 DIAGNOSIS — N529 Male erectile dysfunction, unspecified: Secondary | ICD-10-CM | POA: Diagnosis not present

## 2019-08-30 DIAGNOSIS — R350 Frequency of micturition: Secondary | ICD-10-CM | POA: Diagnosis not present

## 2019-09-01 DIAGNOSIS — I119 Hypertensive heart disease without heart failure: Secondary | ICD-10-CM | POA: Diagnosis not present

## 2019-09-01 DIAGNOSIS — G4733 Obstructive sleep apnea (adult) (pediatric): Secondary | ICD-10-CM | POA: Diagnosis not present

## 2019-09-01 DIAGNOSIS — N401 Enlarged prostate with lower urinary tract symptoms: Secondary | ICD-10-CM | POA: Diagnosis not present

## 2019-09-01 DIAGNOSIS — G20 Parkinson's disease: Secondary | ICD-10-CM | POA: Diagnosis not present

## 2019-09-01 DIAGNOSIS — I2510 Atherosclerotic heart disease of native coronary artery without angina pectoris: Secondary | ICD-10-CM | POA: Diagnosis not present

## 2019-09-01 DIAGNOSIS — I482 Chronic atrial fibrillation, unspecified: Secondary | ICD-10-CM | POA: Diagnosis not present

## 2019-09-08 DIAGNOSIS — I482 Chronic atrial fibrillation, unspecified: Secondary | ICD-10-CM | POA: Diagnosis not present

## 2019-09-08 DIAGNOSIS — G4733 Obstructive sleep apnea (adult) (pediatric): Secondary | ICD-10-CM | POA: Diagnosis not present

## 2019-09-08 DIAGNOSIS — N401 Enlarged prostate with lower urinary tract symptoms: Secondary | ICD-10-CM | POA: Diagnosis not present

## 2019-09-08 DIAGNOSIS — I2510 Atherosclerotic heart disease of native coronary artery without angina pectoris: Secondary | ICD-10-CM | POA: Diagnosis not present

## 2019-09-08 DIAGNOSIS — G20 Parkinson's disease: Secondary | ICD-10-CM | POA: Diagnosis not present

## 2019-09-08 DIAGNOSIS — I119 Hypertensive heart disease without heart failure: Secondary | ICD-10-CM | POA: Diagnosis not present

## 2019-09-15 DIAGNOSIS — G20 Parkinson's disease: Secondary | ICD-10-CM | POA: Diagnosis not present

## 2019-09-15 DIAGNOSIS — I2510 Atherosclerotic heart disease of native coronary artery without angina pectoris: Secondary | ICD-10-CM | POA: Diagnosis not present

## 2019-09-15 DIAGNOSIS — I119 Hypertensive heart disease without heart failure: Secondary | ICD-10-CM | POA: Diagnosis not present

## 2019-09-15 DIAGNOSIS — I482 Chronic atrial fibrillation, unspecified: Secondary | ICD-10-CM | POA: Diagnosis not present

## 2019-09-15 DIAGNOSIS — N401 Enlarged prostate with lower urinary tract symptoms: Secondary | ICD-10-CM | POA: Diagnosis not present

## 2019-09-15 DIAGNOSIS — G4733 Obstructive sleep apnea (adult) (pediatric): Secondary | ICD-10-CM | POA: Diagnosis not present

## 2019-09-20 DIAGNOSIS — I482 Chronic atrial fibrillation, unspecified: Secondary | ICD-10-CM | POA: Diagnosis not present

## 2019-09-20 DIAGNOSIS — G4733 Obstructive sleep apnea (adult) (pediatric): Secondary | ICD-10-CM | POA: Diagnosis not present

## 2019-09-20 DIAGNOSIS — N401 Enlarged prostate with lower urinary tract symptoms: Secondary | ICD-10-CM | POA: Diagnosis not present

## 2019-09-20 DIAGNOSIS — I2510 Atherosclerotic heart disease of native coronary artery without angina pectoris: Secondary | ICD-10-CM | POA: Diagnosis not present

## 2019-09-20 DIAGNOSIS — G20 Parkinson's disease: Secondary | ICD-10-CM | POA: Diagnosis not present

## 2019-09-20 DIAGNOSIS — I119 Hypertensive heart disease without heart failure: Secondary | ICD-10-CM | POA: Diagnosis not present

## 2019-09-21 NOTE — Telephone Encounter (Signed)
°  Patient Consent for Virtual Visit         Isaiah Pena has provided verbal consent on 09/21/2019 for a virtual visit (video or telephone).   CONSENT FOR VIRTUAL VISIT FOR:  Isaiah Pena  By participating in this virtual visit I agree to the following:  I hereby voluntarily request, consent and authorize [place] and its employed or contracted physicians, physician assistants, nurse practitioners or other licensed health care professionals (the Practitioner), to provide me with telemedicine health care services (the Services") as deemed necessary by the treating Practitioner. I acknowledge and consent to receive the Services by the Practitioner via telemedicine. I understand that the telemedicine visit will involve communicating with the Practitioner through live audiovisual communication technology and the disclosure of certain medical information by electronic transmission. I acknowledge that I have been given the opportunity to request an in-person assessment or other available alternative prior to the telemedicine visit and am voluntarily participating in the telemedicine visit.  I understand that I have the right to withhold or withdraw my consent to the use of telemedicine in the course of my care at any time, without affecting my right to future care or treatment, and that the Practitioner or I may terminate the telemedicine visit at any time. I understand that I have the right to inspect all information obtained and/or recorded in the course of the telemedicine visit and may receive copies of available information for a reasonable fee.  I understand that some of the potential risks of receiving the Services via telemedicine include:   Delay or interruption in medical evaluation due to technological equipment failure or disruption;  Information transmitted may not be sufficient (e.g. poor resolution of images) to allow for appropriate medical decision making by the Practitioner;  and/or   In rare instances, security protocols could fail, causing a breach of personal health information.  Furthermore, I acknowledge that it is my responsibility to provide information about my medical history, conditions and care that is complete and accurate to the best of my ability. I acknowledge that Practitioner's advice, recommendations, and/or decision may be based on factors not within their control, such as incomplete or inaccurate data provided by me or distortions of diagnostic images or specimens that may result from electronic transmissions. I understand that the practice of medicine is not an exact science and that Practitioner makes no warranties or guarantees regarding treatment outcomes. I acknowledge that a copy of this consent can be made available to me via my patient portal ([place]), or I can request a printed copy by calling the office of [facility].    I understand that my insurance will be billed for this visit.   I have read or had this consent read to me.  I understand the contents of this consent, which adequately explains the benefits and risks of the Services being provided via telemedicine.   I have been provided ample opportunity to ask questions regarding this consent and the Services and have had my questions answered to my satisfaction.  I give my informed consent for the services to be provided through the use of telemedicine in my medical care

## 2019-09-21 NOTE — Telephone Encounter (Signed)
Patient has a 10 week follow up appointment scheduled for 10/31/19. Patient understands he needs to keep this appointment for insurance compliance. Patient was grateful for the call and thanked me.

## 2019-09-28 DIAGNOSIS — E291 Testicular hypofunction: Secondary | ICD-10-CM | POA: Diagnosis not present

## 2019-09-28 DIAGNOSIS — I119 Hypertensive heart disease without heart failure: Secondary | ICD-10-CM | POA: Diagnosis not present

## 2019-09-28 DIAGNOSIS — G4733 Obstructive sleep apnea (adult) (pediatric): Secondary | ICD-10-CM | POA: Diagnosis not present

## 2019-09-28 DIAGNOSIS — E669 Obesity, unspecified: Secondary | ICD-10-CM | POA: Diagnosis not present

## 2019-09-28 DIAGNOSIS — Z87891 Personal history of nicotine dependence: Secondary | ICD-10-CM | POA: Diagnosis not present

## 2019-09-28 DIAGNOSIS — I2510 Atherosclerotic heart disease of native coronary artery without angina pectoris: Secondary | ICD-10-CM | POA: Diagnosis not present

## 2019-09-28 DIAGNOSIS — N401 Enlarged prostate with lower urinary tract symptoms: Secondary | ICD-10-CM | POA: Diagnosis not present

## 2019-09-28 DIAGNOSIS — N529 Male erectile dysfunction, unspecified: Secondary | ICD-10-CM | POA: Diagnosis not present

## 2019-09-28 DIAGNOSIS — N39498 Other specified urinary incontinence: Secondary | ICD-10-CM | POA: Diagnosis not present

## 2019-09-28 DIAGNOSIS — G20 Parkinson's disease: Secondary | ICD-10-CM | POA: Diagnosis not present

## 2019-09-28 DIAGNOSIS — Z9181 History of falling: Secondary | ICD-10-CM | POA: Diagnosis not present

## 2019-09-28 DIAGNOSIS — Z6839 Body mass index (BMI) 39.0-39.9, adult: Secondary | ICD-10-CM | POA: Diagnosis not present

## 2019-09-28 DIAGNOSIS — Z7901 Long term (current) use of anticoagulants: Secondary | ICD-10-CM | POA: Diagnosis not present

## 2019-09-28 DIAGNOSIS — I482 Chronic atrial fibrillation, unspecified: Secondary | ICD-10-CM | POA: Diagnosis not present

## 2019-09-28 DIAGNOSIS — E782 Mixed hyperlipidemia: Secondary | ICD-10-CM | POA: Diagnosis not present

## 2019-10-06 ENCOUNTER — Other Ambulatory Visit: Payer: Self-pay | Admitting: Cardiology

## 2019-10-06 DIAGNOSIS — I2510 Atherosclerotic heart disease of native coronary artery without angina pectoris: Secondary | ICD-10-CM | POA: Diagnosis not present

## 2019-10-06 DIAGNOSIS — I119 Hypertensive heart disease without heart failure: Secondary | ICD-10-CM | POA: Diagnosis not present

## 2019-10-06 DIAGNOSIS — N401 Enlarged prostate with lower urinary tract symptoms: Secondary | ICD-10-CM | POA: Diagnosis not present

## 2019-10-06 DIAGNOSIS — G20 Parkinson's disease: Secondary | ICD-10-CM | POA: Diagnosis not present

## 2019-10-06 DIAGNOSIS — G4733 Obstructive sleep apnea (adult) (pediatric): Secondary | ICD-10-CM | POA: Diagnosis not present

## 2019-10-06 DIAGNOSIS — I482 Chronic atrial fibrillation, unspecified: Secondary | ICD-10-CM | POA: Diagnosis not present

## 2019-10-06 MED ORDER — ATORVASTATIN CALCIUM 10 MG PO TABS: ORAL_TABLET | ORAL | 2 refills | Status: DC

## 2019-10-12 DIAGNOSIS — I119 Hypertensive heart disease without heart failure: Secondary | ICD-10-CM | POA: Diagnosis not present

## 2019-10-12 DIAGNOSIS — N401 Enlarged prostate with lower urinary tract symptoms: Secondary | ICD-10-CM | POA: Diagnosis not present

## 2019-10-12 DIAGNOSIS — I482 Chronic atrial fibrillation, unspecified: Secondary | ICD-10-CM | POA: Diagnosis not present

## 2019-10-12 DIAGNOSIS — G20 Parkinson's disease: Secondary | ICD-10-CM | POA: Diagnosis not present

## 2019-10-12 DIAGNOSIS — I2510 Atherosclerotic heart disease of native coronary artery without angina pectoris: Secondary | ICD-10-CM | POA: Diagnosis not present

## 2019-10-12 DIAGNOSIS — G4733 Obstructive sleep apnea (adult) (pediatric): Secondary | ICD-10-CM | POA: Diagnosis not present

## 2019-10-17 DIAGNOSIS — I119 Hypertensive heart disease without heart failure: Secondary | ICD-10-CM | POA: Diagnosis not present

## 2019-10-17 DIAGNOSIS — G20 Parkinson's disease: Secondary | ICD-10-CM | POA: Diagnosis not present

## 2019-10-17 DIAGNOSIS — I2510 Atherosclerotic heart disease of native coronary artery without angina pectoris: Secondary | ICD-10-CM | POA: Diagnosis not present

## 2019-10-17 DIAGNOSIS — N401 Enlarged prostate with lower urinary tract symptoms: Secondary | ICD-10-CM | POA: Diagnosis not present

## 2019-10-17 DIAGNOSIS — G4733 Obstructive sleep apnea (adult) (pediatric): Secondary | ICD-10-CM | POA: Diagnosis not present

## 2019-10-17 DIAGNOSIS — I482 Chronic atrial fibrillation, unspecified: Secondary | ICD-10-CM | POA: Diagnosis not present

## 2019-10-29 DIAGNOSIS — Z23 Encounter for immunization: Secondary | ICD-10-CM | POA: Diagnosis not present

## 2019-10-31 ENCOUNTER — Telehealth: Payer: Medicare Other | Admitting: Cardiology

## 2019-11-11 ENCOUNTER — Other Ambulatory Visit: Payer: Self-pay

## 2019-11-11 ENCOUNTER — Encounter: Payer: Self-pay | Admitting: Cardiology

## 2019-11-11 ENCOUNTER — Telehealth (INDEPENDENT_AMBULATORY_CARE_PROVIDER_SITE_OTHER): Payer: Medicare Other | Admitting: Cardiology

## 2019-11-11 VITALS — BP 162/94 | HR 94 | Ht 69.0 in | Wt 264.0 lb

## 2019-11-11 DIAGNOSIS — R600 Localized edema: Secondary | ICD-10-CM | POA: Diagnosis not present

## 2019-11-11 DIAGNOSIS — I2510 Atherosclerotic heart disease of native coronary artery without angina pectoris: Secondary | ICD-10-CM

## 2019-11-11 DIAGNOSIS — I4819 Other persistent atrial fibrillation: Secondary | ICD-10-CM

## 2019-11-11 DIAGNOSIS — E7800 Pure hypercholesterolemia, unspecified: Secondary | ICD-10-CM

## 2019-11-11 DIAGNOSIS — G4733 Obstructive sleep apnea (adult) (pediatric): Secondary | ICD-10-CM

## 2019-11-11 DIAGNOSIS — I10 Essential (primary) hypertension: Secondary | ICD-10-CM | POA: Diagnosis not present

## 2019-11-11 NOTE — Addendum Note (Signed)
Addended by: Antonieta Iba on: 11/11/2019 08:25 AM   Modules accepted: Orders

## 2019-11-11 NOTE — Patient Instructions (Signed)
Medication Instructions:  Your physician recommends that you continue on your current medications as directed. Please refer to the Current Medication list given to you today.  *If you need a refill on your cardiac medications before your next appointment, please call your pharmacy*   Lab Work: Fasting lipids, CMET, CBC If you have labs (blood work) drawn today and your tests are completely normal, you will receive your results only by:  [place] (if you have MyChart) OR  A paper copy in the mail If you have any lab test that is abnormal or we need to change your treatment, we will call you to review the results.  Follow-Up: At [facility], you and your health needs are our priority.  As part of our continuing mission to provide you with exceptional heart care, we have created designated Provider Care Teams.  These Care Teams include your primary Cardiologist (physician) and Advanced Practice Providers (APPs -  Physician Assistants and Nurse Practitioners) who all work together to provide you with the care you need, when you need it.  Your next appointment:   1 year(s)  The format for your next appointment:   In Person  Provider:   You may see Fransico Him, MD or one of the following Advanced Practice Providers on your designated Care Team:    Melina Copa, PA-C  Ermalinda Barrios, PA-C

## 2019-11-11 NOTE — Progress Notes (Signed)
Virtual Visit via Telephone Note   This visit type was conducted due to national recommendations for restrictions regarding the COVID-19 Pandemic (e.g. social distancing) in an effort to limit this patient's exposure and mitigate transmission in our community.  Due to his co-morbid illnesses, this patient is at least at moderate risk for complications without adequate follow up.  This format is felt to be most appropriate for this patient at this time.  All issues noted in this document were discussed and addressed.  A limited physical exam was performed with this format.  Please refer to the patient's chart for his consent to telehealth for [facility].   Evaluation Performed:  Follow-up visit  This visit type was conducted due to national recommendations for restrictions regarding the COVID-19 Pandemic (e.g. social distancing).  This format is felt to be most appropriate for this patient at this time.  All issues noted in this document were discussed and addressed.  No physical exam was performed (except for noted visual exam findings with Video Visits).  Please refer to the patient's chart (MyChart message for video visits and phone note for telephone visits) for the patient's consent to telehealth for [facility].  Date:  11/11/2019   ID:  Isaiah Pena, DOB Aug 25, 1950, MRN [id number]  Patient Location:  Home  Provider location:   [place]  PCP:  Mayra Neer, MD  Cardiologist:  Fransico Him, MD Electrophysiologist:  None   Chief Complaint:  CAD, HTN, OSA, PAF, HLD  History of Present Illness:    Isaiah Pena is a 69 y.o. male who presents via audio/video conferencing for a telehealth visit today.    Isaiah Pena is a 69y.o. male with a hx of CAD s/p PCI of the RCA, HTN, hyperlipidemia, permanent atrial fibrillation and OSA on BiPAP. Unfortunately he was not compliant with his PAP device and it was taken away.  In order to get a new device he had to repeat a sleep  study.  Repeat sleep study showed mild OSA with an AHI of 9.7/hr and O2 sats as low as 86% with nocturnal hypoxemia.  He was started on auto CPAP from 4 to 20cm H2O.    He is here today for followup and is doing well.  He continues to struggle with his Parkinson's.  He denies any chest pain or pressure, SOB, DOE, PND, orthopnea, LE edema, dizziness, palpitations or syncope. He is compliant with his meds and is tolerating meds with no SE.  He is doing well with his CPAP device and thinks that he has gotten used to it.  He tolerates the mask and feels the pressure is adequate.  Since going on CPAP he feels rested in the am and has no significant daytime sleepiness.  He denies any significant mouth or nasal dryness or nasal congestion.  He does not think that he snores.    The patient does not have symptoms concerning for COVID-19 infection (fever, chills, cough, or new shortness of breath).   Prior CV studies:   The following studies were reviewed today:  Sleep study and PAP compliance download  Past Medical History:  Diagnosis Date  . Anticoagulant long-term use    xarelto  . Bilateral lower extremity edema   . BPH (benign prostatic hyperplasia)   . BPH (benign prostatic hyperplasia)   . Central serous retinopathy    LOSS OF CENTER VISION LEFT EYE W/ BLURRED VISION-  was treated  methotrexate by specialist at [facility] and released  .  Coronary artery disease cardiologist-  dr Tressia Miners Iveliz Garay   01/ 2004  abnormal cardiolite  s/p  cardiac cath w/ PCI and DES to dRCA  . Edema of extremities    CHRONIC   . Erectile dysfunction   . GERD (gastroesophageal reflux disease)   . History of colon polyps    2011 per pt benign  . History of kidney stones   . History of simple renal cyst    drained via radiology  . History of torn meniscus of right knee   . Hyperlipidemia   . Hypertension   . Kidney stone    HX OF STONE-PASSED   . Lower urinary tract symptoms (LUTS)   . OSA (obstructive sleep apnea)  09/29/2011   NPSG 2004:  AHI 25/hr  Rx with bilevel.     . OSA treated with BiPAP    moderate per study 01-23-2002  . Parkinson's disease [facility])    neurologist-  dr ed hill ([place] [facility])  . Permanent atrial fibrillation ([place])   . Persistent atrial fibrillation ([place]) 11/08/2012  . S/P drug eluting coronary stent placement    01/ 2004  x1 to dRCA   Past Surgical History:  Procedure Laterality Date  . CARDIOVASCULAR STRESS TEST  05-07-2015   dr Tressia Miners Oisin Yoakum   normal nuclear study w/ no ischemia/  normal LV function and wall motion , stress ef 55%  . COLONOSCOPY WITH PROPOFOL  2011  . CORONARY ANGIOPLASTY WITH STENT PLACEMENT  02-11-2002  dr Daneen Schick   abnormal cardiolite--  PCI and DES to dRCA (99%),  pRCA luminal irregularities, mild to moderate LAD and CFX disease,  normal LVF  . CYSTOSCOPY WITH INSERTION OF UROLIFT N/A 01/28/2016   Procedure: CYSTOSCOPY WITH INSERTION OF UROLIFT;  Surgeon: Franchot Gallo, MD;  Location[facility];  Service: Urology;  Laterality: N/A;  . HERNIA REPAIR     AGE 63  . KNEE ARTHROSCOPY  03/21/2011   Procedure: ARTHROSCOPY KNEE;  Surgeon: Tobi Bastos, MD;  Location: [facility];  Service: Orthopedics;  Laterality: Right;     Current Meds  Medication Sig  . Amantadine HCl ER 68.5 MG CP24 Take 2 capsules at bedtime by mouth.  Marland Kitchen amLODipine-benazepril (LOTREL) 10-40 MG per capsule TAKE CAPSULE CAPSULE BY MOUTH ONCE DAILY (Patient taking differently: TAKE CAPSULE CAPSULE BY MOUTH ONCE DAILY-- takes in am)  . atorvastatin (LIPITOR) 10 MG tablet ALTERNATING TAKING 1/2 TABLET BY MOUTH ON ONE DAY AND 1 TABLET BY MOUTH THE NEXT DAY  . carbidopa-levodopa (SINEMET IR) 25-100 MG tablet Take 2 tablets 3 (three) times daily by mouth.  . entacapone (COMTAN) 200 MG tablet TK 1 T PO TID WITH CARBIDOPA/LEVODOPA  . ezetimibe (ZETIA) 10 MG tablet Take 10 mg by mouth every morning.  . hydrochlorothiazide (HYDRODIURIL) 25 MG tablet Take 1 tablet (25 mg total)  by mouth daily.  . metoprolol (LOPRESSOR) 50 MG tablet Take 50 mg by mouth 2 (two) times daily.  . Omeprazole (PRILOSEC PO) Take 1 tablet by mouth every other day.   . oxybutynin (DITROPAN) 5 MG tablet Take 5 mg by mouth 3 (three) times daily.   . Tadalafil (CIALIS PO) Take 1 tablet by mouth as needed.  . testosterone cypionate (DEPOTESTOSTERONE CYPIONATE) 200 MG/ML injection Inject 200 mg into the muscle every 30 (thirty) days.  . traMADol (ULTRAM) 50 MG tablet Take 1 tablet by mouth as needed.  Alveda Reasons 20 MG TABS tablet TAKE 1 TABLET(20 MG) BY MOUTH DAILY WITH  SUPPER     Allergies:   Sulfa antibiotics   Social History   Tobacco Use  . Smoking status: Never Smoker  . Smokeless tobacco: Never Used  Substance Use Topics  . Alcohol use: No  . Drug use: No     Family Hx: The patient's family history is not on file.  ROS:   Please see the history of present illness.     All other systems reviewed and are negative.   Labs/Other Tests and Data Reviewed:    Recent Labs: No results found for requested labs within last 8760 hours.   Recent Lipid Panel Lab Results  Component Value Date/Time   CHOL 116 09/24/2018 09:33 AM   TRIG 60 09/24/2018 09:33 AM   HDL 44 09/24/2018 09:33 AM   CHOLHDL 2.6 09/24/2018 09:33 AM   CHOLHDL 2.4 11/09/2015 08:58 AM   LDLCALC 59 09/24/2018 09:33 AM    Wt Readings from Last 3 Encounters:  11/11/19 264 lb (119.7 kg)  07/10/19 264 lb (119.7 kg)  05/31/19 265 lb (120.2 kg)     Objective:    Vital Signs:  BP (!) 162/94   Pulse 94   Ht 5\' 9"  (1.753 m)   Wt 264 lb (119.7 kg)   SpO2 97%   BMI 38.99 kg/m     ASSESSMENT & PLAN:    1.  ASCAD  -s/p PCI of the RCA.   -he denies any anginal symptoms -continue BB and statin -he is not on ASA due to DOAC  2.  Permanent atrial fibrillation  -HR is well controlled -continue Lopressor 50mg  BID and Xarelto 20mg  daily -he has not had any bleeding problems on DOAC -check BMET and CBC  3.   HTN  -BP elevated on exam today but he has not taken his BP yet and he also has white coat HTN.  At home his BP runs 130/80's -continue Lotrel 10-40mg  daily and metoprolol 50mg  BID.    4.  OSA -   The patient is tolerating PAP therapy well without any problems. The PAP download was reviewed today and showed an AHI of 1.2/hr on auto PAP with 40% compliance in using more than 4 hours nightly.  The patient has been using and benefiting from PAP use and will continue to benefit from therapy.  -his compliance was down because someone at his DME forgot to send his supplies -I have encouraged him to be more compliant with his device  5.  Hyperlipidemia  -LDL goal < 70.   -check FLP and ALT -continue atorvastatin 10mg  daily and Zetia 10mg  daily  6.  LE edema  - like-controlled on diuretics  COVID-19 Education: The signs and symptoms of COVID-19 were discussed with the patient and how to seek care for testing (follow up with PCP or arrange E-visit).  The importance of social distancing was discussed today.  Patient Risk:   After full review of this patient's clinical status, I feel that they are at least moderate risk at this time.  Time:   Today, I have spent 20 minutes directly with the patient on telemedicine discussing medical problems including CAD, HTN, OSA, afib, HLD and reviewing patient's chart including labs and sleep study and PAP compliance download  Medication Adjustments/Labs and Tests Ordered: Current medicines are reviewed at length with the patient today.  Concerns regarding medicines are outlined above.  Tests Ordered: No orders of the defined types were placed in this encounter.  Medication Changes: No orders of the defined  types were placed in this encounter.   Disposition:  Follow up 1 year  Signed, Fransico Him, MD  11/11/2019 8:14 AM    [place]

## 2019-11-14 DIAGNOSIS — Z23 Encounter for immunization: Secondary | ICD-10-CM | POA: Diagnosis not present

## 2019-11-17 ENCOUNTER — Other Ambulatory Visit: Payer: Self-pay

## 2019-11-17 ENCOUNTER — Other Ambulatory Visit: Payer: Medicare Other | Admitting: *Deleted

## 2019-11-17 DIAGNOSIS — E7800 Pure hypercholesterolemia, unspecified: Secondary | ICD-10-CM

## 2019-11-17 DIAGNOSIS — I4819 Other persistent atrial fibrillation: Secondary | ICD-10-CM | POA: Diagnosis not present

## 2019-11-17 DIAGNOSIS — I10 Essential (primary) hypertension: Secondary | ICD-10-CM

## 2019-11-18 LAB — COMPREHENSIVE METABOLIC PANEL
ALT: 10 IU/L (ref 0–44)
AST: 15 IU/L (ref 0–40)
Albumin/Globulin Ratio: 1.8 (ref 1.2–2.2)
Albumin: 4.4 g/dL (ref 3.8–4.8)
Alkaline Phosphatase: 114 IU/L (ref 44–121)
BUN/Creatinine Ratio: 17 (ref 10–24)
BUN: 14 mg/dL (ref 8–27)
Bilirubin Total: 0.8 mg/dL (ref 0.0–1.2)
CO2: 27 mmol/L (ref 20–29)
Calcium: 9 mg/dL (ref 8.6–10.2)
Chloride: 105 mmol/L (ref 96–106)
Creatinine, Ser: 0.81 mg/dL (ref 0.76–1.27)
GFR calc Af Amer: 106 mL/min/{1.73_m2} (ref >59)
GFR calc non Af Amer: 91 mL/min/{1.73_m2} (ref >59)
Globulin, Total: 2.4 g/dL (ref 1.5–4.5)
Glucose: 95 mg/dL (ref 65–99)
Potassium: 3.7 mmol/L (ref 3.5–5.2)
Sodium: 146 mmol/L — ABNORMAL HIGH (ref 134–144)
Total Protein: 6.8 g/dL (ref 6.0–8.5)

## 2019-11-18 LAB — LIPID PANEL
Chol/HDL Ratio: 2.6 ratio (ref 0.0–5.0)
Cholesterol, Total: 121 mg/dL (ref 100–199)
HDL: 46 mg/dL (ref >39)
LDL Chol Calc (NIH): 61 mg/dL (ref 0–99)
Triglycerides: 64 mg/dL (ref 0–149)
VLDL Cholesterol Cal: 14 mg/dL (ref 5–40)

## 2019-11-18 LAB — CBC
Hematocrit: 47.4 % (ref 37.5–51.0)
Hemoglobin: 15.8 g/dL (ref 13.0–17.7)
MCH: 30.7 pg (ref 26.6–33.0)
MCHC: 33.3 g/dL (ref 31.5–35.7)
MCV: 92 fL (ref 79–97)
Platelets: 159 10*3/uL (ref 150–450)
RBC: 5.15 x10E6/uL (ref 4.14–5.80)
RDW: 16 % — ABNORMAL HIGH (ref 11.6–15.4)
WBC: 8.9 10*3/uL (ref 3.4–10.8)

## 2019-11-29 ENCOUNTER — Other Ambulatory Visit: Payer: Self-pay | Admitting: Cardiology

## 2019-11-29 NOTE — Telephone Encounter (Signed)
Pt last saw Dr Radford Pax 11/11/19, last labs 11/17/19 Creat 0.81, age 68, weight 119.7kg, CrCl 147.78, based on CrCl pt is on appropriate dosage of Xarelto 20mg  QD.  Will refill rx.

## 2020-01-05 DIAGNOSIS — F411 Generalized anxiety disorder: Secondary | ICD-10-CM | POA: Diagnosis not present

## 2020-01-05 DIAGNOSIS — I119 Hypertensive heart disease without heart failure: Secondary | ICD-10-CM | POA: Diagnosis not present

## 2020-01-05 DIAGNOSIS — R7301 Impaired fasting glucose: Secondary | ICD-10-CM | POA: Diagnosis not present

## 2020-01-05 DIAGNOSIS — Z125 Encounter for screening for malignant neoplasm of prostate: Secondary | ICD-10-CM | POA: Diagnosis not present

## 2020-01-05 DIAGNOSIS — E782 Mixed hyperlipidemia: Secondary | ICD-10-CM | POA: Diagnosis not present

## 2020-01-05 DIAGNOSIS — G4733 Obstructive sleep apnea (adult) (pediatric): Secondary | ICD-10-CM | POA: Diagnosis not present

## 2020-01-05 DIAGNOSIS — G20 Parkinson's disease: Secondary | ICD-10-CM | POA: Diagnosis not present

## 2020-01-05 DIAGNOSIS — Z713 Dietary counseling and surveillance: Secondary | ICD-10-CM | POA: Diagnosis not present

## 2020-01-05 DIAGNOSIS — D6869 Other thrombophilia: Secondary | ICD-10-CM | POA: Diagnosis not present

## 2020-01-05 DIAGNOSIS — I25119 Atherosclerotic heart disease of native coronary artery with unspecified angina pectoris: Secondary | ICD-10-CM | POA: Diagnosis not present

## 2020-01-05 DIAGNOSIS — Z0000 Encounter for general adult medical examination without abnormal findings: Secondary | ICD-10-CM | POA: Diagnosis not present

## 2020-01-09 DIAGNOSIS — N3946 Mixed incontinence: Secondary | ICD-10-CM | POA: Diagnosis not present

## 2020-01-09 DIAGNOSIS — R351 Nocturia: Secondary | ICD-10-CM | POA: Diagnosis not present

## 2020-01-09 DIAGNOSIS — R972 Elevated prostate specific antigen [PSA]: Secondary | ICD-10-CM | POA: Diagnosis not present

## 2020-01-09 DIAGNOSIS — N401 Enlarged prostate with lower urinary tract symptoms: Secondary | ICD-10-CM | POA: Diagnosis not present

## 2020-02-29 DIAGNOSIS — F419 Anxiety disorder, unspecified: Secondary | ICD-10-CM | POA: Diagnosis not present

## 2020-02-29 DIAGNOSIS — G214 Vascular parkinsonism: Secondary | ICD-10-CM | POA: Diagnosis not present

## 2020-02-29 DIAGNOSIS — R5381 Other malaise: Secondary | ICD-10-CM | POA: Diagnosis not present

## 2020-03-05 DIAGNOSIS — Z5181 Encounter for therapeutic drug level monitoring: Secondary | ICD-10-CM | POA: Diagnosis not present

## 2020-03-05 DIAGNOSIS — M5459 Other low back pain: Secondary | ICD-10-CM | POA: Diagnosis not present

## 2020-03-05 DIAGNOSIS — M5416 Radiculopathy, lumbar region: Secondary | ICD-10-CM | POA: Diagnosis not present

## 2020-03-05 DIAGNOSIS — G20 Parkinson's disease: Secondary | ICD-10-CM | POA: Diagnosis not present

## 2020-03-05 DIAGNOSIS — R32 Unspecified urinary incontinence: Secondary | ICD-10-CM | POA: Diagnosis not present

## 2020-03-05 DIAGNOSIS — Z79899 Other long term (current) drug therapy: Secondary | ICD-10-CM | POA: Diagnosis not present

## 2020-03-22 DIAGNOSIS — M5416 Radiculopathy, lumbar region: Secondary | ICD-10-CM | POA: Diagnosis not present

## 2020-03-22 DIAGNOSIS — M5459 Other low back pain: Secondary | ICD-10-CM | POA: Diagnosis not present

## 2020-04-02 DIAGNOSIS — M48061 Spinal stenosis, lumbar region without neurogenic claudication: Secondary | ICD-10-CM | POA: Diagnosis not present

## 2020-04-02 DIAGNOSIS — M47816 Spondylosis without myelopathy or radiculopathy, lumbar region: Secondary | ICD-10-CM | POA: Diagnosis not present

## 2020-04-06 DIAGNOSIS — R972 Elevated prostate specific antigen [PSA]: Secondary | ICD-10-CM | POA: Diagnosis not present

## 2020-04-13 DIAGNOSIS — N401 Enlarged prostate with lower urinary tract symptoms: Secondary | ICD-10-CM | POA: Diagnosis not present

## 2020-04-13 DIAGNOSIS — R3915 Urgency of urination: Secondary | ICD-10-CM | POA: Diagnosis not present

## 2020-04-13 DIAGNOSIS — R972 Elevated prostate specific antigen [PSA]: Secondary | ICD-10-CM | POA: Diagnosis not present

## 2020-04-13 DIAGNOSIS — R351 Nocturia: Secondary | ICD-10-CM | POA: Diagnosis not present

## 2020-04-13 DIAGNOSIS — N3946 Mixed incontinence: Secondary | ICD-10-CM | POA: Diagnosis not present

## 2020-04-19 DIAGNOSIS — M47816 Spondylosis without myelopathy or radiculopathy, lumbar region: Secondary | ICD-10-CM | POA: Diagnosis not present

## 2020-04-24 DIAGNOSIS — M5459 Other low back pain: Secondary | ICD-10-CM | POA: Diagnosis not present

## 2020-05-02 DIAGNOSIS — Z23 Encounter for immunization: Secondary | ICD-10-CM | POA: Diagnosis not present

## 2020-05-27 ENCOUNTER — Emergency Department (HOSPITAL_COMMUNITY)
Admission: EM | Admit: 2020-05-27 | Discharge: 2020-05-27 | Disposition: A | Discharge status: Home / Self Care | Payer: Medicare Other | Attending: Emergency Medicine | Admitting: Emergency Medicine

## 2020-05-27 ENCOUNTER — Emergency Department (HOSPITAL_COMMUNITY): Payer: Medicare Other

## 2020-05-27 DIAGNOSIS — S0003XA Contusion of scalp, initial encounter: Secondary | ICD-10-CM | POA: Diagnosis not present

## 2020-05-27 DIAGNOSIS — Z7901 Long term (current) use of anticoagulants: Secondary | ICD-10-CM | POA: Insufficient documentation

## 2020-05-27 DIAGNOSIS — M2602 Maxillary hypoplasia: Secondary | ICD-10-CM | POA: Diagnosis not present

## 2020-05-27 DIAGNOSIS — Y9301 Activity, walking, marching and hiking: Secondary | ICD-10-CM | POA: Insufficient documentation

## 2020-05-27 DIAGNOSIS — S0291XA Unspecified fracture of skull, initial encounter for closed fracture: Secondary | ICD-10-CM | POA: Diagnosis not present

## 2020-05-27 DIAGNOSIS — S0101XA Laceration without foreign body of scalp, initial encounter: Secondary | ICD-10-CM | POA: Diagnosis not present

## 2020-05-27 DIAGNOSIS — G20 Parkinson's disease: Secondary | ICD-10-CM | POA: Insufficient documentation

## 2020-05-27 DIAGNOSIS — W19XXXA Unspecified fall, initial encounter: Secondary | ICD-10-CM

## 2020-05-27 DIAGNOSIS — R58 Hemorrhage, not elsewhere classified: Secondary | ICD-10-CM | POA: Diagnosis not present

## 2020-05-27 DIAGNOSIS — I6782 Cerebral ischemia: Secondary | ICD-10-CM | POA: Insufficient documentation

## 2020-05-27 DIAGNOSIS — M2578 Osteophyte, vertebrae: Secondary | ICD-10-CM | POA: Diagnosis not present

## 2020-05-27 DIAGNOSIS — I4891 Unspecified atrial fibrillation: Secondary | ICD-10-CM | POA: Insufficient documentation

## 2020-05-27 DIAGNOSIS — S0990XA Unspecified injury of head, initial encounter: Secondary | ICD-10-CM | POA: Diagnosis present

## 2020-05-27 DIAGNOSIS — W108XXA Fall (on) (from) other stairs and steps, initial encounter: Secondary | ICD-10-CM | POA: Diagnosis not present

## 2020-05-27 DIAGNOSIS — J3489 Other specified disorders of nose and nasal sinuses: Secondary | ICD-10-CM | POA: Diagnosis not present

## 2020-05-27 DIAGNOSIS — I10 Essential (primary) hypertension: Secondary | ICD-10-CM | POA: Diagnosis not present

## 2020-05-27 DIAGNOSIS — S199XXA Unspecified injury of neck, initial encounter: Secondary | ICD-10-CM | POA: Diagnosis not present

## 2020-05-27 DIAGNOSIS — J320 Chronic maxillary sinusitis: Secondary | ICD-10-CM | POA: Diagnosis not present

## 2020-05-27 DIAGNOSIS — R42 Dizziness and giddiness: Secondary | ICD-10-CM | POA: Diagnosis not present

## 2020-05-27 DIAGNOSIS — M4602 Spinal enthesopathy, cervical region: Secondary | ICD-10-CM | POA: Diagnosis not present

## 2020-05-27 DIAGNOSIS — E041 Nontoxic single thyroid nodule: Secondary | ICD-10-CM | POA: Insufficient documentation

## 2020-05-27 LAB — CBC WITH DIFFERENTIAL/PLATELET
Abs Immature Granulocytes: 0.1 10*3/uL — ABNORMAL HIGH (ref 0.00–0.07)
Basophils Absolute: 0.1 10*3/uL (ref 0.0–0.1)
Basophils Relative: 1 %
Eosinophils Absolute: 0.2 10*3/uL (ref 0.0–0.5)
Eosinophils Relative: 2 %
HCT: 49.6 % (ref 39.0–52.0)
Hemoglobin: 17.1 g/dL — ABNORMAL HIGH (ref 13.0–17.0)
Immature Granulocytes: 1 %
Lymphocytes Relative: 23 %
Lymphs Abs: 2.5 10*3/uL (ref 0.7–4.0)
MCH: 33.7 pg (ref 26.0–34.0)
MCHC: 34.5 g/dL (ref 30.0–36.0)
MCV: 97.6 fL (ref 80.0–100.0)
Monocytes Absolute: 0.8 10*3/uL (ref 0.1–1.0)
Monocytes Relative: 7 %
Neutro Abs: 7 10*3/uL (ref 1.7–7.7)
Neutrophils Relative %: 66 %
Platelets: 171 10*3/uL (ref 150–400)
RBC: 5.08 MIL/uL (ref 4.22–5.81)
RDW: 13.2 % (ref 11.5–15.5)
WBC: 10.6 10*3/uL — ABNORMAL HIGH (ref 4.0–10.5)
nRBC: 0 % (ref 0.0–0.2)

## 2020-05-27 LAB — BASIC METABOLIC PANEL
Anion gap: 9 (ref 5–15)
BUN: 15 mg/dL (ref 8–23)
CO2: 27 mmol/L (ref 22–32)
Calcium: 9.1 mg/dL (ref 8.9–10.3)
Chloride: 106 mmol/L (ref 98–111)
Creatinine, Ser: 0.8 mg/dL (ref 0.61–1.24)
GFR, Estimated: >60 mL/min (ref >60)
Glucose, Bld: 119 mg/dL — ABNORMAL HIGH (ref 70–99)
Potassium: 3.7 mmol/L (ref 3.5–5.1)
Sodium: 142 mmol/L (ref 135–145)

## 2020-05-27 LAB — CBG MONITORING, ED: Glucose-Capillary: 131 mg/dL — ABNORMAL HIGH (ref 70–99)

## 2020-05-27 MED ORDER — MECLIZINE HCL 25 MG PO TABS
25.0000 mg | ORAL_TABLET | Freq: Once | ORAL | Status: AC
  Administered 2020-05-27: 25 mg via ORAL
  Filled 2020-05-27: qty 1

## 2020-05-27 MED ORDER — LIDOCAINE-EPINEPHRINE-TETRACAINE (LET) TOPICAL GEL
3.0000 mL | Freq: Once | TOPICAL | Status: AC
  Administered 2020-05-27: 3 mL via TOPICAL
  Filled 2020-05-27: qty 3

## 2020-05-27 MED ORDER — ONDANSETRON 4 MG PO TBDP
4.0000 mg | ORAL_TABLET | Freq: Once | ORAL | Status: AC
  Administered 2020-05-27: 4 mg via ORAL
  Filled 2020-05-27: qty 1

## 2020-05-27 MED ORDER — ACETAMINOPHEN 325 MG PO TABS
650.0000 mg | ORAL_TABLET | Freq: Once | ORAL | Status: AC
  Administered 2020-05-27: 650 mg via ORAL
  Filled 2020-05-27: qty 2

## 2020-05-27 MED ORDER — LIDOCAINE-EPINEPHRINE (PF) 2 %-1:200000 IJ SOLN
10.0000 mL | Freq: Once | INTRAMUSCULAR | Status: AC
  Administered 2020-05-27: 10 mL
  Filled 2020-05-27: qty 20

## 2020-05-27 NOTE — ED Provider Notes (Signed)
Isaiah Pena   CSN: [id number] Arrival date & time: 05/27/20  1412     History Chief Complaint  Patient presents with  . Fall    On blood thinner     Isaiah Pena is a 70 y.o. male.  70 year old male on Xarelto for A. fib presents with head injury after a fall.  Patient states that he was walking out in the carport when he fell down approximately 4 steps and hit the back of his head on the step.  No loss of consciousness.  No other injuries.  Last tetanus was less than 5 years ago.        No past medical history on file.  There are no problems to display for this patient.   No family history on file.     Home Medications Prior to Admission medications   Not on File    Allergies    Patient has no allergy information on record.  Review of Systems   Review of Systems  Constitutional: Negative for fever.  Respiratory: Negative for shortness of breath.   Cardiovascular: Negative for chest pain.  Gastrointestinal: Positive for nausea. Negative for abdominal pain and vomiting.  Musculoskeletal: Negative for back pain, neck pain and neck stiffness.  Skin: Positive for wound.  Neurological: Positive for headaches. Negative for weakness.  Hematological: Bruises/bleeds easily.  Psychiatric/Behavioral: Negative for confusion.  All other systems reviewed and are negative.   Physical Exam Updated Vital Signs BP (!) 141/96   Pulse 86   Temp 97.6 F (36.4 C) (Oral)   Resp 17   Ht 5\' 8"  (1.727 m)   Wt 118.8 kg   SpO2 97%   BMI 39.84 kg/m   Physical Exam Vitals and nursing Pena reviewed.  Constitutional:      General: He is not in acute distress.    Appearance: He is well-developed. He is not diaphoretic.  HENT:     Head: Normocephalic.      Mouth/Throat:     Mouth: Mucous membranes are moist.  Eyes:     Extraocular Movements: Extraocular movements intact.     Pupils: Pupils are equal, round, and reactive  to light.  Cardiovascular:     Rate and Rhythm: Normal rate and regular rhythm.     Heart sounds: Normal heart sounds.  Pulmonary:     Effort: Pulmonary effort is normal.     Breath sounds: Normal breath sounds.  Abdominal:     Palpations: Abdomen is soft.     Tenderness: There is no abdominal tenderness.  Musculoskeletal:        General: No swelling, tenderness or deformity.     Cervical back: Normal range of motion and neck supple. No tenderness.     Right lower leg: No edema.     Left lower leg: No edema.  Skin:    General: Skin is warm and dry.     Findings: No erythema or rash.  Neurological:     Mental Status: He is alert and oriented to person, place, and time.  Psychiatric:        Behavior: Behavior normal.     ED Results / Procedures / Treatments   Labs (all labs ordered are listed, but only abnormal results are displayed) Labs Reviewed  BASIC METABOLIC PANEL - Abnormal; Notable for the following components:      Result Value   Glucose, Bld 119 (*)    All other components within normal limits  CBC WITH DIFFERENTIAL/PLATELET - Abnormal; Notable for the following components:   WBC 10.6 (*)    Hemoglobin 17.1 (*)    Abs Immature Granulocytes 0.10 (*)    All other components within normal limits    EKG None  Radiology CT Head Wo Contrast  Result Date: 05/27/2020 CLINICAL DATA:  Fall. Patient on blood thinners. Patient fell at home walking upstairs. Patient struck his head on concrete. No reported loss of consciousness. Patient takes Xarelto. EXAM: CT HEAD WITHOUT CONTRAST CT CERVICAL SPINE WITHOUT CONTRAST TECHNIQUE: Multidetector CT imaging of the head and cervical spine was performed following the standard protocol without intravenous contrast. Multiplanar CT image reconstructions of the cervical spine were also generated. COMPARISON:  None. FINDINGS: CT HEAD FINDINGS Brain: No evidence of acute infarction, hemorrhage, hydrocephalus, extra-axial collection or mass  lesion/mass effect. Patchy areas of white matter hypoattenuation are noted consistent with mild chronic microvascular ischemic change. Vascular: No hyperdense vessel or unexpected calcification. Skull: Normal. Negative for fracture or focal lesion. Sinuses/Orbits: Globes and orbits are unremarkable. Hypoplastic left maxillary sinus with mucosal thickening. Remaining sinuses are clear. Other: Small posterior right parietal scalp contusion/hematoma. CT CERVICAL SPINE FINDINGS Alignment: Straightened cervical lordosis.  No spondylolisthesis. Skull base and vertebrae: No acute fracture. No primary bone lesion or focal pathologic process. Soft tissues and spinal canal: No prevertebral fluid or swelling. No visible canal hematoma. Disc levels: Moderate loss of disc height with endplate spurring at D34-534. Remaining disc spaces are relatively well preserved. Mild disc bulging at C5-C6. No disc herniation. Upper chest: No acute findings. 1.7 cm left thyroid nodule. Clear lung apices. Other: None. IMPRESSION: HEAD CT 1. No acute intracranial abnormalities. 2. Mild chronic microvascular ischemic change. 3. No skull fracture. 4. Small posterior right parietal scalp contusion/hematoma. CERVICAL CT 1. No fracture or acute finding. 2. 1.7 cm left thyroid nodule. Recommend thyroid US (ref: J Am Coll Radiol. 2015 Feb;12(2): 143-50). Electronically Signed   By: Lajean Manes M.D.   On: 05/27/2020 15:05   CT Cervical Spine Wo Contrast  Result Date: 05/27/2020 CLINICAL DATA:  Fall. Patient on blood thinners. Patient fell at home walking upstairs. Patient struck his head on concrete. No reported loss of consciousness. Patient takes Xarelto. EXAM: CT HEAD WITHOUT CONTRAST CT CERVICAL SPINE WITHOUT CONTRAST TECHNIQUE: Multidetector CT imaging of the head and cervical spine was performed following the standard protocol without intravenous contrast. Multiplanar CT image reconstructions of the cervical spine were also generated.  COMPARISON:  None. FINDINGS: CT HEAD FINDINGS Brain: No evidence of acute infarction, hemorrhage, hydrocephalus, extra-axial collection or mass lesion/mass effect. Patchy areas of white matter hypoattenuation are noted consistent with mild chronic microvascular ischemic change. Vascular: No hyperdense vessel or unexpected calcification. Skull: Normal. Negative for fracture or focal lesion. Sinuses/Orbits: Globes and orbits are unremarkable. Hypoplastic left maxillary sinus with mucosal thickening. Remaining sinuses are clear. Other: Small posterior right parietal scalp contusion/hematoma. CT CERVICAL SPINE FINDINGS Alignment: Straightened cervical lordosis.  No spondylolisthesis. Skull base and vertebrae: No acute fracture. No primary bone lesion or focal pathologic process. Soft tissues and spinal canal: No prevertebral fluid or swelling. No visible canal hematoma. Disc levels: Moderate loss of disc height with endplate spurring at D34-534. Remaining disc spaces are relatively well preserved. Mild disc bulging at C5-C6. No disc herniation. Upper chest: No acute findings. 1.7 cm left thyroid nodule. Clear lung apices. Other: None. IMPRESSION: HEAD CT 1. No acute intracranial abnormalities. 2. Mild chronic microvascular ischemic change. 3. No skull fracture. 4. Small  posterior right parietal scalp contusion/hematoma. CERVICAL CT 1. No fracture or acute finding. 2. 1.7 cm left thyroid nodule. Recommend thyroid US (ref: J Am Coll Radiol. 2015 Feb;12(2): 143-50). Electronically Signed   By: Lajean Manes M.D.   On: 05/27/2020 15:05    Procedures .Marland KitchenLaceration Repair  Date/Time: 05/27/2020 3:33 PM Performed by: Tacy Learn, PA-C Authorized by: Tacy Learn, PA-C   Consent:    Consent obtained:  Verbal   Consent given by:  Patient   Risks discussed:  Infection, need for additional repair, pain, poor cosmetic result and poor wound healing   Alternatives discussed:  No treatment and delayed  treatment Universal protocol:    Procedure explained and questions answered to patient or proxy's satisfaction: yes     Relevant documents present and verified: yes     Test results available: yes     Imaging studies available: yes     Required blood products, implants, devices, and special equipment available: yes     Site/side marked: yes     Immediately prior to procedure, a time out was called: yes     Patient identity confirmed:  Verbally with patient Anesthesia:    Anesthesia method:  Local infiltration   Local anesthetic:  Lidocaine 2% WITH epi Laceration details:    Location:  Scalp   Length (cm):  3   Depth (mm):  4 Pre-procedure details:    Preparation:  Patient was prepped and draped in usual sterile fashion and imaging obtained to evaluate for foreign bodies Exploration:    Limited defect created (wound extended): no   Treatment:    Area cleansed with:  Saline   Amount of cleaning:  Standard   Irrigation solution:  Sterile saline   Debridement:  None Skin repair:    Repair method:  Staples   Number of staples:  3 Approximation:    Approximation:  Close Repair type:    Repair type:  Simple Post-procedure details:    Dressing:  Bulky dressing   Procedure completion:  Tolerated well, no immediate complications     Medications Ordered in ED Medications  lidocaine-EPINEPHrine (XYLOCAINE W/EPI) 2 %-1:200000 (PF) injection 10 mL (10 mLs Infiltration Given by Other 05/27/20 1522)  lidocaine-EPINEPHrine-tetracaine (LET) topical gel (3 mLs Topical Given by Other 05/27/20 1523)    ED Course  I have reviewed the triage vital signs and the nursing notes.  Pertinent labs & imaging results that were available during my care of the patient were reviewed by me and considered in my medical decision making (see chart for details).  Clinical Course as of 05/27/20 1615  Sun May 08, 328  3616 70 year old male brought in by EMS for fall down steps hitting the back of his head  without loss of consciousness however is anticoagulated with Xarelto. .  Patient was made a level 2 trauma due to head injury on blood thinners and to the CT scanner for CT head and C-spine which are negative for significant or acute injury beyond hematoma in the area of his laceration. Area was anesthetized lidocaine with epi, irrigated and closed with 3 staples.  Patient tolerated procedure well.  Labs including CBC and BMP unremarkable.  Vital signs stable.  Patient is discharged with plan for wound check with PCP in 2 days for staple removal in 7 days.  Given return to ER precautions. Discussed with Dr. Vallery Ridge, ER attending who agrees with plan of care. [LM]  1612 Upon dc, patient feeling dizzy and unsteady. Given  Zofran for nausea. Call to radiology, images reviewed by neuroradiologist, no concerns for head bleed on repeat read.  Plan is for meclizine and Tylenol, will have on coming provider recheck for discharge.  [LM]    Clinical Course User Index [LM] Roque Lias   MDM Rules/Calculators/A&P                          Final Clinical Impression(s) / ED Diagnoses Final diagnoses:  Fall, initial encounter  Laceration of scalp, initial encounter    Rx / DC Orders ED Discharge Orders    None       Roque Lias 05/27/20 1535    Charlesetta Shanks, MD 05/29/20 [phone]

## 2020-05-27 NOTE — Discharge Instructions (Addendum)
Recheck with your doctor for wound check in 2 days, staple removal in 7 days.

## 2020-05-27 NOTE — ED Notes (Signed)
This RN to room to get pt ready for discharge, assisted pt to a sitting position on the bed, pt c/o dizziness and nauseas, informs RN he does not feel he can stand up. Pt has hx of Parkinson. Assisted pt back to laying in the bed, Notified EDP .

## 2020-05-27 NOTE — Progress Notes (Signed)
   05/27/20 1412  Clinical Encounter Type  Visit Type Trauma  Referral From Nurse  Consult/Referral To Chaplain  Chaplain responded to 70 year old male fall on thinners.  Pt was alert and stable. No family members present, and Chaplain was not needed.     Chaplain Aidan Moten [place]  [phone]

## 2020-05-27 NOTE — ED Notes (Signed)
Pt to CT

## 2020-05-27 NOTE — ED Triage Notes (Signed)
Pt to ED via EMS from home c/o fall on blood thinners. Pt was walking up steps, lost his balance  and fell backwards, [place] 4 stairs, hit his head on concrete. No LOC, a&ox4. Pt takes xerelto. Hx"Parkinsons, CHF, Afib. No medications given by EMS. Head Laceration, currently bandaged and bleeding controlled. Last VS: 154/106, 109, CBG 129, 96%

## 2020-05-27 NOTE — ED Notes (Signed)
Pt d/c home per MD order. Discharge summary reviewed, pt verbalizes understanding. Off unit via WC. Discharged home with spouse.

## 2020-05-27 NOTE — TOC Initial Note (Signed)
Transition of Care [facility]) - Initial/Assessment Note    Patient Details  Name: Cornelious Bartolucci MRN: [id number] Date of Birth: 1950-03-18  Transition of Care [facility]) CM/SW Contact:    Verdell Carmine, RN Phone Number: 05/27/2020, 5:21 PM  Clinical Narrative:                  Reached out to Tera Partridge to speak about resources for in home are and resources  For m[facility] [place] left confidential messages at [phone]       Patient Goals and CMS Choice        Expected Discharge Plan and Services                                                Prior Living Arrangements/Services                       Activities of Daily Living      Permission Sought/Granted                  Emotional Assessment              Admission diagnosis:  (23) Level II; Fall on Thinners; Small Head Lac There are no problems to display for this patient.  PCP:  Mayra Neer, MD Pharmacy:  No Pharmacies Listed    Social Determinants of Health (SDOH) Interventions    Readmission Risk Interventions No flowsheet data found.

## 2020-06-04 DIAGNOSIS — Z4802 Encounter for removal of sutures: Secondary | ICD-10-CM | POA: Diagnosis not present

## 2020-06-04 DIAGNOSIS — S0191XA Laceration without foreign body of unspecified part of head, initial encounter: Secondary | ICD-10-CM | POA: Diagnosis not present

## 2020-06-22 DIAGNOSIS — N401 Enlarged prostate with lower urinary tract symptoms: Secondary | ICD-10-CM | POA: Diagnosis not present

## 2020-06-22 DIAGNOSIS — R3915 Urgency of urination: Secondary | ICD-10-CM | POA: Diagnosis not present

## 2020-06-22 DIAGNOSIS — N3946 Mixed incontinence: Secondary | ICD-10-CM | POA: Diagnosis not present

## 2020-06-22 DIAGNOSIS — R351 Nocturia: Secondary | ICD-10-CM | POA: Diagnosis not present

## 2020-06-25 DIAGNOSIS — M47816 Spondylosis without myelopathy or radiculopathy, lumbar region: Secondary | ICD-10-CM | POA: Diagnosis not present

## 2020-06-29 ENCOUNTER — Other Ambulatory Visit: Payer: Self-pay | Admitting: Cardiology

## 2020-06-29 MED ORDER — ATORVASTATIN CALCIUM 10 MG PO TABS
ORAL_TABLET | ORAL | 1 refills | Status: DC
Start: 2020-06-29 — End: 2021-02-05

## 2020-07-16 ENCOUNTER — Other Ambulatory Visit: Payer: Self-pay | Admitting: Cardiology

## 2020-07-16 DIAGNOSIS — E782 Mixed hyperlipidemia: Secondary | ICD-10-CM | POA: Diagnosis not present

## 2020-07-16 DIAGNOSIS — E291 Testicular hypofunction: Secondary | ICD-10-CM | POA: Diagnosis not present

## 2020-07-16 DIAGNOSIS — R7301 Impaired fasting glucose: Secondary | ICD-10-CM | POA: Diagnosis not present

## 2020-07-16 DIAGNOSIS — I119 Hypertensive heart disease without heart failure: Secondary | ICD-10-CM | POA: Diagnosis not present

## 2020-07-16 DIAGNOSIS — G20 Parkinson's disease: Secondary | ICD-10-CM | POA: Diagnosis not present

## 2020-07-16 NOTE — Telephone Encounter (Signed)
Age 70, weight 119kg ,SCr 0.8 05/27/20, CrCl > 100 LOV 10/2019, afib

## 2020-07-19 DIAGNOSIS — M47816 Spondylosis without myelopathy or radiculopathy, lumbar region: Secondary | ICD-10-CM | POA: Diagnosis not present

## 2020-08-14 DIAGNOSIS — Z79891 Long term (current) use of opiate analgesic: Secondary | ICD-10-CM | POA: Diagnosis not present

## 2020-08-14 DIAGNOSIS — M47817 Spondylosis without myelopathy or radiculopathy, lumbosacral region: Secondary | ICD-10-CM | POA: Diagnosis not present

## 2020-09-20 DIAGNOSIS — H1089 Other conjunctivitis: Secondary | ICD-10-CM | POA: Diagnosis not present

## 2020-09-20 DIAGNOSIS — H00022 Hordeolum internum right lower eyelid: Secondary | ICD-10-CM | POA: Diagnosis not present

## 2020-10-26 DIAGNOSIS — M47816 Spondylosis without myelopathy or radiculopathy, lumbar region: Secondary | ICD-10-CM | POA: Diagnosis not present

## 2020-10-26 DIAGNOSIS — M47817 Spondylosis without myelopathy or radiculopathy, lumbosacral region: Secondary | ICD-10-CM | POA: Diagnosis not present

## 2020-11-01 DIAGNOSIS — Z23 Encounter for immunization: Secondary | ICD-10-CM | POA: Diagnosis not present

## 2020-11-05 DIAGNOSIS — Z23 Encounter for immunization: Secondary | ICD-10-CM | POA: Diagnosis not present

## 2020-11-07 DIAGNOSIS — N3946 Mixed incontinence: Secondary | ICD-10-CM | POA: Diagnosis not present

## 2020-11-07 DIAGNOSIS — R351 Nocturia: Secondary | ICD-10-CM | POA: Diagnosis not present

## 2020-11-07 DIAGNOSIS — N401 Enlarged prostate with lower urinary tract symptoms: Secondary | ICD-10-CM | POA: Diagnosis not present

## 2020-11-29 DIAGNOSIS — Z20822 Contact with and (suspected) exposure to covid-19: Secondary | ICD-10-CM | POA: Diagnosis not present

## 2021-01-16 ENCOUNTER — Other Ambulatory Visit: Payer: Self-pay | Admitting: Cardiology

## 2021-01-16 DIAGNOSIS — I4819 Other persistent atrial fibrillation: Secondary | ICD-10-CM

## 2021-01-16 NOTE — Telephone Encounter (Addendum)
Xarelto 20mg  refill request received. Pt is 70 years old, weight-118.8kg, Crea-0.80 on 05/27/2020, last seen by Dr. Radford Pax on 11/11/2019 via Telemedicine-PT NEEDS AN APPT, Diagnosis-Afib, CrCl-146.76ml/min; Dose is appropriate based on dosing criteria.   Pt needs an appt with Dr. Radford Pax. Will send a message to the scheduler.   Pt has an appt with Dr. Radford Pax on 05/02/2021; will send in a refill to requested pharmacy.

## 2021-01-18 DIAGNOSIS — I482 Chronic atrial fibrillation, unspecified: Secondary | ICD-10-CM | POA: Diagnosis not present

## 2021-01-18 DIAGNOSIS — N3281 Overactive bladder: Secondary | ICD-10-CM | POA: Diagnosis not present

## 2021-01-18 DIAGNOSIS — F411 Generalized anxiety disorder: Secondary | ICD-10-CM | POA: Diagnosis not present

## 2021-01-18 DIAGNOSIS — I119 Hypertensive heart disease without heart failure: Secondary | ICD-10-CM | POA: Diagnosis not present

## 2021-01-18 DIAGNOSIS — Z0000 Encounter for general adult medical examination without abnormal findings: Secondary | ICD-10-CM | POA: Diagnosis not present

## 2021-01-18 DIAGNOSIS — D6869 Other thrombophilia: Secondary | ICD-10-CM | POA: Diagnosis not present

## 2021-01-18 DIAGNOSIS — E291 Testicular hypofunction: Secondary | ICD-10-CM | POA: Diagnosis not present

## 2021-01-18 DIAGNOSIS — I25119 Atherosclerotic heart disease of native coronary artery with unspecified angina pectoris: Secondary | ICD-10-CM | POA: Diagnosis not present

## 2021-01-18 DIAGNOSIS — G20 Parkinson's disease: Secondary | ICD-10-CM | POA: Diagnosis not present

## 2021-01-18 DIAGNOSIS — R7301 Impaired fasting glucose: Secondary | ICD-10-CM | POA: Diagnosis not present

## 2021-01-18 DIAGNOSIS — E782 Mixed hyperlipidemia: Secondary | ICD-10-CM | POA: Diagnosis not present

## 2021-01-24 DIAGNOSIS — I119 Hypertensive heart disease without heart failure: Secondary | ICD-10-CM | POA: Diagnosis not present

## 2021-02-05 ENCOUNTER — Other Ambulatory Visit: Payer: Self-pay | Admitting: Cardiology

## 2021-02-14 DIAGNOSIS — G214 Vascular parkinsonism: Secondary | ICD-10-CM | POA: Diagnosis not present

## 2021-02-14 DIAGNOSIS — F419 Anxiety disorder, unspecified: Secondary | ICD-10-CM | POA: Diagnosis not present

## 2021-02-14 DIAGNOSIS — R5381 Other malaise: Secondary | ICD-10-CM | POA: Diagnosis not present

## 2021-03-09 DIAGNOSIS — Z20822 Contact with and (suspected) exposure to covid-19: Secondary | ICD-10-CM | POA: Diagnosis not present

## 2021-03-14 DIAGNOSIS — R5381 Other malaise: Secondary | ICD-10-CM | POA: Diagnosis not present

## 2021-03-14 DIAGNOSIS — F419 Anxiety disorder, unspecified: Secondary | ICD-10-CM | POA: Diagnosis not present

## 2021-03-14 DIAGNOSIS — G214 Vascular parkinsonism: Secondary | ICD-10-CM | POA: Diagnosis not present

## 2021-03-20 DIAGNOSIS — Z20822 Contact with and (suspected) exposure to covid-19: Secondary | ICD-10-CM | POA: Diagnosis not present

## 2021-04-12 DIAGNOSIS — Z20822 Contact with and (suspected) exposure to covid-19: Secondary | ICD-10-CM | POA: Diagnosis not present

## 2021-04-19 DIAGNOSIS — Z20822 Contact with and (suspected) exposure to covid-19: Secondary | ICD-10-CM | POA: Diagnosis not present

## 2021-04-20 DIAGNOSIS — Z20822 Contact with and (suspected) exposure to covid-19: Secondary | ICD-10-CM | POA: Diagnosis not present

## 2021-04-24 DIAGNOSIS — Z79891 Long term (current) use of opiate analgesic: Secondary | ICD-10-CM | POA: Diagnosis not present

## 2021-04-24 DIAGNOSIS — M5136 Other intervertebral disc degeneration, lumbar region: Secondary | ICD-10-CM | POA: Diagnosis not present

## 2021-05-02 ENCOUNTER — Ambulatory Visit: Payer: PRIVATE HEALTH INSURANCE | Admitting: Cardiology

## 2021-05-10 DIAGNOSIS — Z20822 Contact with and (suspected) exposure to covid-19: Secondary | ICD-10-CM | POA: Diagnosis not present

## 2021-05-14 DIAGNOSIS — Z20822 Contact with and (suspected) exposure to covid-19: Secondary | ICD-10-CM | POA: Diagnosis not present

## 2021-05-15 DIAGNOSIS — Z20822 Contact with and (suspected) exposure to covid-19: Secondary | ICD-10-CM | POA: Diagnosis not present

## 2021-05-18 DIAGNOSIS — Z20822 Contact with and (suspected) exposure to covid-19: Secondary | ICD-10-CM | POA: Diagnosis not present

## 2021-05-21 ENCOUNTER — Emergency Department (HOSPITAL_COMMUNITY): Payer: Medicare Other

## 2021-05-21 ENCOUNTER — Inpatient Hospital Stay (HOSPITAL_COMMUNITY)
Admission: EM | Admit: 2021-05-21 | Discharge: 2021-05-25 | DRG: 683 | Disposition: A | Discharge status: Rehabilitation Facility | Payer: Medicare Other | Attending: Internal Medicine | Admitting: Internal Medicine

## 2021-05-21 DIAGNOSIS — I700 Atherosclerosis of aorta: Secondary | ICD-10-CM | POA: Diagnosis not present

## 2021-05-21 DIAGNOSIS — D72829 Elevated white blood cell count, unspecified: Secondary | ICD-10-CM

## 2021-05-21 DIAGNOSIS — N529 Male erectile dysfunction, unspecified: Secondary | ICD-10-CM | POA: Diagnosis present

## 2021-05-21 DIAGNOSIS — I119 Hypertensive heart disease without heart failure: Secondary | ICD-10-CM | POA: Diagnosis present

## 2021-05-21 DIAGNOSIS — G20 Parkinson's disease: Secondary | ICD-10-CM

## 2021-05-21 DIAGNOSIS — E876 Hypokalemia: Secondary | ICD-10-CM | POA: Diagnosis present

## 2021-05-21 DIAGNOSIS — N401 Enlarged prostate with lower urinary tract symptoms: Secondary | ICD-10-CM | POA: Diagnosis present

## 2021-05-21 DIAGNOSIS — N179 Acute kidney failure, unspecified: Secondary | ICD-10-CM | POA: Diagnosis not present

## 2021-05-21 DIAGNOSIS — J90 Pleural effusion, not elsewhere classified: Secondary | ICD-10-CM | POA: Diagnosis not present

## 2021-05-21 DIAGNOSIS — K5901 Slow transit constipation: Secondary | ICD-10-CM | POA: Diagnosis not present

## 2021-05-21 DIAGNOSIS — Z7901 Long term (current) use of anticoagulants: Secondary | ICD-10-CM | POA: Diagnosis not present

## 2021-05-21 DIAGNOSIS — I4891 Unspecified atrial fibrillation: Secondary | ICD-10-CM | POA: Diagnosis not present

## 2021-05-21 DIAGNOSIS — I10 Essential (primary) hypertension: Secondary | ICD-10-CM | POA: Diagnosis present

## 2021-05-21 DIAGNOSIS — I4819 Other persistent atrial fibrillation: Secondary | ICD-10-CM | POA: Diagnosis present

## 2021-05-21 DIAGNOSIS — R338 Other retention of urine: Secondary | ICD-10-CM | POA: Diagnosis present

## 2021-05-21 DIAGNOSIS — G20A1 Parkinson's disease without dyskinesia, without mention of fluctuations: Secondary | ICD-10-CM

## 2021-05-21 DIAGNOSIS — K5900 Constipation, unspecified: Secondary | ICD-10-CM | POA: Diagnosis not present

## 2021-05-21 DIAGNOSIS — I2510 Atherosclerotic heart disease of native coronary artery without angina pectoris: Secondary | ICD-10-CM | POA: Diagnosis present

## 2021-05-21 DIAGNOSIS — H35712 Central serous chorioretinopathy, left eye: Secondary | ICD-10-CM | POA: Diagnosis present

## 2021-05-21 DIAGNOSIS — K5909 Other constipation: Secondary | ICD-10-CM | POA: Diagnosis present

## 2021-05-21 DIAGNOSIS — M47816 Spondylosis without myelopathy or radiculopathy, lumbar region: Secondary | ICD-10-CM | POA: Diagnosis not present

## 2021-05-21 DIAGNOSIS — Z955 Presence of coronary angioplasty implant and graft: Secondary | ICD-10-CM

## 2021-05-21 DIAGNOSIS — N1330 Unspecified hydronephrosis: Secondary | ICD-10-CM | POA: Diagnosis present

## 2021-05-21 DIAGNOSIS — Z79899 Other long term (current) drug therapy: Secondary | ICD-10-CM | POA: Diagnosis not present

## 2021-05-21 DIAGNOSIS — I4821 Permanent atrial fibrillation: Secondary | ICD-10-CM | POA: Diagnosis present

## 2021-05-21 DIAGNOSIS — R5381 Other malaise: Secondary | ICD-10-CM | POA: Diagnosis not present

## 2021-05-21 DIAGNOSIS — Z741 Need for assistance with personal care: Secondary | ICD-10-CM | POA: Diagnosis present

## 2021-05-21 DIAGNOSIS — Z87442 Personal history of urinary calculi: Secondary | ICD-10-CM | POA: Diagnosis not present

## 2021-05-21 DIAGNOSIS — G4733 Obstructive sleep apnea (adult) (pediatric): Secondary | ICD-10-CM | POA: Diagnosis present

## 2021-05-21 DIAGNOSIS — K219 Gastro-esophageal reflux disease without esophagitis: Secondary | ICD-10-CM | POA: Diagnosis present

## 2021-05-21 DIAGNOSIS — N400 Enlarged prostate without lower urinary tract symptoms: Secondary | ICD-10-CM | POA: Diagnosis present

## 2021-05-21 DIAGNOSIS — N320 Bladder-neck obstruction: Secondary | ICD-10-CM | POA: Diagnosis present

## 2021-05-21 DIAGNOSIS — N3289 Other specified disorders of bladder: Secondary | ICD-10-CM | POA: Diagnosis not present

## 2021-05-21 DIAGNOSIS — M549 Dorsalgia, unspecified: Secondary | ICD-10-CM | POA: Diagnosis present

## 2021-05-21 DIAGNOSIS — K8020 Calculus of gallbladder without cholecystitis without obstruction: Secondary | ICD-10-CM | POA: Diagnosis not present

## 2021-05-21 DIAGNOSIS — N2889 Other specified disorders of kidney and ureter: Secondary | ICD-10-CM | POA: Diagnosis not present

## 2021-05-21 DIAGNOSIS — N138 Other obstructive and reflux uropathy: Secondary | ICD-10-CM | POA: Diagnosis present

## 2021-05-21 DIAGNOSIS — E785 Hyperlipidemia, unspecified: Secondary | ICD-10-CM | POA: Diagnosis present

## 2021-05-21 DIAGNOSIS — Q8909 Congenital malformations of spleen: Secondary | ICD-10-CM | POA: Diagnosis not present

## 2021-05-21 DIAGNOSIS — N281 Cyst of kidney, acquired: Secondary | ICD-10-CM | POA: Diagnosis not present

## 2021-05-21 DIAGNOSIS — F419 Anxiety disorder, unspecified: Secondary | ICD-10-CM | POA: Diagnosis present

## 2021-05-21 DIAGNOSIS — Z882 Allergy status to sulfonamides status: Secondary | ICD-10-CM

## 2021-05-21 DIAGNOSIS — R42 Dizziness and giddiness: Secondary | ICD-10-CM | POA: Diagnosis not present

## 2021-05-21 DIAGNOSIS — Z6838 Body mass index (BMI) 38.0-38.9, adult: Secondary | ICD-10-CM | POA: Diagnosis not present

## 2021-05-21 DIAGNOSIS — E669 Obesity, unspecified: Secondary | ICD-10-CM | POA: Diagnosis not present

## 2021-05-21 DIAGNOSIS — R001 Bradycardia, unspecified: Secondary | ICD-10-CM | POA: Diagnosis not present

## 2021-05-21 DIAGNOSIS — R000 Tachycardia, unspecified: Secondary | ICD-10-CM | POA: Diagnosis not present

## 2021-05-21 DIAGNOSIS — R339 Retention of urine, unspecified: Secondary | ICD-10-CM | POA: Diagnosis not present

## 2021-05-21 DIAGNOSIS — Z66 Do not resuscitate: Secondary | ICD-10-CM | POA: Diagnosis present

## 2021-05-21 LAB — COMPREHENSIVE METABOLIC PANEL
ALT: <5 U/L (ref 0–44)
AST: 15 U/L (ref 15–41)
Albumin: 3.5 g/dL (ref 3.5–5.0)
Alkaline Phosphatase: 70 U/L (ref 38–126)
Anion gap: 9 (ref 5–15)
BUN: 57 mg/dL — ABNORMAL HIGH (ref 8–23)
CO2: 25 mmol/L (ref 22–32)
Calcium: 8.7 mg/dL — ABNORMAL LOW (ref 8.9–10.3)
Chloride: 106 mmol/L (ref 98–111)
Creatinine, Ser: 4.7 mg/dL — ABNORMAL HIGH (ref 0.61–1.24)
GFR, Estimated: 13 mL/min — ABNORMAL LOW (ref >60)
Glucose, Bld: 129 mg/dL — ABNORMAL HIGH (ref 70–99)
Potassium: 3.7 mmol/L (ref 3.5–5.1)
Sodium: 140 mmol/L (ref 135–145)
Total Bilirubin: 1.5 mg/dL — ABNORMAL HIGH (ref 0.3–1.2)
Total Protein: 6.2 g/dL — ABNORMAL LOW (ref 6.5–8.1)

## 2021-05-21 LAB — CBC WITH DIFFERENTIAL/PLATELET
Abs Immature Granulocytes: 0.08 10*3/uL — ABNORMAL HIGH (ref 0.00–0.07)
Basophils Absolute: 0 10*3/uL (ref 0.0–0.1)
Basophils Relative: 0 %
Eosinophils Absolute: 0.1 10*3/uL (ref 0.0–0.5)
Eosinophils Relative: 0 %
HCT: 45.4 % (ref 39.0–52.0)
Hemoglobin: 15.3 g/dL (ref 13.0–17.0)
Immature Granulocytes: 1 %
Lymphocytes Relative: 6 %
Lymphs Abs: 0.7 10*3/uL (ref 0.7–4.0)
MCH: 33.1 pg (ref 26.0–34.0)
MCHC: 33.7 g/dL (ref 30.0–36.0)
MCV: 98.3 fL (ref 80.0–100.0)
Monocytes Absolute: 0.9 10*3/uL (ref 0.1–1.0)
Monocytes Relative: 7 %
Neutro Abs: 10.5 10*3/uL — ABNORMAL HIGH (ref 1.7–7.7)
Neutrophils Relative %: 86 %
Platelets: 135 10*3/uL — ABNORMAL LOW (ref 150–400)
RBC: 4.62 MIL/uL (ref 4.22–5.81)
RDW: 13.4 % (ref 11.5–15.5)
WBC: 12.3 10*3/uL — ABNORMAL HIGH (ref 4.0–10.5)
nRBC: 0 % (ref 0.0–0.2)

## 2021-05-21 LAB — URINALYSIS, ROUTINE W REFLEX MICROSCOPIC
Bilirubin Urine: NEGATIVE
Glucose, UA: NEGATIVE mg/dL
Ketones, ur: NEGATIVE mg/dL
Leukocytes,Ua: NEGATIVE
Nitrite: NEGATIVE
Protein, ur: 30 mg/dL — AB
RBC / HPF: >50 RBC/hpf — ABNORMAL HIGH (ref 0–5)
Specific Gravity, Urine: 1.009 (ref 1.005–1.030)
pH: 5 (ref 5.0–8.0)

## 2021-05-21 LAB — BRAIN NATRIURETIC PEPTIDE: B Natriuretic Peptide: 98 pg/mL (ref 0.0–100.0)

## 2021-05-21 LAB — CREATININE, URINE, RANDOM: Creatinine, Urine: 61.02 mg/dL

## 2021-05-21 LAB — SODIUM, URINE, RANDOM: Sodium, Ur: 39 mmol/L

## 2021-05-21 MED ORDER — RIVAROXABAN 15 MG PO TABS
15.0000 mg | ORAL_TABLET | Freq: Every day | ORAL | Status: DC
Start: 2021-05-21 — End: 2021-05-23
  Administered 2021-05-21 – 2021-05-22 (×2): 15 mg via ORAL
  Filled 2021-05-21 (×3): qty 1

## 2021-05-21 MED ORDER — ACETAMINOPHEN 650 MG RE SUPP: 650.0000 mg | Freq: Four times a day (QID) | RECTAL | Status: DC | PRN

## 2021-05-21 MED ORDER — TRAMADOL HCL 50 MG PO TABS
50.0000 mg | ORAL_TABLET | Freq: Three times a day (TID) | ORAL | Status: DC | PRN
  Administered 2021-05-21 – 2021-05-25 (×2): 50 mg via ORAL
  Filled 2021-05-21 (×2): qty 1

## 2021-05-21 MED ORDER — CLONAZEPAM 0.25 MG PO TBDP
0.2500 mg | ORAL_TABLET | Freq: Two times a day (BID) | ORAL | Status: DC | PRN
Start: 2021-05-21 — End: 2021-05-25
  Administered 2021-05-22: 0.25 mg via ORAL
  Filled 2021-05-21: qty 2

## 2021-05-21 MED ORDER — SODIUM CHLORIDE 0.9 % IV SOLN: INTRAVENOUS | Status: AC

## 2021-05-21 MED ORDER — METOPROLOL TARTRATE 50 MG PO TABS
50.0000 mg | ORAL_TABLET | Freq: Two times a day (BID) | ORAL | Status: DC
  Administered 2021-05-21 – 2021-05-25 (×8): 50 mg via ORAL
  Filled 2021-05-21: qty 2
  Filled 2021-05-21 (×3): qty 1
  Filled 2021-05-21: qty 2
  Filled 2021-05-21 (×3): qty 1

## 2021-05-21 MED ORDER — PANTOPRAZOLE SODIUM 40 MG PO TBEC
40.0000 mg | DELAYED_RELEASE_TABLET | Freq: Every day | ORAL | Status: DC
Start: 2021-05-21 — End: 2021-05-25
  Administered 2021-05-21 – 2021-05-25 (×5): 40 mg via ORAL
  Filled 2021-05-21 (×5): qty 1

## 2021-05-21 MED ORDER — TADALAFIL 5 MG PO TABS
5.0000 mg | ORAL_TABLET | Freq: Every evening | ORAL | Status: DC
Start: 2021-05-21 — End: 2021-05-25
  Administered 2021-05-21 – 2021-05-24 (×4): 5 mg via ORAL
  Filled 2021-05-21 (×7): qty 1

## 2021-05-21 MED ORDER — ATORVASTATIN CALCIUM 10 MG PO TABS
5.0000 mg | ORAL_TABLET | ORAL | Status: DC
  Administered 2021-05-21 – 2021-05-25 (×3): 5 mg via ORAL
  Filled 2021-05-21 (×4): qty 1

## 2021-05-21 MED ORDER — CARBIDOPA-LEVODOPA 25-100 MG PO TABS
3.0000 | ORAL_TABLET | Freq: Three times a day (TID) | ORAL | Status: DC
  Administered 2021-05-21 – 2021-05-25 (×11): 3 via ORAL
  Filled 2021-05-21 (×13): qty 3

## 2021-05-21 MED ORDER — CLONAZEPAM 0.5 MG PO TABS: 0.2500 mg | ORAL_TABLET | Freq: Two times a day (BID) | ORAL | Status: DC | PRN

## 2021-05-21 MED ORDER — ENTACAPONE 200 MG PO TABS
200.0000 mg | ORAL_TABLET | Freq: Three times a day (TID) | ORAL | Status: DC
Start: 2021-05-21 — End: 2021-05-25
  Administered 2021-05-22 – 2021-05-25 (×10): 200 mg via ORAL
  Filled 2021-05-21 (×15): qty 1

## 2021-05-21 MED ORDER — ONDANSETRON HCL 4 MG PO TABS: 4.0000 mg | ORAL_TABLET | Freq: Four times a day (QID) | ORAL | Status: DC | PRN

## 2021-05-21 MED ORDER — POLYETHYLENE GLYCOL 3350 17 G PO PACK
17.0000 g | PACK | Freq: Every day | ORAL | Status: DC
  Administered 2021-05-21 – 2021-05-24 (×4): 17 g via ORAL
  Filled 2021-05-21 (×4): qty 1

## 2021-05-21 MED ORDER — ONDANSETRON HCL 4 MG/2ML IJ SOLN: 4.0000 mg | Freq: Four times a day (QID) | INTRAMUSCULAR | Status: DC | PRN

## 2021-05-21 MED ORDER — ACETAMINOPHEN 325 MG PO TABS
650.0000 mg | ORAL_TABLET | Freq: Four times a day (QID) | ORAL | Status: DC | PRN
  Administered 2021-05-22 – 2021-05-24 (×3): 650 mg via ORAL
  Filled 2021-05-21 (×3): qty 2

## 2021-05-21 MED ORDER — AMLODIPINE BESYLATE 5 MG PO TABS
10.0000 mg | ORAL_TABLET | Freq: Every day | ORAL | Status: DC
  Administered 2021-05-22: 10 mg via ORAL
  Filled 2021-05-21: qty 2

## 2021-05-21 MED ORDER — AMANTADINE HCL 100 MG PO CAPS
100.0000 mg | ORAL_CAPSULE | Freq: Every day | ORAL | Status: DC
  Administered 2021-05-22 – 2021-05-25 (×4): 100 mg via ORAL
  Filled 2021-05-21 (×4): qty 1

## 2021-05-21 MED ORDER — AMANTADINE HCL 100 MG PO CAPS: 100.0000 mg | ORAL_CAPSULE | ORAL | Status: DC

## 2021-05-21 MED ORDER — DOCUSATE SODIUM 100 MG PO CAPS
100.0000 mg | ORAL_CAPSULE | Freq: Two times a day (BID) | ORAL | Status: DC | PRN
  Administered 2021-05-22: 100 mg via ORAL
  Filled 2021-05-21: qty 1

## 2021-05-21 MED ORDER — SODIUM CHLORIDE 0.9 % IV SOLN: INTRAVENOUS | Status: DC

## 2021-05-21 MED ORDER — EZETIMIBE 10 MG PO TABS
10.0000 mg | ORAL_TABLET | Freq: Every evening | ORAL | Status: DC
  Administered 2021-05-21 – 2021-05-24 (×4): 10 mg via ORAL
  Filled 2021-05-21 (×4): qty 1

## 2021-05-21 MED ORDER — ATORVASTATIN CALCIUM 10 MG PO TABS: 5.0000 mg | ORAL_TABLET | ORAL | Status: DC

## 2021-05-21 MED ORDER — ATORVASTATIN CALCIUM 10 MG PO TABS
10.0000 mg | ORAL_TABLET | ORAL | Status: DC
  Administered 2021-05-22 – 2021-05-24 (×2): 10 mg via ORAL
  Filled 2021-05-21 (×3): qty 1

## 2021-05-21 NOTE — ED Triage Notes (Addendum)
EMS stated, pt. Is dizzy  with weakness, started 3-4 days ago. Pt is only able to stand with assistance.  NO chest pain  ?20g left hand ?400cc of NS ?'10mg'$  Cardizem ?

## 2021-05-21 NOTE — H&P (Addendum)
?History and Physical  ? ? ?Patient: Isaiah Pena QQP:[id number] DOB: 1950/10/03 ?DOA: 05/21/2021 ?DOS: the patient was seen and examined on 05/21/2021 ?PCP: Mayra Neer, MD  ?Patient coming from: Home - lives with his significant other  ? ? ?Chief Complaint: worsening shaking, urinary retention/constipation  ? ?HPI: Isaiah Pena is a 71 y.o. male with medical history significant of atrial fibrillation on  xarelto, CAD s/p PCI to RCA, HTN, HLD, OSA on bipap, BPH, PD who presented to ED with complaints of dizziness and weakness x 3-4 days. He also has had urinary retention and constipation. He has not urinated in a week. Denies any dysuria.  His last BM was 3-4 days ago. His wife also describes "radical shaking" that started 3-4 days ago, worse with movement. She states he would start walking and then he would lock up and start shaking. He will get short of breath with these shaking episodes. He also has had some intermittent confusion, worse in the last 3 days. Not confused during time of admit.  ? ? Denies any fever/chills, vision changes/headaches, chest pain or palpitations, cough, abdominal pain, N/V/D, dysuria or leg swelling.  ? ?He does not smoke or drink alcohol  ? ?ER Course:  vitals: temp: 99.4, bp: 136/85, HR: 100, RR: 16, oxygen: 98%RA ?Pertinent labs: wbc: 12.3, BUN: 57, creatinine: 4.70,  ?CXR: cardiomegaly, small left pleural effusion ?Abdomen xray: no acute finding ?CT abdomen: Nonspecific bladder distension and perivesical soft tissue ?stranding without apparent bladder wall thickening or focal bladder ?lesion. Findings may be secondary to bladder outlet obstruction from ?prostate gland enlargement ?Mild bilateral hydronephrosis without evidence of obstructing ureteral calculus.  ?Complex peripherally calcified mass in lower pole of right kidney, incompletely characterized without contrast. Urothelial malignancy can not be excluded.  ?In ED: foley placed.  ? ? ?Review of Systems: As mentioned in  the history of present illness. All other systems reviewed and are negative. ?Past Medical History:  ?Diagnosis Date  ? Anticoagulant long-term use   ? xarelto  ? Bilateral lower extremity edema   ? BPH (benign prostatic hyperplasia)   ? BPH (benign prostatic hyperplasia)   ? Central serous retinopathy   ? LOSS OF CENTER VISION LEFT EYE W/ BLURRED VISION-  was treated  methotrexate by specialist at [facility] and released  ? Coronary artery disease cardiologist-  dr Tressia Miners turner  ? 01/ 2004  abnormal cardiolite  s/p  cardiac cath w/ PCI and DES to dRCA  ? Edema of extremities   ? CHRONIC   ? Erectile dysfunction   ? GERD (gastroesophageal reflux disease)   ? History of colon polyps   ? 2011 per pt benign  ? History of kidney stones   ? History of simple renal cyst   ? drained via radiology  ? History of torn meniscus of right knee   ? Hyperlipidemia   ? Hypertension   ? Kidney stone   ? HX OF STONE-PASSED   ? Lower urinary tract symptoms (LUTS)   ? OSA (obstructive sleep apnea) 09/29/2011  ? NPSG 2004:  AHI 25/hr  Rx with bilevel.     ? OSA treated with BiPAP   ? moderate per study 01-23-2002  ? Parkinson's disease ([place])   ? neurologist-  dr ed hill ([facility])  ? Permanent atrial fibrillation ([place])   ? Persistent atrial fibrillation ([place]) 11/08/2012  ? S/P drug eluting coronary stent placement   ? 01/ 2004  x1 to dRCA  ? ?Past Surgical History:  ?  Procedure Laterality Date  ? CARDIOVASCULAR STRESS TEST  05-07-2015   dr Tressia Miners turner  ? normal nuclear study w/ no ischemia/  normal LV function and wall motion , stress ef 55%  ? COLONOSCOPY WITH PROPOFOL  2011  ? CORONARY ANGIOPLASTY WITH STENT PLACEMENT  02-11-2002  dr Daneen Schick  ? abnormal cardiolite--  PCI and DES to dRCA (99%),  pRCA luminal irregularities, mild to moderate LAD and CFX disease,  normal LVF  ? CYSTOSCOPY WITH INSERTION OF UROLIFT N/A 01/28/2016  ? Procedure: CYSTOSCOPY WITH INSERTION OF UROLIFT;  Surgeon: Franchot Gallo, MD;  Location: [facility];  Service: Urology;  Laterality: N/A;  ? HERNIA REPAIR    ? AGE 78  ? KNEE ARTHROSCOPY  03/21/2011  ? Procedure: ARTHROSCOPY KNEE;  Surgeon: Tobi Bastos, MD;  Location: [facility];  Service: Orthopedics;  Laterality: Right;  ? ?Social History:  reports that he has never smoked. He has never used smokeless tobacco. He reports that he does not drink alcohol and does not use drugs. ? ?Allergies  ?Allergen Reactions  ? Sulfa Antibiotics Hives  ? ? ?No family history on file. ? ?Prior to Admission medications   ?Medication Sig Start Date End Date Taking? Authorizing Provider  ?acetaminophen (TYLENOL) 650 MG CR tablet Take 1,300 mg by mouth every 8 (eight) hours as needed for pain.   Yes [provider]  ?amantadine (SYMMETREL) 100 MG capsule Take 100 mg by mouth See admin instructions. Take one capsule (100 mg) by mouth daily at bedtime - 1am 03/14/21  Yes [provider]  ?amLODipine-benazepril (LOTREL) 10-40 MG per capsule TAKE CAPSULE CAPSULE BY MOUTH ONCE DAILY ?Patient taking differently: Take 1 capsule by mouth every morning. 01/16/14  Yes Turner, Eber Hong, MD  ?atorvastatin (LIPITOR) 10 MG tablet TAKE 1/2 TABLET BY MOUTH EVERY OTHER DAY AND ALTERNATING 1 TABLET BY MOUTH EVERY OTHER DAY. Please keep upcoming appt in April 2023 before anymore refills. Thank you ?Patient taking differently: Take 5-10 mg by mouth See admin instructions. Take 1/2 tablet (5 mg) by mouth every other evening, alternate with 1 tablet (10 mg) every other evening. Please keep upcoming appt in April 2023 before anymore refills. Thank you 02/05/21  Yes Turner, Eber Hong, MD  ?carbidopa-levodopa (SINEMET IR) 25-100 MG tablet Take 3 tablets by mouth every 8 (eight) hours. 8am, 5pm and 1am 05/03/16  Yes [provider]  ?clonazePAM (KLONOPIN) 0.5 MG tablet Take 0.25 mg by mouth 2 (two) times daily as needed for anxiety. 05/01/21  Yes [provider]  ?Docusate Sodium (COLACE PO) Take 2 tablets by  mouth 2 (two) times daily as needed (constipation).   Yes [provider]  ?entacapone (COMTAN) 200 MG tablet Take 200 mg by mouth every 8 (eight) hours. 9am, 5pm, 1am 09/16/14  Yes [provider]  ?esomeprazole (NEXIUM) 20 MG capsule Take 20 mg by mouth every other day.   Yes [provider]  ?ezetimibe (ZETIA) 10 MG tablet Take 10 mg by mouth every evening.   Yes [provider]  ?metoprolol (LOPRESSOR) 50 MG tablet Take 50 mg by mouth 2 (two) times daily.   Yes [provider]  ?rivaroxaban (XARELTO) 20 MG TABS tablet TAKE 1 TABLET BY MOUTH EVERY EVENING WITH SUPPER ?Patient taking differently: 20 mg See admin instructions. Take one tablet (20 mg) by mouth daily at 1am 01/16/21  Yes Turner, Eber Hong, MD  ?tadalafil (CIALIS) 5 MG tablet Take 5 mg by mouth every  evening.   Yes [provider]  ?tolterodine (DETROL LA) 4 MG 24 hr capsule Take 4 mg by mouth every evening. 04/10/21  Yes [provider]  ?traMADol (ULTRAM) 50 MG tablet Take 50 mg by mouth 3 (three) times daily as needed (pain).   Yes [provider]  ? ? ?Physical Exam: ?Vitals:  ? 05/21/21 1330 05/21/21 1400 05/21/21 1500 05/21/21 1545  ?BP: 124/85 135/87 (!) 147/95 139/86  ?Pulse: (!) 102 99 (!) 104 (!) 104  ?Resp: (!) 22 (!) 23 (!) 23 17  ?Temp:      ?TempSrc:      ?SpO2: 100% 98% 99% 97%  ? ?General:  Appears calm and comfortable and is in NAD ?Eyes:  PERRL, EOMI, normal lids, iris ?ENT:  grossly normal hearing, lips & tongue, mmm; appropriate dentition ?Neck:  no LAD, masses or thyromegaly; no carotid bruits ?Cardiovascular:  irregularly irregular, no m/r/g. Trace LE edema   ?Respiratory:   CTA bilaterally with no wheezes/rales/rhonchi.  Normal respiratory effort. ?Abdomen:  soft, NT, ND, NABS ?Back:   normal alignment, no CVAT ?Skin:  no rash or induration seen on limited exam. Chronic venous stasis changes on LE  ?Musculoskeletal:  grossly decreased tone and strength BUE/BLE,  good ROM, no bony abnormality ?Lower extremity:   Limited foot exam with no ulcerations.  2+ distal pulses. ?Psychiatric:  grossly flat mood and affect, speech fluent and appropriate, AOx3 ?Neurologic:  CN 2

## 2021-05-21 NOTE — ED Provider Notes (Addendum)
, See ?[name] ?Provider Note ? ? ?CSN: [id number] ?Arrival date & time: 05/21/21  3295 ? ?  ? ?History ? ?Chief Complaint  ?Patient presents with  ? Atrial Fibrillation  ? Weakness  ? Dizziness  ? ? ?Isaiah Pena is a 71 y.o. male with history of Parkinson's, CAD on Xarelto, persistent A-fib, GERD, BPH who presents to the ED for evaluation of increased weakness, fatigue and constipation.  Wife provides most of the history initially due to patient's fatigue and states that she first noticed symptoms develop about 3 days ago.  Initially she noticed increased tremors of the bilateral hands, followed by the increased weakness and fatigue.  She has been giving him Colace and increasing electrolytes within his water and attempt to produce a bowel movement although this has been unsuccessful.  Appetite is depressed.  Patient denies abdominal pain but endorses nausea.  Denies chest pain, shortness of breath, fevers, vomiting, leg swelling. ? ? ?Atrial Fibrillation ? ?Weakness ?Associated symptoms: dizziness   ?Dizziness ?Associated symptoms: weakness   ? ?  ? ?Home Medications ?Prior to Admission medications   ?Medication Sig Start Date End Date Taking? Authorizing Provider  ?acetaminophen (TYLENOL) 650 MG CR tablet Take 1,300 mg by mouth every 8 (eight) hours as needed for pain.   Yes [provider]  ?amantadine (SYMMETREL) 100 MG capsule Take 100 mg by mouth See admin instructions. Take one capsule (100 mg) by mouth daily at bedtime - 1am 03/14/21  Yes [provider]  ?amLODipine-benazepril (LOTREL) 10-40 MG per capsule TAKE CAPSULE CAPSULE BY MOUTH ONCE DAILY ?Patient taking differently: Take 1 capsule by mouth every morning. 01/16/14  Yes Turner, Eber Hong, MD  ?atorvastatin (LIPITOR) 10 MG tablet TAKE 1/2 TABLET BY MOUTH EVERY OTHER DAY AND ALTERNATING 1 TABLET BY MOUTH EVERY OTHER DAY. Please keep upcoming appt in April 2023 before anymore refills. Thank  you ?Patient taking differently: Take 5-10 mg by mouth See admin instructions. Take 1/2 tablet (5 mg) by mouth every other evening, alternate with 1 tablet (10 mg) every other evening. Please keep upcoming appt in April 2023 before anymore refills. Thank you 02/05/21  Yes Turner, Eber Hong, MD  ?carbidopa-levodopa (SINEMET IR) 25-100 MG tablet Take 3 tablets by mouth every 8 (eight) hours. 8am, 5pm and 1am 05/03/16  Yes [provider]  ?clonazePAM (KLONOPIN) 0.5 MG tablet Take 0.25 mg by mouth 2 (two) times daily as needed for anxiety. 05/01/21  Yes [provider]  ?Docusate Sodium (COLACE PO) Take 2 tablets by mouth 2 (two) times daily as needed (constipation).   Yes [provider]  ?entacapone (COMTAN) 200 MG tablet Take 200 mg by mouth every 8 (eight) hours. 9am, 5pm, 1am 09/16/14  Yes [provider]  ?esomeprazole (NEXIUM) 20 MG capsule Take 20 mg by mouth every other day.   Yes [provider]  ?ezetimibe (ZETIA) 10 MG tablet Take 10 mg by mouth every evening.   Yes [provider]  ?metoprolol (LOPRESSOR) 50 MG tablet Take 50 mg by mouth 2 (two) times daily.   Yes [provider]  ?rivaroxaban (XARELTO) 20 MG TABS tablet TAKE 1 TABLET BY MOUTH EVERY EVENING WITH SUPPER ?Patient taking differently: 20 mg See admin instructions. Take one tablet (20 mg) by mouth daily at 1am 01/16/21  Yes Turner, Eber Hong, MD  ?tadalafil (CIALIS) 5 MG tablet Take 5 mg by mouth every evening.   Yes [provider]  ?tolterodine (DETROL  LA) 4 MG 24 hr capsule Take 4 mg by mouth every evening. 04/10/21  Yes [provider]  ?traMADol (ULTRAM) 50 MG tablet Take 50 mg by mouth 3 (three) times daily as needed (pain).   Yes [provider]  ?   ? ?Allergies    ?Sulfa antibiotics   ? ?Review of Systems   ?Review of Systems  ?Neurological:  Positive for dizziness and weakness.  ? ?Physical Exam ?Updated Vital Signs ?BP 139/86   Pulse (!) 104   Temp  98.9 ?F (37.2 ?C) (Oral)   Resp 17   SpO2 97%  ?Physical Exam ?Vitals and nursing note reviewed.  ?Constitutional:   ?   General: He is not in acute distress. ?   Appearance: He is ill-appearing.  ?HENT:  ?   Head: Atraumatic.  ?Eyes:  ?   Conjunctiva/sclera: Conjunctivae normal.  ?Cardiovascular:  ?   Rate and Rhythm: Normal rate and regular rhythm.  ?   Pulses: Normal pulses.     ?     Radial pulses are 2+ on the right side and 2+ on the left side.  ?     Dorsalis pedis pulses are 2+ on the right side and 2+ on the left side.  ?   Heart sounds: No murmur heard. ?   Comments: Vascular insufficiency skin changes noted to bilateral lower extremities ?Pulmonary:  ?   Effort: Pulmonary effort is normal. No respiratory distress.  ?   Breath sounds: Normal breath sounds.  ?Abdominal:  ?   General: Abdomen is flat. There is distension.  ?   Palpations: Abdomen is soft.  ?   Tenderness: There is abdominal tenderness.  ?   Comments: Abdomen is distended, palpable bladder up to the umbilicus.  Mildly tender across the lower abdomen  ?Musculoskeletal:     ?   General: Normal range of motion.  ?   Cervical back: Normal range of motion.  ?   Right lower leg: No edema.  ?   Left lower leg: Edema present.  ?Skin: ?   General: Skin is warm and dry.  ?   Capillary Refill: Capillary refill takes less than 2 seconds.  ?Neurological:  ?   General: No focal deficit present.  ?   Mental Status: He is alert.  ?Psychiatric:     ?   Mood and Affect: Mood normal.  ? ? ?ED Results / Procedures / Treatments   ?Labs ?(all labs ordered are listed, but only abnormal results are displayed) ?Labs Reviewed  ?CBC WITH DIFFERENTIAL/PLATELET - Abnormal; Notable for the following components:  ?    Result Value  ? WBC 12.3 (*)   ? Platelets 135 (*)   ? Neutro Abs 10.5 (*)   ? Abs Immature Granulocytes 0.08 (*)   ? All other components within normal limits  ?COMPREHENSIVE METABOLIC PANEL - Abnormal; Notable for the following components:  ? Glucose, Bld  129 (*)   ? BUN 57 (*)   ? Creatinine, Ser 4.70 (*)   ? Calcium 8.7 (*)   ? Total Protein 6.2 (*)   ? Total Bilirubin 1.5 (*)   ? GFR, Estimated 13 (*)   ? All other components within normal limits  ?BRAIN NATRIURETIC PEPTIDE  ?URINALYSIS, ROUTINE W REFLEX MICROSCOPIC  ?SODIUM, URINE, RANDOM  ?CREATININE, URINE, RANDOM  ? ? ?EKG ?EKG Interpretation ? ?Date/Time:  Tuesday May 21 2021 08:29:51 EDT ?Ventricular Rate:  105 ?PR Interval:    ?QRS Duration: 96 ?  QT Interval:  340 ?QTC Calculation: 449 ?R Axis:   -36 ?Text Interpretation: recurrent Atrial fibrillation with rapid ventricular response Left axis deviation Incomplete right bundle branch block Minimal voltage criteria for LVH, may be normal variant ( R in aVL ) Possible Anterior infarct , age undetermined When compared with ECG of 18-Mar-2011 15:43, PREVIOUS ECG IS PRESENT Confirmed by Blanchie Dessert [phone]) on 05/21/2021 11:09:14 AM ? ?Radiology ?CT ABDOMEN PELVIS WO CONTRAST ? ?Result Date: 05/21/2021 ?CLINICAL DATA:  Abdominal pain, acute, nonlocalized with new acute kidney injury and urinary retention. Dizziness with weakness for 3-4 days. EXAM: CT ABDOMEN AND PELVIS WITHOUT CONTRAST TECHNIQUE: Multidetector CT imaging of the abdomen and pelvis was performed following the standard protocol without IV contrast. RADIATION DOSE REDUCTION: This exam was performed according to the departmental dose-optimization program which includes automated exposure control, adjustment of the mA and/or kV according to patient size and/or use of iterative reconstruction technique. COMPARISON:  Abdominal radiographs 05/21/2021. Report only from remote abdominal CT 07/07/2000-unavailable for direct comparison. FINDINGS: Lower chest: Mild linear atelectasis or scarring at both lung bases. No significant pleural or pericardial effusion. Atherosclerosis of the aorta and coronary arteries. Hepatobiliary: No focal hepatic abnormalities are identified on noncontrast imaging. There are  small dependent calcified gallstones. No significant gallbladder distension, wall thickening or surrounding inflammation. No evidence of biliary dilatation. Pancreas: Unremarkable. No pancreatic ductal dilatation

## 2021-05-21 NOTE — Assessment & Plan Note (Addendum)
Continue medical management with lopressor, lipitor and xarelto  ?

## 2021-05-21 NOTE — Assessment & Plan Note (Addendum)
CT finding of complex peripherally calcified mass in lower pole of right kidney, incompletely characterized without contrast.  ?Followed by MRI with contrast of abdomen pelvis, consistent with cystic lesion ?

## 2021-05-21 NOTE — Assessment & Plan Note (Addendum)
Reasonably well-controlled.  Noted to be on just metoprolol for now.  Prior to admission he was on amlodipine-benazepril.  Resume amlodipine. ?

## 2021-05-21 NOTE — ED Notes (Signed)
Pt. Requested his port be access to draw blood ?

## 2021-05-21 NOTE — Assessment & Plan Note (Signed)
Continue PPI ?

## 2021-05-21 NOTE — Assessment & Plan Note (Addendum)
No signs of infection, likely reactive.  Now improved. ?

## 2021-05-21 NOTE — Assessment & Plan Note (Addendum)
Continue home comtan, sinemet IR and symmetrel ?PT consult -recommended CIR versus SNF ?Delirium precautions ?

## 2021-05-21 NOTE — Assessment & Plan Note (Signed)
Continue lipitor  ?

## 2021-05-21 NOTE — Assessment & Plan Note (Signed)
Does not wear cpap/bipap at night  ?

## 2021-05-21 NOTE — Assessment & Plan Note (Addendum)
-  AKI likely to obstructive pathology from acute urinary retention in setting of PD ?Foley catheter was placed in the ED.  Subsequently discontinued for voiding trial which he failed.  Foley catheter had to be reinserted. ?Case was discussed with the urologist who recommends discharging the patient with Foley catheter and follow-up with Dr. Diona Fanti in 2 weeks time. ?Patient noted to be on finasteride.  Patient is supposed to be on tolterodine as well.  Not listed in his current medication list. ?

## 2021-05-21 NOTE — Assessment & Plan Note (Addendum)
Had a bowel movement this morning.  Continue bowel regimen.   ?

## 2021-05-21 NOTE — Assessment & Plan Note (Addendum)
Continue xarelto. Continue lopressor BID.  Monitor heart rate. ?Replace potassium.  Magnesium was 1.5 a few days ago.  Will recheck. ?

## 2021-05-22 DIAGNOSIS — N179 Acute kidney failure, unspecified: Secondary | ICD-10-CM | POA: Diagnosis not present

## 2021-05-22 LAB — CBC
HCT: 40.8 % (ref 39.0–52.0)
Hemoglobin: 14.3 g/dL (ref 13.0–17.0)
MCH: 34.1 pg — ABNORMAL HIGH (ref 26.0–34.0)
MCHC: 35 g/dL (ref 30.0–36.0)
MCV: 97.4 fL (ref 80.0–100.0)
Platelets: 143 10*3/uL — ABNORMAL LOW (ref 150–400)
RBC: 4.19 MIL/uL — ABNORMAL LOW (ref 4.22–5.81)
RDW: 13.3 % (ref 11.5–15.5)
WBC: 8.5 10*3/uL (ref 4.0–10.5)
nRBC: 0 % (ref 0.0–0.2)

## 2021-05-22 LAB — BASIC METABOLIC PANEL
Anion gap: 7 (ref 5–15)
BUN: 47 mg/dL — ABNORMAL HIGH (ref 8–23)
CO2: 25 mmol/L (ref 22–32)
Calcium: 8.3 mg/dL — ABNORMAL LOW (ref 8.9–10.3)
Chloride: 111 mmol/L (ref 98–111)
Creatinine, Ser: 2.59 mg/dL — ABNORMAL HIGH (ref 0.61–1.24)
GFR, Estimated: 26 mL/min — ABNORMAL LOW (ref >60)
Glucose, Bld: 130 mg/dL — ABNORMAL HIGH (ref 70–99)
Potassium: 3.1 mmol/L — ABNORMAL LOW (ref 3.5–5.1)
Sodium: 143 mmol/L (ref 135–145)

## 2021-05-22 LAB — PHOSPHORUS: Phosphorus: 3.7 mg/dL (ref 2.5–4.6)

## 2021-05-22 LAB — HIV ANTIBODY (ROUTINE TESTING W REFLEX): HIV Screen 4th Generation wRfx: NONREACTIVE

## 2021-05-22 LAB — MAGNESIUM: Magnesium: 2 mg/dL (ref 1.7–2.4)

## 2021-05-22 LAB — TSH: TSH: 0.64 u[IU]/mL (ref 0.350–4.500)

## 2021-05-22 MED ORDER — SODIUM CHLORIDE 0.9 % IV SOLN: INTRAVENOUS | Status: DC

## 2021-05-22 MED ORDER — CHLORHEXIDINE GLUCONATE CLOTH 2 % EX PADS
6.0000 | MEDICATED_PAD | Freq: Every day | CUTANEOUS | Status: DC
  Administered 2021-05-22 – 2021-05-25 (×4): 6 via TOPICAL

## 2021-05-22 MED ORDER — SENNOSIDES-DOCUSATE SODIUM 8.6-50 MG PO TABS
1.0000 | ORAL_TABLET | Freq: Two times a day (BID) | ORAL | Status: DC
  Administered 2021-05-25 (×2): 1 via ORAL
  Filled 2021-05-22 (×5): qty 1

## 2021-05-22 MED ORDER — BISACODYL 10 MG RE SUPP: 10.0000 mg | Freq: Once | RECTAL | Status: DC

## 2021-05-22 NOTE — Progress Notes (Signed)
?PROGRESS NOTE ? ? ? ?Isaiah Pena  LPF:[id number] DOB: 1950/11/09 DOA: 05/21/2021 ?PCP: Mayra Neer, MD  ?71/M with history of Parkinson's disease, paroxysmal A-fib on  xarelto, CAD s/p PCI to RCA, HTN, HLD, OSA on bipap, BPH, PD who presented to ED with complaints of dizziness and weakness x 3-4 days. He also has had urinary retention and constipation.  Wife also reported shakes tremors and intermittent confusion in the last few days. ?-In the ED labs noted WBC of 12.3, creatinine of 4.7, CXR: cardiomegaly, small left pleural effusion, CT abdomen: Nonspecific bladder distension and perivesical soft tissue stranding . Findings may be secondary to bladder outlet obstruction from ?prostate gland enlargement, Mild bilateral hydronephrosis without evidence of obstructing ureteral calculus.  ?Complex peripherally calcified mass in lower pole of right kidney, incompletely characterized without contrast. Urothelial malignancy can not be excluded.  ?In ED: foley placed> 2 L drained ? ? ?Subjective: ?-Feels much better overall since his Foley was placed, no bowel movement yet ? ?Assessment and Plan: ? ?AKI with acute urinary retention  ?-BPH noted on imaging, suspect symptoms likely secondary to this, could have neurogenic bladder issues related to Parkinson's disease as well ?-Followed by Dr. Diona Fanti, has failed alpha blockers and other meds in the past, currently on Myrbetrix, tadalafil and tolterodine  ?-Now with Foley catheter, called and discussed with urology on-call Dr. McDiarmid, recommended Foley catheter at discharge and close follow-up with urology in 1 to 2 weeks for definitive management and assessment of right renal mass ?-Continue IV fluids 1 more day ?-Clinically do not suspect infection, follow-up urine cultures ?-Creatinine improving, BMP in a.m. ? ?Right renal mass ?CT finding of complex peripherally calcified mass in lower pole of right kidney, incompletely characterized without contrast. Malignancy  not excluded ?Renal US with complex cystic mass, calcifications in the right kidney ?-Follow-up with [facility] ? ?Constipation ?-Continue MiraLAX, add Senokot, Dulcolax suppository ? ?Persistent atrial fibrillation ([place]) ?Rate controlled ?Continue xarelto and Lopressor ? ?Parkinson disease ([place]) ?Has worsening tremors, likely secondary to AKI and renal retention of meds ?-overall improving with improvement in AKI ?Continue home comtan, sinemet IR and symmetrel ?PT consult  ?Delirium precautions ? ?Hypertension ?Well controlled.  ?Continue home lopressor '50mg'$  BID ?Will continue norvasc '10mg'$ , but hold ARB with AKI  ? ?Coronary artery disease s/p PCI to RCA  ?No chest pain ?Continue medical management with lopressor, lipitor and xarelto  ? ?Hyperlipidemia ?Continue lipitor  ? ?GERD (gastroesophageal reflux disease) ?Continue PPI  ? ?OSA (obstructive sleep apnea) ?Does not wear cpap/bipap at night  ? ?DVT prophylaxis: Xarelto ?Code Status: DNR ?Family Communication: Discussed with wife at bedside ?Disposition Plan: Home likely 48 hours ? ?Consultants:  ?None, called and discussed with urology on-call Dr. McDiarmid ? ?Procedures:  ? ?Antimicrobials:  ? ? ?Objective: ?Vitals:  ? 05/22/21 0715 05/22/21 0800 05/22/21 0845 05/22/21 0930  ?BP: (!) 143/100 (!) 145/107 (!) 118/93 132/86  ?Pulse: (!) 104 (!) 104 (!) 106 90  ?Resp: 19 18 (!) 24 15  ?Temp:      ?TempSrc:      ?SpO2: 95% 96% 97% 96%  ?Weight:  118.8 kg    ?Height:  '5\' 9"'$  (1.753 m)    ? ? ?Intake/Output Summary (Last 24 hours) at 05/22/2021 1028 ?Last data filed at 05/22/2021 0500 ?Gross per 24 hour  ?Intake 183.74 ml  ?Output 4100 ml  ?Net -3916.26 ml  ? ?Filed Weights  ? 05/22/21 0800  ?Weight: 118.8 kg  ? ? ?Examination: ? ?General  exam: Pleasant chronically ill male sitting up in bed, awake alert oriented x2, no distress ?CVS: S1-S2, regular rhythm ?Lungs: Poor air movement bilaterally ?Abdomen: Soft, obese, nontender, bowel sounds present ?Extremities: Trace  edema  ?Skin: Chronic venous stasis changes ?Psychiatry: Flat affect ? ? ? ?Data Reviewed:  ? ?CBC: ?Recent Labs  ?Lab 05/21/21 ?1203 05/22/21 ?0213  ?WBC 12.3* 8.5  ?NEUTROABS 10.5*  --   ?HGB 15.3 14.3  ?HCT 45.4 40.8  ?MCV 98.3 97.4  ?PLT 135* 143*  ? ?Basic Metabolic Panel: ?Recent Labs  ?Lab 05/21/21 ?1203 05/22/21 ?0150 05/22/21 ?0213  ?NA 140  --  143  ?K 3.7  --  3.1*  ?CL 106  --  111  ?CO2 25  --  25  ?GLUCOSE 129*  --  130*  ?BUN 57*  --  47*  ?CREATININE 4.70*  --  2.59*  ?CALCIUM 8.7*  --  8.3*  ?MG  --  2.0  --   ?PHOS  --  3.7  --   ? ?GFR: ?Estimated Creatinine Clearance: 33.7 mL/min (A) (by C-G formula based on SCr of 2.59 mg/dL (H)). ?Liver Function Tests: ?Recent Labs  ?Lab 05/21/21 ?1203  ?AST 15  ?ALT <5  ?ALKPHOS 70  ?BILITOT 1.5*  ?PROT 6.2*  ?ALBUMIN 3.5  ? ?No results for input(s): LIPASE, AMYLASE in the last 168 hours. ?No results for input(s): AMMONIA in the last 168 hours. ?Coagulation Profile: ?No results for input(s): INR, PROTIME in the last 168 hours. ?Cardiac Enzymes: ?No results for input(s): CKTOTAL, CKMB, CKMBINDEX, TROPONINI in the last 168 hours. ?BNP (last 3 results) ?No results for input(s): PROBNP in the last 8760 hours. ?HbA1C: ?No results for input(s): HGBA1C in the last 72 hours. ?CBG: ?No results for input(s): GLUCAP in the last 168 hours. ?Lipid Profile: ?No results for input(s): CHOL, HDL, LDLCALC, TRIG, CHOLHDL, LDLDIRECT in the last 72 hours. ?Thyroid Function Tests: ?Recent Labs  ?  05/22/21 ?0150  ?TSH 0.640  ? ?Anemia Panel: ?No results for input(s): VITAMINB12, FOLATE, FERRITIN, TIBC, IRON, RETICCTPCT in the last 72 hours. ?Urine analysis: ?   ?Component Value Date/Time  ? COLORURINE YELLOW 05/21/2021 1548  ? APPEARANCEUR HAZY (A) 05/21/2021 1548  ? LABSPEC 1.009 05/21/2021 1548  ? PHURINE 5.0 05/21/2021 1548  ? GLUCOSEU NEGATIVE 05/21/2021 1548  ? HGBUR LARGE (A) 05/21/2021 1548  ? [place] NEGATIVE 05/21/2021 1548  ? [place] NEGATIVE 05/21/2021 1548  ?  PROTEINUR 30 (A) 05/21/2021 1548  ? NITRITE NEGATIVE 05/21/2021 1548  ? LEUKOCYTESUR NEGATIVE 05/21/2021 1548  ? ?Sepsis Labs: ?'@LABRCNTIP'$ (procalcitonin:4,lacticidven:4) ? ?)No results found for this or any previous visit (from the past 240 hour(s)).  ? ?Radiology Studies: ?CT ABDOMEN PELVIS WO CONTRAST ? ?Result Date: 05/21/2021 ?CLINICAL DATA:  Abdominal pain, acute, nonlocalized with new acute kidney injury and urinary retention. Dizziness with weakness for 3-4 days. EXAM: CT ABDOMEN AND PELVIS WITHOUT CONTRAST TECHNIQUE: Multidetector CT imaging of the abdomen and pelvis was performed following the standard protocol without IV contrast. RADIATION DOSE REDUCTION: This exam was performed according to the departmental dose-optimization program which includes automated exposure control, adjustment of the mA and/or kV according to patient size and/or use of iterative reconstruction technique. COMPARISON:  Abdominal radiographs 05/21/2021. Report only from remote abdominal CT 07/07/2000-unavailable for direct comparison. FINDINGS: Lower chest: Mild linear atelectasis or scarring at both lung bases. No significant pleural or pericardial effusion. Atherosclerosis of the aorta and coronary arteries. Hepatobiliary: No focal hepatic abnormalities are identified on noncontrast imaging. There are  small dependent calcified gallstones. No significant gallbladder distension, wall thickening or surrounding inflammation. No evidence of biliary dilatation. Pancreas: Unremarkable. No pancreatic ductal dilatation or surrounding inflammatory changes. Spleen: Normal in size without focal abnormality. Adrenals/Urinary Tract: Both adrenal glands appear normal. Renal assessment limited by the lack of intravenous contrast. Both kidneys demonstrate mild ureteropelvocaliectasis and perinephric soft tissue stranding. No obstructing ureteral calculi are demonstrated. However, there is a peripherally calcified mass medially within the lower pole  of the right kidney, measuring 4.0 x 1.8 cm on image 49/3. Inferior to this mass, there is a noncalcified low-density lesion which is mildly heterogeneous, measuring 4.7 x 3.9 cm on image 52/3. This cou

## 2021-05-22 NOTE — Progress Notes (Signed)
Inpatient Rehab Admissions Coordinator:  ? ?Per therapy recommendation, patient was screened for CIR candidacy by Clemens Catholic, MS, CCC-SLP. At this time, Pt. Remains observation status and it is not clear that he demonstrates medical necessity for inpatient admission. [facility] team will follow and rescreen if pt. Becomes inpatient status.. ? ?Clemens Catholic, MS, CCC-SLP ?Rehab Admissions Coordinator  ?[phone] (celll) ?[phone] (office) ? ?

## 2021-05-22 NOTE — Evaluation (Signed)
Occupational Therapy Evaluation Patient Details Name: TORANCE PEREIDA MRN: [id number] DOB: 07/18/1950 Today's Date: 05/22/2021   History of Present Illness "Reita Cliche" is a 71 year old male presenting to [facility] ED 05/21/21 after 2-3 days of dizziness, weakness, and inability to void. Workup (+) for AKI and urinary retention. Imaging (+) for small L pleural effusion and R kidney cysts. PMH includse Parkinson's disease, L eye vision disruption, BPH, BLE edema, CAD s/p PCI to RCA, and HTN   Clinical Impression   PTA patient required some assist for ADLs from spouse, with assist for transfers from spouse.  Admitted for above and limited by problem list below, including generalized weakness, impaired balance, decreased activity tolerance and impaired cognition.  He is able to follow simple commands with increased time, demonstrates slow processing, initiation, sequencing with cueing required for awareness and safety. He completes bed mobility with max assist +2, sit to stand with mod assist +2 safety, and min-total assist for Adls.  Believe he will best benefit from AIR level rehab to optimize independence, safety in order to decreased burden of care for return back home with spouses assist.      Recommendations for follow up therapy are one component of a multi-disciplinary discharge planning process, led by the attending physician.  Recommendations may be updated based on patient status, additional functional criteria and insurance authorization.   Follow Up Recommendations  Acute inpatient rehab (3hours/day)    Assistance Recommended at Discharge Frequent or constant Supervision/Assistance  Patient can return home with the following Two people to help with walking and/or transfers;Two people to help with bathing/dressing/bathroom;Assistance with cooking/housework;Direct supervision/assist for medications management;Direct supervision/assist for financial management;Assist for transportation;Help with stairs or  ramp for entrance    Functional Status Assessment  Patient has had a recent decline in their functional status and demonstrates the ability to make significant improvements in function in a reasonable and predictable amount of time.  Equipment Recommendations  Other (comment) (defer)    Recommendations for Other Services Rehab consult     Precautions / Restrictions Precautions Precautions: Fall Restrictions Weight Bearing Restrictions: No      Mobility Bed Mobility Overal bed mobility: Needs Assistance Bed Mobility: Sit to Supine, Supine to Sit     Supine to sit: Max assist, +2 for physical assistance, +2 for safety/equipment Sit to supine: Max assist, +2 for physical assistance, +2 for safety/equipment   General bed mobility comments: max assist +2 for trunk and LB support, decreased sequencing and initation of tasks    Transfers                          Balance Overall balance assessment: Needs assistance Sitting-balance support: Feet supported, No upper extremity supported Sitting balance-Leahy Scale: Poor Sitting balance - Comments: preference to UE support, min to min guard statically Postural control: Posterior lean Standing balance support: Bilateral upper extremity supported, During functional activity Standing balance-Leahy Scale: Poor Standing balance comment: relies on RW and external support                           ADL either performed or assessed with clinical judgement   ADL Overall ADL's : Needs assistance/impaired     Grooming: Minimal assistance;Sitting           Upper Body Dressing : Maximal assistance;Sitting   Lower Body Dressing: +2 for physical assistance;Total assistance;+2 for safety/equipment;Sit to/from stand     Toilet  Transfer Details (indicate cue type and reason): sit to stand mod assist +2 safety, commode brought behind pt-- did not pivot or step to commode Toileting- Clothing Manipulation and Hygiene:  Total assistance;+2 for physical assistance;+2 for safety/equipment;Sit to/from stand       Functional mobility during ADLs: Moderate assistance;Maximal assistance;+2 for physical assistance;+2 for safety/equipment       Vision Patient Visual Report: No change from baseline Additional Comments: continue assessment     Perception     Praxis      Pertinent Vitals/Pain Pain Assessment Pain Assessment: No/denies pain     Hand Dominance     Extremity/Trunk Assessment Upper Extremity Assessment Upper Extremity Assessment: Generalized weakness   Lower Extremity Assessment Lower Extremity Assessment: Defer to PT evaluation   Cervical / Trunk Assessment Cervical / Trunk Assessment: Kyphotic   Communication Communication Communication: Expressive difficulties (slow to respond)   Cognition Arousal/Alertness: Awake/alert Behavior During Therapy: Flat affect Overall Cognitive Status: Impaired/Different from baseline Area of Impairment: Following commands, Awareness, Problem solving                       Following Commands: Follows one step commands consistently, Follows one step commands with increased time, Follows multi-step commands inconsistently   Awareness: Emergent Problem Solving: Slow processing, Decreased initiation, Difficulty sequencing, Requires verbal cues, Requires tactile cues General Comments: pt with parkinsons at baseline, spouse reports increased confusion recently.  Pt able to follow simple commands but requries increased time for problem sovling, initation and sequencing.     General Comments  spouse present and supportive    Exercises     Shoulder Instructions      Home Living Family/patient expects to be discharged to:: Private residence Living Arrangements: Spouse/significant other Available Help at Discharge: Family;Available 24 hours/day Type of Home: House Home Access: Stairs to enter [employer] of Steps: 3 Entrance  Stairs-Rails: Left Home Layout: One level     Bathroom Shower/Tub: Producer, television/film/video: Standard Bathroom Accessibility: Yes   Home Equipment: Rollator (4 wheels);Cane - single point;Shower seat   Additional Comments: very supportive family/community      Prior Functioning/Environment Prior Level of Function : Needs assist       Physical Assist : Mobility (physical);ADLs (physical) Mobility (physical): Bed mobility;Stairs;Transfers ADLs (physical): Bathing;Toileting;Dressing;IADLs Mobility Comments: wife, Lorene Dy, helps him with bed mobility and to stand up out of bed. Pt able to ambulate around house without assist, however, does require assist on stairs for safety. ADLs Comments: pt needs appox 50% assist for ADLs including dressing, bathing        OT Problem List: Decreased strength;Decreased activity tolerance;Impaired balance (sitting and/or standing);Decreased cognition;Decreased safety awareness;Decreased knowledge of use of DME or AE;Decreased coordination;Decreased knowledge of precautions      OT Treatment/Interventions: Self-care/ADL training;Therapeutic exercise;DME and/or AE instruction;Cognitive remediation/compensation;Therapeutic activities;Patient/family education;Balance training;Energy conservation    OT Goals(Current goals can be found in the care plan section) Acute Rehab OT Goals Patient Stated Goal: get better and get home OT Goal Formulation: With patient Time For Goal Achievement: 06/05/21 Potential to Achieve Goals: Good  OT Frequency: Min 2X/week    Co-evaluation              AM-PAC OT "6 Clicks" Daily Activity     Outcome Measure Help from another person eating meals?: A Lot Help from another person taking care of personal grooming?: A Little Help from another person toileting, which includes using toliet, bedpan, or urinal?: Total  Help from another person bathing (including washing, rinsing, drying)?: A Lot Help from  another person to put on and taking off regular upper body clothing?: A Lot Help from another person to put on and taking off regular lower body clothing?: Total 6 Click Score: 11   End of Session Equipment Utilized During Treatment: Gait belt;Rolling walker (2 wheels) Nurse Communication: Mobility status  Activity Tolerance: Patient tolerated treatment well Patient left: in bed;with call bell/phone within reach;with family/visitor present  OT Visit Diagnosis: Other abnormalities of gait and mobility (R26.89);Muscle weakness (generalized) (M62.81);Other symptoms and signs involving cognitive function                Time: 1325-1353 OT Time Calculation (min): 28 min Charges:  OT General Charges $OT Visit: 1 Visit OT Evaluation $OT Eval Moderate Complexity: 1 Mod OT Treatments $Self Care/Home Management : 8-22 mins  Barry Brunner, OT Acute Rehabilitation Services Pager [phone] Office [phone]   Chancy Milroy 05/22/2021, 2:06 PM

## 2021-05-22 NOTE — Progress Notes (Signed)
Patient arrived onto the unit alert and oriented x4 but somnolent. Patient able to answer questions correctly but falls back to sleep afterwards. MD paged and made aware. RN calls Rapid RN to round on patient.  ?

## 2021-05-22 NOTE — ED Notes (Signed)
PT at bedside.

## 2021-05-22 NOTE — Care Management Obs Status (Signed)
MEDICARE OBSERVATION STATUS NOTIFICATION ? ? ?Patient Details  ?Name: Isaiah Pena ?MRN: [id number] ?Date of Birth: 1950-09-21 ? ? ?Medicare Observation Status Notification Given:  Yes ? ? ? ?Carles Collet, RN ?05/22/2021, 4:21 PM ?

## 2021-05-22 NOTE — ED Notes (Signed)
Breakfast order placed ?

## 2021-05-22 NOTE — ED Notes (Signed)
Pt had a moderate amount of stool at this time. Wants to hold off on the laxative suppository and PO. MD messaged at this time. Will continue to monitor. ?

## 2021-05-22 NOTE — Plan of Care (Signed)
  Problem: Clinical Measurements: Goal: Diagnostic test results will improve Outcome: Progressing Goal: Respiratory complications will improve Outcome: Progressing   Problem: Coping: Goal: Level of anxiety will decrease Outcome: Progressing   Problem: Elimination: Goal: Will not experience complications related to urinary retention Outcome: Progressing   Problem: Safety: Goal: Ability to remain free from injury will improve Outcome: Progressing   

## 2021-05-22 NOTE — Evaluation (Addendum)
I agree with the following treatment note after review of the documentation. This session was performed under the supervision of a licensed clinician.   Cindee Salt, DPT  Acute Rehabilitation Services  Pager: [phone]   Physical Therapy Evaluation Patient Details Name: Isaiah Pena MRN: [id number] DOB: 1950-10-07 Today's Date: 05/22/2021  History of Present Illness  "Isaiah Pena" is a 71 year old male presenting to [facility] ED 05/21/21 after 2-3 days of dizziness, weakness, and inability to void. Workup (+) for AKI and urinary retention. Imaging (+) for small L pleural effusion and R kidney cysts. PMH includse Parkinson's disease, L eye vision disruption, BPH, BLE edema, CAD s/p PCI to RCA, and HTN   Clinical Impression  Patient presented to the ED on 5/2 with dizziness, weakness, and inability to  consistent with patient's diagnosis of AKI and urinary retention. Pt's impairments include decreased strength, range of motion, cognition, balance, and mobility. These impairments are limiting his ability to safely and independently transfer, ambulate, and navigate stairs. Patient requires max Ax2 with bed mobility and mod Ax2 for the initial transfer. Pt needed less assist (min A) with return to bed from [facility]. Pt has strong support system, including wife, who is available 24/7, and willing to provide the physical assist he needs if he progresses close to his baseline. Pt very self-motivated with good tolerance for therapy sessions. To decrease caregiver burden and increase pt's functional mobility closer to his baseline, recommending acute inpatient rehab. PT will continue to follow acutely to maximize pt's safety and independence.        Recommendations for follow up therapy are one component of a multi-disciplinary discharge planning process, led by the attending physician.  Recommendations may be updated based on patient status, additional functional criteria and insurance authorization.  Follow Up  Recommendations Acute inpatient rehab (3hours/day)    Assistance Recommended at Discharge Intermittent Supervision/Assistance  Patient can return home with the following  A lot of help with walking and/or transfers;A little help with bathing/dressing/bathroom;Assistance with cooking/housework;Direct supervision/assist for medications management;Direct supervision/assist for financial management;Assist for transportation;Help with stairs or ramp for entrance    Equipment Recommendations BSC/3in1;Rolling walker (2 wheels)  Recommendations for Other Services  Rehab consult    Functional Status Assessment Patient has had a recent decline in their functional status and demonstrates the ability to make significant improvements in function in a reasonable and predictable amount of time.     Precautions / Restrictions Precautions Precautions: Fall Restrictions Weight Bearing Restrictions: No      Mobility  Bed Mobility Overal bed mobility: Needs Assistance Bed Mobility: Supine to Sit, Sit to Supine     Supine to sit: +2 for physical assistance, +2 for safety/equipment, Max assist Sit to supine: +2 for physical assistance, +2 for safety/equipment, Max assist   General bed mobility comments: pt max Ax2 for trunk and LE management with sitting and for return to supine.    Transfers Overall transfer level: Needs assistance Equipment used: Rolling walker (2 wheels) Transfers: Sit to/from Stand, Bed to chair/wheelchair/BSC Sit to Stand: Mod assist, +2 physical assistance, +2 safety/equipment, From elevated surface   Step pivot transfers: Min assist, From elevated surface       General transfer comment: pt mod Ax2 for lift assist and steadying during sit to stand. Pt with initial motor planning and sequencing difficulties when transferring to [facility]. Pt spent ~10 minutes on BSC. When transferring back to bed, pt requiring min A for steadying during transfer and power up with  increased time.  Steps taken with step pivot performed with increased step length and sequencing.    Ambulation/Gait                  Stairs            Wheelchair Mobility    Modified Rankin (Stroke Patients Only)       Balance Overall balance assessment: Needs assistance Sitting-balance support: Single extremity supported, Feet unsupported Sitting balance-Leahy Scale: Poor Sitting balance - Comments: required single UE support in sitting with min A for trunk support; cues given to lean forward and decrease posterior trunk lean Postural control: Posterior lean Standing balance support: Bilateral upper extremity supported, Reliant on assistive device for balance, During functional activity Standing balance-Leahy Scale: Poor Standing balance comment: reliant on RW and external assist                             Pertinent Vitals/Pain Pain Assessment Pain Assessment: Faces Faces Pain Scale: No hurt    Home Living Family/patient expects to be discharged to:: Private residence Living Arrangements: Spouse/significant other Available Help at Discharge: Family;Available 24 hours/day Type of Home: House Home Access: Stairs to enter Entrance Stairs-Rails: Left Entrance Stairs-Number of Steps: 3   Home Layout: One level Home Equipment: Rollator (4 wheels);Cane - single point;Shower seat Additional Comments: very supportive family/community    Prior Function Prior Level of Function : Needs assist       Physical Assist : Mobility (physical);ADLs (physical) Mobility (physical): Bed mobility;Stairs;Transfers ADLs (physical): Bathing;Toileting;Dressing;IADLs Mobility Comments: wife, Lorene Dy, helps him with bed mobility and to stand up out of bed. Pt able to ambulate around house without assist, however, does require assist on stairs for safety. ADLs Comments: pt needs appox 50% assist for ADLs including dressing, bathing     Hand Dominance        Extremity/Trunk  Assessment   Upper Extremity Assessment Upper Extremity Assessment: Defer to OT evaluation    Lower Extremity Assessment Lower Extremity Assessment: Generalized weakness    Cervical / Trunk Assessment Cervical / Trunk Assessment: Kyphotic  Communication   Communication: Expressive difficulties (slow to respond)  Cognition Arousal/Alertness: Awake/alert Behavior During Therapy: Flat affect Overall Cognitive Status: Impaired/Different from baseline Area of Impairment: Following commands, Awareness, Problem solving                       Following Commands: Follows one step commands consistently, Follows one step commands with increased time, Follows multi-step commands inconsistently   Awareness: Emergent Problem Solving: Slow processing, Decreased initiation, Difficulty sequencing, Requires verbal cues, Requires tactile cues General Comments: pt with parkinsons at baseline, spouse reports increased confusion recently.  Pt able to follow simple commands but requries increased time for problem sovling, initation and sequencing.        General Comments General comments (skin integrity, edema, etc.): spouse present and supportive    Exercises     Assessment/Plan    PT Assessment Patient needs continued PT services  PT Problem List Decreased strength;Decreased range of motion;Decreased balance;Decreased mobility;Decreased cognition;Decreased coordination       PT Treatment Interventions Gait training;DME instruction;Stair training;Functional mobility training;Therapeutic activities;Therapeutic exercise;Balance training;Neuromuscular re-education;Patient/family education    PT Goals (Current goals can be found in the Pena Plan section)  Acute Rehab PT Goals Patient Stated Goal: to return home PT Goal Formulation: With patient/family Time For Goal Achievement: 06/05/21 Potential to Achieve Goals: Good  Frequency Min 3X/week     Co-evaluation                AM-PAC PT "6 Clicks" Mobility  Outcome Measure Help needed turning from your back to your side while in a flat bed without using bedrails?: A Little Help needed moving from lying on your back to sitting on the side of a flat bed without using bedrails?: Total Help needed moving to and from a bed to a chair (including a wheelchair)?: Total Help needed standing up from a chair using your arms (e.g., wheelchair or bedside chair)?: A Little Help needed to walk in hospital room?: A Lot Help needed climbing 3-5 steps with a railing? : A Lot 6 Click Score: 12    End of Session Equipment Utilized During Treatment: Gait belt Activity Tolerance: Patient tolerated treatment well Patient left: in bed;with call bell/phone within reach;with family/visitor present;Other (comment) (on stretcher in ED) Nurse Communication: Mobility status PT Visit Diagnosis: Unsteadiness on feet (R26.81);Other abnormalities of gait and mobility (R26.89);Muscle weakness (generalized) (M62.81);Difficulty in walking, not elsewhere classified (R26.2)    Time: 9147-8295 PT Time Calculation (min) (ACUTE ONLY): 47 min   Charges:   PT Evaluation $PT Eval Moderate Complexity: 1 Mod PT Treatments $Therapeutic Activity: 23-37 mins        Enis Slipper, SPT  [place] 05/22/2021, 2:29 PM

## 2021-05-22 NOTE — ED Notes (Signed)
2nd bm noted at this time. Small amount. Pt feels relief after 2 bms today.  ?

## 2021-05-22 NOTE — TOC Progression Note (Signed)
Transition of Care (TOC) - Progression Note  ? ? ?Patient Details  ?Name: Isaiah Pena ?MRN: [id number] ?Date of Birth: [birth year] ? ?Transition of Care (TOC) CM/SW Contact  ?Zenon Mayo, RN ?Phone Number: ?05/22/2021, 3:51 PM ? ?Clinical Narrative:    ?From home with significant other , presents with AKI, Persistent atrial fibrillation,Parkinson disease.  TOC will continue to follow for dc needs.  ? ? ?  ?  ? ?Expected Discharge Plan and Services ?  ?  ?  ?  ?  ?                ?  ?  ?  ?  ?  ?  ?  ?  ?  ?  ? ? ?Social Determinants of Health (SDOH) Interventions ?  ? ?Readmission Risk Interventions ?   ? View : No data to display.  ?  ?  ?  ? ? ?

## 2021-05-23 ENCOUNTER — Observation Stay (HOSPITAL_COMMUNITY): Payer: Medicare Other

## 2021-05-23 ENCOUNTER — Telehealth: Payer: PRIVATE HEALTH INSURANCE | Admitting: Cardiology

## 2021-05-23 DIAGNOSIS — N281 Cyst of kidney, acquired: Secondary | ICD-10-CM | POA: Diagnosis not present

## 2021-05-23 DIAGNOSIS — N2889 Other specified disorders of kidney and ureter: Secondary | ICD-10-CM | POA: Diagnosis not present

## 2021-05-23 DIAGNOSIS — K8020 Calculus of gallbladder without cholecystitis without obstruction: Secondary | ICD-10-CM | POA: Diagnosis not present

## 2021-05-23 DIAGNOSIS — N179 Acute kidney failure, unspecified: Secondary | ICD-10-CM | POA: Diagnosis not present

## 2021-05-23 DIAGNOSIS — M47816 Spondylosis without myelopathy or radiculopathy, lumbar region: Secondary | ICD-10-CM | POA: Diagnosis not present

## 2021-05-23 LAB — CBC
HCT: 41.1 % (ref 39.0–52.0)
Hemoglobin: 13.7 g/dL (ref 13.0–17.0)
MCH: 32.6 pg (ref 26.0–34.0)
MCHC: 33.3 g/dL (ref 30.0–36.0)
MCV: 97.9 fL (ref 80.0–100.0)
Platelets: 141 10*3/uL — ABNORMAL LOW (ref 150–400)
RBC: 4.2 MIL/uL — ABNORMAL LOW (ref 4.22–5.81)
RDW: 13.2 % (ref 11.5–15.5)
WBC: 7.6 10*3/uL (ref 4.0–10.5)
nRBC: 0 % (ref 0.0–0.2)

## 2021-05-23 LAB — BASIC METABOLIC PANEL
Anion gap: 7 (ref 5–15)
BUN: 21 mg/dL (ref 8–23)
CO2: 30 mmol/L (ref 22–32)
Calcium: 8.3 mg/dL — ABNORMAL LOW (ref 8.9–10.3)
Chloride: 109 mmol/L (ref 98–111)
Creatinine, Ser: 1.07 mg/dL (ref 0.61–1.24)
GFR, Estimated: >60 mL/min (ref >60)
Glucose, Bld: 121 mg/dL — ABNORMAL HIGH (ref 70–99)
Potassium: 3.1 mmol/L — ABNORMAL LOW (ref 3.5–5.1)
Sodium: 146 mmol/L — ABNORMAL HIGH (ref 135–145)

## 2021-05-23 LAB — URINE CULTURE: Culture: NO GROWTH

## 2021-05-23 LAB — MAGNESIUM: Magnesium: 1.5 mg/dL — ABNORMAL LOW (ref 1.7–2.4)

## 2021-05-23 MED ORDER — LORAZEPAM 2 MG/ML IJ SOLN
1.0000 mg | Freq: Once | INTRAMUSCULAR | Status: DC
Start: 2021-05-23 — End: 2021-05-25

## 2021-05-23 MED ORDER — SODIUM CHLORIDE 0.9 % IV SOLN: INTRAVENOUS | Status: AC

## 2021-05-23 MED ORDER — GADOBUTROL 1 MMOL/ML IV SOLN
10.0000 mL | Freq: Once | INTRAVENOUS | Status: AC | PRN
  Administered 2021-05-23: 10 mL via INTRAVENOUS

## 2021-05-23 MED ORDER — RIVAROXABAN 20 MG PO TABS
20.0000 mg | ORAL_TABLET | Freq: Every day | ORAL | Status: DC
  Administered 2021-05-23 – 2021-05-24 (×2): 20 mg via ORAL
  Filled 2021-05-23 (×2): qty 1

## 2021-05-23 MED ORDER — FINASTERIDE 5 MG PO TABS
5.0000 mg | ORAL_TABLET | Freq: Every day | ORAL | Status: DC
  Administered 2021-05-23 – 2021-05-25 (×3): 5 mg via ORAL
  Filled 2021-05-23 (×3): qty 1

## 2021-05-23 MED ORDER — SODIUM CHLORIDE 0.9 % IV SOLN: INTRAVENOUS | Status: DC

## 2021-05-23 MED ORDER — MAGNESIUM SULFATE 4 GM/100ML IV SOLN
4.0000 g | Freq: Once | INTRAVENOUS | Status: AC
  Administered 2021-05-23: 4 g via INTRAVENOUS
  Filled 2021-05-23: qty 100

## 2021-05-23 MED ORDER — POTASSIUM CHLORIDE CRYS ER 20 MEQ PO TBCR
40.0000 meq | EXTENDED_RELEASE_TABLET | Freq: Once | ORAL | Status: AC
Start: 2021-05-23 — End: 2021-05-23
  Administered 2021-05-23: 40 meq via ORAL
  Filled 2021-05-23: qty 2

## 2021-05-23 NOTE — Plan of Care (Signed)
  Problem: Health Behavior/Discharge Planning: Goal: Ability to safely manage health-related needs after discharge will improve Outcome: Progressing   

## 2021-05-23 NOTE — Progress Notes (Signed)
Physical Therapy Treatment ?Patient Details ?Name: Isaiah Pena ?MRN: [id number] ?DOB: May 15, 1950 ?Today's Date: 05/23/2021 ? ? ?History of Present Illness "[name]" is a 71 year old male presenting to [facility] ED 05/21/21 after 2-3 days of dizziness, weakness, and inability to void. Workup (+) for AKI and urinary retention. Imaging (+) for small L pleural effusion and R kidney cysts. PMH includse Parkinson's disease, L eye vision disruption, BPH, BLE edema, CAD s/p PCI to RCA, and HTN ? ?  ?PT Comments  ? ? The pt was agreeable to session with focus on OOB transfers and initiation of gait. Mobility limited in part by frequent need to transfer to [facility] for BM, but the pt was able to complete ~8 ft ambulation with use of RW and modA. The pt also progressed from [place] of 2 to power up to standing to minA of 1 to complete sit-stand transfers within the session. Discussed DME needed for home if pt not approved for acute inpatient rehab at time of d/c. The pt is making great progress since initial evaluation, but still is at significant risk of falls and at times needs more assist than the pt's wife can safely provide. Therefore continue to recommend AIR as best d/c plan, will plan to return tomorrow for continued progression of mobility and strength.  ?  ?Recommendations for follow up therapy are one component of a multi-disciplinary discharge planning process, led by the attending physician.  Recommendations may be updated based on patient status, additional functional criteria and insurance authorization. ? ?Follow Up Recommendations ? Acute inpatient rehab (3hours/day) ?  ?  ?Assistance Recommended at Discharge Intermittent Supervision/Assistance  ?Patient can return home with the following A lot of help with walking and/or transfers;A little help with bathing/dressing/bathroom;Assistance with cooking/housework;Direct supervision/assist for medications management;Direct supervision/assist for financial management;Assist for  transportation;Help with stairs or ramp for entrance ?  ?Equipment Recommendations ? BSC/3in1;Rolling walker (2 wheels);Wheelchair (measurements PT);Wheelchair cushion (measurements PT)  ?  ?Recommendations for Other Services   ? ? ?  ?Precautions / Restrictions Precautions ?Precautions: Fall ?Restrictions ?Weight Bearing Restrictions: No  ?  ? ?Mobility ? Bed Mobility ?Overal bed mobility: Needs Assistance ?Bed Mobility: Supine to Sit ?  ?  ?Supine to sit: Mod assist, HOB elevated ?  ?  ?General bed mobility comments: modA with cues for each movement of LE and trunk. modA to push to sit from elevated HOB ?  ? ?Transfers ?Overall transfer level: Needs assistance ?Equipment used: Rolling walker (2 wheels), Ambulation equipment used ?Transfers: Sit to/from Stand, Bed to chair/wheelchair/BSC ?Sit to Stand: Mod assist, +2 physical assistance, Min assist ?  ?Step pivot transfers: Mod assist ?  ?  ?  ?General transfer comment: pt initially using modA of 2 to power up from EOB with stedy and transfer to [facility], then to recliner. after that, sit-stand attempted with RW and pt was able to complete with minA of 1 and max cues for hand positioning and technique to reach for RW.  step pivot from chair to [facility] with heavy cues for increased step clearance ?Transfer via Lift Equipment: Stedy ? ?Ambulation/Gait ?Ambulation/Gait assistance: Mod assist, +2 safety/equipment (chair follow) ?Gait Distance (Feet): 8 Feet ?Assistive device: Rolling walker (2 wheels) ?Gait Pattern/deviations: Step-to pattern, Decreased stride length, Shuffle, Festinating ?Gait velocity: decreased ?Gait velocity interpretation: <1.31 ft/sec, indicative of household ambulator ?  ?General Gait Details: pt needing max cues for increased step clearance to break festination, drifting to L with gait ? ? ? ?  ?Balance Overall  balance assessment: Needs assistance ?Sitting-balance support: Single extremity supported, Feet unsupported ?Sitting balance-Leahy Scale:  Poor ?Sitting balance - Comments: required single UE support in sitting with min A for trunk support; cues given to lean forward and decrease posterior trunk lean ?Postural control: Posterior lean ?Standing balance support: Bilateral upper extremity supported, Reliant on assistive device for balance, During functional activity ?Standing balance-Leahy Scale: Poor ?Standing balance comment: reliant on RW and external assist ?  ?  ?  ?  ?  ?  ?  ?  ?  ?  ?  ?  ? ?  ?Cognition Arousal/Alertness: Awake/alert ?Behavior During Therapy: Flat affect ?Overall Cognitive Status: Impaired/Different from baseline ?Area of Impairment: Following commands, Awareness, Problem solving ?  ?  ?  ?  ?  ?  ?  ?  ?  ?  ?  ?Following Commands: Follows one step commands consistently, Follows one step commands with increased time, Follows multi-step commands inconsistently ?  ?Awareness: Emergent ?Problem Solving: Slow processing, Decreased initiation, Difficulty sequencing, Requires verbal cues, Requires tactile cues ?General Comments: pt with parkinsons at baseline, spouse reports increased confusion recently.  Pt able to follow simple commands but requries increased time for problem sovling, initation and sequencing. ?  ?  ? ?  ?Exercises   ? ?  ?General Comments General comments (skin integrity, edema, etc.): spouse present and supportitve, concerned about assist level going home ?  ?  ? ?Pertinent Vitals/Pain Pain Assessment ?Pain Assessment: Faces ?Faces Pain Scale: Hurts a little bit ?Pain Location: back (chronic pain) ?Pain Descriptors / Indicators: Discomfort ?Pain Intervention(s): Limited activity within patient's tolerance, Monitored during session, Repositioned  ? ? ? ?PT Goals (current goals can now be found in the care plan section) Acute Rehab PT Goals ?Patient Stated Goal: to return home ?PT Goal Formulation: With patient/family ?Time For Goal Achievement: 06/05/21 ?Potential to Achieve Goals: Good ?Progress towards PT goals:  Progressing toward goals ? ?  ?Frequency ? ? ? Min 3X/week ? ? ? ?  ?PT Plan Current plan remains appropriate  ? ? ?   ?AM-PAC PT "6 Clicks" Mobility   ?Outcome Measure ? Help needed turning from your back to your side while in a flat bed without using bedrails?: A Lot ?Help needed moving from lying on your back to sitting on the side of a flat bed without using bedrails?: A Lot ?Help needed moving to and from a bed to a chair (including a wheelchair)?: A Little ?Help needed standing up from a chair using your arms (e.g., wheelchair or bedside chair)?: A Lot ?Help needed to walk in hospital room?: Total ?Help needed climbing 3-5 steps with a railing? : Total ?6 Click Score: 11 ? ?  ?End of Session Equipment Utilized During Treatment: Gait belt ?Activity Tolerance: Patient tolerated treatment well ?Patient left: in chair;with call bell/phone within reach;with chair alarm set;with family/visitor present ?Nurse Communication: Mobility status ?PT Visit Diagnosis: Unsteadiness on feet (R26.81);Other abnormalities of gait and mobility (R26.89);Muscle weakness (generalized) (M62.81);Difficulty in walking, not elsewhere classified (R26.2) ?  ? ? ?Time: 7124-5809 ?PT Time Calculation (min) (ACUTE ONLY): 51 min ? ?Charges:  $Gait Training: 8-22 mins ?$Therapeutic Exercise: 8-22 mins ?$Therapeutic Activity: 8-22 mins          ?          ? ?West Carbo, PT, DPT  ? ?Acute Rehabilitation Department ?Pager #: [phone] - [phone] ? ? ?Sandra Cockayne ?05/23/2021, 4:11 PM ? ?

## 2021-05-23 NOTE — Progress Notes (Signed)
Mobility Specialist Progress Note: ? ? 05/23/21 1615  ?Mobility  ?Activity Transferred to/from [facility]  ?Level of Assistance Minimal assist, patient does 75% or more ?(+2)  ?Assistive Device None  ?Distance Ambulated (ft) 2 ft  ?Activity Response Tolerated well  ?$Mobility charge 1 Mobility  ? ?Pt requesting to transfer to [facility]. Required minA+2 to stand and pivot. Pt left on BSC with call bell in reach, will f/u to get pt back to bed.  ? ?Nelta Numbers ?Acute Rehab ?Phone: [phone] ?Office Phone: [phone] ? ?

## 2021-05-23 NOTE — Progress Notes (Addendum)
HOSPITAL MEDICINE OVERNIGHT EVENT NOTE   ? ?Notified by nursing that patient has recently had his Foley catheter removed at approximately 6 PM and has been unable to void since.  Patient has since been complaining of increasing lower abdominal pain resulting in a bladder scan which revealed 524 cc in the bladder. ? ?Chart reviewed, Foley catheter was removed in an effort to perform a voiding trial.   For now, we will go ahead and perform an in and out catheter and additionally order bladder scans every 6 hours with intermittent catheterization for recurrent retention. ? ?We will allow the day attending to reevaluate the patient in the morning and determine whether an indwelling Foley catheter is indicated. ? ?Isaiah Emerald  MD ?[facility]  ? ?ADDENDUM 5/4 11:30pm ? ?In and out catheter resulted in 1000 cc drained from the bladder with symptomatic relief. ? ?Isaiah Pena ? ?ADDENDUM (5/5 6:45am) ? ?Notified by nursing that patient is once again experiencing severe lower abdominal pain.  Bladder scan repeated revealing 524 cc in the bladder. ? ?With the patient developing worsening abdominal pain patient suddenly went into rapid atrial fibrillation with heart rates in excess of 140 bpm. ? ?I went to go evaluate the patient at the bedside.  Patient is visibly in distress due to substantial lower abdominal pain and is visibly diaphoretic.  Patient denies chest pain.  Heart is tachycardic and irregularly irregular.  Lower abdominal tenderness noted on exam.   ? ?It is clear at this point the patient is failed his voiding trial and therefore I am and ordering an indwelling Foley catheter.  Patient is requesting Tylenol for his abdominal pain and therefore this will be administered. ? ?Concerning the rapid atrial fibrillation I hope that this will substantially improved after relieving patient's abdominal pain with Foley catheter placement and administration of Tylenol.  I am additionally providing patient  with an additional dose of 5 mg of intravenous metoprolol.   ? ?Patient also found to be hypokalemic with potassium of 3.0.  Replacing with both 40 mill equivalents of oral and 40 mill equivalents of IV potassium in the hopes that this will additionally address patient's rapid atrial fibrillation. ? ?Isaiah Pena ? ? ? ? ? ? ? ? ? ? ?

## 2021-05-23 NOTE — [place] FL2 (Signed)
?[place] MEDICAID FL2 LEVEL OF CARE SCREENING TOOL  ?  ? ?IDENTIFICATION  ?Patient Name: ?Isaiah Pena Birthdate: 11-29-1950 Sex: male Admission Date (Current Location): ?05/21/2021  ?[place] and [place] Number: ? [place] ?  Facility and Address:  ?The . [facility], [place] [address], [place], [place] [address] ?     Provider Number: ?[id number]  ?Attending Physician Name and Address:  ?Deatra James, MD ? Relative Name and Phone Number:  ?Edith Lord, [phone] ?   ?Current Level of Care: ?Hospital Recommended Level of Care: ?[place] Prior Approval Number: ?  ? ?Date Approved/Denied: ?  PASRR Number: ?[id number] A ? ?Discharge Plan: ?SNF ?  ? ?Current Diagnoses: ?Patient Active Problem List  ? Diagnosis Date Noted  ? AKI with acute urinary retention  05/21/2021  ? Constipation 05/21/2021  ? Right renal mass 05/21/2021  ? Leukocytosis 05/21/2021  ? Parkinson disease ([place]) 05/21/2021  ? Persistent atrial fibrillation ([place]) 11/08/2012  ? Central serous retinopathy   ? Hypertension   ? GERD (gastroesophageal reflux disease)   ? Kidney stone   ? History of torn meniscus of right knee   ? Edema of extremities   ? Hyperlipidemia   ? Coronary artery disease s/p PCI to RCA    ? OSA (obstructive sleep apnea) 09/29/2011  ? ? ?Orientation RESPIRATION BLADDER Height & Weight   ?  ?Self, Time, Situation, Place ? Normal Incontinent, Indwelling catheter Weight: 262 lb 12.6 oz (119.2 kg) ?Height:  '5\' 8"'$  (172.7 cm)  ?BEHAVIORAL SYMPTOMS/MOOD NEUROLOGICAL BOWEL NUTRITION STATUS  ?    Continent Diet  ?AMBULATORY STATUS COMMUNICATION OF NEEDS Skin   ?Extensive Assist Verbally Skin abrasions ([place] and leg blister) ?  ?  ?  ?    ?     ?     ? ? ?Personal Care Assistance Level of Assistance  ?Bathing, Feeding, Dressing Bathing Assistance: Maximum assistance ?Feeding assistance: Maximum assistance ?Dressing Assistance: Maximum assistance ?   ? ?Functional Limitations Info  ?Sight, Hearing,  Speech Sight Info: Adequate ?Hearing Info: Adequate ?Speech Info: Adequate  ? ? ?SPECIAL CARE FACTORS FREQUENCY  ?PT (By licensed PT), OT (By licensed OT)   ?  ?PT Frequency: 5x week ?OT Frequency: 5x week ?  ?  ?  ?   ? ? ?Contractures Contractures Info: Not present  ? ? ?Additional Factors Info  ?Code Status, Allergies, Psychotropic Code Status Info: DNR ?Allergies Info: Sulfa Antibiotics ?Psychotropic Info: Lorazepam ?  ?  ?   ? ?Current Medications (05/23/2021):  This is the current hospital active medication list ?Current Facility-Administered Medications  ?Medication Dose Route Frequency Provider Last Rate Last Admin  ? acetaminophen (TYLENOL) tablet 650 mg  650 mg Oral Q6H PRN Orma Flaming, MD   650 mg at 05/22/21 1650  ? Or  ? acetaminophen (TYLENOL) suppository 650 mg  650 mg Rectal Q6H PRN Orma Flaming, MD      ? amantadine (SYMMETREL) capsule 100 mg  100 mg Oral Daily Orma Flaming, MD   100 mg at 05/23/21 0111  ? atorvastatin (LIPITOR) tablet 10 mg  10 mg Oral Auburn Bilberry, MD   10 mg at 05/22/21 8469  ? And  ? atorvastatin (LIPITOR) tablet 5 mg  5 mg Oral Auburn Bilberry, MD   5 mg at 05/21/21 1724  ? bisacodyl (DULCOLAX) suppository 10 mg  10 mg Rectal Once Domenic Polite, MD      ? carbidopa-levodopa (SINEMET IR) 25-100 MG per tablet  immediate release 3 tablet  3 tablet Oral Q8H Orma Flaming, MD   3 tablet at 05/23/21 [phone]  ? Chlorhexidine Gluconate Cloth 2 % PADS 6 each  6 each Topical Daily Domenic Polite, MD   6 each at 05/23/21 [phone]  ? clonazePAM (KLONOPIN) disintegrating tablet 0.25 mg  0.25 mg Oral BID PRN Orma Flaming, MD   0.25 mg at 05/22/21 6712  ? entacapone (COMTAN) tablet 200 mg  200 mg Oral Q8H Orma Flaming, MD   200 mg at 05/23/21 4580  ? ezetimibe (ZETIA) tablet 10 mg  10 mg Oral QPM Orma Flaming, MD   10 mg at 05/22/21 1650  ? finasteride (PROSCAR) tablet 5 mg  5 mg Oral Daily Shahmehdi, Seyed A, MD   5 mg at 05/23/21 1336  ? LORazepam (ATIVAN) injection 1 mg   1 mg Intravenous Once Shahmehdi, Seyed A, MD      ? metoprolol tartrate (LOPRESSOR) tablet 50 mg  50 mg Oral BID Orma Flaming, MD   50 mg at 05/23/21 9983  ? ondansetron (ZOFRAN) tablet 4 mg  4 mg Oral Q6H PRN Orma Flaming, MD      ? Or  ? ondansetron [facility]) injection 4 mg  4 mg Intravenous Q6H PRN Orma Flaming, MD      ? pantoprazole (PROTONIX) EC tablet 40 mg  40 mg Oral Daily Orma Flaming, MD   40 mg at 05/23/21 0857  ? polyethylene glycol (MIRALAX / GLYCOLAX) packet 17 g  17 g Oral Daily Orma Flaming, MD   17 g at 05/23/21 0857  ? rivaroxaban (XARELTO) tablet 20 mg  20 mg Oral Q supper Shahmehdi, Seyed A, MD      ? senna-docusate (Senokot-S) tablet 1 tablet  1 tablet Oral BID Domenic Polite, MD      ? tadalafil (CIALIS) tablet 5 mg  5 mg Oral QPM Orma Flaming, MD   5 mg at 05/22/21 2221  ? traMADol (ULTRAM) tablet 50 mg  50 mg Oral TID PRN Orma Flaming, MD   50 mg at 05/21/21 2208  ? ? ? ?Discharge Medications: ?Please see discharge summary for a list of discharge medications. ? ?Relevant Imaging Results: ? ?Relevant Lab Results: ? ? ?Additional Information ?SS# [phone] ? ?Coralee Pesa, LCSWA ? ? ? ? ?

## 2021-05-23 NOTE — Progress Notes (Signed)
NT3 pulled foley. Patient tolerated well and expressed 0/10 pain. External male primofit to suction set up to collect output. ?

## 2021-05-23 NOTE — Progress Notes (Signed)
Mobility Specialist Progress Note: ? ? 05/23/21 1600  ?Mobility  ?Activity Transferred from chair to bed  ?Level of Assistance Minimal assist, patient does 75% or more ?(+2)  ?Assistive Device Front wheel walker  ?Distance Ambulated (ft) 2 ft  ?Activity Response Tolerated well  ?$Mobility charge 1 Mobility  ? ?Pt unable to tolerate sitting in chair after PT session. Wife came in hallway requesting assistance to get pt back to bed. Required minA+2 to stand from chair and pivot to bed. Left in bed with all needs met, visitors present.  ? ?Nelta Numbers ?Acute Rehab ?Phone: [phone] ?Office Phone: [phone] ? ?

## 2021-05-23 NOTE — Social Work (Signed)
CSW spoke with pt spouse(in room), pt spouse asked CSW to follow up tomorrow bc she was not available at the moment and they need to discuss SNF.  ?

## 2021-05-23 NOTE — Progress Notes (Signed)
?PROGRESS NOTE ? ? ? ?Isaiah Pena  OZH:[id number] DOB: Aug 17, 1950 DOA: 05/21/2021 ?PCP: Mayra Neer, MD  ? ? ?Subjective: ?-The patient was seen and examined this morning, reporting feeling better, Foley catheter still in place ?Wife present at bedside-stating that they have no help at home ?Complain severe generalized weaknesses ? ?We are recommending trial of voiding-today without catheter, and further imaging with MRI for right renal cyst versus mass ?Also CIR consult for inpatient rehab ? ?They agree to current plan! ? ?-------------------------------------------------------------------------------------------------------------- ?Isaiah Pena is a 71/M with history of Parkinson's disease, paroxysmal A-fib on  xarelto, CAD s/p PCI to RCA, HTN, HLD, OSA on bipap, BPH, PD who presented to ED with complaints of dizziness and weakness x 3-4 days. He also has had urinary retention and constipation.  Wife also reported shakes tremors and intermittent confusion in the last few days. ?-In the ED labs noted WBC of 12.3, creatinine of 4.7, CXR: cardiomegaly, small left pleural effusion, CT abdomen: Nonspecific bladder distension and perivesical soft tissue stranding . Findings may be secondary to bladder outlet obstruction from ?prostate gland enlargement, Mild bilateral hydronephrosis without evidence of obstructing ureteral calculus.  ?Complex peripherally calcified mass in lower pole of right kidney, incompletely characterized without contrast. Urothelial malignancy can not be excluded.  ?In ED: foley placed> 2 L drained ? ? ?--------------------------------------------------------------------------------------------------------------- ? ? ? ?Assessment and Plan: ? ?AKI with acute urinary retention  ?-Initiating finasteride (not using Flomax due to sulfa allergies) ?-Voiding trial today ? ? ?-BPH noted on imaging, suspect symptoms likely secondary to this, could have neurogenic bladder issues related to Parkinson's  disease as well ?-Followed by Dr. Diona Fanti, has failed alpha blockers and other meds in the past, currently on Myrbetrix, tadalafil and tolterodine --read trying to finish trying ? ?-Now with Foley catheter, called and discussed with urology on-call Dr. McDiarmid, recommended Foley catheter at discharge and close follow-up with urology in 1 to 2 weeks for definitive management and assessment of right renal mass ?-Discontinue IV fluid today ?-Clinically do not suspect infection, follow-up urine cultures ?-Creatinine improving, ? ?Right renal mass ?CT finding of complex peripherally calcified mass in lower pole of right kidney, incompletely characterized without contrast. Malignancy not excluded ?Renal US with complex cystic mass, calcifications in the right kidney ?-Obtaining MRI abdomen pelvis for further evaluation >>  ?-Follow[facility] ? ? ?Constipation ?-Continue MiraLAX, add Senokot, Dulcolax suppository ? ?Persistent atrial fibrillation ([place]) ?Rate controlled ?Continue xarelto and Lopressor ? ?Parkinson disease ([place]) ?Has worsening tremors, likely secondary to AKI and renal retention of meds ?-overall improving with improvement in AKI ?Continue home comtan, sinemet IR and symmetrel ?PT consult --- recommending CIR, consulted ?Delirium precautions ? ?Hypertension ?Well controlled.  ?Continue home lopressor '50mg'$  BID ?Will continue norvasc '10mg'$ , but hold ARB with AKI  ? ?Coronary artery disease s/p PCI to RCA  ?No chest pain ?Continue medical management with lopressor, lipitor and xarelto  ? ?Hyperlipidemia ?Continue lipitor  ? ?GERD (gastroesophageal reflux disease) ?Continue PPI  ? ?OSA (obstructive sleep apnea) ?Does not wear cpap/bipap at night  ? ?DVT prophylaxis: Xarelto ?Code Status: DNR ?Family Communication: Discussed with wife at bedside ?Disposition Plan: Home versus CIR in next 24 hours ? ?Consultants:  ?None, called and discussed with urology on-call Dr. McDiarmid ? ?Procedures:   ? ?Antimicrobials:  ? ? ?Objective: ?Vitals:  ? 05/22/21 2040 05/23/21 0517 05/23/21 0851 05/23/21 1006  ?BP: (!) 138/94 (!) 157/103 134/83 (!) 145/92  ?Pulse: 79 (!) 106 (!) 121 98  ?  Resp: '18 18  20  '$ ?Temp: 98.3 ?F (36.8 ?C)   98.4 ?F (36.9 ?C)  ?TempSrc: Oral Oral  Oral  ?SpO2: 96% 95%    ?Weight:  119.2 kg    ?Height:      ? ? ?Intake/Output Summary (Last 24 hours) at 05/23/2021 1122 ?Last data filed at 05/23/2021 0654 ?Gross per 24 hour  ?Intake 1021.11 ml  ?Output 4000 ml  ?Net -2978.89 ml  ? ?Filed Weights  ? 05/22/21 0800 05/22/21 1427 05/23/21 0517  ?Weight: 118.8 kg 121.5 kg 119.2 kg  ? ? ? ? ? ?Physical Exam: ?  ?General:  AAO x 3,  cooperative, no distress;   ?HEENT:  Normocephalic, PERRL, otherwise with in Normal limits   ?Neuro:  CNII-XII intact. , normal motor and sensation, reflexes intact   ?Lungs:   Clear to auscultation BL, Respirations unlabored,  ?No wheezes / crackles  ?Cardio:    S1/S2, RRR, No murmure, No Rubs or Gallops   ?Abdomen:  Soft, non-tender, bowel sounds active all four quadrants, ?no guarding or peritoneal signs.  ?Muscular  ?skeletal:  Limited exam -global generalized weaknesses ?- in bed, able to move all 4 extremities,   ?2+ pulses,  symmetric, No pitting edema  ?Skin:  Dry, warm to touch, negative for any Rashes,  ?Wounds: Please see nursing documentation ?   ?Foley cath in place ? ? ?  ? ?Data Reviewed:  ? ?CBC: ?Recent Labs  ?Lab 05/21/21 ?1203 05/22/21 ?0213 05/23/21 ?0240  ?WBC 12.3* 8.5 7.6  ?NEUTROABS 10.5*  --   --   ?HGB 15.3 14.3 13.7  ?HCT 45.4 40.8 41.1  ?MCV 98.3 97.4 97.9  ?PLT 135* 143* 141*  ? ?Basic Metabolic Panel: ?Recent Labs  ?Lab 05/21/21 ?1203 05/22/21 ?0150 05/22/21 ?0213 05/23/21 ?7494 05/23/21 ?0240  ?NA 140  --  143  --  146*  ?K 3.7  --  3.1*  --  3.1*  ?CL 106  --  111  --  109  ?CO2 25  --  25  --  30  ?GLUCOSE 129*  --  130*  --  121*  ?BUN 57*  --  47*  --  21  ?CREATININE 4.70*  --  2.59*  --  1.07  ?CALCIUM 8.7*  --  8.3*  --  8.3*  ?MG  --  2.0  --   1.5*  --   ?PHOS  --  3.7  --   --   --   ? ?GFR: ?Estimated Creatinine Clearance: 80.6 mL/min (by C-G formula based on SCr of 1.07 mg/dL). ?Liver Function Tests: ?Recent Labs  ?Lab 05/21/21 ?1203  ?AST 15  ?ALT <5  ?ALKPHOS 70  ?BILITOT 1.5*  ?PROT 6.2*  ?ALBUMIN 3.5  ? ? ?Thyroid Function Tests: ?Recent Labs  ?  05/22/21 ?0150  ?TSH 0.640  ? ?Anemia Panel: ?No results for input(s): VITAMINB12, FOLATE, FERRITIN, TIBC, IRON, RETICCTPCT in the last 72 hours. ?Urine analysis: ?   ?Component Value Date/Time  ? COLORURINE YELLOW 05/21/2021 1548  ? APPEARANCEUR HAZY (A) 05/21/2021 1548  ? LABSPEC 1.009 05/21/2021 1548  ? PHURINE 5.0 05/21/2021 1548  ? GLUCOSEU NEGATIVE 05/21/2021 1548  ? HGBUR LARGE (A) 05/21/2021 1548  ? [place] NEGATIVE 05/21/2021 1548  ? [place] NEGATIVE 05/21/2021 1548  ? PROTEINUR 30 (A) 05/21/2021 1548  ? NITRITE NEGATIVE 05/21/2021 1548  ? LEUKOCYTESUR NEGATIVE 05/21/2021 1548  ? ?Sepsis Labs: ?'@LABRCNTIP'$ (procalcitonin:4,lacticidven:4) ? ?)No results found for this or any previous visit (from the past  240 hour(s)).  ? ?Radiology Studies: ?CT ABDOMEN PELVIS WO CONTRAST ? ?Result Date: 05/21/2021 ?CLINICAL DATA:  Abdominal pain, acute, nonlocalized with new acute kidney injury and urinary retention. Dizziness with weakness for 3-4 days. EXAM: CT ABDOMEN AND PELVIS WITHOUT CONTRAST TECHNIQUE: Multidetector CT imaging of the abdomen and pelvis was performed following the standard protocol without IV contrast. RADIATION DOSE REDUCTION: This exam was performed according to the departmental dose-optimization program which includes automated exposure control, adjustment of the mA and/or kV according to patient size and/or use of iterative reconstruction technique. COMPARISON:  Abdominal radiographs 05/21/2021. Report only from remote abdominal CT 07/07/2000-unavailable for direct comparison. FINDINGS: Lower chest: Mild linear atelectasis or scarring at both lung bases. No significant pleural or  pericardial effusion. Atherosclerosis of the aorta and coronary arteries. Hepatobiliary: No focal hepatic abnormalities are identified on noncontrast imaging. There are small dependent calcified gallstones. N

## 2021-05-23 NOTE — Progress Notes (Signed)
Inpatient Rehab Admissions Coordinator:  ? ?Consult received.  Pt remains observation status and does not meet Medicare guidelines for CIR admission at this time.   ? ?Shann Medal, PT, DPT ?Admissions Coordinator ?[phone] ?05/23/21  ?11:58 AM ? ?

## 2021-05-23 NOTE — Progress Notes (Signed)
Pt. C/o pain to lower abdomen, HR 120s, pt. Diaphoretic. Foley catheter pulled at 6p per day shift RN, pt. To begin voiding trial.  No urine output noted in suction canister via external catheter at 2300. On call for [facility] paged to make aware. New orders received to perform bladder scan and intermittent catheterization.  Bladder scan noted 524 cc in pts. Bladder. Intermittent cath performed, 1000 cc of urine noted. Pt. HR now under 100. Pt. States he feels better. On call for [facility] made aware.  ?

## 2021-05-24 DIAGNOSIS — Z87442 Personal history of urinary calculi: Secondary | ICD-10-CM | POA: Diagnosis not present

## 2021-05-24 DIAGNOSIS — I4819 Other persistent atrial fibrillation: Secondary | ICD-10-CM | POA: Diagnosis not present

## 2021-05-24 DIAGNOSIS — E669 Obesity, unspecified: Secondary | ICD-10-CM | POA: Diagnosis not present

## 2021-05-24 DIAGNOSIS — N400 Enlarged prostate without lower urinary tract symptoms: Secondary | ICD-10-CM | POA: Diagnosis present

## 2021-05-24 DIAGNOSIS — Z66 Do not resuscitate: Secondary | ICD-10-CM | POA: Diagnosis present

## 2021-05-24 DIAGNOSIS — I4821 Permanent atrial fibrillation: Secondary | ICD-10-CM | POA: Diagnosis present

## 2021-05-24 DIAGNOSIS — K5901 Slow transit constipation: Secondary | ICD-10-CM | POA: Diagnosis present

## 2021-05-24 DIAGNOSIS — R339 Retention of urine, unspecified: Secondary | ICD-10-CM | POA: Diagnosis not present

## 2021-05-24 DIAGNOSIS — Z741 Need for assistance with personal care: Secondary | ICD-10-CM | POA: Diagnosis present

## 2021-05-24 DIAGNOSIS — R338 Other retention of urine: Secondary | ICD-10-CM | POA: Diagnosis present

## 2021-05-24 DIAGNOSIS — Z79899 Other long term (current) drug therapy: Secondary | ICD-10-CM | POA: Diagnosis not present

## 2021-05-24 DIAGNOSIS — M549 Dorsalgia, unspecified: Secondary | ICD-10-CM | POA: Diagnosis present

## 2021-05-24 DIAGNOSIS — F419 Anxiety disorder, unspecified: Secondary | ICD-10-CM | POA: Diagnosis present

## 2021-05-24 DIAGNOSIS — E785 Hyperlipidemia, unspecified: Secondary | ICD-10-CM | POA: Diagnosis present

## 2021-05-24 DIAGNOSIS — R5381 Other malaise: Secondary | ICD-10-CM

## 2021-05-24 DIAGNOSIS — N138 Other obstructive and reflux uropathy: Secondary | ICD-10-CM | POA: Diagnosis present

## 2021-05-24 DIAGNOSIS — Z6838 Body mass index (BMI) 38.0-38.9, adult: Secondary | ICD-10-CM | POA: Diagnosis not present

## 2021-05-24 DIAGNOSIS — G4733 Obstructive sleep apnea (adult) (pediatric): Secondary | ICD-10-CM | POA: Diagnosis present

## 2021-05-24 DIAGNOSIS — G20 Parkinson's disease: Secondary | ICD-10-CM | POA: Diagnosis present

## 2021-05-24 DIAGNOSIS — N529 Male erectile dysfunction, unspecified: Secondary | ICD-10-CM | POA: Diagnosis present

## 2021-05-24 DIAGNOSIS — N179 Acute kidney failure, unspecified: Secondary | ICD-10-CM | POA: Diagnosis present

## 2021-05-24 DIAGNOSIS — I119 Hypertensive heart disease without heart failure: Secondary | ICD-10-CM | POA: Diagnosis present

## 2021-05-24 DIAGNOSIS — N320 Bladder-neck obstruction: Secondary | ICD-10-CM | POA: Diagnosis present

## 2021-05-24 DIAGNOSIS — K5909 Other constipation: Secondary | ICD-10-CM | POA: Diagnosis present

## 2021-05-24 DIAGNOSIS — E876 Hypokalemia: Secondary | ICD-10-CM | POA: Diagnosis present

## 2021-05-24 DIAGNOSIS — K5900 Constipation, unspecified: Secondary | ICD-10-CM | POA: Diagnosis not present

## 2021-05-24 DIAGNOSIS — H35712 Central serous chorioretinopathy, left eye: Secondary | ICD-10-CM | POA: Diagnosis present

## 2021-05-24 DIAGNOSIS — N2889 Other specified disorders of kidney and ureter: Secondary | ICD-10-CM | POA: Diagnosis present

## 2021-05-24 DIAGNOSIS — I10 Essential (primary) hypertension: Secondary | ICD-10-CM | POA: Diagnosis present

## 2021-05-24 DIAGNOSIS — N1330 Unspecified hydronephrosis: Secondary | ICD-10-CM | POA: Diagnosis present

## 2021-05-24 DIAGNOSIS — I2510 Atherosclerotic heart disease of native coronary artery without angina pectoris: Secondary | ICD-10-CM | POA: Diagnosis present

## 2021-05-24 DIAGNOSIS — N401 Enlarged prostate with lower urinary tract symptoms: Secondary | ICD-10-CM | POA: Diagnosis present

## 2021-05-24 DIAGNOSIS — I4891 Unspecified atrial fibrillation: Secondary | ICD-10-CM | POA: Diagnosis not present

## 2021-05-24 DIAGNOSIS — K219 Gastro-esophageal reflux disease without esophagitis: Secondary | ICD-10-CM | POA: Diagnosis present

## 2021-05-24 DIAGNOSIS — Z7901 Long term (current) use of anticoagulants: Secondary | ICD-10-CM | POA: Diagnosis not present

## 2021-05-24 DIAGNOSIS — D72829 Elevated white blood cell count, unspecified: Secondary | ICD-10-CM | POA: Diagnosis present

## 2021-05-24 LAB — BASIC METABOLIC PANEL
Anion gap: 7 (ref 5–15)
BUN: 12 mg/dL (ref 8–23)
CO2: 29 mmol/L (ref 22–32)
Calcium: 8.6 mg/dL — ABNORMAL LOW (ref 8.9–10.3)
Chloride: 107 mmol/L (ref 98–111)
Creatinine, Ser: 0.73 mg/dL (ref 0.61–1.24)
GFR, Estimated: >60 mL/min (ref >60)
Glucose, Bld: 134 mg/dL — ABNORMAL HIGH (ref 70–99)
Potassium: 3.2 mmol/L — ABNORMAL LOW (ref 3.5–5.1)
Sodium: 143 mmol/L (ref 135–145)

## 2021-05-24 MED ORDER — POLYETHYLENE GLYCOL 3350 17 G PO PACK: 17.0000 g | PACK | Freq: Every day | ORAL | 0 refills | Status: DC

## 2021-05-24 MED ORDER — POTASSIUM CHLORIDE 10 MEQ/100ML IV SOLN
10.0000 meq | INTRAVENOUS | Status: AC
  Administered 2021-05-24 (×3): 10 meq via INTRAVENOUS
  Filled 2021-05-24 (×2): qty 100

## 2021-05-24 MED ORDER — POTASSIUM CHLORIDE CRYS ER 20 MEQ PO TBCR
40.0000 meq | EXTENDED_RELEASE_TABLET | Freq: Once | ORAL | Status: AC
Start: 2021-05-24 — End: 2021-05-24
  Administered 2021-05-24: 40 meq via ORAL
  Filled 2021-05-24: qty 2

## 2021-05-24 MED ORDER — MAGNESIUM SULFATE 2 GM/50ML IV SOLN
2.0000 g | Freq: Once | INTRAVENOUS | Status: AC
  Administered 2021-05-24: 2 g via INTRAVENOUS
  Filled 2021-05-24: qty 50

## 2021-05-24 MED ORDER — METOPROLOL TARTRATE 5 MG/5ML IV SOLN: 5.0000 mg | Freq: Once | INTRAVENOUS | Status: DC

## 2021-05-24 MED ORDER — SENNOSIDES-DOCUSATE SODIUM 8.6-50 MG PO TABS: 1.0000 | ORAL_TABLET | Freq: Two times a day (BID) | ORAL | 0 refills | Status: AC

## 2021-05-24 MED ORDER — POTASSIUM CHLORIDE 10 MEQ/100ML IV SOLN
INTRAVENOUS | Status: AC
  Filled 2021-05-24: qty 100

## 2021-05-24 MED ORDER — POTASSIUM CHLORIDE 10 MEQ/100ML IV SOLN
10.0000 meq | Freq: Once | INTRAVENOUS | Status: AC
  Administered 2021-05-24: 10 meq via INTRAVENOUS
  Filled 2021-05-24: qty 100

## 2021-05-24 NOTE — Progress Notes (Signed)
Inpatient Rehab Admissions Coordinator:  ? ?Received call from [facility] and note pt now inpatient status.  Will need to review with Dr. Naaman Plummer for medical necessity before confirming whether pt is truly a candidate for CIR or not.  I will place a consult order and ask my colleague, Gayland Curry to follow up tomorrow after case reviewed with Dr. Naaman Plummer.  ? ?Shann Medal, PT, DPT ?Admissions Coordinator ?[phone] ?05/24/21  ?4:26 PM ? ?

## 2021-05-24 NOTE — Plan of Care (Signed)
  Problem: Health Behavior/Discharge Planning: Goal: Ability to manage health-related needs will improve Outcome: Progressing   Problem: Clinical Measurements: Goal: Respiratory complications will improve Outcome: Progressing   

## 2021-05-24 NOTE — Hospital Course (Addendum)
Isaiah Pena is a 71/M with history of Parkinson's disease, paroxysmal A-fib on  xarelto, CAD s/p PCI to RCA, HTN, HLD, OSA on bipap, BPH, PD who presented to ED with complaints of dizziness and weakness x 3-4 days. He also has had urinary retention and constipation.  He was found to have acute kidney injury on blood work.  Bladder distention was noted on CT scan.  He was thought to have bladder outlet obstruction from prostate gland enlargement.  Mild bilateral hydronephrosis was noted.  Patient failed voiding trial.  Foley catheter had to be reinserted.  Plan will be for him to be discharged with Foley catheter. ?-BPH noted on imaging, suspect symptoms likely secondary to this, could have neurogenic bladder issues related to Parkinson's disease as well ?-Followed by Dr. Diona Fanti, has failed alpha blockers and other meds in the past, currently on Myrbetrix, tadalafil and tolterodine  ?-Now with Foley catheter, this was discussed with urology on-call Dr. McDiarmid, recommended Foley catheter at discharge and close follow-up with urology in 1 to 2 weeks for definitive management and assessment of right renal mass. ?

## 2021-05-24 NOTE — Assessment & Plan Note (Addendum)
PT/OT evaluation, recommending CIR versus SNF ?-Recommending aggressive PT/OT, fall precautions ?-The patient is wife living alone at home with no assist ?-Agreeable to SNF ?TOC assisting with placement ?

## 2021-05-24 NOTE — Discharge Summary (Signed)
?Physician Discharge Summary ?  ?Patient: Isaiah Pena MRN: [id number] DOB: 09-Jun-1950  ?Admit date:     05/21/2021  ?Discharge date: 05/24/21  ?Discharge Physician: Valeria Batman Tasheena [place]  ? ?PCP: Mayra Neer, MD  ? ?Recommendations at discharge:  ?Follow-up with PCP in 2 weeks ?Follow-up with urologist Dr. Nicola Girt by Dr. Diona Fanti, ?Continue with current Foley catheter as it was reinserted in the morning of 05/24/2021 ?Continue currently recommended meds ? ?Discharge Diagnoses: ?Principal Problem: ?  AKI with acute urinary retention  ?Active Problems: ?  Debility ?  Right renal mass ?  Constipation ?  Leukocytosis ?  Persistent atrial fibrillation ([place]) ?  Parkinson disease ([place]) ?  Hypertension ?  Coronary artery disease s/p PCI to RCA  ?  Hyperlipidemia ?  GERD (gastroesophageal reflux disease) ?  OSA (obstructive sleep apnea) ? ?Resolved Problems: ?  BPH (benign prostatic hyperplasia) ? ?Hospital Course: ?Isaiah Pena is a 71/M with history of Parkinson's disease, paroxysmal A-fib on  xarelto, CAD s/p PCI to RCA, HTN, HLD, OSA on bipap, BPH, PD who presented to ED with complaints of dizziness and weakness x 3-4 days. He also has had urinary retention and constipation.  Wife also reported shakes tremors and intermittent confusion in the last few days. ?-In the ED labs noted WBC of 12.3, creatinine of 4.7, CXR: cardiomegaly, small left pleural effusion, CT abdomen: Nonspecific bladder distension and perivesical soft tissue stranding . Findings may be secondary to bladder outlet obstruction from ?prostate gland enlargement, Mild bilateral hydronephrosis without evidence of obstructing ureteral calculus.  ?Complex peripherally calcified mass in lower pole of right kidney, incompletely characterized without contrast. Urothelial malignancy can not be excluded.  ?In ED: foley placed> 2 L drained ? ? ? ?-BPH noted on imaging, suspect symptoms likely secondary to this, could have neurogenic bladder issues related to  Parkinson's disease as well ?-Followed by Dr. Diona Fanti, has failed alpha blockers and other meds in the past, currently on Myrbetrix, tadalafil and tolterodine --read trying to finish trying ?  ?-Now with Foley catheter, called and discussed with urology on-call Dr. McDiarmid, recommended Foley catheter at discharge and close follow-up with urology in 1 to 2 weeks for definitive management and assessment of right renal mass ?-Discontinue IV fluid ? ?Assessment and Plan: ?* AKI with acute urinary retention  ?71 year old male with history of PD presenting with 1 week history of urinary rentention and worsening tremors found to have AKI  ? ?-AKI likely to obstructive pathology from acute urinary retention in setting of PD ?-foley placed in ED-strict I/O-already over 1500cc collected  ?-Foley catheter was subsequently discontinued 05/23/2021 for trial of voiding which she has failed miserably ?-Had to be reinserted overnight 05/24/2021 ?-gentle, time limited IVF--discontinued ?-check renal US (mass see below)  ?-urine studies and UA /culture no growth ?-Resumed detrol LA  ?-Much improved ? ?Debility ?- PT/OT evaluation, recommending CIR versus SNF ?-Recommending aggressive PT/OT, fall precautions ?-The patient is wife living alone at home with no assist ?-Agreeable to SNF ?TOC assisting with placement ? ?Right renal mass ?CT finding of complex peripherally calcified mass in lower pole of right kidney, incompletely characterized without contrast. Malignancy not excluded ?Renal US ordered,  ?Follows with [facility]  ?Followed by MRI with contrast of abdomen pelvis, consistent with cystic lesion ? ?Constipation ?No BM In 3 days with no obstruction on imaging ?Common in setting of PD and urinary retention ?Foley placed to decompress bladder ?Successfully having BMs now ?Continue bowel regimen, MiraLAX,  Senokot ? ?Leukocytosis ?No signs of infection, likely reactive ?Continue to trend fever curve and follow cbc   ?Improved ? ?Persistent atrial fibrillation ([place]) ?Rate controlled ?Continue xarelto ?Continue lopressor BID ? ?Parkinson disease ([place]) ?Has worsening tremors, unsure if uremia can play a part in this  ?Continue home comtan, sinemet IR and symmetrel ?PT consult -recommended CIR versus SNF ?Delirium precautions ? ?Hypertension ?Well controlled.  ?Continue home lopressor '50mg'$  BID ?Will continue norvasc '10mg'$ , ?Were holding  ARB with AKI  ? ?Coronary artery disease s/p PCI to RCA  ?No chest pain ?Continue medical management with lopressor, lipitor and xarelto  ? ?Hyperlipidemia ?Continue lipitor  ? ?GERD (gastroesophageal reflux disease) ?Continue PPI  ? ?OSA (obstructive sleep apnea) ?Does not wear cpap/bipap at night  ? ?DVT prophylaxis: Xarelto ?Code Status: DNR ?Family Communication: Discussed with wife at bedside ?Disposition Plan: SNF ? ?  ?Consultants:  ?None, called and discussed with urology on-call Dr. McDiarmid ?  ? ?Consultants: Outside urologist ?Procedures performed: Reinsertion of Foley catheter ?Disposition: Skilled nursing facility ?Diet recommendation:  ?Discharge Diet Orders (From admission, onward)  ? ?  Start     Ordered  ? 05/24/21 0000  Diet - low sodium heart healthy       ? 05/24/21 1101  ? ?  ?  ? ?  ? ?Cardiac diet ?DISCHARGE MEDICATION: ?Allergies as of 05/24/2021   ? ?   Reactions  ? Sulfa Antibiotics Hives  ? ?  ? ?  ?Medication List  ?  ? ?STOP taking these medications   ? ?amLODipine-benazepril 10-40 MG capsule ?Commonly known as: LOTREL ?  ?ezetimibe 10 MG tablet ?Commonly known as: ZETIA ?  ? ?  ? ?TAKE these medications   ? ?acetaminophen 650 MG CR tablet ?Commonly known as: TYLENOL ?Take 1,300 mg by mouth every 8 (eight) hours as needed for pain. ?  ?amantadine 100 MG capsule ?Commonly known as: SYMMETREL ?Take 100 mg by mouth See admin instructions. Take one capsule (100 mg) by mouth daily at bedtime - 1am ?  ?atorvastatin 10 MG tablet ?Commonly known as: LIPITOR ?TAKE 1/2 TABLET BY  MOUTH EVERY OTHER DAY AND ALTERNATING 1 TABLET BY MOUTH EVERY OTHER DAY. Please keep upcoming appt in April 2023 before anymore refills. Thank you ?What changed:  ?how much to take ?how to take this ?when to take this ?additional instructions ?  ?carbidopa-levodopa 25-100 MG tablet ?Commonly known as: SINEMET IR ?Take 3 tablets by mouth every 8 (eight) hours. 8am, 5pm and 1am ?  ?clonazePAM 0.5 MG tablet ?Commonly known as: KLONOPIN ?Take 0.25 mg by mouth 2 (two) times daily as needed for anxiety. ?  ?COLACE PO ?Take 2 tablets by mouth 2 (two) times daily as needed (constipation). ?  ?entacapone 200 MG tablet ?Commonly known as: COMTAN ?Take 200 mg by mouth every 8 (eight) hours. 9am, 5pm, 1am ?  ?esomeprazole 20 MG capsule ?Commonly known as: [place] ?Take 20 mg by mouth every other day. ?  ?metoprolol tartrate 50 MG tablet ?Commonly known as: LOPRESSOR ?Take 50 mg by mouth 2 (two) times daily. ?  ?polyethylene glycol 17 g packet ?Commonly known as: MIRALAX / GLYCOLAX ?Take 17 g by mouth daily. ?Start taking on: May 25, 2021 ?  ?senna-docusate 8.6-50 MG tablet ?Commonly known as: Senokot-S ?Take 1 tablet by mouth 2 (two) times daily. ?  ?tadalafil 5 MG tablet ?Commonly known as: CIALIS ?Take 5 mg by mouth every evening. ?  ?tolterodine 4 MG 24 hr capsule ?Commonly known  asMolinda Bailiff LA ?Take 4 mg by mouth every evening. ?  ?traMADol 50 MG tablet ?Commonly known as: ULTRAM ?Take 50 mg by mouth 3 (three) times daily as needed (pain). ?  ?Xarelto 20 MG Tabs tablet ?Generic drug: rivaroxaban ?TAKE 1 TABLET BY MOUTH EVERY EVENING WITH SUPPER ?What changed: See the new instructions. ?  ? ?  ? ? Follow-up Information   ? ? Mayra Neer, MD Follow up.   ?Specialty: Family Medicine ?Why: The office will patient. ?Contact information: ?[address] ?[address] ?[place] [address] ?[phone] ? ? ?  ?  ? ? Sueanne Margarita, MD .   ?Specialty: Cardiology ?Contact information: ?[address] ?[address] ?[place]  [address] ?[phone] ? ? ?  ?  ? ?  ?  ? ?  ? ?Discharge Exam: ?Filed Weights  ? 05/22/21 1427 05/23/21 0517 05/24/21 0418  ?Weight: 121.5 kg 119.2 kg 116.6 kg  ? ? ? ? ?Physical Exam: ?  ?General:  AAO x 3,  coopera

## 2021-05-24 NOTE — Progress Notes (Signed)
Physical Therapy Treatment ?Patient Details ?Name: Isaiah Pena ?MRN: [id number] ?DOB: [birth year] ?Today's Date: 05/24/2021 ? ? ?History of Present Illness "Isaiah Pena" is a 71 year old male presenting to [facility] ED 05/21/21 after 2-3 days of dizziness, weakness, and inability to void. Workup (+) for AKI and urinary retention. Imaging (+) for small L pleural effusion and R kidney cysts. PMH includse Parkinson's disease, L eye vision disruption, BPH, BLE edema, CAD s/p PCI to RCA, and HTN ? ?  ?PT Comments  ? ? The pt was able to make good progress with mobility this session, completing ~15 ft ambulation in the room + ~8 ft after seated rest. The pt did have HR increase to max 152bpm with mobility, mostly 130-150bpm while ambulating. No overt LOB, but pt with significant posterior lean, especially with sit-stand transfers and turning with RW. The pt's wife did assist with one sit-stand and states it is more assist than she is typically providing, the pt also needed significantly increased time and cues for postural adjustments and positioning.  ? ?The pt continues to present with significant deficits in mobility compared to functional baseline PTA (he was able to ambulate in the house and stand with minA from his wife). Therefore, continue to recommend acute inpatient rehab at d/c, if not available, will need max HHPT and HH aide to decrease caregiver burden and facilitate functional recovery, improved endurance and strength. Will need WC for mobility in the home and community mobility.  ?   ?Recommendations for follow up therapy are one component of a multi-disciplinary discharge planning process, led by the attending physician.  Recommendations may be updated based on patient status, additional functional criteria and insurance authorization. ? ?Follow Up Recommendations ? Acute inpatient rehab (3hours/day) (max HHPT and aide if being d/c home) ?  ?  ?Assistance Recommended at Discharge Frequent or constant  Supervision/Assistance  ?Patient can return home with the following A lot of help with walking and/or transfers;Assistance with cooking/housework;Direct supervision/assist for medications management;Direct supervision/assist for financial management;Assist for transportation;Help with stairs or ramp for entrance;A lot of help with bathing/dressing/bathroom ?  ?Equipment Recommendations ? BSC/3in1;Rolling walker (2 wheels);Wheelchair (measurements PT);Wheelchair cushion (measurements PT)  ?  ?Recommendations for Other Services   ? ? ?  ?Precautions / Restrictions Precautions ?Precautions: Fall ?Restrictions ?Weight Bearing Restrictions: No  ?  ? ?Mobility ? Bed Mobility ?Overal bed mobility: Needs Assistance ?Bed Mobility: Supine to Sit ?  ?  ?Supine to sit: Mod assist, HOB elevated, Max assist ?  ?  ?General bed mobility comments: increased time and cues, maxA to elelvate trunk, modA to BLE. discussed returning to bed technique with the pt and his wife. ?  ? ?Transfers ?Overall transfer level: Needs assistance ?Equipment used: Rolling walker (2 wheels) ?Transfers: Sit to/from Stand, Bed to chair/wheelchair/BSC ?Sit to Stand: Mod assist, +2 physical assistance, Min assist ?  ?Step pivot transfers: Min assist, +2 physical assistance ?  ?  ?  ?General transfer comment: patient's wife used lift aide to stand from EOB to prepare for home discharge. Patient able to push up to stand from 3n1 with minA. Pt needing assist due to posterior lean and need for sequential cues to wt shift forward and keep toes on groudn ?  ? ?Ambulation/Gait ?Ambulation/Gait assistance: +2 safety/equipment, Min assist ?Gait Distance (Feet): 15 Feet (+ 8 ft) ?Assistive device: Rolling walker (2 wheels) ?Gait Pattern/deviations: Step-to pattern, Decreased stride length, Shuffle, Festinating ?Gait velocity: decreased ?Gait velocity interpretation: <1.31 ft/sec, indicative of household ambulator ?  ?  General Gait Details: pt needing max cues for  increased step clearance to break festination, increased difficulty with turns ? ? ?  ?Balance Overall balance assessment: Needs assistance ?Sitting-balance support: Single extremity supported, Feet unsupported ?Sitting balance-Leahy Scale: Poor ?Sitting balance - Comments: min assist to min guard for sitting balance on EOB ?Postural control: Posterior lean ?Standing balance support: Bilateral upper extremity supported, Reliant on assistive device for balance, During functional activity ?Standing balance-Leahy Scale: Poor ?Standing balance comment: reliant on RW and external assist ?  ?  ?  ?  ?  ?  ?  ?  ?  ?  ?  ?  ? ?  ?Cognition Arousal/Alertness: Awake/alert ?Behavior During Therapy: Flat affect ?Overall Cognitive Status: Impaired/Different from baseline ?Area of Impairment: Following commands, Awareness, Problem solving ?  ?  ?  ?  ?  ?  ?  ?  ?  ?  ?  ?Following Commands: Follows one step commands consistently, Follows one step commands with increased time, Follows multi-step commands inconsistently ?  ?Awareness: Emergent ?Problem Solving: Slow processing, Decreased initiation, Difficulty sequencing, Requires verbal cues, Requires tactile cues ?General Comments: followed commands, required cues for safe walker use ?  ?  ? ?  ?   ?General Comments General comments (skin integrity, edema, etc.): SpO2 stable on RA. HR max 152 during activity ?  ?  ? ?Pertinent Vitals/Pain Pain Assessment ?Pain Assessment: Faces ?Faces Pain Scale: Hurts a little bit ?Pain Location: back (chronic pain) ?Pain Descriptors / Indicators: Discomfort ?Pain Intervention(s): Monitored during session, Repositioned  ? ? ? ?PT Goals (current goals can now be found in the care plan section) Acute Rehab PT Goals ?Patient Stated Goal: to return home ?PT Goal Formulation: With patient/family ?Time For Goal Achievement: 06/05/21 ?Potential to Achieve Goals: Good ?Progress towards PT goals: Progressing toward goals ? ?  ?Frequency ? ? ? Min  3X/week ? ? ? ?  ?PT Plan Current plan remains appropriate  ? ? ?Co-evaluation PT/OT/SLP Co-Evaluation/Treatment: Yes ?Reason for Co-Treatment: Necessary to address cognition/behavior during functional activity;For patient/therapist safety;To address functional/ADL transfers ?PT goals addressed during session: Mobility/safety with mobility;Balance;Proper use of DME;Strengthening/ROM ?OT goals addressed during session: ADL's and self-care ?  ? ?  ?AM-PAC PT "6 Clicks" Mobility   ?Outcome Measure ? Help needed turning from your back to your side while in a flat bed without using bedrails?: A Lot ?Help needed moving from lying on your back to sitting on the side of a flat bed without using bedrails?: A Lot ?Help needed moving to and from a bed to a chair (including a wheelchair)?: A Little ?Help needed standing up from a chair using your arms (e.g., wheelchair or bedside chair)?: A Lot ?Help needed to walk in hospital room?: Total ?Help needed climbing 3-5 steps with a railing? : Total ?6 Click Score: 11 ? ?  ?End of Session Equipment Utilized During Treatment: Gait belt ?Activity Tolerance: Patient tolerated treatment well ?Patient left: in chair;with call bell/phone within reach;with family/visitor present ?Nurse Communication: Mobility status ?PT Visit Diagnosis: Unsteadiness on feet (R26.81);Other abnormalities of gait and mobility (R26.89);Muscle weakness (generalized) (M62.81);Difficulty in walking, not elsewhere classified (R26.2) ?  ? ? ?Time: 1607-3710 ?PT Time Calculation (min) (ACUTE ONLY): 35 min ? ?Charges:  $Gait Training: 8-22 mins          ?          ? ?West Carbo, PT, DPT  ? ?Acute Rehabilitation Department ?Pager #: [phone] - [phone] ? ? ?  Sandra Cockayne ?05/24/2021, 10:41 AM ? ?

## 2021-05-24 NOTE — Progress Notes (Signed)
Family at bedside and requesting pt have intermittent cath. MD at pts. Bedside. Decision made to reinsert foley cath. New orders received, Foley catheter placed.  ?

## 2021-05-24 NOTE — Progress Notes (Signed)
Occupational Therapy Treatment ?Patient Details ?Name: Isaiah Pena ?MRN: [id number] ?DOB: July 04, 1950 ?Today's Date: 05/24/2021 ? ? ?History of present illness "Isaiah Pena" is a 71 year old male presenting to [facility] ED 05/21/21 after 2-3 days of dizziness, weakness, and inability to void. Workup (+) for AKI and urinary retention. Imaging (+) for small L pleural effusion and R kidney cysts. PMH includse Parkinson's disease, L eye vision disruption, BPH, BLE edema, CAD s/p PCI to RCA, and HTN ?  ?OT comments ? Patient received in supine and agreeable to OT/PT treatment. Patient was mod assist and increased time to get to EOB. Patient's wife used stand assist aide to assist with standing from EOB and patient was able to ambulated to door and to 3n1 with assist of 2 for safety. Patient unable to perform toilet hygiene and used a bidet at home. Patient completed treatment in recliner and education provided to patient and wife on recommendations after returning home. Patient would benefit from further OT services with [facility].   ? ?Recommendations for follow up therapy are one component of a multi-disciplinary discharge planning process, led by the attending physician.  Recommendations may be updated based on patient status, additional functional criteria and insurance authorization. ?   ?Follow Up Recommendations ? Home health OT  ?  ?Assistance Recommended at Discharge Frequent or constant Supervision/Assistance  ?Patient can return home with the following ? Two people to help with walking and/or transfers;Two people to help with bathing/dressing/bathroom;Assistance with cooking/housework;Direct supervision/assist for medications management;Direct supervision/assist for financial management;Assist for transportation;Help with stairs or ramp for entrance ?  ?Equipment Recommendations ? Wheelchair (measurements OT)  ?  ?Recommendations for Other Services   ? ?  ?Precautions / Restrictions Precautions ?Precautions:  Fall ?Restrictions ?Weight Bearing Restrictions: No  ? ? ?  ? ?Mobility Bed Mobility ?Overal bed mobility: Needs Assistance ?Bed Mobility: Supine to Sit ?  ?  ?Supine to sit: Mod assist, HOB elevated ?  ?  ?General bed mobility comments: increased time and verbal cues throughout ?  ? ?Transfers ?Overall transfer level: Needs assistance ?Equipment used: Rolling walker (2 wheels) ?Transfers: Sit to/from Stand, Bed to chair/wheelchair/BSC ?Sit to Stand: Mod assist, +2 physical assistance, Min assist ?  ?  ?Step pivot transfers: Min assist, +2 physical assistance ?  ?  ?General transfer comment: patient's wife used lift aide to stand from EOB to prepare for home discharge. Patient able to push up to stand from 3n1 ?  ?  ?Balance Overall balance assessment: Needs assistance ?Sitting-balance support: Single extremity supported, Feet unsupported ?Sitting balance-Leahy Scale: Poor ?Sitting balance - Comments: min assist to min guard for sitting balance on EOB ?Postural control: Posterior lean ?Standing balance support: Bilateral upper extremity supported, Reliant on assistive device for balance, During functional activity ?Standing balance-Leahy Scale: Poor ?Standing balance comment: reliant on RW and external assist ?  ?  ?  ?  ?  ?  ?  ?  ?  ?  ?  ?   ? ?ADL either performed or assessed with clinical judgement  ? ?ADL Overall ADL's : Needs assistance/impaired ?  ?  ?  ?  ?  ?  ?  ?  ?  ?  ?  ?  ?Toilet Transfer: Minimal assistance;+2 for physical assistance;Regular Toilet;BSC/3in1 ?Toilet Transfer Details (indicate cue type and reason): cues for safety ?Toileting- Clothing Manipulation and Hygiene: Maximal assistance;Sit to/from stand ?Toileting - Clothing Manipulation Details (indicate cue type and reason): assistance of one for balance and max assist  for hygiene ?  ?  ?  ?  ?  ? ?Extremity/Trunk Assessment   ?  ?  ?  ?  ?  ? ?Vision   ?  ?  ?Perception   ?  ?Praxis   ?  ? ?Cognition Arousal/Alertness:  Awake/alert ?Behavior During Therapy: Flat affect ?Overall Cognitive Status: Impaired/Different from baseline ?Area of Impairment: Following commands, Awareness, Problem solving ?  ?  ?  ?  ?  ?  ?  ?  ?  ?  ?  ?Following Commands: Follows one step commands consistently, Follows one step commands with increased time, Follows multi-step commands inconsistently ?  ?Awareness: Emergent ?Problem Solving: Slow processing, Decreased initiation, Difficulty sequencing, Requires verbal cues, Requires tactile cues ?General Comments: followed commands, required cues for safe walker use ?  ?  ?   ?Exercises   ? ?  ?Shoulder Instructions   ? ? ?  ?General Comments    ? ? ?Pertinent Vitals/ Pain       Pain Assessment ?Pain Assessment: Faces ?Faces Pain Scale: Hurts a little bit ?Pain Location: back (chronic pain) ?Pain Descriptors / Indicators: Discomfort ?Pain Intervention(s): Monitored during session ? ?Home Living   ?  ?  ?  ?  ?  ?  ?  ?  ?  ?  ?  ?  ?  ?  ?  ?  ?  ?  ? ?  ?Prior Functioning/Environment    ?  ?  ?  ?   ? ?Frequency ? Min 2X/week  ? ? ? ? ?  ?Progress Toward Goals ? ?OT Goals(current goals can now be found in the care plan section) ? Progress towards OT goals: Progressing toward goals ? ?Acute Rehab OT Goals ?Patient Stated Goal: get better ?OT Goal Formulation: With patient ?Time For Goal Achievement: 06/05/21 ?Potential to Achieve Goals: Good ?ADL Goals ?Pt Will Perform Grooming: with supervision;with set-up;sitting ?Pt Will Perform Upper Body Bathing: with set-up;with supervision;sitting ?Pt Will Transfer to Toilet: with mod assist;bedside commode;stand pivot transfer ?Pt Will Perform Toileting - Clothing Manipulation and hygiene: with max assist;sit to/from stand ?Additional ADL Goal #1: Pt will complete bed mobility with min assist as precursor to ADLs.  ?Plan Discharge plan remains appropriate   ? ?Co-evaluation ? ? ? PT/OT/SLP Co-Evaluation/Treatment: Yes ?Reason for Co-Treatment: For patient/therapist  safety;To address functional/ADL transfers ?  ?OT goals addressed during session: ADL's and self-care ?  ? ?  ?AM-PAC OT "6 Clicks" Daily Activity     ?Outcome Measure ? ? Help from another person eating meals?: A Lot ?Help from another person taking care of personal grooming?: A Little ?Help from another person toileting, which includes using toliet, bedpan, or urinal?: Total ?Help from another person bathing (including washing, rinsing, drying)?: A Lot ?Help from another person to put on and taking off regular upper body clothing?: A Lot ?Help from another person to put on and taking off regular lower body clothing?: Total ?6 Click Score: 11 ? ?  ?End of Session Equipment Utilized During Treatment: Gait belt;Rolling walker (2 wheels) ? ?OT Visit Diagnosis: Other abnormalities of gait and mobility (R26.89);Muscle weakness (generalized) (M62.81);Other symptoms and signs involving cognitive function ?  ?Activity Tolerance Patient tolerated treatment well ?  ?Patient Left in chair;with call bell/phone within reach;with family/visitor present ?  ?Nurse Communication Mobility status ?  ? ?   ? ?Time: 8841-6606 ?OT Time Calculation (min): 35 min ? ?Charges: OT General Charges ?$OT Visit: 1 Visit ?OT Treatments ?$Self  Care/Home Management : 8-22 mins ? ?Lodema Hong, OTA ?Acute Rehabilitation Services  ?Pager [phone] ?Office [phone] ? ? ?Trixie Dredge ?05/24/2021, 9:18 AM ?

## 2021-05-24 NOTE — TOC Transition Note (Addendum)
Transition of Care (TOC) - CM/SW Discharge Note ? ? ?Patient Details  ?Name: Isaiah Pena ?MRN: [id number] ?Date of Birth: 07-May-1950 ? ?Transition of Care (TOC) CM/SW Contact:  ?Zenon Mayo, RN ?Phone Number: ?05/24/2021, 11:56 AM ? ? ?Clinical Narrative:    ?Patient for dc to home with HH (max) services.  NCM offered choice to patient and wife, wife said to come back in 10 minutes she will have the choice,  she chose [place].  NCM made referral to [facility] with Alvis Lemmings ,, he is able to take referral.  Soc will begin 24 to 48 hrs post dc.  Wife will transport him home, she states she will call her son to help her to get him into the house.  He has a walker, rollaotr, pull assist device at home and his shower has a shower set.   ? ?Patient is now being evaluated by CIR to see if he will be an appropriate candidate for CIR.  This NCM informed the patient and the wife of this information and MD. TOC will continue to follow. ? ? ?Final next level of care: [place] ?Barriers to Discharge: No Barriers Identified ? ? ?Patient Goals and CMS Choice ?Patient states their goals for this hospitalization and ongoing recovery are:: return home with [facility] ?CMS Medicare.gov Compare Post Acute Care list provided to:: Patient Represenative (must comment) ?Choice offered to / list presented to : Spouse ? ?Discharge Placement ?  ?           ?  ?  ?  ?  ? ?Discharge Plan and Services ?  ?  ?           ?  ?DME Agency: NA ?  ?  ?  ?HH Arranged: RN, Disease Management, PT, OT, Nurse's Aide, Social Work ?[place] Agency: [facility] ?Date HH Agency Contacted: 05/24/21 ?Time [place]: [phone] ?Representative spoke with at [place]: Tommi Rumps ? ?Social Determinants of Health (SDOH) Interventions ?  ? ? ?Readmission Risk Interventions ?   ? View : No data to display.  ?  ?  ?  ? ? ? ? ? ?

## 2021-05-24 NOTE — Progress Notes (Signed)
Pt. HR back up to 140s, pt. Diaphoretic. Pt. C/o of lower abdominal pain. Bladder scan performed. 471 cc noted in bladder. On call for [facility] paged to make aware.  ?

## 2021-05-25 ENCOUNTER — Encounter (HOSPITAL_COMMUNITY): Payer: Self-pay

## 2021-05-25 ENCOUNTER — Inpatient Hospital Stay: Admission: RE | Admit: 2021-05-25 | Payer: Medicare Other | Source: Home / Self Care

## 2021-05-25 ENCOUNTER — Inpatient Hospital Stay (HOSPITAL_COMMUNITY)
Admission: RE | Admit: 2021-05-25 | Discharge: 2021-06-05 | DRG: 945 | Disposition: A | Discharge status: Home Health | Payer: Medicare Other | Source: Intra-hospital | Attending: Physical Medicine & Rehabilitation | Admitting: Physical Medicine & Rehabilitation

## 2021-05-25 ENCOUNTER — Other Ambulatory Visit: Payer: Self-pay

## 2021-05-25 DIAGNOSIS — Z6838 Body mass index (BMI) 38.0-38.9, adult: Secondary | ICD-10-CM

## 2021-05-25 DIAGNOSIS — R5381 Other malaise: Principal | ICD-10-CM | POA: Diagnosis present

## 2021-05-25 DIAGNOSIS — R338 Other retention of urine: Secondary | ICD-10-CM | POA: Diagnosis present

## 2021-05-25 DIAGNOSIS — N320 Bladder-neck obstruction: Secondary | ICD-10-CM | POA: Diagnosis present

## 2021-05-25 DIAGNOSIS — E669 Obesity, unspecified: Secondary | ICD-10-CM | POA: Diagnosis not present

## 2021-05-25 DIAGNOSIS — K5901 Slow transit constipation: Secondary | ICD-10-CM | POA: Diagnosis present

## 2021-05-25 DIAGNOSIS — I10 Essential (primary) hypertension: Secondary | ICD-10-CM | POA: Diagnosis present

## 2021-05-25 DIAGNOSIS — G20 Parkinson's disease: Secondary | ICD-10-CM | POA: Diagnosis present

## 2021-05-25 DIAGNOSIS — Z741 Need for assistance with personal care: Secondary | ICD-10-CM | POA: Diagnosis present

## 2021-05-25 DIAGNOSIS — Z87442 Personal history of urinary calculi: Secondary | ICD-10-CM | POA: Diagnosis not present

## 2021-05-25 DIAGNOSIS — I2510 Atherosclerotic heart disease of native coronary artery without angina pectoris: Secondary | ICD-10-CM | POA: Diagnosis present

## 2021-05-25 DIAGNOSIS — N529 Male erectile dysfunction, unspecified: Secondary | ICD-10-CM | POA: Diagnosis present

## 2021-05-25 DIAGNOSIS — N179 Acute kidney failure, unspecified: Secondary | ICD-10-CM

## 2021-05-25 DIAGNOSIS — R339 Retention of urine, unspecified: Secondary | ICD-10-CM | POA: Diagnosis not present

## 2021-05-25 DIAGNOSIS — G4733 Obstructive sleep apnea (adult) (pediatric): Secondary | ICD-10-CM | POA: Diagnosis present

## 2021-05-25 DIAGNOSIS — I4819 Other persistent atrial fibrillation: Secondary | ICD-10-CM | POA: Diagnosis not present

## 2021-05-25 DIAGNOSIS — E876 Hypokalemia: Secondary | ICD-10-CM | POA: Diagnosis present

## 2021-05-25 DIAGNOSIS — I4821 Permanent atrial fibrillation: Secondary | ICD-10-CM | POA: Diagnosis present

## 2021-05-25 DIAGNOSIS — Z66 Do not resuscitate: Secondary | ICD-10-CM | POA: Diagnosis present

## 2021-05-25 DIAGNOSIS — Z882 Allergy status to sulfonamides status: Secondary | ICD-10-CM

## 2021-05-25 DIAGNOSIS — Z20822 Contact with and (suspected) exposure to covid-19: Secondary | ICD-10-CM | POA: Diagnosis not present

## 2021-05-25 DIAGNOSIS — I4891 Unspecified atrial fibrillation: Secondary | ICD-10-CM | POA: Diagnosis not present

## 2021-05-25 DIAGNOSIS — N2889 Other specified disorders of kidney and ureter: Secondary | ICD-10-CM | POA: Diagnosis present

## 2021-05-25 DIAGNOSIS — Z955 Presence of coronary angioplasty implant and graft: Secondary | ICD-10-CM

## 2021-05-25 DIAGNOSIS — N401 Enlarged prostate with lower urinary tract symptoms: Secondary | ICD-10-CM | POA: Diagnosis present

## 2021-05-25 DIAGNOSIS — K5900 Constipation, unspecified: Secondary | ICD-10-CM | POA: Diagnosis not present

## 2021-05-25 DIAGNOSIS — M549 Dorsalgia, unspecified: Secondary | ICD-10-CM | POA: Diagnosis present

## 2021-05-25 DIAGNOSIS — F419 Anxiety disorder, unspecified: Secondary | ICD-10-CM | POA: Diagnosis present

## 2021-05-25 DIAGNOSIS — N400 Enlarged prostate without lower urinary tract symptoms: Secondary | ICD-10-CM | POA: Diagnosis present

## 2021-05-25 DIAGNOSIS — E785 Hyperlipidemia, unspecified: Secondary | ICD-10-CM | POA: Diagnosis present

## 2021-05-25 DIAGNOSIS — Z86010 Personal history of colonic polyps: Secondary | ICD-10-CM

## 2021-05-25 DIAGNOSIS — K219 Gastro-esophageal reflux disease without esophagitis: Secondary | ICD-10-CM | POA: Diagnosis present

## 2021-05-25 DIAGNOSIS — G20A1 Parkinson's disease without dyskinesia, without mention of fluctuations: Secondary | ICD-10-CM

## 2021-05-25 DIAGNOSIS — Z79899 Other long term (current) drug therapy: Secondary | ICD-10-CM

## 2021-05-25 DIAGNOSIS — Z7901 Long term (current) use of anticoagulants: Secondary | ICD-10-CM

## 2021-05-25 LAB — BASIC METABOLIC PANEL
Anion gap: 10 (ref 5–15)
BUN: 17 mg/dL (ref 8–23)
CO2: 26 mmol/L (ref 22–32)
Calcium: 8.3 mg/dL — ABNORMAL LOW (ref 8.9–10.3)
Chloride: 106 mmol/L (ref 98–111)
Creatinine, Ser: 0.71 mg/dL (ref 0.61–1.24)
GFR, Estimated: >60 mL/min (ref >60)
Glucose, Bld: 123 mg/dL — ABNORMAL HIGH (ref 70–99)
Potassium: 3.4 mmol/L — ABNORMAL LOW (ref 3.5–5.1)
Sodium: 142 mmol/L (ref 135–145)

## 2021-05-25 LAB — MAGNESIUM: Magnesium: 1.5 mg/dL — ABNORMAL LOW (ref 1.7–2.4)

## 2021-05-25 MED ORDER — FINASTERIDE 5 MG PO TABS
5.0000 mg | ORAL_TABLET | Freq: Every day | ORAL | Status: DC
  Administered 2021-05-26 – 2021-06-05 (×11): 5 mg via ORAL
  Filled 2021-05-25 (×11): qty 1

## 2021-05-25 MED ORDER — AMLODIPINE BESYLATE 10 MG PO TABS
10.0000 mg | ORAL_TABLET | Freq: Every day | ORAL | Status: DC
  Administered 2021-05-25: 10 mg via ORAL
  Filled 2021-05-25: qty 1

## 2021-05-25 MED ORDER — POTASSIUM CHLORIDE CRYS ER 20 MEQ PO TBCR
40.0000 meq | EXTENDED_RELEASE_TABLET | Freq: Once | ORAL | Status: AC
  Administered 2021-05-25: 40 meq via ORAL
  Filled 2021-05-25: qty 2

## 2021-05-25 MED ORDER — ATORVASTATIN CALCIUM 10 MG PO TABS
5.0000 mg | ORAL_TABLET | ORAL | Status: DC
  Administered 2021-05-27 – 2021-06-04 (×5): 5 mg via ORAL
  Filled 2021-05-25 (×5): qty 1

## 2021-05-25 MED ORDER — GUAIFENESIN-DM 100-10 MG/5ML PO SYRP: 5.0000 mL | ORAL_SOLUTION | Freq: Four times a day (QID) | ORAL | Status: DC | PRN

## 2021-05-25 MED ORDER — SORBITOL 70 % SOLN
30.0000 mL | Freq: Every day | Status: DC | PRN
  Filled 2021-05-25: qty 30

## 2021-05-25 MED ORDER — POLYETHYLENE GLYCOL 3350 17 G PO PACK
17.0000 g | PACK | Freq: Every day | ORAL | Status: DC
  Administered 2021-05-26 – 2021-06-05 (×9): 17 g via ORAL
  Filled 2021-05-25 (×11): qty 1

## 2021-05-25 MED ORDER — AMLODIPINE BESYLATE 10 MG PO TABS
10.0000 mg | ORAL_TABLET | Freq: Every day | ORAL | Status: DC
  Administered 2021-05-26 – 2021-06-05 (×11): 10 mg via ORAL
  Filled 2021-05-25 (×11): qty 1

## 2021-05-25 MED ORDER — SENNOSIDES-DOCUSATE SODIUM 8.6-50 MG PO TABS
1.0000 | ORAL_TABLET | Freq: Two times a day (BID) | ORAL | Status: DC
  Administered 2021-05-25 – 2021-06-05 (×22): 1 via ORAL
  Filled 2021-05-25 (×22): qty 1

## 2021-05-25 MED ORDER — ACETAMINOPHEN 650 MG RE SUPP: 650.0000 mg | Freq: Four times a day (QID) | RECTAL | Status: DC | PRN

## 2021-05-25 MED ORDER — TADALAFIL 5 MG PO TABS
5.0000 mg | ORAL_TABLET | Freq: Every evening | ORAL | Status: DC
  Administered 2021-05-25 – 2021-06-04 (×11): 5 mg via ORAL
  Filled 2021-05-25 (×12): qty 1

## 2021-05-25 MED ORDER — CARBIDOPA-LEVODOPA 25-100 MG PO TABS
3.0000 | ORAL_TABLET | Freq: Three times a day (TID) | ORAL | Status: DC
  Administered 2021-05-25 – 2021-06-05 (×33): 3 via ORAL
  Filled 2021-05-25 (×34): qty 3

## 2021-05-25 MED ORDER — ONDANSETRON HCL 4 MG PO TABS
4.0000 mg | ORAL_TABLET | Freq: Four times a day (QID) | ORAL | Status: DC | PRN
  Administered 2021-05-28: 4 mg via ORAL
  Filled 2021-05-25: qty 1

## 2021-05-25 MED ORDER — MAGNESIUM SULFATE 4 GM/100ML IV SOLN
4.0000 g | Freq: Once | INTRAVENOUS | Status: DC
  Administered 2021-05-25: 4 g via INTRAVENOUS
  Filled 2021-05-25: qty 100

## 2021-05-25 MED ORDER — RIVAROXABAN 20 MG PO TABS
20.0000 mg | ORAL_TABLET | Freq: Every day | ORAL | Status: DC
Start: 2021-05-25 — End: 2021-06-05
  Administered 2021-05-25 – 2021-06-04 (×11): 20 mg via ORAL
  Filled 2021-05-25 (×11): qty 1

## 2021-05-25 MED ORDER — CLONAZEPAM 0.25 MG PO TBDP
0.2500 mg | ORAL_TABLET | Freq: Two times a day (BID) | ORAL | Status: DC | PRN
  Administered 2021-06-02 – 2021-06-05 (×2): 0.25 mg via ORAL
  Filled 2021-05-25 (×2): qty 1

## 2021-05-25 MED ORDER — EZETIMIBE 10 MG PO TABS
10.0000 mg | ORAL_TABLET | Freq: Every evening | ORAL | Status: DC
  Administered 2021-05-25 – 2021-06-04 (×11): 10 mg via ORAL
  Filled 2021-05-25 (×12): qty 1

## 2021-05-25 MED ORDER — FESOTERODINE FUMARATE ER 4 MG PO TB24
4.0000 mg | ORAL_TABLET | Freq: Every day | ORAL | Status: DC
Start: 2021-05-25 — End: 2021-05-25
  Administered 2021-05-25: 4 mg via ORAL
  Filled 2021-05-25: qty 1

## 2021-05-25 MED ORDER — BISACODYL 10 MG RE SUPP: 10.0000 mg | Freq: Every day | RECTAL | Status: DC | PRN

## 2021-05-25 MED ORDER — ONDANSETRON HCL 4 MG/2ML IJ SOLN: 4.0000 mg | Freq: Four times a day (QID) | INTRAMUSCULAR | Status: DC | PRN

## 2021-05-25 MED ORDER — ENTACAPONE 200 MG PO TABS
200.0000 mg | ORAL_TABLET | Freq: Three times a day (TID) | ORAL | Status: DC
  Administered 2021-05-25 – 2021-06-05 (×33): 200 mg via ORAL
  Filled 2021-05-25 (×34): qty 1

## 2021-05-25 MED ORDER — ACETAMINOPHEN 325 MG PO TABS
650.0000 mg | ORAL_TABLET | Freq: Four times a day (QID) | ORAL | Status: DC | PRN
  Administered 2021-05-25 – 2021-06-04 (×5): 650 mg via ORAL
  Filled 2021-05-25 (×5): qty 2

## 2021-05-25 MED ORDER — SENNOSIDES-DOCUSATE SODIUM 8.6-50 MG PO TABS: 1.0000 | ORAL_TABLET | Freq: Every evening | ORAL | Status: DC | PRN

## 2021-05-25 MED ORDER — MELATONIN 3 MG PO TABS
3.0000 mg | ORAL_TABLET | Freq: Every evening | ORAL | Status: DC | PRN
  Administered 2021-05-25 – 2021-05-28 (×2): 3 mg via ORAL
  Filled 2021-05-25 (×3): qty 1

## 2021-05-25 MED ORDER — TRAMADOL HCL 50 MG PO TABS
50.0000 mg | ORAL_TABLET | Freq: Three times a day (TID) | ORAL | Status: DC | PRN
  Administered 2021-05-26 – 2021-06-04 (×10): 50 mg via ORAL
  Filled 2021-05-25 (×11): qty 1

## 2021-05-25 MED ORDER — AMANTADINE HCL 100 MG PO CAPS
100.0000 mg | ORAL_CAPSULE | Freq: Every day | ORAL | Status: DC
  Administered 2021-05-26 – 2021-06-05 (×11): 100 mg via ORAL
  Filled 2021-05-25 (×11): qty 1

## 2021-05-25 MED ORDER — PANTOPRAZOLE SODIUM 40 MG PO TBEC
40.0000 mg | DELAYED_RELEASE_TABLET | Freq: Every day | ORAL | Status: DC
  Administered 2021-05-26 – 2021-06-05 (×11): 40 mg via ORAL
  Filled 2021-05-25 (×11): qty 1

## 2021-05-25 MED ORDER — ATORVASTATIN CALCIUM 10 MG PO TABS
10.0000 mg | ORAL_TABLET | ORAL | Status: DC
Start: 2021-05-26 — End: 2021-06-05
  Administered 2021-05-26 – 2021-06-05 (×6): 10 mg via ORAL
  Filled 2021-05-25 (×6): qty 1

## 2021-05-25 MED ORDER — METOPROLOL TARTRATE 50 MG PO TABS
50.0000 mg | ORAL_TABLET | Freq: Two times a day (BID) | ORAL | Status: DC
  Administered 2021-05-25 – 2021-06-05 (×22): 50 mg via ORAL
  Filled 2021-05-25 (×22): qty 1

## 2021-05-25 MED ORDER — MAGNESIUM GLUCONATE 500 MG PO TABS
500.0000 mg | ORAL_TABLET | Freq: Two times a day (BID) | ORAL | Status: DC
  Administered 2021-05-25 – 2021-06-05 (×22): 500 mg via ORAL
  Filled 2021-05-25 (×22): qty 1

## 2021-05-25 MED ORDER — FESOTERODINE FUMARATE ER 4 MG PO TB24
4.0000 mg | ORAL_TABLET | Freq: Every day | ORAL | Status: DC
  Administered 2021-05-26 – 2021-06-05 (×11): 4 mg via ORAL
  Filled 2021-05-25 (×11): qty 1

## 2021-05-25 NOTE — Discharge Summary (Signed)
?[facility] ? ?Physician Discharge Summary  ? ?Patient ID: ?Isaiah Pena ?MRN: [id number] ?DOB/AGE: [birth year] 71 y.o. ? ?Admit date: 05/21/2021 ?Discharge date:   05/25/2021 ? ? ?PCP: Mayra Neer, MD ? ?DISCHARGE DIAGNOSES:  ?Principal Problem: ?  AKI with acute urinary retention  ?Active Problems: ?  Right renal mass ?  Constipation ?  Leukocytosis ?  Persistent atrial fibrillation ([place]) ?  Debility ?  Parkinson disease ([place]) ?  Hypertension ?  Coronary artery disease s/p PCI to RCA  ?  Hyperlipidemia ?  GERD (gastroesophageal reflux disease) ?  OSA (obstructive sleep apnea) ?  BPH (benign prostatic hyperplasia) ? ? ?PATIENT BEING DISCHARGED TO [facility] ? ?RECOMMENDATIONS FOR OUTPATIENT FOLLOW UP: ?Please check basic metabolic panel and magnesium in the next 1 to 2 days ?Patient will need follow-up with Dr. Diona Fanti after discharge from rehab. ?Please leave Foley catheter in for now ? ? ?Home Health: Going to inpatient rehabilitation ?Equipment/Devices: None ? ?CODE STATUS: DNR ? ?DISCHARGE CONDITION: fair ? ?Diet recommendation: Heart healthy ? ?INITIAL HISTORY: ?Isaiah Pena is a 71/M with history of Parkinson's disease, paroxysmal A-fib on  xarelto, CAD s/p PCI to RCA, HTN, HLD, OSA on bipap, BPH, PD who presented to ED with complaints of dizziness and weakness x 3-4 days. He also has had urinary retention and constipation.  He was found to have acute kidney injury on blood work.  Bladder distention was noted on CT scan.  He was thought to have bladder outlet obstruction from prostate gland enlargement.  Mild bilateral hydronephrosis was noted.  Patient failed voiding trial.  Foley catheter had to be reinserted.  Plan will be for him to be discharged with Foley catheter. ?-BPH noted on imaging, suspect symptoms likely secondary to this, could have neurogenic bladder issues related to Parkinson's disease as well ?-Followed by Dr. Diona Fanti, has failed alpha blockers and  other meds in the past, currently on Myrbetrix, tadalafil and tolterodine  ?-Now with Foley catheter, this was discussed with urology on-call Dr. McDiarmid, recommended Foley catheter at discharge and close follow-up with urology in 1 to 2 weeks for definitive management and assessment of right renal mass. ? ? ? ?HOSPITAL COURSE:  ? ?* AKI with acute urinary retention  ?-AKI likely to obstructive pathology from acute urinary retention in setting of PD ?Foley catheter was placed in the ED.  Subsequently discontinued for voiding trial which he failed.  Foley catheter had to be reinserted. ?Case was discussed with the urologist who recommends discharging the patient with Foley catheter and follow-up with Dr. Diona Fanti in 2 weeks time. ?Patient noted to be on finasteride.  Patient is supposed to be on tolterodine as well.  Not listed in his current medication list. ? ?Right renal mass ?CT finding of complex peripherally calcified mass in lower pole of right kidney, incompletely characterized without contrast.  ?Followed by MRI with contrast of abdomen pelvis, consistent with cystic lesion ? ?Constipation ?Had a bowel movement this morning.  Continue bowel regimen.   ? ?Leukocytosis ?No signs of infection, likely reactive.  Now improved. ? ?Debility ?PT/OT evaluation, recommending CIR versus SNF ?-Recommending aggressive PT/OT, fall precautions ?-The patient is wife living alone at home with no assist ?-Agreeable to SNF ?TOC assisting with placement ? ?Persistent atrial fibrillation ([place]) ?Continue xarelto. Continue lopressor BID.  Monitor heart rate. ?Replace potassium.  Magnesium was 1.5 a few days ago.  Will recheck. ? ?Parkinson disease ([place]) ?Continue home comtan, sinemet IR and symmetrel ?PT  consult -recommended CIR versus SNF ?Delirium precautions ? ?Hypertension ?Reasonably well-controlled.  Noted to be on just metoprolol for now.  Prior to admission he was on amlodipine-benazepril.  Resume  amlodipine. ? ?Coronary artery disease s/p PCI to RCA  ?Continue medical management with lopressor, lipitor and xarelto  ? ?Hyperlipidemia ?Continue lipitor  ? ?GERD (gastroesophageal reflux disease) ?Continue PPI  ? ?OSA (obstructive sleep apnea) ?Does not wear cpap/bipap at night  ? ?BPH (benign prostatic hyperplasia) ?See above.  Outpatient follow-up with urology. ? ? ?Obesity ?Estimated body mass index is 38.85 kg/m? as calculated from the following: ?  Height as of this encounter: '5\' 8"'$  (1.727 m). ?  Weight as of this encounter: 115.9 kg. ? ?Patient is stable.  Okay for discharge to inpatient rehabilitation when bed is available. ? ? ?PERTINENT LABS: ? ?The results of significant diagnostics from this hospitalization (including imaging, microbiology, ancillary and laboratory) are listed below for reference.   ? ?Microbiology: ?Recent Results (from the past 240 hour(s))  ?Urine Culture     Status: None  ? Collection Time: 05/21/21  4:48 PM  ? Specimen: Urine, Clean Catch  ?Result Value Ref Range Status  ? Specimen Description URINE, CLEAN CATCH  Final  ? Special Requests NONE  Final  ? Culture   Final  ?  NO GROWTH ?Performed at [facility], [place] [address] [place], [place] [address] ?  ? Report Status 05/23/2021 FINAL  Final  ?  ? ?Labs: ? ?COVID-19 Labs ? ?Lab Results  ?Component Value Date  ? [place] NEGATIVE 07/08/2019  ? ? ? ? ?Basic Metabolic Panel: ?Recent Labs  ?Lab 05/21/21 ?1203 05/22/21 ?0150 05/22/21 ?0213 05/23/21 ?4196 05/23/21 ?0240 05/24/21 ?2229 05/25/21 ?0200  ?NA 140  --  143  --  146* 143 142  ?K 3.7  --  3.1*  --  3.1* 3.2* 3.4*  ?CL 106  --  111  --  109 107 106  ?CO2 25  --  25  --  '30 29 26  '$ ?GLUCOSE 129*  --  130*  --  121* 134* 123*  ?BUN 57*  --  47*  --  '21 12 17  '$ ?CREATININE 4.70*  --  2.59*  --  1.07 0.73 0.71  ?CALCIUM 8.7*  --  8.3*  --  8.3* 8.6* 8.3*  ?MG  --  2.0  --  1.5*  --   --   --   ?PHOS  --  3.7  --   --   --   --   --   ? ?Liver Function Tests: ?Recent Labs   ?Lab 05/21/21 ?1203  ?AST 15  ?ALT <5  ?ALKPHOS 70  ?BILITOT 1.5*  ?PROT 6.2*  ?ALBUMIN 3.5  ? ? ?CBC: ?Recent Labs  ?Lab 05/21/21 ?1203 05/22/21 ?0213 05/23/21 ?0240  ?WBC 12.3* 8.5 7.6  ?NEUTROABS 10.5*  --   --   ?HGB 15.3 14.3 13.7  ?HCT 45.4 40.8 41.1  ?MCV 98.3 97.4 97.9  ?PLT 135* 143* 141*  ? ?BNP: ?BNP (last 3 results) ?Recent Labs  ?  05/21/21 ?1203  ?BNP 98.0  ? ? ? ?IMAGING STUDIES ?CT ABDOMEN PELVIS WO CONTRAST ? ?Result Date: 05/21/2021 ?CLINICAL DATA:  Abdominal pain, acute, nonlocalized with new acute kidney injury and urinary retention. Dizziness with weakness for 3-4 days. EXAM: CT ABDOMEN AND PELVIS WITHOUT CONTRAST TECHNIQUE: Multidetector CT imaging of the abdomen and pelvis was performed following the standard protocol without IV contrast. RADIATION DOSE REDUCTION: This  exam was performed according to the departmental dose-optimization program which includes automated exposure control, adjustment of the mA and/or kV according to patient size and/or use of iterative reconstruction technique. COMPARISON:  Abdominal radiographs 05/21/2021. Report only from remote abdominal CT 07/07/2000-unavailable for direct comparison. FINDINGS: Lower chest: Mild linear atelectasis or scarring at both lung bases. No significant pleural or pericardial effusion. Atherosclerosis of the aorta and coronary arteries. Hepatobiliary: No focal hepatic abnormalities are identified on noncontrast imaging. There are small dependent calcified gallstones. No significant gallbladder distension, wall thickening or surrounding inflammation. No evidence of biliary dilatation. Pancreas: Unremarkable. No pancreatic ductal dilatation or surrounding inflammatory changes. Spleen: Normal in size without focal abnormality. Adrenals/Urinary Tract: Both adrenal glands appear normal. Renal assessment limited by the lack of intravenous contrast. Both kidneys demonstrate mild ureteropelvocaliectasis and perinephric soft tissue stranding. No  obstructing ureteral calculi are demonstrated. However, there is a peripherally calcified mass medially within the lower pole of the right kidney, measuring 4.0 x 1.8 cm on image 49/3. Inferior to this mass, there is a noncalci

## 2021-05-25 NOTE — Progress Notes (Signed)
Gave report to 4W, pt will be shortly transferred.  ?

## 2021-05-25 NOTE — Progress Notes (Signed)
Inpatient Rehab Admissions: ? ?Inpatient Rehab Consult received.  I met with pt and son [name] at the bedside for rehabilitation assessment and to discuss goals and expectations of an inpatient rehab admission.  Both acknowledged understanding of CIR goals and expectations. Both interested in pt pursuing CIR. Pt gave permission to contact wife. Spoke with wife on the phone. She acknowledged understanding of CIR goals and expectations. She is supportive of pt pursuing CIR. Will continue to follow. ? ?Signed: ?Gayland Curry, MS, CCC-SLP ?Admissions Coordinator ?[phone] ? ? ?

## 2021-05-25 NOTE — Progress Notes (Signed)
Occupational Therapy Treatment ?Patient Details ?Name: Isaiah Pena ?MRN: [id number] ?DOB: October 02, 1950 ?Today's Date: 05/25/2021 ? ? ?History of present illness "Isaiah Pena" is a 71 year old male presenting to [facility] ED 05/21/21 after 2-3 days of dizziness, weakness, and inability to void. Workup (+) for AKI and urinary retention. Imaging (+) for small L pleural effusion and R kidney cysts. PMH includse Parkinson's disease, L eye vision disruption, BPH, BLE edema, CAD s/p PCI to RCA, and HTN ?  ?OT comments ? Pt. Seen for skilled OT session.  CNA present and able to provide 2 person assist when needed.  Making progress with goals.  Utilized stedy for transfers but pt. Able to assist with grooming and ub bathing today.  Wife present and very active and encouraging with pt.s participation.  Agree with current d/c recommendations.    ? ?Recommendations for follow up therapy are one component of a multi-disciplinary discharge planning process, led by the attending physician.  Recommendations may be updated based on patient status, additional functional criteria and insurance authorization. ?   ?Follow Up Recommendations ? Home health OT  ?  ?Assistance Recommended at Discharge Frequent or constant Supervision/Assistance  ?Patient can return home with the following ? Two people to help with walking and/or transfers;Two people to help with bathing/dressing/bathroom;Assistance with cooking/housework;Direct supervision/assist for medications management;Direct supervision/assist for financial management;Assist for transportation;Help with stairs or ramp for entrance ?  ?Equipment Recommendations ? Wheelchair (measurements OT)  ?  ?Recommendations for Other Services Rehab consult ? ?  ?Precautions / Restrictions Precautions ?Precautions: Fall ?Restrictions ?Weight Bearing Restrictions: No  ? ? ?  ? ?Mobility Bed Mobility ?Overal bed mobility: Needs Assistance ?Bed Mobility: Supine to Sit ?  ?  ?Supine to sit: Mod assist, HOB elevated, Max  assist ?Sit to supine: +2 for physical assistance, +2 for safety/equipment, Max assist ?  ?General bed mobility comments: increased time and cues, maxA to elelvate trunk, modA to BLE. discussed returning to bed technique with the pt and his wife. when seated eob and attempts to doff shirt he had significant lob post. with physical assistance required to correct ?  ? ?Transfers ?Overall transfer level: Needs assistance ?Equipment used: None (stedy) ?Transfers: Sit to/from Stand, Bed to chair/wheelchair/BSC ?  ?  ?  ?  ?  ?  ?  ?Transfer via Lift Equipment: Stedy ?  ?Balance   ?  ?  ?  ?  ?  ?  ?  ?  ?  ?  ?  ?  ?  ?  ?  ?  ?  ?  ?   ? ?ADL either performed or assessed with clinical judgement  ? ?ADL Overall ADL's : Needs assistance/impaired ?  ?  ?Grooming: Wash/dry hands;Wash/dry face;Sitting;Set up;Applying deodorant ?  ?Upper Body Bathing: Minimal assistance;Sitting;Cueing for sequencing ?Upper Body Bathing Details (indicate cue type and reason): able to wash arm pits, chest, stomach, face ?  ?  ?  ?  ?  ?  ?Toilet Transfer: +2 for safety/equipment ?Toilet Transfer Details (indicate cue type and reason): stedy for transfer to/from bed/bsc ?Toileting- Clothing Manipulation and Hygiene: Total assistance ?Toileting - Clothing Manipulation Details (indicate cue type and reason): cna performed care while i assisted pt. in the stedy ?  ?  ?Functional mobility during ADLs: Minimal assistance;Maximal assistance;+2 for physical assistance;+2 for safety/equipment ?General ADL Comments: wife present educated on allowing pt. to attempt tasks prior to jumping in to assist. she was pleased with how much he was able to  assist with bathing and grooming tasks. ?  ? ?Extremity/Trunk Assessment   ?  ?  ?  ?  ?  ? ?Vision   ?  ?  ?Perception   ?  ?Praxis   ?  ? ?Cognition Arousal/Alertness: Awake/alert ?Behavior During Therapy: [facility] for tasks assessed/performed ?Overall Cognitive Status: Within Functional Limits for tasks assessed ?   ?  ?  ?  ?  ?  ?  ?  ?  ?  ?  ?  ?  ?  ?  ?  ?  ?  ?  ?   ?Exercises   ? ?  ?Shoulder Instructions   ? ? ?  ?General Comments  Pt. Is a retired Academic librarian.  Loves jazz and blues music.  Played with a lot of different musicians.  Became very emotional when he talked about music.  Began to cry.  It is a part of his life that he really enjoyed.    ? ? ?Pertinent Vitals/ Pain       Pain Assessment ?Pain Assessment: No/denies pain ? ?Home Living   ?  ?  ?  ?  ?  ?  ?  ?  ?  ?  ?  ?  ?  ?  ?  ?  ?  ?  ? ?  ?Prior Functioning/Environment    ?  ?  ?  ?   ? ?Frequency ? Min 2X/week  ? ? ? ? ?  ?Progress Toward Goals ? ?OT Goals(current goals can now be found in the care plan section) ? Progress towards OT goals: Progressing toward goals ? ?   ?Plan Discharge plan remains appropriate   ? ?Co-evaluation ? ? ?   ?  ?  ?  ?  ? ?  ?AM-PAC OT "6 Clicks" Daily Activity     ?Outcome Measure ? ? Help from another person eating meals?: A Lot ?Help from another person taking care of personal grooming?: A Little ?Help from another person toileting, which includes using toliet, bedpan, or urinal?: Total ?Help from another person bathing (including washing, rinsing, drying)?: A Lot ?Help from another person to put on and taking off regular upper body clothing?: A Lot ?Help from another person to put on and taking off regular lower body clothing?: Total ?6 Click Score: 11 ? ?  ?End of Session Equipment Utilized During Treatment: Other (comment) (stedy and 3n1) ? ?OT Visit Diagnosis: Other abnormalities of gait and mobility (R26.89);Muscle weakness (generalized) (M62.81);Other symptoms and signs involving cognitive function ?  ?Activity Tolerance Patient tolerated treatment well ?  ?Patient Left in bed;with call bell/phone within reach;with family/visitor present ?  ?Nurse Communication   ?  ? ?   ? ?Time: 2549-8264 ?OT Time Calculation (min): 29 min ? ?Charges: OT General Charges ?$OT Visit: 1 Visit ?OT Treatments ?$Self Care/Home  Management : 23-37 mins ? ?Sonia Baller, COTA/L ?Acute Rehabilitation ?[phone]  ? ?Tanya Nones ?05/25/2021, 11:14 AM ?

## 2021-05-25 NOTE — H&P (Signed)
?  ?Physical Medicine and Rehabilitation Admission H&P ?  ?  ?   ?Chief Complaint  ?Patient presents with  ? Atrial Fibrillation  ? Weakness  ? Dizziness  ?: ?HPI: This is a 71 year old patient with Parkinson disease and paroxysmal atrial fibrillation on Xarelto as well as BPH.  He presented to the emergency room on 05/21/2021 with a 3 to 4-day history of dizziness and weakness as well as urinary retention.  He is also having constipation.  Lab work-up revealed acute kidney injury and his bladder was distended on CT of the abdomen and pelvis.  He was thought to have bladder outlet obstruction from prostate gland enlargement.  Mild bilateral hydronephrosis was seen.  Foley catheter was ultimately inserted as patient was unable to pass urine.  Is also felt that his Parkinson disease contributed to his bladder issues.  The patient has been followed by Dr. Diona Fanti and has failed other medications in the past.  Patient demonstrated ongoing difficulties with mobility and self-care given the medical issues listed above.  He was assessed by the therapy and inpatient rehab team's and is felt that he could benefit from an inpatient rehab stay to improve his functional ability and self-care. ?  ?Review of Systems  ?Constitutional:  Positive for malaise/fatigue. Negative for chills.  ?HENT:  Negative for ear discharge and ear pain.   ?Eyes:  Negative for double vision and photophobia.  ?Respiratory:  Negative for cough.   ?Cardiovascular:  Positive for palpitations. Negative for chest pain.  ?Gastrointestinal:  Negative for nausea and vomiting.  ?Genitourinary:  Positive for dysuria.  ?Musculoskeletal:  Positive for back pain, falls, joint pain and myalgias.  ?Skin:  Negative for itching.  ?Neurological:  Positive for dizziness, sensory change and weakness.  ?Psychiatric/Behavioral:  Negative for depression and suicidal ideas.   ?    ?Past Medical History:  ?Diagnosis Date  ? Anticoagulant long-term use    ?  xarelto  ?  Bilateral lower extremity edema    ? BPH (benign prostatic hyperplasia)    ? BPH (benign prostatic hyperplasia)    ? Central serous retinopathy    ?  LOSS OF CENTER VISION LEFT EYE W/ BLURRED VISION-  was treated  methotrexate by specialist at [facility] and released  ? Coronary artery disease cardiologist-  dr Tressia Miners turner  ?  01/ 2004  abnormal cardiolite  s/p  cardiac cath w/ PCI and DES to dRCA  ? Edema of extremities    ?  CHRONIC   ? Erectile dysfunction    ? GERD (gastroesophageal reflux disease)    ? History of colon polyps    ?  2011 per pt benign  ? History of kidney stones    ? History of simple renal cyst    ?  drained via radiology  ? History of torn meniscus of right knee    ? Hyperlipidemia    ? Hypertension    ? Kidney stone    ?  HX OF STONE-PASSED   ? Lower urinary tract symptoms (LUTS)    ? OSA (obstructive sleep apnea) 09/29/2011  ?  NPSG 2004:  AHI 25/hr  Rx with bilevel.     ? OSA treated with BiPAP    ?  moderate per study 01-23-2002  ? Parkinson's disease ([place])    ?  neurologist-  dr ed hill ([facility])  ? Permanent atrial fibrillation ([place])    ? Persistent atrial fibrillation ([place]) 11/08/2012  ? S/P drug eluting coronary stent  placement    ?  01/ 2004  x1 to dRCA  ?  ?     ?Past Surgical History:  ?Procedure Laterality Date  ? CARDIOVASCULAR STRESS TEST   05-07-2015   dr Tressia Miners turner  ?  normal nuclear study w/ no ischemia/  normal LV function and wall motion , stress ef 55%  ? COLONOSCOPY WITH PROPOFOL   2011  ? CORONARY ANGIOPLASTY WITH STENT PLACEMENT   02-11-2002  dr Daneen Schick  ?  abnormal cardiolite--  PCI and DES to dRCA (99%),  pRCA luminal irregularities, mild to moderate LAD and CFX disease,  normal LVF  ? CYSTOSCOPY WITH INSERTION OF UROLIFT N/A 01/28/2016  ?  Procedure: CYSTOSCOPY WITH INSERTION OF UROLIFT;  Surgeon: Franchot Gallo, MD;  Location: [facility];  Service: Urology;  Laterality: N/A;  ? HERNIA REPAIR      ?  AGE 9  ? KNEE ARTHROSCOPY   03/21/2011  ?   Procedure: ARTHROSCOPY KNEE;  Surgeon: Tobi Bastos, MD;  Location: [facility];  Service: Orthopedics;  Laterality: Right;  ?  ?No family history on file. ?Social History:  reports that he has never smoked. He has never used smokeless tobacco. He reports that he does not drink alcohol and does not use drugs. ?Allergies:  ?    ?Allergies  ?Allergen Reactions  ? Sulfa Antibiotics Hives  ?  ?      ?Medications Prior to Admission  ?Medication Sig Dispense Refill  ? acetaminophen (TYLENOL) 650 MG CR tablet Take 1,300 mg by mouth every 8 (eight) hours as needed for pain.      ? amantadine (SYMMETREL) 100 MG capsule Take 100 mg by mouth See admin instructions. Take one capsule (100 mg) by mouth daily at bedtime - 1am      ? amLODipine-benazepril (LOTREL) 10-40 MG per capsule TAKE CAPSULE CAPSULE BY MOUTH ONCE DAILY (Patient taking differently: Take 1 capsule by mouth every morning.) 7 capsule 0  ? atorvastatin (LIPITOR) 10 MG tablet TAKE 1/2 TABLET BY MOUTH EVERY OTHER DAY AND ALTERNATING 1 TABLET BY MOUTH EVERY OTHER DAY. Please keep upcoming appt in April 2023 before anymore refills. Thank you (Patient taking differently: Take 5-10 mg by mouth See admin instructions. Take 1/2 tablet (5 mg) by mouth every other evening, alternate with 1 tablet (10 mg) every other evening. Please keep upcoming appt in April 2023 before anymore refills. Thank you) 90 tablet 1  ? carbidopa-levodopa (SINEMET IR) 25-100 MG tablet Take 3 tablets by mouth every 8 (eight) hours. 8am, 5pm and 1am      ? clonazePAM (KLONOPIN) 0.5 MG tablet Take 0.25 mg by mouth 2 (two) times daily as needed for anxiety.      ? Docusate Sodium (COLACE PO) Take 2 tablets by mouth 2 (two) times daily as needed (constipation).      ? entacapone (COMTAN) 200 MG tablet Take 200 mg by mouth every 8 (eight) hours. 9am, 5pm, 1am   0  ? esomeprazole (NEXIUM) 20 MG capsule Take 20 mg by mouth every other day.      ? ezetimibe (ZETIA) 10 MG tablet Take 10 mg by mouth every  evening.      ? metoprolol (LOPRESSOR) 50 MG tablet Take 50 mg by mouth 2 (two) times daily.      ? rivaroxaban (XARELTO) 20 MG TABS tablet TAKE 1 TABLET BY MOUTH EVERY EVENING WITH SUPPER (Patient taking differently: 20 mg See admin instructions. Take one tablet (20  mg) by mouth daily at 1am) 30 tablet 4  ? tadalafil (CIALIS) 5 MG tablet Take 5 mg by mouth every evening.      ? tolterodine (DETROL LA) 4 MG 24 hr capsule Take 4 mg by mouth every evening.      ? traMADol (ULTRAM) 50 MG tablet Take 50 mg by mouth 3 (three) times daily as needed (pain).      ?  ?  ?  ?  ?Home: ?Home Living ?Family/patient expects to be discharged to:: Private residence ?Living Arrangements: Spouse/significant other ?Available Help at Discharge: Family, Available 24 hours/day ?Type of Home: House ?Home Access: Stairs to enter ?Entrance Stairs-Number of Steps: 2 steps in front; 4 steps in back ?Entrance Stairs-Rails: Left ?Home Layout: One level ?Bathroom Shower/Tub: Walk-in shower ?Bathroom Toilet: Handicapped height ?Bathroom Accessibility: Yes ?Home Equipment: Rollator (4 wheels), Cane - single point, Shower seat ?Additional Comments: very supportive family/community ? Lives With: Spouse ?  ?Functional History: ?Prior Function ?Prior Level of Function : Needs assist ?Physical Assist : Mobility (physical), ADLs (physical) ?Mobility (physical): Bed mobility, Stairs, Transfers ?ADLs (physical): Bathing, Toileting, Dressing, IADLs ?Mobility Comments: wife, Adonis Brook, helps him with bed mobility and to stand up out of bed. Pt able to ambulate around house without assist, however, does require assist on stairs for safety. ?ADLs Comments: pt needs appox 50% assist for ADLs including dressing, bathing ?  ?Functional Status:  ?Mobility: ?Bed Mobility ?Overal bed mobility: Needs Assistance ?Bed Mobility: Supine to Sit ?Supine to sit: Mod assist, HOB elevated, Max assist ?Sit to supine: +2 for physical assistance, +2 for safety/equipment, Max  assist ?General bed mobility comments: increased time and cues, maxA to elelvate trunk, modA to BLE. discussed returning to bed technique with the pt and his wife. when seated eob and attempts to doff shirt he h

## 2021-05-25 NOTE — Progress Notes (Signed)
?   05/25/21 0956  ?Assess: MEWS Score  ?BP (!) 129/99  ?Pulse Rate 100  ?ECG Heart Rate (!) 105  ?Resp 18  ?Level of Consciousness Alert  ?SpO2 100 %  ?O2 Device Room Air  ?Assess: MEWS Score  ?MEWS Temp 0  ?MEWS Systolic 0  ?MEWS Pulse 1  ?MEWS RR 0  ?MEWS LOC 0  ?MEWS Score 1  ?MEWS Score Color Green  ?Assess: if the MEWS score is Yellow or Red  ?Were vital signs taken at a resting state? Yes  ?Focused Assessment No change from prior assessment  ?Early Detection of Sepsis Score *See Row Information* Low  ?MEWS guidelines implemented *See Row Information* No, vital signs rechecked  ? ? ?

## 2021-05-25 NOTE — Progress Notes (Addendum)
Inpatient Rehabilitation Admission Medication Review by a Pharmacist ? ?A complete drug regimen review was completed for this patient to identify any potential clinically significant medication issues. ? ?High Risk Drug Classes Is patient taking? Indication by Medication  ?Antipsychotic No   ?Anticoagulant Yes Xarelto - atrial fibrillation  ?Antibiotic No   ?Opioid Yes Tramadol prn - pain  ?Antiplatelet No   ?Hypoglycemics/insulin No   ?Vasoactive Medication Yes Amlodipine, Metoprolol - HTN, and HR  ?Chemotherapy No   ?Other Yes Amantadine, Sinemet IR, Comtan - Parkinson's ?Atorvastatin, Zetia - HLD ?Toviaz ER (therapeutic interchange for Detrol LA), Tadalafil, Finasteride - BPH ?Protonix - GERD ?Miralax, Senokot S - laxatives ?Clonazepam prn - anxiety  ? ? ? ?Type of Medication Issue Identified Description of Issue Recommendation(s)  ?Drug Interaction(s) (clinically significant) ?    ?Duplicate Therapy ?    ?Allergy ?    ?No Medication Administration End Date ?    ?Incorrect Dose ?    ?Additional Drug Therapy Needed ?    ?Significant med changes from prior encounter (inform family/care partners about these prior to discharge). Previously taking Amlodipine-Benazepril combination. Benazepril held due to AKI, resolved. ?- Amlodipine resumed 5/6 am with BP elevated. ? ?Detrol LA > Toviaz ER per [facility] therapeutic interchange. ? ?Finasteride (Proscar) added 05/23/21. ?Miralax and Senokot S new during inpatient admit. Continued on current inpatient PTA meds.   ?Benazepril discontinued.  ?Other ?    ? ? ?Clinically significant medication issues were identified that warrant physician communication and completion of prescribed/recommended actions by midnight of the next day:  No ? ?Pharmacist comments:  ?DC summary does not indicate plan to continue: ? - Proscar, but just added 5/4 and continued ? - Amlodipine, but resumed 5/6 am due to BP elevated. ? - Zetia, but on PTA and continued. No recent lipid panel. ? ?Time spent  performing this drug regimen review (minutes):  30 ? ?Arty Baumgartner, RPh ?05/25/2021 4:08 PM ?

## 2021-05-25 NOTE — Progress Notes (Signed)
Signed    ? ?   ?   ?   ?   ?   ?   ?   ?   ?   ?   ?   ?   ?   ?   ?   ?   ?   ?   ?   ?   ?   ?   ?   ?   ?   ?   ?   ?   ?   ?   ?   ?   ?   ?   ?   ?   ?   ?   ?   ?   ?   ?   ?   ?   ?   ?   ?   ?   ?   ?   ?   ?   ?   ?   ?   ?   ?   ?   ?   ?   ?   ?   ?   ?   ?   ?   ?   ?   ?   ?   ?   ?   ?   ?   ?   ?   ?   ?   ?   ?   ?   ?   ?   ?   ?   ?   ?   ?   ?   ?   ?   ?   ?   ?   ?   ?   ?   ?   ?   ?   ?   ?   ?   ?   ?   ?   ?   ?   ?   ?   ?   ?   ?   ?   ?   ?   ?   ?   ?   ?   ?   ?   ?   ?   ?   ?   ?   ?   ?   ?   ?   ?   ?   ?   ?   ?   ?   ?   ?   ?   ?   ?   ?   ?   ?   ?   ?   ?   ?   ?   ?   ?   ?   ?PMR Admission Coordinator Pre-Admission Assessment ?  ?Patient: Isaiah Pena is an 71 y.o., male ?MRN: [id number] ?DOB: 03/24/1950 ?Height: '5\' 8"'$  (172.7 cm) ?Weight: 115.9 kg ?  ?Insurance Information ?HMO:     PPO:      PCP:      IPA:      80/20: yes     OTHER:  ?PRIMARY: Medicare A & B      Policy#: [id number]      Subscriber: patient ?CM Name:       Phone#:      Fax#:  ?Pre-Cert#:       Employer:  ?Benefits:  Phone #: verified eligibility via [place] on 05/25/21     Name:  ?Eff. Date: Part A & B effective 01/21/16     Deduct: $  1,600      Out of Pocket Max: NA      Life Max: NA ?CIR: 100% coverage      SNF: 100% coverage days 1-20, 80% coverage days 21-100 ?Outpatient: 80% coverage    Co-Pay: 20% ?Home Health: 100% coverage    Co-Pay:  ?DME: 80% coverage     Co-Pay: 20% ?Providers: pt's choice ?SECONDARY: Generic Commercial    ?Policy#: [id number]     Phone#: [phone] ?  ?Financial Counselor:       Phone#:  ?  ?The ?Data Collection Information Summary? for patients in Inpatient Rehabilitation Facilities with attached ?Privacy Act [place] Records? was provided and verbally reviewed with: Patient ?  ?Emergency Contact Information ?Contact Information   ?  ?  Name Relation Home Work Mobile  ?  JACKSON,KRISTY Significant other [phone]   [phone]  ?  ?   ?  ?  ?Current Medical  History  ?Patient Admitting Diagnosis: debility s/p AKI with urinary retention ?History of Present Illness: Pt is a 71 year old male with medical hx significant for: HTN, OSA on BiPAP, Parkinson's Disease, CAD s/p PCI to RCA, paroxysmal A-fib, HLD, BPH. Pt presented to [facility] on 05/21/21 d/t c/o increased weakness, fatigue and constipation x3 days. Labs showed evidence of AKI, CBC with mild leukocytosis. Chest x-ray showed cardiomegaly and mild pleural effusion. Abdominal x-ray did not show evidence of obstruction although moderate stool burden noted. CT abdomen showed nonspecific bladder distension and perivesical soft tissue stranding without apparent bladder wall thickening or focal bladder lesion. Foley catheter placed; over 1361m drained out. Calcified mass in lower pole of right kidney also noted. Therapy evaluations completed and CIR recommended d/t pt's deficits in functional mobility and inability to complete ADLs independently.  ?  ?[facility] admission coordinator and physician. ?  ?Past Medical History  ?    ?Past Medical History:  ?Diagnosis Date  ? Anticoagulant long-term use    ?  xarelto  ? Bilateral lower extremity edema    ? BPH (benign prostatic hyperplasia)    ? BPH (benign prostatic hyperplasia)    ? Central serous retinopathy    ?  LOSS OF CENTER VISION LEFT EYE W/ BLURRED VISION-  was treated  methotrexate by specialist at [facility] and released  ? Coronary artery disease cardiologist-  dr tTressia Minersturner  ?  01/ 2004  abnormal cardiolite  s/p  cardiac cath w/ PCI and DES to dRCA  ? Edema of extremities    ?  CHRONIC   ? Erectile dysfunction    ? GERD (gastroesophageal reflux disease)    ? History of colon polyps    ?  2011 per pt benign  ? History of kidney stones    ? History of simple renal cyst    ?  drained via radiology  ? History of torn meniscus of right knee    ? Hyperlipidemia    ? Hypertension    ?  Kidney stone    ?  HX OF STONE-PASSED   ? Lower urinary tract symptoms (LUTS)    ? OSA (obstructive sleep apnea) 09/29/2011  ?  NPSG 2004:  AHI 25/hr  Rx with bilevel.     ? OSA treated with BiPAP    ?  moderate per study 01-23-2002  ? [place]'s [place]    ?  [facility]  ? Permanent atrial fibrillation (  [place])    ? Persistent atrial fibrillation ([place]) 11/08/2012  ? S/P drug eluting coronary stent placement    ?  01/ 2004  x1 to dRCA  ?  ?  ?Has the patient had major surgery during 100 days prior to admission? No ?  ?Family History   ?family history is not on file. ?  ?Current Medications ?  ?Current Facility-Administered Medications:  ?  acetaminophen (TYLENOL) tablet 650 mg, 650 mg, Oral, Q6H PRN, 650 mg at 05/24/21 0938 **OR** acetaminophen (TYLENOL) suppository 650 mg, 650 mg, Rectal, Q6H PRN, Orma Flaming, MD ?  amantadine (SYMMETREL) capsule 100 mg, 100 mg, Oral, Daily, Orma Flaming, MD, 100 mg at 05/25/21 0144 ?  amLODipine (NORVASC) tablet 10 mg, 10 mg, Oral, Daily, Bonnielee Haff, MD, 10 mg at 05/25/21 1158 ?  atorvastatin (LIPITOR) tablet 10 mg, 10 mg, Oral, QODAY, 10 mg at 05/24/21 1717 **AND** atorvastatin (LIPITOR) tablet 5 mg, 5 mg, Oral, Auburn Bilberry, MD, 5 mg at 05/25/21 1093 ?  bisacodyl (DULCOLAX) suppository 10 mg, 10 mg, Rectal, Once, Domenic Polite, MD ?  carbidopa-levodopa (SINEMET IR) 25-100 MG per tablet immediate release 3 tablet, 3 tablet, Oral, Q8H, Orma Flaming, MD, 3 tablet at 05/25/21 2355 ?  Chlorhexidine Gluconate Cloth 2 % PADS 6 each, 6 each, Topical, Daily, Domenic Polite, MD, 6 each at 05/25/21 0957 ?  clonazePAM (KLONOPIN) disintegrating tablet 0.25 mg, 0.25 mg, Oral, BID PRN, Orma Flaming, MD, 0.25 mg at 05/22/21 7322 ?  entacapone (COMTAN) tablet 200 mg, 200 mg, Oral, Q8H, Orma Flaming, MD, 200 mg at 05/25/21 0254 ?  ezetimibe (ZETIA) tablet 10 mg, 10 mg, Oral, QPM, Orma Flaming, MD, 10 mg at 05/24/21 1712 ?   fesoterodine (TOVIAZ) tablet 4 mg, 4 mg, Oral, Daily, Bonnielee Haff, MD, 4 mg at 05/25/21 1158 ?  finasteride (PROSCAR) tablet 5 mg, 5 mg, Oral, Daily, Shahmehdi, Seyed A, MD, 5 mg at 05/25/21 0957 ?  LORazepam (ATIVAN) injection 1 mg, 1 mg, Intravenous, Once, Shahmehdi, Seyed A, MD ?  metoprolol tartrate (LOPRESSOR) injection 5 mg, 5 mg, Intravenous, Once, Shalhoub, Sherryll Burger, MD ?  metoprolol tartrate (LOPRESSOR) tablet 50 mg, 50 mg, Oral, BID, Orma Flaming, MD, 50 mg at 05/25/21 0957 ?  ondansetron (ZOFRAN) tablet 4 mg, 4 mg, Oral, Q6H PRN **OR** ondansetron (ZOFRAN) injection 4 mg, 4 mg, Intravenous, Q6H PRN, Orma Flaming, MD ?  pantoprazole (PROTONIX) EC tablet 40 mg, 40 mg, Oral, Daily, Orma Flaming, MD, 40 mg at 05/25/21 0957 ?  polyethylene glycol (MIRALAX / GLYCOLAX) packet 17 g, 17 g, Oral, Daily, Orma Flaming, MD, 17 g at 05/24/21 0932 ?  rivaroxaban (XARELTO) tablet 20 mg, 20 mg, Oral, Q supper, Shahmehdi, Seyed A, MD, 20 mg at 05/24/21 1712 ?  senna-docusate (Senokot-S) tablet 1 tablet, 1 tablet, Oral, BID, Domenic Polite, MD, 1 tablet at 05/25/21 2706 ?  tadalafil (CIALIS) tablet 5 mg, 5 mg, Oral, QPM, Orma Flaming, MD, 5 mg at 05/24/21 1712 ?  traMADol (ULTRAM) tablet 50 mg, 50 mg, Oral, TID PRN, Orma Flaming, MD, 50 mg at 05/25/21 1018 ?  ?Patients Current Diet:  ?Diet Order   ?  ?         ?    Diet - low sodium heart healthy       ?  ?    Diet regular Room service appropriate? Yes; Fluid consistency: Thin  Diet effective now       ?  ?  ?   ?  ?  ?   ?  ?  ?  Precautions / Restrictions ?Precautions ?Precautions: Fall ?Restrictions ?Weight Bearing Restrictions: No  ?  ?Has the patient had 2 or more falls or a fall with injury in the past year? Yes ?  ?Prior Activity Level ?Limited Community (1-2x/wk): gets out of house ~2 days/week ?  ?Prior Functional Level ?Self Care: Did the patient need help bathing, dressing, using the toilet or eating? Needed some help ?  ?Indoor Mobility: Did the  patient need assistance with walking from room to room (with or without device)? Independent ?  ?Stairs: Did the patient need assistance with internal or external stairs (with or without device)? Needed some help ?

## 2021-05-25 NOTE — Progress Notes (Signed)
Inpatient Rehab Admissions Coordinator:  ?There is a bed available for pt in CIR today. Dr. Maryland Pink aware and in agreement. Pt, wife [name], son [name], [place] and TOC made aware. ? ? ?Gayland Curry, MS, CCC-SLP ?Admissions Coordinator ?[phone] ? ?

## 2021-05-25 NOTE — Assessment & Plan Note (Signed)
See above.  Outpatient follow-up with urology. ?

## 2021-05-25 NOTE — H&P (Signed)
? ? ?Physical Medicine and Rehabilitation Admission H&P ? ?  ?Chief Complaint  ?Patient presents with  ? Atrial Fibrillation  ? Weakness  ? Dizziness  ?: ?HPI: This is a 71 year old patient with Parkinson disease and paroxysmal atrial fibrillation on Xarelto as well as BPH.  He presented to the emergency room on 05/21/2021 with a 3 to 4-day history of dizziness and weakness as well as urinary retention.  He is also having constipation.  Lab work-up revealed acute kidney injury and his bladder was distended on CT of the abdomen and pelvis.  He was thought to have bladder outlet obstruction from prostate gland enlargement.  Mild bilateral hydronephrosis was seen.  Foley catheter was ultimately inserted as patient was unable to pass urine.  Is also felt that his Parkinson disease contributed to his bladder issues.  The patient has been followed by Dr. Diona Fanti and has failed other medications in the past.  Patient demonstrated ongoing difficulties with mobility and self-care given the medical issues listed above.  He was assessed by the therapy and inpatient rehab team's and is felt that he could benefit from an inpatient rehab stay to improve his functional ability and self-care. ? ?Review of Systems  ?Constitutional:  Positive for malaise/fatigue. Negative for chills.  ?HENT:  Negative for ear discharge and ear pain.   ?Eyes:  Negative for double vision and photophobia.  ?Respiratory:  Negative for cough.   ?Cardiovascular:  Positive for palpitations. Negative for chest pain.  ?Gastrointestinal:  Negative for nausea and vomiting.  ?Genitourinary:  Positive for dysuria.  ?Musculoskeletal:  Positive for back pain, falls, joint pain and myalgias.  ?Skin:  Negative for itching.  ?Neurological:  Positive for dizziness, sensory change and weakness.  ?Psychiatric/Behavioral:  Negative for depression and suicidal ideas.   ?Past Medical History:  ?Diagnosis Date  ? Anticoagulant long-term use   ? xarelto  ? Bilateral lower  extremity edema   ? BPH (benign prostatic hyperplasia)   ? BPH (benign prostatic hyperplasia)   ? Central serous retinopathy   ? LOSS OF CENTER VISION LEFT EYE W/ BLURRED VISION-  was treated  methotrexate by specialist at [facility] and released  ? Coronary artery disease cardiologist-  dr Tressia Miners turner  ? 01/ 2004  abnormal cardiolite  s/p  cardiac cath w/ PCI and DES to dRCA  ? Edema of extremities   ? CHRONIC   ? Erectile dysfunction   ? GERD (gastroesophageal reflux disease)   ? History of colon polyps   ? 2011 per pt benign  ? History of kidney stones   ? History of simple renal cyst   ? drained via radiology  ? History of torn meniscus of right knee   ? Hyperlipidemia   ? Hypertension   ? Kidney stone   ? HX OF STONE-PASSED   ? Lower urinary tract symptoms (LUTS)   ? OSA (obstructive sleep apnea) 09/29/2011  ? NPSG 2004:  AHI 25/hr  Rx with bilevel.     ? OSA treated with BiPAP   ? moderate per study 01-23-2002  ? Parkinson's disease ([place])   ? neurologist-  dr ed hill ([facility])  ? Permanent atrial fibrillation ([place])   ? Persistent atrial fibrillation ([place]) 11/08/2012  ? S/P drug eluting coronary stent placement   ? 01/ 2004  x1 to dRCA  ? ?Past Surgical History:  ?Procedure Laterality Date  ? CARDIOVASCULAR STRESS TEST  05-07-2015   dr Tressia Miners turner  ? normal nuclear study w/ no  ischemia/  normal LV function and wall motion , stress ef 55%  ? COLONOSCOPY WITH PROPOFOL  2011  ? CORONARY ANGIOPLASTY WITH STENT PLACEMENT  02-11-2002  dr Daneen Schick  ? abnormal cardiolite--  PCI and DES to dRCA (99%),  pRCA luminal irregularities, mild to moderate LAD and CFX disease,  normal LVF  ? CYSTOSCOPY WITH INSERTION OF UROLIFT N/A 01/28/2016  ? Procedure: CYSTOSCOPY WITH INSERTION OF UROLIFT;  Surgeon: Franchot Gallo, MD;  Location: [facility];  Service: Urology;  Laterality: N/A;  ? HERNIA REPAIR    ? AGE 84  ? KNEE ARTHROSCOPY  03/21/2011  ? Procedure: ARTHROSCOPY KNEE;  Surgeon: Tobi Bastos, MD;   Location: [facility];  Service: Orthopedics;  Laterality: Right;  ? ?No family history on file. ?Social History:  reports that he has never smoked. He has never used smokeless tobacco. He reports that he does not drink alcohol and does not use drugs. ?Allergies:  ?Allergies  ?Allergen Reactions  ? Sulfa Antibiotics Hives  ? ?Medications Prior to Admission  ?Medication Sig Dispense Refill  ? acetaminophen (TYLENOL) 650 MG CR tablet Take 1,300 mg by mouth every 8 (eight) hours as needed for pain.    ? amantadine (SYMMETREL) 100 MG capsule Take 100 mg by mouth See admin instructions. Take one capsule (100 mg) by mouth daily at bedtime - 1am    ? amLODipine-benazepril (LOTREL) 10-40 MG per capsule TAKE CAPSULE CAPSULE BY MOUTH ONCE DAILY (Patient taking differently: Take 1 capsule by mouth every morning.) 7 capsule 0  ? atorvastatin (LIPITOR) 10 MG tablet TAKE 1/2 TABLET BY MOUTH EVERY OTHER DAY AND ALTERNATING 1 TABLET BY MOUTH EVERY OTHER DAY. Please keep upcoming appt in April 2023 before anymore refills. Thank you (Patient taking differently: Take 5-10 mg by mouth See admin instructions. Take 1/2 tablet (5 mg) by mouth every other evening, alternate with 1 tablet (10 mg) every other evening. Please keep upcoming appt in April 2023 before anymore refills. Thank you) 90 tablet 1  ? carbidopa-levodopa (SINEMET IR) 25-100 MG tablet Take 3 tablets by mouth every 8 (eight) hours. 8am, 5pm and 1am    ? clonazePAM (KLONOPIN) 0.5 MG tablet Take 0.25 mg by mouth 2 (two) times daily as needed for anxiety.    ? Docusate Sodium (COLACE PO) Take 2 tablets by mouth 2 (two) times daily as needed (constipation).    ? entacapone (COMTAN) 200 MG tablet Take 200 mg by mouth every 8 (eight) hours. 9am, 5pm, 1am  0  ? esomeprazole (NEXIUM) 20 MG capsule Take 20 mg by mouth every other day.    ? ezetimibe (ZETIA) 10 MG tablet Take 10 mg by mouth every evening.    ? metoprolol (LOPRESSOR) 50 MG tablet Take 50 mg by mouth 2 (two) times daily.     ? rivaroxaban (XARELTO) 20 MG TABS tablet TAKE 1 TABLET BY MOUTH EVERY EVENING WITH SUPPER (Patient taking differently: 20 mg See admin instructions. Take one tablet (20 mg) by mouth daily at 1am) 30 tablet 4  ? tadalafil (CIALIS) 5 MG tablet Take 5 mg by mouth every evening.    ? tolterodine (DETROL LA) 4 MG 24 hr capsule Take 4 mg by mouth every evening.    ? traMADol (ULTRAM) 50 MG tablet Take 50 mg by mouth 3 (three) times daily as needed (pain).    ? ? ? ? ?Home: ?Home Living ?Family/patient expects to be discharged to:: Private residence ?Living Arrangements: Spouse/significant other ?Available  Help at Discharge: Family, Available 24 hours/day ?Type of Home: House ?Home Access: Stairs to enter ?Entrance Stairs-Number of Steps: 2 steps in front; 4 steps in back ?Entrance Stairs-Rails: Left ?Home Layout: One level ?Bathroom Shower/Tub: Walk-in shower ?Bathroom Toilet: Handicapped height ?Bathroom Accessibility: Yes ?Home Equipment: Rollator (4 wheels), Cane - single point, Shower seat ?Additional Comments: very supportive family/community ? Lives With: Spouse ?  ?Functional History: ?Prior Function ?Prior Level of Function : Needs assist ?Physical Assist : Mobility (physical), ADLs (physical) ?Mobility (physical): Bed mobility, Stairs, Transfers ?ADLs (physical): Bathing, Toileting, Dressing, IADLs ?Mobility Comments: wife, Adonis Brook, helps him with bed mobility and to stand up out of bed. Pt able to ambulate around house without assist, however, does require assist on stairs for safety. ?ADLs Comments: pt needs appox 50% assist for ADLs including dressing, bathing ? ?Functional Status:  ?Mobility: ?Bed Mobility ?Overal bed mobility: Needs Assistance ?Bed Mobility: Supine to Sit ?Supine to sit: Mod assist, HOB elevated, Max assist ?Sit to supine: +2 for physical assistance, +2 for safety/equipment, Max assist ?General bed mobility comments: increased time and cues, maxA to elelvate trunk, modA to BLE. discussed  returning to bed technique with the pt and his wife. when seated eob and attempts to doff shirt he had significant lob post. with physical assistance required to correct ?Transfers ?Overall transfer lev

## 2021-05-25 NOTE — Progress Notes (Addendum)
? ?[facility] ?PROGRESS NOTE ? ? ?Isaiah Pena NAT:[id number] DOB: 1950/07/27 DOA: 05/21/2021  1 ?DOS: the patient was seen and examined on 05/25/2021 ? ?PCP: Mayra Neer, MD ? ?Brief History and Hospital Course:  ?Isaiah Pena is a 71/M with history of Parkinson's disease, paroxysmal A-fib on  xarelto, CAD s/p PCI to RCA, HTN, HLD, OSA on bipap, BPH, PD who presented to ED with complaints of dizziness and weakness x 3-4 days. He also has had urinary retention and constipation.  He was found to have acute kidney injury on blood work.  Bladder distention was noted on CT scan.  He was thought to have bladder outlet obstruction from prostate gland enlargement.  Mild bilateral hydronephrosis was noted.  Patient failed voiding trial.  Foley catheter had to be reinserted.  Plan will be for him to be discharged with Foley catheter. ?-BPH noted on imaging, suspect symptoms likely secondary to this, could have neurogenic bladder issues related to Parkinson's disease as well ?-Followed by Dr. Diona Fanti, has failed alpha blockers and other meds in the past, currently on Myrbetrix, tadalafil and tolterodine  ?-Now with Foley catheter, this was discussed with urology on-call Dr. McDiarmid, recommended Foley catheter at discharge and close follow-up with urology in 1 to 2 weeks for definitive management and assessment of right renal mass. ? ?Consultants: Phone discussion with urology by previous rounder ? ?Procedures: None ? ? ? ?Subjective: ?Patient denies any chest pain.  Slightly short of breath this morning after he was cleaned up.  Denies any dizziness or lightheadedness.  No nausea vomiting.  His significant other and son at the bedside ? ? ? ?Assessment/Plan: ? ? ?* AKI with acute urinary retention  ?-AKI likely to obstructive pathology from acute urinary retention in setting of PD ?Foley catheter was placed in the ED.  Subsequently discontinued for voiding trial which he failed.  Foley catheter had to be  reinserted. ?Case was discussed with the urologist who recommends discharging the patient with Foley catheter and follow-up with Dr. Diona Fanti in 2 weeks time. ?Patient noted to be on finasteride.  Patient is supposed to be on tolterodine as well.  Not listed in his current medication list. ? ?Right renal mass ?CT finding of complex peripherally calcified mass in lower pole of right kidney, incompletely characterized without contrast.  ?Followed by MRI with contrast of abdomen pelvis, consistent with cystic lesion ? ?Constipation ?Had a bowel movement this morning.  Continue bowel regimen.   ? ?Leukocytosis ?No signs of infection, likely reactive.  Now improved. ? ?Debility ?PT/OT evaluation, recommending CIR versus SNF ?-Recommending aggressive PT/OT, fall precautions ?-The patient is wife living alone at home with no assist ?-Agreeable to SNF ?TOC assisting with placement ? ?Persistent atrial fibrillation ([place]) ?Continue xarelto. Continue lopressor BID.  Monitor heart rate. ?Replace potassium.  Magnesium was 1.5 a few days ago.  Will recheck. ? ?Parkinson disease ([place]) ?Continue home comtan, sinemet IR and symmetrel ?PT consult -recommended CIR versus SNF ?Delirium precautions ? ?Hypertension ?Reasonably well-controlled.  Noted to be on just metoprolol for now.  Prior to admission he was on amlodipine-benazepril.  Resume amlodipine. ? ?Coronary artery disease s/p PCI to RCA  ?Continue medical management with lopressor, lipitor and xarelto  ? ?Hyperlipidemia ?Continue lipitor  ? ?GERD (gastroesophageal reflux disease) ?Continue PPI  ? ?OSA (obstructive sleep apnea) ?Does not wear cpap/bipap at night  ? ?BPH (benign prostatic hyperplasia) ?See above.  Outpatient follow-up with urology. ? ? ? ?Obesity ?Estimated body mass index  is 38.85 kg/m? as calculated from the following: ?  Height as of this encounter: '5\' 8"'$  (1.727 m). ?  Weight as of this encounter: 115.9 kg. ? ? ?DVT Prophylaxis: On Xarelto ?Code Status:  DNR ?Family Communication: Discussed with patient, son and significant other ?Disposition Plan: To be determined ? ?Status is: Inpatient ?Remains inpatient appropriate because: Acute kidney injury, physical deconditioning, debility ? ? ? ? ?Medications: Scheduled: ? amantadine  100 mg Oral Daily  ? amLODipine  10 mg Oral Daily  ? atorvastatin  10 mg Oral QODAY  ? And  ? atorvastatin  5 mg Oral QODAY  ? bisacodyl  10 mg Rectal Once  ? carbidopa-levodopa  3 tablet Oral Q8H  ? Chlorhexidine Gluconate Cloth  6 each Topical Daily  ? entacapone  200 mg Oral Q8H  ? ezetimibe  10 mg Oral QPM  ? fesoterodine  4 mg Oral Daily  ? finasteride  5 mg Oral Daily  ? LORazepam  1 mg Intravenous Once  ? metoprolol tartrate  5 mg Intravenous Once  ? metoprolol tartrate  50 mg Oral BID  ? pantoprazole  40 mg Oral Daily  ? polyethylene glycol  17 g Oral Daily  ? rivaroxaban  20 mg Oral Q supper  ? senna-docusate  1 tablet Oral BID  ? tadalafil  5 mg Oral QPM  ? ?Continuous: ?SEG:[id number] **OR** acetaminophen, clonazepam, ondansetron **OR** ondansetron (ZOFRAN) IV, traMADol ? ?Antibiotics: ?Anti-infectives (From admission, onward)  ? ? None  ? ?  ? ? ?Objective: ? ?Vital Signs ? ?Vitals:  ? 05/24/21 2048 05/25/21 0626 05/25/21 9485 05/25/21 0956  ?BP: (!) 141/97 (!) 140/103 (!) 152/108 (!) 129/99  ?Pulse: 86  94   ?Resp: '17 17 18   '$ ?Temp: 97.6 ?F (36.4 ?C) 98 ?F (36.7 ?C) 97.8 ?F (36.6 ?C)   ?TempSrc: Oral Oral Oral   ?SpO2: 96% 94% 93%   ?Weight:  115.9 kg    ?Height:      ? ? ?Intake/Output Summary (Last 24 hours) at 05/25/2021 1112 ?Last data filed at 05/25/2021 1020 ?Gross per 24 hour  ?Intake 720 ml  ?Output 1350 ml  ?Net -630 ml  ? ?Filed Weights  ? 05/23/21 0517 05/24/21 0418 05/25/21 0332  ?Weight: 119.2 kg 116.6 kg 115.9 kg  ? ? ?General appearance: Awake alert.  In no distress ?Resp: Clear to auscultation bilaterally.  Normal effort ?Cardio: S1-S2 is irregularly irregular ?GI: Abdomen is soft.  Nontender nondistended.  Bowel  sounds are present normal.  No masses organomegaly ?Extremities: No edema.   ?Neurologic: Alert and oriented x3.  No focal neurological deficits.  Noted to be tremulous ? ? ?Lab Results: ? ?Data Reviewed: I have personally reviewed labs and imaging study reports ? ?CBC: ?Recent Labs  ?Lab 05/21/21 ?1203 05/22/21 ?0213 05/23/21 ?0240  ?WBC 12.3* 8.5 7.6  ?NEUTROABS 10.5*  --   --   ?HGB 15.3 14.3 13.7  ?HCT 45.4 40.8 41.1  ?MCV 98.3 97.4 97.9  ?PLT 135* 143* 141*  ? ? ?Basic Metabolic Panel: ?Recent Labs  ?Lab 05/21/21 ?1203 05/22/21 ?0150 05/22/21 ?0213 05/23/21 ?4627 05/23/21 ?0240 05/24/21 ?0350 05/25/21 ?0200  ?NA 140  --  143  --  146* 143 142  ?K 3.7  --  3.1*  --  3.1* 3.2* 3.4*  ?CL 106  --  111  --  109 107 106  ?CO2 25  --  25  --  '30 29 26  '$ ?GLUCOSE 129*  --  130*  --  121* 134* 123*  ?BUN 57*  --  47*  --  '21 12 17  '$ ?CREATININE 4.70*  --  2.59*  --  1.07 0.73 0.71  ?CALCIUM 8.7*  --  8.3*  --  8.3* 8.6* 8.3*  ?MG  --  2.0  --  1.5*  --   --   --   ?PHOS  --  3.7  --   --   --   --   --   ? ? ?GFR: ?Estimated Creatinine Clearance: 106.2 mL/min (by C-G formula based on SCr of 0.71 mg/dL). ? ?Liver Function Tests: ?Recent Labs  ?Lab 05/21/21 ?1203  ?AST 15  ?ALT <5  ?ALKPHOS 70  ?BILITOT 1.5*  ?PROT 6.2*  ?ALBUMIN 3.5  ? ? ? ?Recent Results (from the past 240 hour(s))  ?Urine Culture     Status: None  ? Collection Time: 05/21/21  4:48 PM  ? Specimen: Urine, Clean Catch  ?Result Value Ref Range Status  ? Specimen Description URINE, CLEAN CATCH  Final  ? Special Requests NONE  Final  ? Culture   Final  ?  NO GROWTH ?Performed at [facility], [place] [address] [place], [place] [address] ?  ? Report Status 05/23/2021 FINAL  Final  ?  ? ? ?Radiology Studies: ?MR ABDOMEN W WO CONTRAST ? ?Result Date: 05/23/2021 ?CLINICAL DATA:  Complex right renal lesion for further characterization. EXAM: MRI ABDOMEN WITHOUT AND WITH CONTRAST TECHNIQUE: Multiplanar multisequence MR imaging of the abdomen was performed both  before and after the administration of intravenous contrast. CONTRAST:  50m GADAVIST GADOBUTROL 1 MMOL/ML IV SOLN COMPARISON:  Multiple exams, including 05/21/2021 CT scan FINDINGS: Body habitus reduces diagnostic s

## 2021-05-25 NOTE — PMR Pre-admission (Signed)
PMR Admission Coordinator Pre-Admission Assessment ? ?Patient: Isaiah Pena is an 71 y.o., male ?MRN: [id number] ?DOB: [birth year] ?Height: '5\' 8"'$  (172.7 cm) ?Weight: 115.9 kg ? ?Insurance Information ?HMO:     PPO:      PCP:      IPA:      80/20: yes     OTHER:  ?PRIMARY: Medicare A & B      Policy#: [id number]      Subscriber: patient ?CM Name:       Phone#:      Fax#:  ?Pre-Cert#:       Employer:  ?Benefits:  Phone #: verified eligibility via [place] on 05/25/21     Name:  ?Eff. Date: Part A & B effective 01/21/16     Deduct: $1,600      Out of Pocket Max: NA      Life Max: NA ?CIR: 100% coverage      SNF: 100% coverage days 1-20, 80% coverage days 21-100 ?Outpatient: 80% coverage    Co-Pay: 20% ?Home Health: 100% coverage    Co-Pay:  ?DME: 80% coverage     Co-Pay: 20% ?Providers: pt's choice ?SECONDARY: Generic Commercial    ?Policy#: [id number]     Phone#: [phone] ? ?Financial Counselor:       Phone#:  ? ?The ?Data Collection Information Summary? for patients in Inpatient Rehabilitation Facilities with attached ?Privacy Act [place] Records? was provided and verbally reviewed with: Patient ? ?Emergency Contact Information ?Contact Information   ? ? Name Relation Home Work Mobile  ? JACKSON,KRISTY Significant other [phone]  [phone]  ? ?  ? ? ?Current Medical History  ?Patient Admitting Diagnosis: debility s/p AKI with urinary retention ?History of Present Illness: Pt is a 71 year old male with medical hx significant for: HTN, OSA on BiPAP, Parkinson's Disease, CAD s/p PCI to RCA, paroxysmal A-fib, HLD, BPH. Pt presented to [facility] on 05/21/21 d/t c/o increased weakness, fatigue and constipation x3 days. Labs showed evidence of AKI, CBC with mild leukocytosis. Chest x-ray showed cardiomegaly and mild pleural effusion. Abdominal x-ray did not show evidence of obstruction although moderate stool burden noted. CT abdomen showed nonspecific bladder distension and perivesical soft  tissue stranding without apparent bladder wall thickening or focal bladder lesion. Foley catheter placed; over 1351m drained out. Calcified mass in lower pole of right kidney also noted. Therapy evaluations completed and CIR recommended d/t pt's deficits in functional mobility and inability to complete ADLs independently.  ?  ? ?[facility] by the rehabilitation admission coordinator and physician. ? ?Past Medical History  ?Past Medical History:  ?Diagnosis Date  ? Anticoagulant long-term use   ? xarelto  ? Bilateral lower extremity edema   ? BPH (benign prostatic hyperplasia)   ? BPH (benign prostatic hyperplasia)   ? Central serous retinopathy   ? LOSS OF CENTER VISION LEFT EYE W/ BLURRED VISION-  was treated  methotrexate by specialist at [facility] and released  ? Coronary artery disease cardiologist-  dr tTressia Minersturner  ? 01/ 2004  abnormal cardiolite  s/p  cardiac cath w/ PCI and DES to dRCA  ? Edema of extremities   ? CHRONIC   ? Erectile dysfunction   ? GERD (gastroesophageal reflux disease)   ? History of colon polyps   ? 2011 per pt benign  ? History of kidney stones   ? History of simple renal cyst   ? drained via radiology  ?  History of torn meniscus of right knee   ? Hyperlipidemia   ? Hypertension   ? Kidney stone   ? HX OF STONE-PASSED   ? Lower urinary tract symptoms (LUTS)   ? OSA (obstructive sleep apnea) 09/29/2011  ? NPSG 2004:  AHI 25/hr  Rx with bilevel.     ? OSA treated with BiPAP   ? moderate per study 01-23-2002  ? Parkinson's disease ([place])   ? neurologist-  dr ed hill ([facility])  ? Permanent atrial fibrillation ([place])   ? Persistent atrial fibrillation ([place]) 11/08/2012  ? S/P drug eluting coronary stent placement   ? 01/ 2004  x1 to dRCA  ? ? ?Has the patient had major surgery during 100 days prior to admission? No ? ?Family History   ?family history is not on file. ? ?Current Medications ? ?Current Facility-Administered Medications:   ?  acetaminophen (TYLENOL) tablet 650 mg, 650 mg, Oral, Q6H PRN, 650 mg at 05/24/21 0938 **OR** acetaminophen (TYLENOL) suppository 650 mg, 650 mg, Rectal, Q6H PRN, Orma Flaming, MD ?  amantadine (SYMMETREL) capsule 100 mg, 100 mg, Oral, Daily, Orma Flaming, MD, 100 mg at 05/25/21 0144 ?  amLODipine (NORVASC) tablet 10 mg, 10 mg, Oral, Daily, Bonnielee Haff, MD, 10 mg at 05/25/21 1158 ?  atorvastatin (LIPITOR) tablet 10 mg, 10 mg, Oral, QODAY, 10 mg at 05/24/21 1717 **AND** atorvastatin (LIPITOR) tablet 5 mg, 5 mg, Oral, Auburn Bilberry, MD, 5 mg at 05/25/21 0349 ?  bisacodyl (DULCOLAX) suppository 10 mg, 10 mg, Rectal, Once, Domenic Polite, MD ?  carbidopa-levodopa (SINEMET IR) 25-100 MG per tablet immediate release 3 tablet, 3 tablet, Oral, Q8H, Orma Flaming, MD, 3 tablet at 05/25/21 1791 ?  Chlorhexidine Gluconate Cloth 2 % PADS 6 each, 6 each, Topical, Daily, Domenic Polite, MD, 6 each at 05/25/21 0957 ?  clonazePAM (KLONOPIN) disintegrating tablet 0.25 mg, 0.25 mg, Oral, BID PRN, Orma Flaming, MD, 0.25 mg at 05/22/21 5056 ?  entacapone (COMTAN) tablet 200 mg, 200 mg, Oral, Q8H, Orma Flaming, MD, 200 mg at 05/25/21 9794 ?  ezetimibe (ZETIA) tablet 10 mg, 10 mg, Oral, QPM, Orma Flaming, MD, 10 mg at 05/24/21 1712 ?  fesoterodine (TOVIAZ) tablet 4 mg, 4 mg, Oral, Daily, Bonnielee Haff, MD, 4 mg at 05/25/21 1158 ?  finasteride (PROSCAR) tablet 5 mg, 5 mg, Oral, Daily, Shahmehdi, Seyed A, MD, 5 mg at 05/25/21 0957 ?  LORazepam (ATIVAN) injection 1 mg, 1 mg, Intravenous, Once, Shahmehdi, Seyed A, MD ?  metoprolol tartrate (LOPRESSOR) injection 5 mg, 5 mg, Intravenous, Once, Shalhoub, Sherryll Burger, MD ?  metoprolol tartrate (LOPRESSOR) tablet 50 mg, 50 mg, Oral, BID, Orma Flaming, MD, 50 mg at 05/25/21 0957 ?  ondansetron (ZOFRAN) tablet 4 mg, 4 mg, Oral, Q6H PRN **OR** ondansetron (ZOFRAN) injection 4 mg, 4 mg, Intravenous, Q6H PRN, Orma Flaming, MD ?  pantoprazole (PROTONIX) EC tablet 40 mg,  40 mg, Oral, Daily, Orma Flaming, MD, 40 mg at 05/25/21 0957 ?  polyethylene glycol (MIRALAX / GLYCOLAX) packet 17 g, 17 g, Oral, Daily, Orma Flaming, MD, 17 g at 05/24/21 0932 ?  rivaroxaban (XARELTO) tablet 20 mg, 20 mg, Oral, Q supper, Shahmehdi, Seyed A, MD, 20 mg at 05/24/21 1712 ?  senna-docusate (Senokot-S) tablet 1 tablet, 1 tablet, Oral, BID, Domenic Polite, MD, 1 tablet at 05/25/21 8016 ?  tadalafil (CIALIS) tablet 5 mg, 5 mg, Oral, QPM, Orma Flaming, MD, 5 mg at 05/24/21 1712 ?  traMADol (ULTRAM) tablet 50 mg, 50  mg, Oral, TID PRN, Orma Flaming, MD, 50 mg at 05/25/21 1018 ? ?Patients Current Diet:  ?Diet Order   ? ?       ?  Diet - low sodium heart healthy       ?  ?  Diet regular Room service appropriate? Yes; Fluid consistency: Thin  Diet effective now       ?  ? ?  ?  ? ?  ? ? ?Precautions / Restrictions ?Precautions ?Precautions: Fall ?Restrictions ?Weight Bearing Restrictions: No  ? ?Has the patient had 2 or more falls or a fall with injury in the past year? Yes ? ?Prior Activity Level ?Limited Community (1-2x/wk): gets out of house ~2 days/week ? ?Prior Functional Level ?Self Care: Did the patient need help bathing, dressing, using the toilet or eating? Needed some help ? ?Indoor Mobility: Did the patient need assistance with walking from room to room (with or without device)? Independent ? ?Stairs: Did the patient need assistance with internal or external stairs (with or without device)? Needed some help ? ?Functional Cognition: Did the patient need help planning regular tasks such as shopping or remembering to take medications? Needed some help ? ?Patient Information ?Are you of Hispanic, Latino/a,or Spanish origin?: A. No, not of Hispanic, Latino/a, or Spanish origin ?What is your race?: A. White ?Do you need or want an interpreter to communicate with a doctor or health care staff?: 0. No ? ?Patient's Response To:  ?Health Literacy and Transportation ?Is the patient able to respond to  health literacy and transportation needs?: Yes ?Health Literacy - How often do you need to have someone help you when you read instructions, pamphlets, or other written material from your doctor or pharmacy

## 2021-05-26 DIAGNOSIS — E876 Hypokalemia: Secondary | ICD-10-CM

## 2021-05-26 DIAGNOSIS — I4819 Other persistent atrial fibrillation: Secondary | ICD-10-CM

## 2021-05-26 DIAGNOSIS — G20 Parkinson's disease: Secondary | ICD-10-CM | POA: Diagnosis not present

## 2021-05-26 DIAGNOSIS — I10 Essential (primary) hypertension: Secondary | ICD-10-CM | POA: Diagnosis not present

## 2021-05-26 DIAGNOSIS — R5381 Other malaise: Secondary | ICD-10-CM | POA: Diagnosis not present

## 2021-05-26 LAB — MAGNESIUM: Magnesium: 1.6 mg/dL — ABNORMAL LOW (ref 1.7–2.4)

## 2021-05-26 MED ORDER — CHLORHEXIDINE GLUCONATE CLOTH 2 % EX PADS: 6.0000 | MEDICATED_PAD | Freq: Every day | CUTANEOUS | Status: DC

## 2021-05-26 MED ORDER — BENAZEPRIL HCL 5 MG PO TABS
5.0000 mg | ORAL_TABLET | Freq: Every day | ORAL | Status: DC
  Administered 2021-05-26 – 2021-06-05 (×11): 5 mg via ORAL
  Filled 2021-05-26 (×11): qty 1

## 2021-05-26 MED ORDER — CHLORHEXIDINE GLUCONATE CLOTH 2 % EX PADS
6.0000 | MEDICATED_PAD | Freq: Two times a day (BID) | CUTANEOUS | Status: DC
  Administered 2021-05-26 – 2021-06-05 (×20): 6 via TOPICAL

## 2021-05-26 MED ORDER — POTASSIUM CHLORIDE CRYS ER 20 MEQ PO TBCR
20.0000 meq | EXTENDED_RELEASE_TABLET | Freq: Every day | ORAL | Status: DC
  Administered 2021-05-26 – 2021-06-03 (×9): 20 meq via ORAL
  Filled 2021-05-26 (×9): qty 1

## 2021-05-26 NOTE — Progress Notes (Addendum)
?                                                       PROGRESS NOTE ? ? ?Subjective/Complaints: ?Pt says he had a good night. Typical aches and pains for him this morning. Ready to get started with therapies today.  ? ?ROS: Patient denies fever, rash, sore throat, blurred vision, dizziness, nausea, vomiting, diarrhea, cough, shortness of breath or chest pain, joint or back/neck pain, headache, or mood change.  ? ? ?Objective: ?  ?No results found. ?No results for input(s): WBC, HGB, HCT, PLT in the last 72 hours. ?Recent Labs  ?  05/24/21 ?6962 05/25/21 ?0200  ?NA 143 142  ?K 3.2* 3.4*  ?CL 107 106  ?CO2 29 26  ?GLUCOSE 134* 123*  ?BUN 12 17  ?CREATININE 0.73 0.71  ?CALCIUM 8.6* 8.3*  ? ? ?Intake/Output Summary (Last 24 hours) at 05/26/2021 0941 ?Last data filed at 05/26/2021 0825 ?Gross per 24 hour  ?Intake 340 ml  ?Output 2925 ml  ?Net -2585 ml  ?  ? ?  ? ?Physical Exam: ?Vital Signs ?Blood pressure (!) 152/103, pulse 64, temperature 97.8 ?F (36.6 ?C), temperature source Oral, resp. rate 17, height '5\' 9"'$  (1.753 m), weight 118 kg, SpO2 96 %. ? ?General: Alert and oriented x 3, No apparent distress. obese ?HEENT: Head is normocephalic, atraumatic, PERRLA, EOMI, sclera anicteric, oral mucosa pink and moist, dentition intact, ext ear canals clear,  ?Neck: Supple without JVD or lymphadenopathy ?Heart: Reg rate and rhythm. No murmurs rubs or gallops ?Chest: CTA bilaterally without wheezes, rales, or rhonchi; no distress ?Abdomen: Soft, non-tender, non-distended, bowel sounds positive. ?Extremities: No clubbing, cyanosis, or edema. Pulses are 2+ ?Psych: Pt's affect is appropriate. Pt is cooperative ?Skin: Clean and intact without signs of breakdown, chronic vasc changes in LE's ?Neuro:  Alert and oriented x 3. Normal insight and awareness. Intact Memory. Normal language and speech. Cranial nerve exam unremarkable. Masked facies. Mild cogwheeling in UE and Le's. Motor 4-5/5 in UE and 3-4/5 in LE's. No focal sensory  findings ?Musculoskeletal: mild pain with ROM of hips and shoulder. Fair sitting posture  ? ? ?Assessment/Plan: ?1. Functional deficits which require 3+ hours per day of interdisciplinary therapy in a comprehensive inpatient rehab setting. ?Physiatrist is providing close team supervision and 24 hour management of active medical problems listed below. ?Physiatrist and rehab team continue to assess barriers to discharge/monitor patient progress toward functional and medical goals ? ?Care Tool: ? ?Bathing ?   ?   ?   ?  ?  ?Bathing assist   ?  ?  ?Upper Body Dressing/Undressing ?Upper body dressing   ?What is the patient wearing?: [place] only ?   ?Upper body assist   ?   ?Lower Body Dressing/Undressing ?Lower body dressing ? ? ?   ?What is the patient wearing?:  (PT does not wear any underwear) ? ?  ? ?Lower body assist   ?   ? ?Toileting ?Toileting    ?Toileting assist   ?  ?  ?Transfers ?Chair/bed transfer ? ?Transfers assist ?   ? ?  ?  ?  ?Locomotion ?Ambulation ? ? ?Ambulation assist ? ?   ? ?  ?  ?   ? ?Walk 10 feet activity ? ? ?Assist ?   ? ?  ?   ? ?  Walk 50 feet activity ? ? ?Assist   ? ?  ?   ? ? ?Walk 150 feet activity ? ? ?Assist   ? ?  ?  ?  ? ?Walk 10 feet on uneven surface  ?activity ? ? ?Assist   ? ? ?  ?   ? ?Wheelchair ? ? ? ? ?Assist   ?  ?  ? ?  ?   ? ? ?Wheelchair 50 feet with 2 turns activity ? ? ? ?Assist ? ?  ?  ? ? ?   ? ?Wheelchair 150 feet activity  ? ? ? ?Assist ?   ? ? ?   ? ?Blood pressure (!) 152/103, pulse 64, temperature 97.8 ?F (36.6 ?C), temperature source Oral, resp. rate 17, height '5\' 9"'$  (1.753 m), weight 118 kg, SpO2 96 %. ? ?Medical Problem List and Plan: ?1. Functional deficits secondary to pseudo-exacerbation of parkinson's disease/disability ?            -patient may shower ?            -ELOS/Goals: 7-10 days supervision to min assist goals ? -Patient is beginning CIR therapies today including PT and OT  ?2.  Antithrombotics: ?-DVT/anticoagulation:  Pharmaceutical:  Xarelto ?            -antiplatelet therapy: n/a ?3. Pain Management: tramadol and tylenol prn. Appears fairly well managed at present ?            -monitor activity tolerance ?4. Mood: team to provide ego support ?            -klonopin prn for anxiety ?            -antipsychotic agents: n/a ?5. Neuropsych: This patient is capable of making decisions on his own behalf. ?6. Skin/Wound Care: local skin, foley care ?7. Fluids/Electrolytes/Nutrition: encourage appropriate nutrition ?            -still with hypomagnesia--continue oral supplementation  ?8. Parkinson's Disease: ?            -amantadine, sinemet, comtan per baseline regimen ? -seems fairly well compensated ?9. AKI/Urine retention/BPH: ?            -Foley in place and will follow up with urology ([facility]) in about 2 weeks ?            -finasteride and fesoterodine per urology ?10. HTN: 5/7 remains quite elevate ?            -continue norvasc, lopressor  ?            -add in benazepril which he used in combo with amlodipine pta  ?11. Slow transit constipation: ?            -scheduled miralax and senna-s ?            -prn's ordered ?12. Hx of CAD/persistent A -fib --lopressor, lipitor, xarelto ?            -HR borderline at present ?            5/7 -Mg++ still only 1.6 but only received one dose so far of mag gluc '500mg'$  bid ?  -consider IVPB Mg++ 5/8 if still in the 1.5-6 range ?  -will add daily potassium as well as it's low as well ?13. OSA: not currently using bipap ?14. Morbid obesity   ?15. Right Renal mass : c/w cystic lesion on MRI ?  ? ?LOS: ?1 days ?A FACE TO FACE EVALUATION  WAS PERFORMED ? ?Meredith Staggers ?05/26/2021, 9:41 AM  ? ?  ?

## 2021-05-26 NOTE — Evaluation (Signed)
Physical Therapy Assessment and Plan ? ?Patient Details  ?Name: Isaiah Pena ?MRN: [id number] ?Date of Birth: 15-Nov-1950 ? ?PT Diagnosis: Abnormality of gait, Difficulty walking, Low back pain, and Pain in joint ?Rehab Potential: Good ?ELOS: 7-10 days  ? ?Today's Date: 05/26/2021 ?PT Individual Time: 1000-1108 ?PT Individual Time Calculation (min): 68 min   ? ?Hospital Problem: Principal Problem: ?  Debility ? ? ?Past Medical History:  ?Past Medical History:  ?Diagnosis Date  ? Anticoagulant long-term use   ? xarelto  ? Bilateral lower extremity edema   ? BPH (benign prostatic hyperplasia)   ? BPH (benign prostatic hyperplasia)   ? Central serous retinopathy   ? LOSS OF CENTER VISION LEFT EYE W/ BLURRED VISION-  was treated  methotrexate by specialist at [facility] and released  ? Coronary artery disease cardiologist-  dr Tressia Miners turner  ? 01/ 2004  abnormal cardiolite  s/p  cardiac cath w/ PCI and DES to dRCA  ? Edema of extremities   ? CHRONIC   ? Erectile dysfunction   ? GERD (gastroesophageal reflux disease)   ? History of colon polyps   ? 2011 per pt benign  ? History of kidney stones   ? History of simple renal cyst   ? drained via radiology  ? History of torn meniscus of right knee   ? Hyperlipidemia   ? Hypertension   ? Kidney stone   ? HX OF STONE-PASSED   ? Lower urinary tract symptoms (LUTS)   ? OSA (obstructive sleep apnea) 09/29/2011  ? NPSG 2004:  AHI 25/hr  Rx with bilevel.     ? OSA treated with BiPAP   ? moderate per study 01-23-2002  ? Parkinson's disease ([place])   ? neurologist-  dr ed hill ([facility])  ? Permanent atrial fibrillation ([place])   ? Persistent atrial fibrillation ([place]) 11/08/2012  ? S/P drug eluting coronary stent placement   ? 01/ 2004  x1 to dRCA  ? ?Past Surgical History:  ?Past Surgical History:  ?Procedure Laterality Date  ? CARDIOVASCULAR STRESS TEST  05-07-2015   dr Tressia Miners turner  ? normal nuclear study w/ no ischemia/  normal LV function and wall motion , stress ef 55%  ? COLONOSCOPY  WITH PROPOFOL  2011  ? CORONARY ANGIOPLASTY WITH STENT PLACEMENT  02-11-2002  dr Daneen Schick  ? abnormal cardiolite--  PCI and DES to dRCA (99%),  pRCA luminal irregularities, mild to moderate LAD and CFX disease,  normal LVF  ? CYSTOSCOPY WITH INSERTION OF UROLIFT N/A 01/28/2016  ? Procedure: CYSTOSCOPY WITH INSERTION OF UROLIFT;  Surgeon: Franchot Gallo, MD;  Location: [facility];  Service: Urology;  Laterality: N/A;  ? HERNIA REPAIR    ? AGE 56  ? KNEE ARTHROSCOPY  03/21/2011  ? Procedure: ARTHROSCOPY KNEE;  Surgeon: Tobi Bastos, MD;  Location: [facility];  Service: Orthopedics;  Laterality: Right;  ? ? ?Assessment & Plan ?Clinical Impression:  ?This is a 71 year old patient with Parkinson disease and paroxysmal atrial fibrillation on Xarelto as well as BPH.  He presented to the emergency room on 05/21/2021 with a 3 to 4-day history of dizziness and weakness as well as urinary retention.  He is also having constipation.  Lab work-up revealed acute kidney injury and his bladder was distended on CT of the abdomen and pelvis.  He was thought to have bladder outlet obstruction from prostate gland enlargement.  Mild bilateral hydronephrosis was seen.  Foley catheter was ultimately inserted as patient  was unable to pass urine.  Is also felt that his Parkinson disease contributed to his bladder issues.  The patient has been followed by Dr. Diona Fanti and has failed other medications in the past.  Patient demonstrated ongoing difficulties with mobility and self-care given the medical issues listed above.  He was assessed by the therapy and inpatient rehab team's and is felt that he could benefit from an inpatient rehab stay to improve his functional ability and self-care. Patient transferred to CIR on 05/25/2021 .  ? ?Patient currently requires mod with mobility secondary to decreased cardiorespiratoy endurance and decreased sitting balance, decreased standing balance, decreased postural control, and decreased  balance strategies.  Prior to hospitalization, patient was min with mobility and lived with Spouse in a House home.  Home access is 2 steps in front; 4 steps in backStairs to enter. ? ?Patient will benefit from skilled PT intervention to maximize safe functional mobility, minimize fall risk, and decrease caregiver burden for planned discharge home with 24 hour assist.  Anticipate patient will benefit from follow up [facility] at discharge. ? ?PT - End of Session ?Activity Tolerance: Tolerates 30+ min activity with multiple rests ?Endurance Deficit: Yes ?Endurance Deficit Description: frequent rest breaks during functional activity ?PT Assessment ?Rehab Potential (ACUTE/IP ONLY): Good ?PT Patient demonstrates impairments in the following area(s): Balance;Endurance;Pain;Safety ?PT Transfers Functional Problem(s): Bed Mobility;Bed to Chair;Car;Furniture;Floor ?PT Locomotion Functional Problem(s): Ambulation;Wheelchair Mobility;Stairs ?PT Plan ?PT Intensity: Minimum of 1-2 x/day ,45 to 90 minutes ?PT Frequency: 5 out of 7 days ?PT Duration Estimated Length of Stay: 7-10 days ?PT Treatment/Interventions: Ambulation/gait training;Balance/vestibular training;Community reintegration;Discharge planning;Disease management/prevention;DME/adaptive equipment instruction;Functional mobility training;Neuromuscular re-education;Pain management;Patient/family education;Psychosocial support;Stair training;Therapeutic Activities;Therapeutic Exercise;UE/LE Strength taining/ROM;UE/LE Coordination activities;Wheelchair propulsion/positioning ?PT Transfers Anticipated Outcome(s): Supervision with LRAD ?PT Locomotion Anticipated Outcome(s): Supervision at ambulatory level with LRAD ?PT Recommendation ?Recommendations for Other Services: Neuropsych consult;Therapeutic Recreation consult ?Therapeutic Recreation Interventions: Stress management ?Follow Up Recommendations: Home health PT ?Patient destination: Home ?Equipment Recommended: Rolling  walker with 5" wheels;To be determined ?Equipment Details: TBD pending progress, pt owns RW, [facility], and rollator ? ? ?PT Evaluation ?Precautions/Restrictions ?Precautions ?Precautions: Fall ?Precaution Comments: PD at baseline ?Restrictions ?Weight Bearing Restrictions: No ?Pain Interference ?Pain Interference ?Pain Effect on Sleep: 2. Occasionally ?Pain Interference with Therapy Activities: 2. Occasionally ?Pain Interference with Day-to-Day Activities: 2. Occasionally ?Home Living/Prior Functioning ?Home Living ?Available Help at Discharge: Family;Available 24 hours/day ?Type of Home: House ?Home Access: Stairs to enter ?Entrance Stairs-Number of Steps: 2 steps in front; 4 steps in back ?Entrance Stairs-Rails: Left (on back steps only, no handrails on front steps) ?Home Layout: One level ? Lives With: Spouse ?Prior Function ?Level of Independence: Requires assistive device for independence;Needs assistance with tranfers;Needs assistance with gait ? Able to Take Stairs?: Yes ?Driving: No ?Leisure: Hobbies-yes (Comment) (per chart used to play bass, enjoys jazz and blues music) ?Vision/Perception  ?Vision - History ?Baseline Vision: Wears glasses only for reading ?Patient Visual Report: No change from baseline ?Perception ?Perception: Within Functional Limits ?Praxis ?Praxis: Intact  ?Cognition ?Overall Cognitive Status: Within Functional Limits for tasks assessed ?Arousal/Alertness: Awake/alert ?Orientation Level: Oriented X4 ?Year: 2023 ?Attention: Focused ?Focused Attention: Appears intact ?Memory: Appears intact ?Awareness: Appears intact ?Problem Solving: Appears intact ?Safety/Judgment: Appears intact ?Sensation ?Sensation ?Light Touch: Appears Intact ?Proprioception: Appears Intact ?Coordination ?Gross Motor Movements are Fluid and Coordinated: No ?Fine Motor Movements are Fluid and Coordinated: No (tremors in BUE) ?Coordination and Movement Description: impaired 2/2 global weakness ?Heel Shin Test: impaired  bilaterally ?Motor  ?Motor ?Motor:  Other (comment) ?Motor - Skilled Clinical Observations: impaired 2/2 global weakness  ?Trunk/Postural Assessment  ?Cervical Assessment ?Cervical Assessment: Exceptions to [facility]

## 2021-05-26 NOTE — Plan of Care (Signed)
?  Problem: Consults ?Goal: RH GENERAL PATIENT EDUCATION ?Description: See Patient Education module for education specifics. ?Outcome: Progressing ?Goal: Skin Care Protocol Initiated - if Braden Score 18 or less ?Description: If consults are not indicated, leave blank or document N/A ?Outcome: Progressing ?Goal: Nutrition Consult-if indicated ?Outcome: Progressing ?  ?Problem: RH BOWEL ELIMINATION ?Goal: RH STG MANAGE BOWEL WITH ASSISTANCE ?Description: STG Manage Bowel with Assistance. ?Outcome: Progressing ?  ?Problem: RH BLADDER ELIMINATION ?Goal: RH STG MANAGE BLADDER WITH ASSISTANCE ?Description: STG Manage Bladder With Assistance ?Outcome: Progressing ?  ?Problem: RH SKIN INTEGRITY ?Goal: RH STG SKIN FREE OF INFECTION/BREAKDOWN ?Outcome: Progressing ?  ?Problem: RH SAFETY ?Goal: RH STG ADHERE TO SAFETY PRECAUTIONS W/ASSISTANCE/DEVICE ?Description: STG Adhere to Safety Precautions With Assistance/Device. ?Outcome: Progressing ?  ?Problem: RH PAIN MANAGEMENT ?Goal: RH STG PAIN MANAGED AT OR BELOW PT'S PAIN GOAL ?Outcome: Progressing ?  ?Problem: RH KNOWLEDGE DEFICIT GENERAL ?Goal: RH STG INCREASE KNOWLEDGE OF SELF CARE AFTER HOSPITALIZATION ?Outcome: Progressing ?  ?

## 2021-05-26 NOTE — Plan of Care (Signed)
?  Problem: RH Balance ?Goal: LTG Patient will maintain dynamic standing balance (PT) ?Description: LTG:  Patient will maintain dynamic standing balance with assistance during mobility activities (PT) ?Flowsheets (Taken 05/26/2021 1227) ?LTG: Pt will maintain dynamic standing balance during mobility activities with:: Supervision/Verbal cueing ?  ?Problem: Sit to Stand ?Goal: LTG:  Patient will perform sit to stand with assistance level (PT) ?Description: LTG:  Patient will perform sit to stand with assistance level (PT) ?Flowsheets (Taken 05/26/2021 1227) ?LTG: PT will perform sit to stand in preparation for functional mobility with assistance level: Supervision/Verbal cueing ?  ?Problem: RH Bed Mobility ?Goal: LTG Patient will perform bed mobility with assist (PT) ?Description: LTG: Patient will perform bed mobility with assistance, with/without cues (PT). ?Flowsheets (Taken 05/26/2021 1227) ?LTG: Pt will perform bed mobility with assistance level of: Minimal Assistance - Patient > 75% ?  ?Problem: RH Bed to Chair Transfers ?Goal: LTG Patient will perform bed/chair transfers w/assist (PT) ?Description: LTG: Patient will perform bed to chair transfers with assistance (PT). ?Flowsheets (Taken 05/26/2021 1227) ?LTG: Pt will perform Bed to Chair Transfers with assistance level: Supervision/Verbal cueing ?  ?Problem: RH Car Transfers ?Goal: LTG Patient will perform car transfers with assist (PT) ?Description: LTG: Patient will perform car transfers with assistance (PT). ?Flowsheets (Taken 05/26/2021 1227) ?LTG: Pt will perform car transfers with assist:: Supervision/Verbal cueing ?  ?Problem: RH Ambulation ?Goal: LTG Patient will ambulate in controlled environment (PT) ?Description: LTG: Patient will ambulate in a controlled environment, # of feet with assistance (PT). ?Flowsheets (Taken 05/26/2021 1227) ?LTG: Pt will ambulate in controlled environ  assist needed:: Supervision/Verbal cueing ?LTG: Ambulation distance in controlled  environment: 150 ft with LRAD ?Goal: LTG Patient will ambulate in home environment (PT) ?Description: LTG: Patient will ambulate in home environment, # of feet with assistance (PT). ?Flowsheets (Taken 05/26/2021 1227) ?LTG: Pt will ambulate in home environ  assist needed:: Supervision/Verbal cueing ?LTG: Ambulation distance in home environment: 75 ft with LRAD ?  ?Problem: RH Stairs ?Goal: LTG Patient will ambulate up and down stairs w/assist (PT) ?Description: LTG: Patient will ambulate up and down # of stairs with assistance (PT) ?Flowsheets (Taken 05/26/2021 1227) ?LTG: Pt will ambulate up/down stairs assist needed:: Minimal Assistance - Patient > 75% ?LTG: Pt will  ambulate up and down number of stairs: 4 stairs with L handrail per home setup ?  ?

## 2021-05-26 NOTE — Evaluation (Signed)
Occupational Therapy Assessment and Plan ? ?Patient Details  ?Name: Isaiah Pena ?MRN: [id number] ?Date of Birth: 07-Jan-1951 ? ?OT Diagnosis: abnormal posture and muscle weakness (generalized) ?Rehab Potential: Rehab Potential (ACUTE ONLY): Excellent ?ELOS: 7-10 days  ? ?Today's Date: 05/26/2021 ?OT Individual Time: 1300-1400 ?OT Individual Time Calculation (min): 60 min    ? ?Hospital Problem: Principal Problem: ?  Debility ? ? ?Past Medical History:  ?Past Medical History:  ?Diagnosis Date  ? Anticoagulant long-term use   ? xarelto  ? Bilateral lower extremity edema   ? BPH (benign prostatic hyperplasia)   ? BPH (benign prostatic hyperplasia)   ? Central serous retinopathy   ? LOSS OF CENTER VISION LEFT EYE W/ BLURRED VISION-  was treated  methotrexate by specialist at [facility] and released  ? Coronary artery disease cardiologist-  dr Tressia Miners turner  ? 01/ 2004  abnormal cardiolite  s/p  cardiac cath w/ PCI and DES to dRCA  ? Edema of extremities   ? CHRONIC   ? Erectile dysfunction   ? GERD (gastroesophageal reflux disease)   ? History of colon polyps   ? 2011 per pt benign  ? History of kidney stones   ? History of simple renal cyst   ? drained via radiology  ? History of torn meniscus of right knee   ? Hyperlipidemia   ? Hypertension   ? Kidney stone   ? HX OF STONE-PASSED   ? Lower urinary tract symptoms (LUTS)   ? OSA (obstructive sleep apnea) 09/29/2011  ? NPSG 2004:  AHI 25/hr  Rx with bilevel.     ? OSA treated with BiPAP   ? moderate per study 01-23-2002  ? Parkinson's disease ([place])   ? neurologist-  dr ed hill ([facility])  ? Permanent atrial fibrillation ([place])   ? Persistent atrial fibrillation ([place]) 11/08/2012  ? S/P drug eluting coronary stent placement   ? 01/ 2004  x1 to dRCA  ? ?Past Surgical History:  ?Past Surgical History:  ?Procedure Laterality Date  ? CARDIOVASCULAR STRESS TEST  05-07-2015   dr Tressia Miners turner  ? normal nuclear study w/ no ischemia/  normal LV function and wall motion , stress ef  55%  ? COLONOSCOPY WITH PROPOFOL  2011  ? CORONARY ANGIOPLASTY WITH STENT PLACEMENT  02-11-2002  dr Daneen Schick  ? abnormal cardiolite--  PCI and DES to dRCA (99%),  pRCA luminal irregularities, mild to moderate LAD and CFX disease,  normal LVF  ? CYSTOSCOPY WITH INSERTION OF UROLIFT N/A 01/28/2016  ? Procedure: CYSTOSCOPY WITH INSERTION OF UROLIFT;  Surgeon: Franchot Gallo, MD;  [facility];  Service: Urology;  Laterality: N/A;  ? HERNIA REPAIR    ? AGE 66  ? KNEE ARTHROSCOPY  03/21/2011  ? Procedure: ARTHROSCOPY KNEE;  Surgeon: Tobi Bastos, MD;  Location: [facility];  Service: Orthopedics;  Laterality: Right;  ? ? ?Assessment & Plan ?Clinical Impression: Patient is a 71 y.o. year old This is a 71 year old patient with Parkinson disease and paroxysmal atrial fibrillation on Xarelto as well as BPH.  He presented to the emergency room on 05/21/2021 with a 3 to 4-day history of dizziness and weakness as well as urinary retention.  He is also having constipation.  Lab work-up revealed acute kidney injury and his bladder was distended on CT of the abdomen and pelvis.  He was thought to have bladder outlet obstruction from prostate gland enlargement.  Mild bilateral hydronephrosis was seen.  Foley catheter  was ultimately inserted as patient was unable to pass urine.  Is also felt that his Parkinson disease contributed to his bladder issues.  The patient has been followed by Dr. Diona Fanti and has failed other medications in the past.  Patient demonstrated ongoing difficulties with mobility and self-care given the medical issues listed above.  He was assessed by the therapy and inpatient rehab team's and is felt that he could benefit from an inpatient rehab stay to improve his functional ability and self-care. Patient transferred to CIR on 05/25/2021.   ? ?Patient currently requires mod with basic self-care skills secondary to muscle weakness, decreased cardiorespiratoy endurance, decreased coordination  and decreased motor planning, and decreased sitting balance, decreased standing balance, decreased postural control, and decreased balance strategies.  Prior to hospitalization, patient could complete toileting and bathing with supervision, dressing with mod assist, self feeding with supervision although with a lot of droppage per pt and wife report. ? ?Patient will benefit from skilled intervention to increase independence with basic self-care skills prior to discharge home with care partner.  Anticipate patient will require 24 hour supervision and minimal physical assistance and follow up home health. ? ?OT - End of Session ?Activity Tolerance: Tolerates 30+ min activity with multiple rests ?Endurance Deficit: Yes ?Endurance Deficit Description: frequent rest breaks during functional activity ?OT Assessment ?Rehab Potential (ACUTE ONLY): Excellent ?OT Barriers to Discharge: Weight;Neurogenic Bowel & Bladder ?OT Patient demonstrates impairments in the following area(s): Balance;Safety;Endurance;Motor ?OT Basic ADL's Functional Problem(s): Eating;Grooming;Bathing;Dressing;Toileting ?OT Transfers Functional Problem(s): Toilet;Tub/Shower ?OT Additional Impairment(s): Fuctional Use of Upper Extremity ?OT Plan ?OT Intensity: Minimum of 1-2 x/day, 45 to 90 minutes ?OT Frequency: 5 out of 7 days ?OT Duration/Estimated Length of Stay: 7-10 days ?OT Treatment/Interventions: Balance/vestibular training;Discharge planning;Functional electrical stimulation;Pain management;Self Care/advanced ADL retraining;Therapeutic Activities;UE/LE Coordination activities;Therapeutic Exercise;Skin care/wound managment;Patient/family education;Functional mobility training;Disease mangement/prevention;Community reintegration;DME/adaptive equipment instruction;Neuromuscular re-education;Psychosocial support;Splinting/orthotics;UE/LE Strength taining/ROM;Wheelchair propulsion/positioning ?OT Self Feeding Anticipated Outcome(s): supervision ?OT  Basic Self-Care Anticipated Outcome(s): min assist ?OT Toileting Anticipated Outcome(s): min assist ?OT Bathroom Transfers Anticipated Outcome(s): supervision ?OT Recommendation ?Patient destination: Home ?Follow Up Recommendations: Home health OT;24 hour supervision/assistance ?Equipment Recommended: To be determined ? ? ?OT Evaluation ?Precautions/Restrictions  ?Precautions ?Precautions: Fall ?Precaution Comments: PD at baseline ?Restrictions ?Weight Bearing Restrictions: No ?General ?Chart Reviewed: Yes ?Pain ?Pain Assessment ?Pain Scale: 0-10 ?Pain Score: 0-No pain ?Home Living/Prior Functioning ?Home Living ?Family/patient expects to be discharged to:: Private residence ?Living Arrangements: Spouse/significant other ?Available Help at Discharge: Family, Available 24 hours/day ?Type of Home: House ?Home Access: Stairs to enter ?Entrance Stairs-Number of Steps: 2 steps in front; 4 steps in back ?Entrance Stairs-Rails: Left (on back steps; no hand rails on front steps) ?Home Layout: One level ?Bathroom Shower/Tub: Walk-in shower ?Bathroom Toilet: Handicapped height ?Additional Comments: very supportive family/community ? Lives With: Spouse ?Prior Function ?Level of Independence: Requires assistive device for independence, Needs assistance with tranfers, Needs assistance with gait, Needs assistance with ADLs ? Able to Take Stairs?: Yes ?Driving: No ?Leisure: Hobbies-yes (Comment) (per chart, used to play bass, loves jazz and blues) ?Vision ?Baseline Vision/History: 0 No visual deficits ?Ability to See in Adequate Light: 0 Adequate ?Patient Visual Report: No change from baseline ?Vision Assessment?: No apparent visual deficits ?Perception  ?Perception: Within Functional Limits ?Praxis ?Praxis: Intact ?Cognition ?Cognition ?Overall Cognitive Status: Within Functional Limits for tasks assessed ?Arousal/Alertness: Awake/alert ?Orientation Level: Person;Place;Situation ?Person: Oriented ?Place: Oriented ?Situation:  Oriented ?Memory: Appears intact ?Attention: Focused ?Focused Attention: Appears intact ?Awareness: Appears intact ?Problem Solving: Appears intact ?Executive  Function: Reasoning;Sequencing;Decision Making

## 2021-05-26 NOTE — Plan of Care (Signed)
?  Problem: RH Eating ?Goal: LTG Patient will perform eating w/assist, cues/equip (OT) ?Description: LTG: Patient will perform eating with assist, with/without cues using equipment (OT) ?Flowsheets (Taken 05/26/2021 1527) ?LTG: Pt will perform eating with assistance level of: Supervision/Verbal cueing ?LTG: Pt will perform eating using equipment: Weighted utensil ?  ?Problem: RH Grooming ?Goal: LTG Patient will perform grooming w/assist,cues/equip (OT) ?Description: LTG: Patient will perform grooming with assist, with/without cues using equipment (OT) ?Flowsheets (Taken 05/26/2021 1527) ?LTG: Pt will perform grooming with assistance level of: Supervision/Verbal cueing ?  ?Problem: RH Bathing ?Goal: LTG Patient will bathe all body parts with assist levels (OT) ?Description: LTG: Patient will bathe all body parts with assist levels (OT) ?Flowsheets (Taken 05/26/2021 1527) ?LTG: Pt will perform bathing with assistance level/cueing: Minimal Assistance - Patient > 75% ?LTG: Position pt will perform bathing: Shower ?  ?Problem: RH Dressing ?Goal: LTG Patient will perform upper body dressing (OT) ?Description: LTG Patient will perform upper body dressing with assist, with/without cues (OT). ?Flowsheets (Taken 05/26/2021 1527) ?LTG: Pt will perform upper body dressing with assistance level of: Minimal Assistance - Patient > 75% ?Goal: LTG Patient will perform lower body dressing w/assist (OT) ?Description: LTG: Patient will perform lower body dressing with assist, with/without cues in positioning using equipment (OT) ?Flowsheets (Taken 05/26/2021 1527) ?LTG: Pt will perform lower body dressing with assistance level of: Minimal Assistance - Patient > 75% ?  ?Problem: RH Toileting ?Goal: LTG Patient will perform toileting task (3/3 steps) with assistance level (OT) ?Description: LTG: Patient will perform toileting task (3/3 steps) with assistance level (OT)  ?Flowsheets (Taken 05/26/2021 1527) ?LTG: Pt will perform toileting task (3/3  steps) with assistance level: Minimal Assistance - Patient > 75% ?  ?Problem: RH Toilet Transfers ?Goal: LTG Patient will perform toilet transfers w/assist (OT) ?Description: LTG: Patient will perform toilet transfers with assist, with/without cues using equipment (OT) ?Flowsheets (Taken 05/26/2021 1527) ?LTG: Pt will perform toilet transfers with assistance level of: Supervision/Verbal cueing ?  ?Problem: RH Tub/Shower Transfers ?Goal: LTG Patient will perform tub/shower transfers w/assist (OT) ?Description: LTG: Patient will perform tub/shower transfers with assist, with/without cues using equipment (OT) ?Flowsheets (Taken 05/26/2021 1527) ?LTG: Pt will perform tub/shower stall transfers with assistance level of: Contact Guard/Touching assist ?LTG: Pt will perform tub/shower transfers from: Walk in shower ?  ?

## 2021-05-27 LAB — MAGNESIUM: Magnesium: 1.4 mg/dL — ABNORMAL LOW (ref 1.7–2.4)

## 2021-05-27 LAB — COMPREHENSIVE METABOLIC PANEL
ALT: 6 U/L (ref 0–44)
AST: 15 U/L (ref 15–41)
Albumin: 3 g/dL — ABNORMAL LOW (ref 3.5–5.0)
Alkaline Phosphatase: 70 U/L (ref 38–126)
Anion gap: 7 (ref 5–15)
BUN: 15 mg/dL (ref 8–23)
CO2: 32 mmol/L (ref 22–32)
Calcium: 8.5 mg/dL — ABNORMAL LOW (ref 8.9–10.3)
Chloride: 104 mmol/L (ref 98–111)
Creatinine, Ser: 0.73 mg/dL (ref 0.61–1.24)
GFR, Estimated: >60 mL/min (ref >60)
Glucose, Bld: 122 mg/dL — ABNORMAL HIGH (ref 70–99)
Potassium: 3.3 mmol/L — ABNORMAL LOW (ref 3.5–5.1)
Sodium: 143 mmol/L (ref 135–145)
Total Bilirubin: 1 mg/dL (ref 0.3–1.2)
Total Protein: 5.8 g/dL — ABNORMAL LOW (ref 6.5–8.1)

## 2021-05-27 LAB — CBC
HCT: 45.2 % (ref 39.0–52.0)
Hemoglobin: 15.3 g/dL (ref 13.0–17.0)
MCH: 33.3 pg (ref 26.0–34.0)
MCHC: 33.8 g/dL (ref 30.0–36.0)
MCV: 98.3 fL (ref 80.0–100.0)
Platelets: 175 10*3/uL (ref 150–400)
RBC: 4.6 MIL/uL (ref 4.22–5.81)
RDW: 12.6 % (ref 11.5–15.5)
WBC: 7.9 10*3/uL (ref 4.0–10.5)
nRBC: 0 % (ref 0.0–0.2)

## 2021-05-27 NOTE — Progress Notes (Signed)
Physical Therapy Session Note ? ?Patient Details  ?Name: Isaiah Pena ?MRN: [id number] ?Date of Birth: 1950/07/28 ? ?Today's Date: 05/27/2021 ?PT Individual Time: 3790-2409 ?PT Individual Time Calculation (min): 76 min  ? ?Short Term Goals: ?Week 1:  PT Short Term Goal 1 (Week 1): =LTG due to ELOS ? ?Skilled Therapeutic Interventions/Progress Updates:  ?  pt received in bed and agreeable to therapy. No complaint of pain. Pt's wife, Alyse Low, present throughout session. Initiated session with rapport building and discussing pt and caregiver goals for rehab stay. Pt reports goal of being able to help out around the house, and Alyse Low states goal of feeling confident with stair navigation.  ? ?Pt propelled w/c with BUE 10 ft with instruction on technique for efficiency and improved navigation. Pt then ambulated 2X~40 ft with RW and mod A for balance and cueing for longer stride length and increased speed. 2nd trial with meaningful music, noted improvement in stride length. ? ?At this time, pt reported urge to use bathroom, so transported back to room. Stand pivot transfer to commode with min A and RW. No void noted, but pt with gas. Assist with hygiene for thoroughness. Pt then completed hand hygiene in sitting.  ? ?Transitioned to working on standing marching and Sit to stand with less UE support. Pt required min A for Sit to stand throughout, with cueing for improved anterior weight shift and upright posture. Pt remained in w/c, and was left with all needs in reach and alarm active.  ? ?Therapy Documentation ?Precautions:  ?Precautions ?Precautions: Fall ?Precaution Comments: PD at baseline ?Restrictions ?Weight Bearing Restrictions: No ?General: ?  ? ? ? ? ?Therapy/Group: Individual Therapy ? ?[place] ?05/27/2021, 12:10 PM  ?

## 2021-05-27 NOTE — Discharge Summary (Signed)
Physician Discharge Summary  Patient ID: ARVIL UTZ MRN: [id number] DOB/AGE: 08-28-1950 71 y.o.  Admit date: 05/25/2021 Discharge date: 06/05/2021  Discharge Diagnoses:  Principal Problem:   Debility Active problems: Acute kidney injury Parkinson disease Paroxysmal atrial fibrillation Benign prostatic hypertrophy Anxiety Hypertension Constipation Morbid obesity Right cystic renal mass Obstructive sleep apnea  Discharged Condition: good  Significant Diagnostic Studies:  Labs:  Basic Metabolic Panel: Recent Labs  Lab 05/30/21 0620 06/03/21 0807 06/04/21 0537  NA 142 139 144  K 3.8 3.2* 4.2  CL 108 104 107  CO2 '28 28 30  '$ GLUCOSE 111* 122* 114*  BUN '16 16 17  '$ CREATININE 0.82 0.85 0.75  CALCIUM 8.7* 8.6* 8.7*  MG 1.8 1.7  --     CBC: No results for input(s): WBC, NEUTROABS, HGB, HCT, MCV, PLT in the last 168 hours.   CBG: No results for input(s): GLUCAP in the last 168 hours.  Brief HPI:   FINNEAN CERAMI is a 71 y.o. male with a history of BPH and Parkinson disease followed by Dr. Eulogio Ditch who presented with urinary retention. CT abd and pelvis revealed bladder outlet obstruction and Foley catheter placed . Lab work consistent with AKI.   Hospital Course: TZION WEDEL was admitted to rehab 05/25/2021 for inpatient therapies to consist of PT, ST and OT at least three hours five days a week. Past admission physiatrist, therapy team and rehab RN have worked together to provide customized collaborative inpatient rehab. Magnesium and potassium supplements administered.  Parkinson's disease stable. Foley remains in place as per urology. Vital signs remained stable and ready for discharge home in satisfactory condition on 5/17.   Blood pressures were monitored on TID basis and Norvasc and Lopressor continued. Elevated readings therefore benazepril was added which he used in combination with amlodipine prior to admission.  Rehab course: During patient's stay in rehab  weekly team conferences were held to monitor patient's progress, set goals and discuss barriers to discharge. At admission, patient required mod assist with bed mobility and transfers, 2 helpers with RW. Required moderate to max assist with ADLs.  He has had improvement in activity tolerance, balance, postural control as well as ability to compensate for deficits. He has had improvement in functional use RUE/LUE  and RLE/LLE as well as improvement in awareness. Walked 15 feet with RW. Needs transport chair for performing ADLs.    Disposition:  Home Discharge disposition: 01-Home or Self Care      Diet: Regular  Special Instructions:  No driving, alcohol consumption or tobacco use.   30-35 minutes were spent on discharge planning and discharge summary.   Discharge Instructions     Ambulatory referral to Physical Medicine Rehab   Complete by: As directed    Hospital follow-up      Allergies as of 06/05/2021       Reactions   Sulfa Antibiotics Hives        Medication List     TAKE these medications    acetaminophen 650 MG CR tablet Commonly known as: TYLENOL Take 2 tablets (1,300 mg total) by mouth every 8 (eight) hours as needed for pain.   amantadine 100 MG capsule Commonly known as: SYMMETREL Take 100 mg by mouth See admin instructions. Take one capsule (100 mg) by mouth daily at bedtime - 1am   atorvastatin 10 MG tablet Commonly known as: LIPITOR TAKE 1/2 TABLET BY MOUTH EVERY OTHER DAY AND ALTERNATING 1 TABLET BY MOUTH EVERY OTHER DAY. Please keep upcoming  appt in April 2023 before anymore refills. Thank you What changed:  how much to take how to take this when to take this additional instructions   carbidopa-levodopa 25-100 MG tablet Commonly known as: SINEMET IR Take 3 tablets by mouth every 8 (eight) hours. 8am, 5pm and 1am   clonazePAM 0.5 MG tablet Commonly known as: KLONOPIN Take 0.25 mg by mouth 2 (two) times daily as needed for anxiety.    COLACE PO Take 2 tablets by mouth 2 (two) times daily as needed (constipation).   entacapone 200 MG tablet Commonly known as: COMTAN Take 200 mg by mouth every 8 (eight) hours. 9am, 5pm, 1am   esomeprazole 20 MG capsule Commonly known as: NEXIUM Take 20 mg by mouth every other day.   finasteride 5 MG tablet Commonly known as: PROSCAR Take 1 tablet (5 mg total) by mouth daily. Start taking on: Jun 06, 2021   magnesium gluconate 500 MG tablet Commonly known as: MAGONATE Take 1 tablet (500 mg total) by mouth 2 (two) times daily.   metoprolol tartrate 50 MG tablet Commonly known as: LOPRESSOR Take 50 mg by mouth 2 (two) times daily.   polyethylene glycol 17 g packet Commonly known as: MIRALAX / GLYCOLAX Take 17 g by mouth daily.   potassium chloride SA 20 MEQ tablet Commonly known as: KLOR-CON M Take 1 tablet (20 mEq total) by mouth every other day.   senna-docusate 8.6-50 MG tablet Commonly known as: Senokot-S Take 1 tablet by mouth 2 (two) times daily.   tadalafil 5 MG tablet Commonly known as: CIALIS Take 5 mg by mouth every evening.   tolterodine 4 MG 24 hr capsule Commonly known as: DETROL LA Take 4 mg by mouth every evening.   traMADol 50 MG tablet Commonly known as: ULTRAM Take 50 mg by mouth 3 (three) times daily as needed (pain).   Xarelto 20 MG Tabs tablet Generic drug: rivaroxaban TAKE 1 TABLET BY MOUTH EVERY EVENING WITH SUPPER What changed: See the new instructions.        Follow-up Information     Mayra Neer, MD Follow up.   Specialty: Family Medicine Why: Call tomorrow to make arrangments for hosptial follow-up appointment Contact information: [address] [address] [phone]         Sueanne Margarita, MD .   Specialty: Cardiology Contact information: [phone] [address] [place] [address] [phone]         Franchot Gallo, MD. Call.   Specialty: Urology Why: Call tomorrow  to make arrangements for hospital follow-up appointment Contact information: [place] [address] [phone]                 Signed: Barbie Banner 06/05/2021, 10:49 AM

## 2021-05-27 NOTE — Progress Notes (Signed)
Inpatient Rehabilitation Center ?Individual Statement of Services ? ?Patient Name:  Isaiah Pena  ?Date:  05/27/2021 ? ?Welcome to the [facility].  Our goal is to provide you with an individualized program based on your diagnosis and situation, designed to meet your specific needs.  With this comprehensive rehabilitation program, you will be expected to participate in at least 3 hours of rehabilitation therapies Monday-Friday, with modified therapy programming on the weekends. ? ?Your rehabilitation program will include the following services:  Physical Therapy (PT), Occupational Therapy (OT), 24 hour per day rehabilitation nursing, Therapeutic Recreaction (TR), Care Coordinator, Rehabilitation Medicine, Nutrition Services, and Pharmacy Services ? ?Weekly team conferences will be held on Wednesday to discuss your progress.  Your Inpatient Rehabilitation Care Coordinator will talk with you frequently to get your input and to update you on team discussions.  Team conferences with you and your family in attendance may also be held. ? ?Expected length of stay: 7-10 days  Overall anticipated outcome: supervision-min level of assist ? ?Depending on your progress and recovery, your program may change. Your Inpatient Rehabilitation Care Coordinator will coordinate services and will keep you informed of any changes. Your Inpatient Rehabilitation Care Coordinator's name and contact numbers are listed  below. ? ?The following services may also be recommended but are not provided by the [facility]:  ? ?Home Health Rehabiltiation Services ?Outpatient Rehabilitation Services ? ?  ?Arrangements will be made to provide these services after discharge if needed.  Arrangements include referral to agencies that provide these services. ? ?Your insurance has been verified to be:  Medicare & Commerical ?Your primary doctor is:  Mayra Neer ? ?Pertinent information will be shared with your  doctor and your insurance company. ? ?Inpatient Rehabilitation Care Coordinator:  Ovidio Kin, [place] or (C) [phone] ? ?Information discussed with and copy given to patient by: Elease Hashimoto, 05/27/2021, 1:44 PM    ?

## 2021-05-27 NOTE — Progress Notes (Signed)
Physical Therapy Session Note ? ?Patient Details  ?Name: Isaiah Pena ?MRN: [id number] ?Date of Birth: [birth year] ? ?Today's Date: 05/27/2021 ?PT Individual Time: 3790-2409 ?PT Individual Time Calculation (min): 55 min  ? ?Short Term Goals: ?Week 1:  PT Short Term Goal 1 (Week 1): =LTG due to ELOS ? ?Skilled Therapeutic Interventions/Progress Updates:  ?  pt received in bed and agreeable to therapy. Pt states he felt disoriented and nauseous, when he woke up from his nap, but feels much better now. Supine>sit with supervision, VC and increased time. Scooting to EOB with instructional cueing and min A. Sit to stand and ambulatory transfer to w/c with min A, tot A to manage catheter. Pt transported to therapy gym for time management and energy conservation. Pt navigated 3 x 4 steps with seated rest break and min A for balance and cueing. 1 posterior LOB noted with min A to correct. Discussed pt's home set up and best technique, with pt using lateral technique and L hand rail. Pt then ambulated 2 x ~80 ft with RW and mod A for balance, and VC to overcome shuffling gait pattern. Pt able to maintain longer step length and cadence with cueing but returns to shuffling when turning and crossing thresholds. On returning to room, pt ambulated into bathroom. CGA for clothing management. Pt then handed off to NT at end of session to complete toileting.  ? ?Therapy Documentation ?Precautions:  ?Precautions ?Precautions: Fall ?Precaution Comments: PD at baseline ?Restrictions ?Weight Bearing Restrictions: No ?General: ?  ? ? ? ? ?Therapy/Group: Individual Therapy ? ?[place] ?05/27/2021, 3:35 PM  ?

## 2021-05-27 NOTE — Progress Notes (Signed)
Inpatient Rehabilitation  Patient information reviewed and entered into eRehab system by Ladaija Dimino M. Aritzel Krusemark, M.A., CCC/SLP, PPS Coordinator.  Information including medical coding, functional ability and quality indicators will be reviewed and updated through discharge.    

## 2021-05-27 NOTE — Progress Notes (Signed)
Occupational Therapy Session Note ? ?Patient Details  ?Name: BARNIE SOPKO ?MRN: [id number] ?Date of Birth: 1950/03/04 ? ?Today's Date: 05/27/2021 ?OT Individual Time: 9163-8466 ?OT Individual Time Calculation (min): 75 min  ? ? ?Short Term Goals: ?Week 1:  OT Short Term Goal 1 (Week 1): STGs=LTGs due to ELOS ? ?Skilled Therapeutic Interventions/Progress Updates:  ? ?Pt seen for OT session this AM with wife present for session for history and care coordination. Pt motivated this session with all ADL activity presented and novel AE for LB dressing using reacher. OT assisted with all management of Foley catheter bag for transfers, bathing and dressing as well as transporting on w/c safely in and out of bathroom. Pt bed level upon OT arrival and provided mod A for supine to sit to EOB due to pt report of overall body stiffness and effects of PD. EOB to w/c via SPT with mod A with increased time and cues for LE sequencing and hand placement. Transfer off and on tub transfer bench in stall shower with mod A, cues and use of grab bars for support. Pt requested to use commode with elevated seat for BM with mod A for transfer. Cues for B hand and foot positioning and forward weight shifts. Bathing with mod A UE and max A for LB bathing. Dressing UE with min A after s/u and LE dressing with max A with introduction to training for lightweight shorts using reacher for garment management. Standing level mouth care with mod A fading to min a once seated. Slow initiation of coordination tasks ie tooth paste top manipulation overall. Pt left in w/c awaiting next therapy session scheduled with call bell, chair alarm and needs in place at end of session. ?   ? ?Therapy Documentation ?Precautions:  ?Precautions ?Precautions: Fall ?Precaution Comments: PD at baseline ?Restrictions ?Weight Bearing Restrictions: No ?General: ?  ?Vital Signs: ? ?Pain: ?Pain Assessment ?Pain Scale: 0-10 ?Pain Score: 0-No pain ? ? ? ?Therapy/Group: Individual  Therapy ? ?Barnabas Lister ?05/27/2021, 12:55 PM ?

## 2021-05-27 NOTE — Progress Notes (Signed)
Inpatient Rehabilitation Care Coordinator ?Assessment and Plan ?Patient Details  ?Name: Isaiah Pena ?MRN: [id number] ?Date of Birth: 1950/05/16 ? ?Today's Date: 05/27/2021 ? ?Hospital Problems: Principal Problem: ?  Debility ? ?Past Medical History:  ?Past Medical History:  ?Diagnosis Date  ? Anticoagulant long-term use   ? xarelto  ? Bilateral lower extremity edema   ? BPH (benign prostatic hyperplasia)   ? BPH (benign prostatic hyperplasia)   ? Central serous retinopathy   ? LOSS OF CENTER VISION LEFT EYE W/ BLURRED VISION-  was treated  methotrexate by specialist at [facility] and released  ? Coronary artery disease cardiologist-  dr Tressia Miners turner  ? 01/ 2004  abnormal cardiolite  s/p  cardiac cath w/ PCI and DES to dRCA  ? Edema of extremities   ? CHRONIC   ? Erectile dysfunction   ? GERD (gastroesophageal reflux disease)   ? History of colon polyps   ? 2011 per pt benign  ? History of kidney stones   ? History of simple renal cyst   ? drained via radiology  ? History of torn meniscus of right knee   ? Hyperlipidemia   ? Hypertension   ? Kidney stone   ? HX OF STONE-PASSED   ? Lower urinary tract symptoms (LUTS)   ? OSA (obstructive sleep apnea) 09/29/2011  ? NPSG 2004:  AHI 25/hr  Rx with bilevel.     ? OSA treated with BiPAP   ? moderate per study 01-23-2002  ? Parkinson's disease ([place])   ? neurologist-  dr ed hill ([facility])  ? Permanent atrial fibrillation ([place])   ? Persistent atrial fibrillation ([place]) 11/08/2012  ? S/P drug eluting coronary stent placement   ? 01/ 2004  x1 to dRCA  ? ?Past Surgical History:  ?Past Surgical History:  ?Procedure Laterality Date  ? CARDIOVASCULAR STRESS TEST  05-07-2015   dr Tressia Miners turner  ? normal nuclear study w/ no ischemia/  normal LV function and wall motion , stress ef 55%  ? COLONOSCOPY WITH PROPOFOL  2011  ? CORONARY ANGIOPLASTY WITH STENT PLACEMENT  02-11-2002  dr Daneen Schick  ? abnormal cardiolite--  PCI and DES to dRCA (99%),  pRCA luminal irregularities, mild to  moderate LAD and CFX disease,  normal LVF  ? CYSTOSCOPY WITH INSERTION OF UROLIFT N/A 01/28/2016  ? Procedure: CYSTOSCOPY WITH INSERTION OF UROLIFT;  Surgeon: Franchot Gallo, MD;  Location: [facility];  Service: Urology;  Laterality: N/A;  ? HERNIA REPAIR    ? AGE 15  ? KNEE ARTHROSCOPY  03/21/2011  ? Procedure: ARTHROSCOPY KNEE;  Surgeon: Tobi Bastos, MD;  Location: [facility];  Service: Orthopedics;  Laterality: Right;  ? ?Social History:  reports that he has never smoked. He has never used smokeless tobacco. He reports that he does not drink alcohol and does not use drugs. ? ?Family / Support Systems ?Marital Status: Married ?Patient Roles: Spouse, Parent ?Spouse/Significant Other: [name] [phone] ?Children: [name] ?Other Supports: Friends and church members ?Anticipated Caregiver: [name] and son ?Ability/Limitations of Caregiver: no limitations ?Caregiver Availability: 24/7 ?Family Dynamics: Close knit with family and can depend upon them to assist him if needed. He feels he has good supports via family and friends ? ?Social History ?Preferred language: English ?Religion: Methodist ?Cultural Background: No issues ?Education: College ?Health Literacy - How often do you need to have someone help you when you read instructions, pamphlets, or other written material from your doctor or pharmacy?: Never ?Writes: Yes ?Employment  Status: Retired ?Legal History/Current Legal Issues: No issues ?Guardian/Conservator: none-accordng to MD pt is capable of making his own decisions while here. His wife plans to be here daily and observe in therapies  ? ?Abuse/Neglect ?Abuse/Neglect Assessment Can Be Completed: Yes ?Physical Abuse: Denies ?Verbal Abuse: Denies ?Sexual Abuse: Denies ?Exploitation of patient/patient's resources: Denies ?Self-Neglect: Denies ? ?Patient response to: ?Social Isolation - How often do you feel lonely or isolated from those around you?: Never ? ?Emotional Status ?Pt's affect, behavior  and adjustment status: Pt is motivated to do well and get back to his prior level of functioning before the bladder issues/UTI. He does his best and hopes to be mobile when he is discharged. Wife is a strong support of pt ?Recent Psychosocial Issues: other health issues-Parkinsons' ?Psychiatric History: No history seems to be coping appropriately and able to verbalize his feelings and concerns ?Substance Abuse History: No issues ? ?Patient / Family Perceptions, Expectations & Goals ?Pt/Family understanding of illness & functional limitations: Pt and wife can explain his hospitalization and issues with his bladder and now his decline in function. Both do talk with the MD and feel thier questions and concerns are being addressed. ?Premorbid pt/family roles/activities: Husband, father, retiree, church member, etc ?Anticipated changes in roles/activities/participation: resume ?Pt/family expectations/goals: Pt states: " I hope to be mobile when I leave here."  Wife states: " I hope he does well here, but will assist him at discharge." ? ?Community Resources ?Community Agencies: None ?Premorbid Home Care/DME Agencies: Other (Comment) (rollator, cane, tub seat) ?Transportation available at discharge: wife ?Is the patient able to respond to transportation needs?: Yes ?In the past 12 months, has lack of transportation kept you from medical appointments or from getting medications?: No ?In the past 12 months, has lack of transportation kept you from meetings, work, or from getting things needed for daily living?: No ? ?Discharge Planning ?Living Arrangements: Spouse/significant other ?Support Systems: Spouse/significant other, Children, Other relatives, Friends/neighbors, Church/faith community ?Type of Residence: Private residence ?Insurance Resources: [employer], [employer] (specify) (Commerical secondary) ?Financial Resources: Fish farm manager, Family Support ?Financial Screen Referred: No ?Living Expenses: Own ?Money  Management: Spouse ?Does the patient have any problems obtaining your medications?: No ?Home Management: wife ?Patient/Family Preliminary Plans: Return home with wife who has assisted him in the past and will again. He relies upon her to push him and support him. She plans to be here daily and provide support to him and see him in therapies. ?Care Coordinator Anticipated Follow Up Needs: HH/OP ? ?Clinical Impression ?Pleasant gentleman who is motivated to do well here and glad to be here on rehab. His wife is supportive and involved and will assist him at discharge. Will update once team conference on Wed. ? ?Elease Hashimoto ?05/27/2021, 1:43 PM ? ?  ?

## 2021-05-27 NOTE — Plan of Care (Signed)
?  Problem: Consults ?Goal: RH GENERAL PATIENT EDUCATION ?Description: See Patient Education module for education specifics. ?Outcome: Progressing ?Goal: Skin Care Protocol Initiated - if Braden Score 18 or less ?Description: If consults are not indicated, leave blank or document N/A ?Outcome: Progressing ?Goal: Nutrition Consult-if indicated ?Outcome: Progressing ?  ?Problem: RH BOWEL ELIMINATION ?Goal: RH STG MANAGE BOWEL WITH ASSISTANCE ?Description: STG Manage Bowel with Assistance. ?Outcome: Progressing ?  ?Problem: RH BLADDER ELIMINATION ?Goal: RH STG MANAGE BLADDER WITH ASSISTANCE ?Description: STG Manage Bladder With Assistance ?Outcome: Progressing ?  ?Problem: RH SKIN INTEGRITY ?Goal: RH STG SKIN FREE OF INFECTION/BREAKDOWN ?Outcome: Progressing ?  ?Problem: RH SAFETY ?Goal: RH STG ADHERE TO SAFETY PRECAUTIONS W/ASSISTANCE/DEVICE ?Description: STG Adhere to Safety Precautions With Assistance/Device. ?Outcome: Progressing ?  ?Problem: RH PAIN MANAGEMENT ?Goal: RH STG PAIN MANAGED AT OR BELOW PT'S PAIN GOAL ?Outcome: Progressing ?  ?Problem: RH KNOWLEDGE DEFICIT GENERAL ?Goal: RH STG INCREASE KNOWLEDGE OF SELF CARE AFTER HOSPITALIZATION ?Outcome: Progressing ?  ?

## 2021-05-28 DIAGNOSIS — R5381 Other malaise: Secondary | ICD-10-CM | POA: Diagnosis not present

## 2021-05-28 MED ORDER — LIDOCAINE 5 % EX PTCH
2.0000 | MEDICATED_PATCH | CUTANEOUS | Status: DC
  Administered 2021-05-28 – 2021-06-04 (×5): 2 via TRANSDERMAL
  Filled 2021-05-28 (×6): qty 2

## 2021-05-28 MED ORDER — MAGNESIUM SULFATE 4 GM/100ML IV SOLN
4.0000 g | Freq: Once | INTRAVENOUS | Status: AC
  Administered 2021-05-28: 4 g via INTRAVENOUS
  Filled 2021-05-28: qty 100

## 2021-05-28 MED ORDER — POTASSIUM CHLORIDE CRYS ER 20 MEQ PO TBCR
40.0000 meq | EXTENDED_RELEASE_TABLET | Freq: Two times a day (BID) | ORAL | Status: AC
  Administered 2021-05-28 (×2): 40 meq via ORAL
  Filled 2021-05-28 (×2): qty 2

## 2021-05-28 NOTE — Progress Notes (Signed)
Physical Therapy Session Note ? ?Patient Details  ?Name: Isaiah Pena ?MRN: [id number] ?Date of Birth: [birth year] ? ?Today's Date: 05/28/2021 ?PT Individual Time: 7616-0737 ?PT Individual Time Calculation (min): 64 min  ? ?Short Term Goals: ?Week 1:  PT Short Term Goal 1 (Week 1): =LTG due to ELOS ? ?Skilled Therapeutic Interventions/Progress Updates:  ?  Pt seated in w/c on arrival and agreeable to therapy. Pt reports pain managed with medication, but reports feeling "woozy," lightheaded, nauseous, sweaty. Vitals in sitting: BP= 136/102, then 125/100. HR=78 bpm, O2=98%. Consulted RN, who assessed pt. Pt requesting to use bathroom to see if it improved symptoms. ambulatory transfer to bathroom, cueing for RW proximity and safety. Pt with noted increase tremor from AM session. (+) bowel void, documented in flow sheet. Mod A clothing management and tot A hygiene. Pt returned to w/c after toileting, but opted to return to bed for IV placement and rest d/t feeling unwell. Stand pivot transfer with CGA and VC for safety. Bed mobiltiy with supervision and VC for technique. Pt settled in bed and was left with all needs in reach and alarm active. Pt missed 11 min scheduled therapy d/t feeling unwell.  ? ?Therapy Documentation ?Precautions:  ?Precautions ?Precautions: Fall ?Precaution Comments: PD at baseline ?Restrictions ?Weight Bearing Restrictions: No ?General: ?PT Amount of Missed Time (min): 11 Minutes ?PT Missed Treatment Reason: Patient ill (Comment) (lightheaded, nausea) ? ? ? ?Therapy/Group: Individual Therapy ? ?[place] ?05/28/2021, 2:10 PM  ?

## 2021-05-28 NOTE — Patient Care Conference (Signed)
Inpatient RehabilitationTeam Conference and Plan of Care Update ?Date: 05/28/2021   Time: 11:37 AM  ? ? ?Patient Name: Isaiah Pena      ?Medical Record Number: [id number]  ?Date of Birth: [birth year] ?Sex: Male         ?Room/Bed: 4W10C/4W10C-01 ?Payor Info: Payor: MEDICARE / Plan: MEDICARE PART A AND B / Product Type: *No Product type* /   ? ?Admit Date/Time:  05/25/2021  3:18 PM ? ?Primary Diagnosis:  Debility ? ?Hospital Problems: Principal Problem: ?  Debility ? ? ? ?Expected Discharge Date: Expected Discharge Date: 06/05/21 ? ?Team Members Present: ?Physician leading conference: Dr. Courtney Heys ?Social Worker Present: Ovidio Kin, LCSW ?Nurse Present: Dorthula Nettles, RN ?PT Present: Ailene Rud, PT ?OT Present: Jamey Ripa, OT ?PPS Coordinator present : Gunnar Fusi, SLP ? ?   Current Status/Progress Goal Weekly Team Focus  ?Bowel/Bladder ? ? continent of bowel, chronuc fole16 fr. Last BM-5/7  remain continent of bowel  assess q shift and Foley care q shift   ?Swallow/Nutrition/ Hydration ? ?           ?ADL's ? ? UB dressing and bathing mod A, LB bathing and dressing max A, toileting for BM max A as pt has Foley for urine, Grooming with min A  Set up for UB self care, Min A LB bathing and dressing, close s toileting, supervision bathroom transfers  ADL retraining, functional mobility/transfer training for bathroom incl toilet/shower bench, standing tolerance and balance for functional ADL's, AE training   ?Mobility ? ? min- mod A gait, min A transfers, min A stairs, min A w/c mobility  supervision gait and transfers, min A stairs  gait, PD strategies, stairs, balance   ?Communication ? ?           ?Safety/Cognition/ Behavioral Observations ?           ?Pain ? ? c/o chronic back pain. Pain manag. in progress, effective  pain<3  assess pain q shift and PRN   ?Skin ? ? MASD to groin area and sacrum. Boil/blister to sacrum  proper healing  assess skin q shift and PRN   ? ? ?Discharge Planning:  ?Home with wife  who is very involved and here often. She is here daily and will do hands on when needed. Aware will need care at DC   ?Team Discussion: ?Magnesium 1.4, replete with IV Mag. K+ low, supplement. Lidocaine patches in place. Continue Foley, follow-up with Urology post discharge. Continent bowel. Tramadol PRN for pain. Discharging home with spouse. Has all needed equipment. ? ?Patient on target to meet rehab goals: ?yes, supervision/min assist goals. Ambulating well. Working on stability/safety. Mod assist car transfer, at baseline. Showered with mod assist lower body, min assist upper body. ? ?*See Care Plan and progress notes for long and short-term goals.  ? ?Revisions to Treatment Plan:  ?Monitoring labs, adjusting medications. ?  ?Teaching Needs: ?Family education, medication/pain management, transfer/gait training, etc. ?  ?Current Barriers to Discharge: ?Decreased caregiver support, Home enviroment access/layout, Lack of/limited family support, Weight, and Foley. ? ?Possible Resolutions to Barriers: ?Family education ?Follow-up with Urology ?Follow-up therapy ?  ? ? Medical Summary ?Current Status: continent bowel- has tramadol prn- adding lidoderm patches for back pain- no skin issues ? Barriers to Discharge: Weight;Home enviroment access/layout;Medical stability;Other (comments) ? Barriers to Discharge Comments: wife to care for 24/7 at home- needs IV Mg 1.4 and KCL repletion- trying to control pain better ?Possible Resolutions to [employer] Focus: goals supervision/min A-  needs AE- - 5/17- d/c- goals to control back pain to improve function- and foley until f/u with urology ? ? ?Continued Need for Acute Rehabilitation Level of Care: The patient requires daily medical management by a physician with specialized training in physical medicine and rehabilitation for the following reasons: ?Direction of a multidisciplinary physical rehabilitation program to maximize functional independence : Yes ?Medical  management of patient stability for increased activity during participation in an intensive rehabilitation regime.: Yes ?Analysis of laboratory values and/or radiology reports with any subsequent need for medication adjustment and/or medical intervention. : Yes ? ? ?I attest that I was present, lead the team conference, and concur with the assessment and plan of the team. ? ? ?Dorthula Nettles G ?05/28/2021, 4:18 PM  ? ? ? ? ? ? ?

## 2021-05-28 NOTE — Progress Notes (Signed)
?                                                       PROGRESS NOTE ? ? ?Subjective/Complaints: ? ?Pt reports cannot get comfortable at night due to back pain and overall aches and pains- only likes to take tramadol- asked for nursing to bring him before therapy this AM.  ?K+ 3.3- will replete ? ?Foley to be in until d/c.  ?Discussed options to treat back pain- decided to try Lidoderm patches in addition to current regimen.  ?.   ? ?ROS:  ?Pt denies SOB, abd pain, CP, N/V/C/D, and vision changes ? ? ?Objective: ?  ?No results found. ?Recent Labs  ?  05/27/21 ?4098  ?WBC 7.9  ?HGB 15.3  ?HCT 45.2  ?PLT 175  ? ?Recent Labs  ?  05/27/21 ?1191  ?NA 143  ?K 3.3*  ?CL 104  ?CO2 32  ?GLUCOSE 122*  ?BUN 15  ?CREATININE 0.73  ?CALCIUM 8.5*  ? ? ?Intake/Output Summary (Last 24 hours) at 05/28/2021 0834 ?Last data filed at 05/28/2021 0810 ?Gross per 24 hour  ?Intake 240 ml  ?Output 2525 ml  ?Net -2285 ml  ?  ? ?  ? ?Physical Exam: ?Vital Signs ?Blood pressure (!) 138/94, pulse 84, temperature 97.7 ?F (36.5 ?C), temperature source Oral, resp. rate 20, height '5\' 9"'$  (1.753 m), weight 118 kg, SpO2 97 %. ? ? ? ?General: awake, alert, appropriate, laying in bed; long beard; NAD ?HENT: conjugate gaze; oropharynx moist ?CV: regular rate; no JVD ?Pulmonary: CTA B/L; no W/R/R- good air movement ?GI: soft, NT, ND, (+)BS- protuberant ?Psychiatric: appropriate ?Neurological: Ox3 ?GU- foley in place- attached correctly to leg ?Extremities: No clubbing, cyanosis, or edema. Pulses are 2+ ?Psych: Pt's affect is appropriate. Pt is cooperative ?Skin: Clean and intact without signs of breakdown, chronic vasc changes in LE's ?Neuro:  Alert and oriented x 3. Normal insight and awareness. Intact Memory. Normal language and speech. Cranial nerve exam unremarkable. Masked facies. Mild cogwheeling in UE and Le's. Motor 4-5/5 in UE and 3-4/5 in LE's. No focal sensory findings ?Musculoskeletal: mild pain with ROM of hips and shoulder. Fair sitting posture   ? ? ?Assessment/Plan: ?1. Functional deficits which require 3+ hours per day of interdisciplinary therapy in a comprehensive inpatient rehab setting. ?Physiatrist is providing close team supervision and 24 hour management of active medical problems listed below. ?Physiatrist and rehab team continue to assess barriers to discharge/monitor patient progress toward functional and medical goals ? ?Care Tool: ? ?Bathing ?   ?Body parts bathed by patient: Right arm, Left arm, Chest, Abdomen, Face  ? Body parts bathed by helper: Right upper leg, Left upper leg, Right lower leg, Left lower leg, Buttocks ?  ?  ?Bathing assist Assist Level: Moderate Assistance - Patient 50 - 74% ?  ?  ?Upper Body Dressing/Undressing ?Upper body dressing   ?What is the patient wearing?: Pull over shirt ?   ?Upper body assist Assist Level: Moderate Assistance - Patient 50 - 74% ?   ?Lower Body Dressing/Undressing ?Lower body dressing ? ? ?   ?What is the patient wearing?: Incontinence brief, Pants ? ?  ? ?Lower body assist Assist for lower body dressing: Maximal Assistance - Patient 25 - 49% ?   ? ?Toileting ?Toileting    ?Toileting assist  Assist for toileting: Minimal Assistance - Patient > 75% ?  ?  ?Transfers ?Chair/bed transfer ? ?Transfers assist ?   ? ?Chair/bed transfer assist level: Minimal Assistance - Patient > 75% ?  ?  ?Locomotion ?Ambulation ? ? ?Ambulation assist ? ?   ? ?Assist level: Moderate Assistance - Patient 50 - 74% ?Assistive device: Walker-rolling ?Max distance: 25  ? ?Walk 10 feet activity ? ? ?Assist ?   ? ?Assist level: Moderate Assistance - Patient - 50 - 74% ?Assistive device: Walker-rolling  ? ?Walk 50 feet activity ? ? ?Assist Walk 50 feet with 2 turns activity did not occur: Safety/medical concerns ? ?Assist level: Moderate Assistance - Patient - 50 - 74% ?Assistive device: Walker-rolling  ? ? ?Walk 150 feet activity ? ? ?Assist Walk 150 feet activity did not occur: Safety/medical concerns ? ?  ?  ?  ? ?Walk 10  feet on uneven surface  ?activity ? ? ?Assist Walk 10 feet on uneven surfaces activity did not occur: Safety/medical concerns ? ? ?  ?   ? ?Wheelchair ? ? ? ? ?Assist Is the patient using a wheelchair?: Yes ?Type of Wheelchair: Manual ?  ? ?Wheelchair assist level: Dependent - Patient 0% ?Max wheelchair distance: 150'  ? ? ?Wheelchair 50 feet with 2 turns activity ? ? ? ?Assist ? ?  ?  ? ? ?Assist Level: Dependent - Patient 0%  ? ?Wheelchair 150 feet activity  ? ? ? ?Assist ?   ? ? ?Assist Level: Dependent - Patient 0%  ? ?Blood pressure (!) 138/94, pulse 84, temperature 97.7 ?F (36.5 ?C), temperature source Oral, resp. rate 20, height '5\' 9"'$  (1.753 m), weight 118 kg, SpO2 97 %. ? ?Medical Problem List and Plan: ?1. Functional deficits secondary to pseudo-exacerbation of parkinson's disease/disability ?            -patient may shower ?            -ELOS/Goals: 7-10 days supervision to min assist goals ? Continue CIR- PT, OT  ? Team conference today and IPOC to determine length of stay ?2.  Antithrombotics: ?-DVT/anticoagulation:  Pharmaceutical: Xarelto ?            -antiplatelet therapy: n/a ?3. Pain Management: tramadol and tylenol prn. Appears fairly well managed at present ?            -monitor activity tolerance ? 5/9- will add lidoderm patches 8pm to 8am- advised pt to have nursing remove in AM ?4. Mood: team to provide ego support ?            -klonopin prn for anxiety ?            -antipsychotic agents: n/a ?5. Neuropsych: This patient is capable of making decisions on his own behalf. ?6. Skin/Wound Care: local skin, foley care ?7. Fluids/Electrolytes/Nutrition: encourage appropriate nutrition ?            -still with hypomagnesia--continue oral supplementation  ?8. Parkinson's Disease: ?            -amantadine, sinemet, comtan per baseline regimen ? -seems fairly well compensated ?9. AKI/Urine retention/BPH: ?            -Foley in place and will follow up with urology ([place]) in about 2 weeks ?             -finasteride and fesoterodine per urology ? 5/9- Urology wants foley to remain in place until d/c.  ?10. HTN: 5/7 remains quite elevate ?            -  continue norvasc, lopressor  ?            -add in benazepril which he used in combo with amlodipine pta  ?11. Slow transit constipation: ?            -scheduled miralax and senna-s ?            -prn's ordered ?12. Hx of CAD/persistent A -fib --lopressor, lipitor, xarelto ?            -HR borderline at present ?            5/7 -Mg++ still only 1.6 but only received one dose so far of mag gluc '500mg'$  bid ?  -consider IVPB Mg++ 5/8 if still in the 1.5-6 range ?  -will add daily potassium as well as it's low as well ? 5/9- will replete K+ 40 mEq x2 and since Mg 1.4- will give IV Mg and recheck Thursday ? ?13. OSA: not currently using bipap ?14. Morbid obesity   ?15. Right Renal mass : c/w cystic lesion on MRI ?  ? ?I spent a total of 51   minutes on total care today- >50% coordination of care- due to IPOC, team conference to determine length of stay and prolonged d/w with pt about pain as well as calling pharmayc about Mg repletion.  ? ? ?LOS: ?3 days ?A FACE TO FACE EVALUATION WAS PERFORMED ? ?Finbar Nippert ?05/28/2021, 8:34 AM  ? ?  ?

## 2021-05-28 NOTE — IPOC Note (Signed)
Overall Plan of Care (IPOC) ?Patient Details ?Name: Isaiah Pena ?MRN: [id number] ?DOB: 01/24/1950 ? ?Admitting Diagnosis: Debility ? ?Hospital Problems: Principal Problem: ?  Debility ? ? ? ? Functional Problem List: ?Nursing Bladder, Bowel, Edema, Endurance, Nutrition, Perception, Safety, Sensory, Skin Integrity  ?PT Balance, Endurance, Pain, Safety  ?OT Balance, Safety, Endurance, Motor  ?SLP    ?TR    ?    ? Basic ADL?s: ?OT Eating, Grooming, Bathing, Dressing, Toileting  ? ?  Advanced  ADL?s: ?OT    ?   ?Transfers: ?PT Bed Mobility, Bed to Chair, Car, Furniture, Floor  ?OT Toilet, Tub/Shower  ? ?  Locomotion: ?PT Ambulation, Wheelchair Mobility, Stairs  ? ?  Additional Impairments: ?OT Fuctional Use of Upper Extremity  ?SLP   ?  ?   ?TR    ? ? ?Anticipated Outcomes ?Item Anticipated Outcome  ?Self Feeding supervision  ?Swallowing ?   ?  ?Basic self-care ? min assist  ?Toileting ? min assist ?  ?Bathroom Transfers supervision  ?Bowel/Bladder ? mod I  ?Transfers ? Supervision with LRAD  ?Locomotion ? Supervision at ambulatory level with LRAD  ?Communication ?    ?Cognition ?    ?Pain ? pain less than 2  ?Safety/Judgment ? mod I  ? ?Therapy Plan: ?PT Intensity: Minimum of 1-2 x/day ,45 to 90 minutes ?PT Frequency: 5 out of 7 days ?PT Duration Estimated Length of Stay: 7-10 days ?OT Intensity: Minimum of 1-2 x/day, 45 to 90 minutes ?OT Frequency: 5 out of 7 days ?OT Duration/Estimated Length of Stay: 7-10 days ?   ? ?Due to the current state of emergency, patients may not be receiving their 3-hours of Medicare-mandated therapy. ? ? Team Interventions: ?Nursing Interventions Patient/Family Education, Skin Care/Wound Management, Psychosocial Support, Bladder Management, Disease Management/Prevention, Medication Management, Discharge Planning  ?PT interventions Ambulation/gait training, Training and development officer, Community reintegration, Discharge planning, Disease management/prevention, DME/adaptive equipment  instruction, Functional mobility training, Neuromuscular re-education, Pain management, Patient/family education, Psychosocial support, Stair training, Therapeutic Activities, Therapeutic Exercise, UE/LE Strength taining/ROM, UE/LE Coordination activities, Wheelchair propulsion/positioning  ?OT Interventions Balance/vestibular training, Discharge planning, Functional electrical stimulation, Pain management, Self Care/advanced ADL retraining, Therapeutic Activities, UE/LE Coordination activities, Therapeutic Exercise, Skin care/wound managment, Patient/family education, Functional mobility training, Disease mangement/prevention, Community reintegration, DME/adaptive equipment instruction, Neuromuscular re-education, Psychosocial support, Splinting/orthotics, UE/LE Strength taining/ROM, Wheelchair propulsion/positioning  ?SLP Interventions    ?TR Interventions    ?SW/CM Interventions Discharge Planning, Psychosocial Support, Patient/Family Education  ? ?Barriers to Discharge ?MD  Medical stability, Home enviroment access/loayout, Wound care, Lack of/limited family support, and Weight  ?Nursing Home environment access/layout ?   ?PT   ?   ?OT Weight, Neurogenic Bowel & Bladder ?   ?SLP   ?   ?SW   ?   ? ?Team Discharge Planning: ?Destination: PT-Home ,OT- Home , SLP-  ?Projected Follow-up: PT-Home health PT, OT-  Home health OT, 24 hour supervision/assistance, SLP-  ?Projected Equipment Needs: PT-Rolling walker with 5" wheels, To be determined, OT- To be determined, SLP-  ?Equipment Details: PT-TBD pending progress, pt owns RW, SPC, and rollator, OT-  ?Patient/family involved in discharge planning: PT- Patient, Family member/caregiver,  OT-Patient, Family member/caregiver, SLP-  ? ?MD ELOS: 7-10 days ?Medical Rehab Prognosis:  Good ?Assessment: The patient has been admitted for CIR therapies with the diagnosis of debility/PD exacerbation. The team will be addressing functional mobility, strength, stamina, balance,  safety, adaptive techniques and equipment, self-care, bowel and bladder mgt, patient and caregiver education, . Goals have  been set at mod I to supervision. Anticipated discharge destination is home. ? ? ? ?  ? ? ?See Team Conference Notes for weekly updates to the plan of care ? ?

## 2021-05-28 NOTE — Progress Notes (Signed)
Physical Therapy Session Note ? ?Patient Details  ?Name: Isaiah Pena ?MRN: [id number] ?Date of Birth: 05-16-1950 ? ?Today's Date: 05/28/2021 ?PT Individual Time: 3086-5784 ?PT Individual Time Calculation (min): 68 min  ? ?Short Term Goals: ?Week 1:  PT Short Term Goal 1 (Week 1): =LTG due to ELOS ? ?Skilled Therapeutic Interventions/Progress Updates:  ?  Pt seated in w/c on arrival and agreeable to therapy. Pt reports pain improving with new medication administered during session by nsg. Pt propelled w/c with BUE and supervision, with slow speed.  Pt instructed on improved push strokes for efficiency. Pt then transported remaining distance to ortho gym for time management. Sit to stand with RW with CGA throughout session, assist to manage catheter throughout. Pt performed car transfer with mod A for BLE management, but reports this is is baseline. Discussed possible adaptations and strategies. At this time pt had to use bathroom. ambulatory transfer to tub room, >40 ft. CGA for transfer, min A clothing management. No void noted but pt requested tot A hygiene d/t gas, smear noted. Pt then ambulated x150 ft with min A for VC for increased step length, improved cadence, and upright posture/gaze. Pt able to recall strategies for improved gait, including music and breathing techniques. At this time while resting, pt became tearful after talking about his daughter's accomplishment. Pt reports lots of emotions regarding his loss of function with PD, especially playing the bass. Pt then returned to room, remaining in w/c, and was left with all needs in reach and alarm active.  ? ?Therapy Documentation ?Precautions:  ?Precautions ?Precautions: Fall ?Precaution Comments: PD at baseline ?Restrictions ?Weight Bearing Restrictions: No ?General: ?  ? ? ? ?Therapy/Group: Individual Therapy ? ?[place] ?05/28/2021, 11:52 AM  ?

## 2021-05-28 NOTE — Progress Notes (Signed)
Patient ID: Isaiah Pena, male   DOB: 11/12/1950, 71 y.o.   MRN: [id number]  Met with pt to inform of team conference goals of supervision-min assist and target discharge date of 5/17. Have left message for Kristy-wife to update also. Will discuss home health versus OP. Pt was being [facility] by [facility] while on acute. Await return call from [place] ?

## 2021-05-28 NOTE — Progress Notes (Signed)
Pt up in chair on IV team arrival, secure chat sent to floor RN to advise when patient back in bed.  ?

## 2021-05-28 NOTE — Progress Notes (Signed)
Occupational Therapy Session Note ? ?Patient Details  ?Name: HERON PITCOCK ?MRN: [id number] ?Date of Birth: 07/24/1950 ? ?Today's Date: 05/28/2021 ?OT Individual Time: 6333-5456 ?OT Individual Time Calculation (min): 35 min  - 15 min therapeutic time of 35 min ? ? ?Short Term Goals: ?Week 1:  OT Short Term Goal 1 (Week 1): STGs=LTGs due to ELOS ? ?Skilled Therapeutic Interventions/Progress Updates:  ?  Patient received reclined supine in bed.  Patient declines to get out of bed due to nausea.  Patient very pleasant and able to recap his goals for therapy.  Patient stating he would like to work on speech - being able to carry over volume through a full sentence.  Patient lacks breath support.  Patient reports nausea after lunch and after PT session.  "She worked me good!"  Patient appears to be eager for functional improvement - just unable to fully participate this afternoon.   ? ?Therapy Documentation ?Precautions:  ?Precautions ?Precautions: Fall ?Precaution Comments: PD at baseline ?Restrictions ?Weight Bearing Restrictions: No ?General: ?General ?OT Amount of Missed Time: 30 Minutes ?PT Missed Treatment Reason: Patient ill (Comment) (lightheaded, nausea) ? ?Pain: ? 0/10 ? ? ? ?Therapy/Group: Individual Therapy ? ?Mariah Milling ?05/28/2021, 2:59 PM ?

## 2021-05-29 DIAGNOSIS — R5381 Other malaise: Secondary | ICD-10-CM | POA: Diagnosis not present

## 2021-05-29 NOTE — Progress Notes (Signed)
Occupational Therapy Session Note ? ?Patient Details  ?Name: Isaiah Pena ?MRN: [id number] ?Date of Birth: 12/21/1950 ? ?Today's Date: 05/29/2021 ?OT Individual Time: 0800-0900 1st visit; 1300-1400 2nd visit; 1500-1530 3rd visit  ?OT Individual Time Calculation (min): 60 min  1st visit; 60 min 2nd visit, 30 min 3rd visit  ? ? ?Short Term Goals: ?Week 1:  OT Short Term Goal 1 (Week 1): STGs=LTGs due to ELOS ? ?Skilled Therapeutic Interventions/Progress Updates:  ? ?1st visit- 800-900 AM: Pt seen for OT session this AM with pt completed breakfast in hospital bed upon OT arrival. Pt open and motivated for AM shower training and self care progression. OT assisted with all management of Foley catheter bag for transfers and management to clip on to RW. Pt was able to utilize RW throughout session to access walk in shower. Pt required mod fading to min  A for supine to sit to EOB with decreased ability to scoot and mobilize self over sheets. EOB for sit to stand improving with cues for forward weight shift and push to stand strategy with min A with increased time and cues for LE sequencing and hand placement. Transfer off and on tub transfer bench in stall shower with min  A, cues and use of grab bars for support. Cues for B hand and foot positioning and forward weight shifts. Bathing with min A UE and mod-max A for LB bathing. Dressing UE with close S A after s/u and LE dressing with mod A with continued training for lightweight shorts using reacher for garment management. Initiated [facility] for slip on tennis shoe management with min A.  Standing level mouth care with RW for support and min A after set up. Pt left in w/c awaiting next therapy session scheduled with call bell, chair alarm and needs all within reach.  ? ?2nd visit-1300-1400 PM:Pt up in w/c after finishing lunch and wife present for session. Pt amb with RW to and from bathroom with contact guard after increased time and effort to sit to stand with cues to push  from w/c and forward weight shift. OT trialed pt to sit directly on toilet with grab bar to simulate more of what home set up reflects and increased success with bowel management. Pt with success and + large BM with mod a for peri hygiene. Overall min A from regular toilet but OT feels B arm rests are safer and more effective. Has bidet at home. OT lowered 3 and 1 commode and placed over top as this way pt has B hand rails to push up with. Stood for hand washing sink side with contact guard A and then returned pt to rest in bed due to fatigue as OT has a 3rd session later. Wife educated on safe transfers, skin protection and BADL strategies throughout session with positive teach back and extremely supportive of care and plan. OT reported to NT [facility] for charting. Left pt bed level with alarm on, needs in place and wife support.  ? ?3rd visit- 1500-1530 PM: Pt awake in bed upon OT arrival. Pt reports he spilled water all over his shirt trying to drink in bed. OT assisted pt with min-mod A to EOB with cues for scooting and pushing up strategies. EOB with min A for doffing wet and donning clean dry pullover shirt. RW for sit to stand with min A/Contact guard from EOB to w/c. Pt was able to self propel intervals of 25 feet x 6 with rests in between with OT  support to access at[facility] of hospital. OT assisted with all turns for elevator management. Cues for breathing and pacing. Returned pt to unit where PA was waiting at bedside and OT handed off to her for session.  ?   ? ?Therapy Documentation ?Precautions:  ?Precautions ?Precautions: Fall ?Precaution Comments: PD at baseline ?Restrictions ?Weight Bearing Restrictions: No ? ? ? ?Therapy/Group: Individual Therapy ? ?Barnabas Lister ?05/29/2021, 3:50 PM ?

## 2021-05-29 NOTE — Progress Notes (Signed)
Patient ID: Isaiah Pena, male   DOB: 1950/05/22, 71 y.o.   MRN: [id number]  Met with pt and wife who is here to attend therapies with pt. Discussed team conference goals of supervision-min and discharge recommendations. Wife had questions regarding follow up and if wheelchair/transport chair needed. Will ask PT what she recommends. ?

## 2021-05-29 NOTE — Progress Notes (Signed)
?                                                       PROGRESS NOTE ? ? ?Subjective/Complaints: ? ?Pt reports lidoderm was somewhat helpful- ?For back pain-  ?Has a lot of therapy scheduled today ?However one of his good friends died at 15 suddenly last night for unknown reason and he's obviously torn up about it.  ?Did some grief counseling.  ? ?Also asking for SLP for breathing control due to PD.  ? ? ?ROS:  ? ?Pt denies SOB, abd pain, CP, N/V/C/D, and vision changes ? ? ?Objective: ?  ?No results found. ?Recent Labs  ?  05/27/21 ?6073  ?WBC 7.9  ?HGB 15.3  ?HCT 45.2  ?PLT 175  ? ?Recent Labs  ?  05/27/21 ?7106  ?NA 143  ?K 3.3*  ?CL 104  ?CO2 32  ?GLUCOSE 122*  ?BUN 15  ?CREATININE 0.73  ?CALCIUM 8.5*  ? ? ?Intake/Output Summary (Last 24 hours) at 05/29/2021 0811 ?Last data filed at 05/29/2021 0448 ?Gross per 24 hour  ?Intake 200 ml  ?Output 2300 ml  ?Net -2100 ml  ?  ? ?  ? ?Physical Exam: ?Vital Signs ?Blood pressure (!) 139/92, pulse 85, temperature 97.6 ?F (36.4 ?C), resp. rate 18, height '5\' 9"'$  (1.753 m), weight 118 kg, SpO2 98 %. ? ? ? ? ?General: awake, alert, appropriate, sitting up slightly in bed; sad; NAD ?HENT: conjugate gaze; oropharynx dry ?CV: regular rate; no JVD ?Pulmonary: CTA B/L; no W/R/R- good air movement ?GI: soft, NT, ND, (+)BS ?Psychiatric: appropriate but obviously grieving over friend ?Neurological: Ox3- LUE>RUE tremors noted ?GU- foley in place- attached correctly to leg ?Extremities: No clubbing, cyanosis, or edema. Pulses are 2+ ?Psych: Pt's affect is appropriate. Pt is cooperative ?Skin: Clean and intact without signs of breakdown, chronic vasc changes in LE's ?Neuro:  Alert and oriented x 3. Normal insight and awareness. Intact Memory. Normal language and speech. Cranial nerve exam unremarkable. Masked facies. Mild cogwheeling in UE and Le's. Motor 4-5/5 in UE and 3-4/5 in LE's. No focal sensory findings ?Musculoskeletal: mild pain with ROM of hips and shoulder. Fair sitting  posture  ? ? ?Assessment/Plan: ?1. Functional deficits which require 3+ hours per day of interdisciplinary therapy in a comprehensive inpatient rehab setting. ?Physiatrist is providing close team supervision and 24 hour management of active medical problems listed below. ?Physiatrist and rehab team continue to assess barriers to discharge/monitor patient progress toward functional and medical goals ? ?Care Tool: ? ?Bathing ?   ?Body parts bathed by patient: Right arm, Left arm, Chest, Abdomen, Face  ? Body parts bathed by helper: Right upper leg, Left upper leg, Right lower leg, Left lower leg, Buttocks ?  ?  ?Bathing assist Assist Level: Moderate Assistance - Patient 50 - 74% ?  ?  ?Upper Body Dressing/Undressing ?Upper body dressing   ?What is the patient wearing?: Pull over shirt ?   ?Upper body assist Assist Level: Moderate Assistance - Patient 50 - 74% ?   ?Lower Body Dressing/Undressing ?Lower body dressing ? ? ?   ?What is the patient wearing?: Incontinence brief, Pants ? ?  ? ?Lower body assist Assist for lower body dressing: Maximal Assistance - Patient 25 - 49% ?   ? ?Toileting ?Toileting    ?Toileting  assist Assist for toileting: Minimal Assistance - Patient > 75% ?  ?  ?Transfers ?Chair/bed transfer ? ?Transfers assist ?   ? ?Chair/bed transfer assist level: Minimal Assistance - Patient > 75% ?  ?  ?Locomotion ?Ambulation ? ? ?Ambulation assist ? ?   ? ?Assist level: Moderate Assistance - Patient 50 - 74% ?Assistive device: Walker-rolling ?Max distance: 15  ? ?Walk 10 feet activity ? ? ?Assist ?   ? ?Assist level: Moderate Assistance - Patient - 50 - 74% ?Assistive device: Walker-rolling  ? ?Walk 50 feet activity ? ? ?Assist Walk 50 feet with 2 turns activity did not occur: Safety/medical concerns ? ?Assist level: Moderate Assistance - Patient - 50 - 74% ?Assistive device: Walker-rolling  ? ? ?Walk 150 feet activity ? ? ?Assist Walk 150 feet activity did not occur: Safety/medical concerns ? ?  ?  ?   ? ?Walk 10 feet on uneven surface  ?activity ? ? ?Assist Walk 10 feet on uneven surfaces activity did not occur: Safety/medical concerns ? ? ?  ?   ? ?Wheelchair ? ? ? ? ?Assist Is the patient using a wheelchair?: Yes ?Type of Wheelchair: Manual ?  ? ?Wheelchair assist level: Dependent - Patient 0% ?Max wheelchair distance: 150'  ? ? ?Wheelchair 50 feet with 2 turns activity ? ? ? ?Assist ? ?  ?  ? ? ?Assist Level: Dependent - Patient 0%  ? ?Wheelchair 150 feet activity  ? ? ? ?Assist ?   ? ? ?Assist Level: Dependent - Patient 0%  ? ?Blood pressure (!) 139/92, pulse 85, temperature 97.6 ?F (36.4 ?C), resp. rate 18, height '5\' 9"'$  (1.753 m), weight 118 kg, SpO2 98 %. ? ?Medical Problem List and Plan: ?1. Functional deficits secondary to pseudo-exacerbation of parkinson's disease/disability ?            -patient may shower ?            -ELOS/Goals: 7-10 days supervision to min assist goals ? Con't CIR PT and OT- order SLP for breathing control  exercises due to PD.  ?2.  Antithrombotics: ?-DVT/anticoagulation:  Pharmaceutical: Xarelto ?            -antiplatelet therapy: n/a ?3. Pain Management: tramadol and tylenol prn. Appears fairly well managed at present ?            -monitor activity tolerance ? 5/9- will add lidoderm patches 8pm to 8am- advised pt to have nursing remove in AM ? 5/10- has helped some- con't regimen ?4. Mood: team to provide ego support ?            -klonopin prn for anxiety ?            -antipsychotic agents: n/a ?5. Neuropsych: This patient is capable of making decisions on his own behalf. ?6. Skin/Wound Care: local skin, foley care ?7. Fluids/Electrolytes/Nutrition: encourage appropriate nutrition ?            -still with hypomagnesia--continue oral supplementation  ?8. Parkinson's Disease: ?            -amantadine, sinemet, comtan per baseline regimen ? -seems fairly well compensated ?9. AKI/Urine retention/BPH: ?            -Foley in place and will follow up with urology ([facility]) in about 2  weeks ?            -finasteride and fesoterodine per urology ? 5/9- Urology wants foley to remain in place until d/c.  ?10. HTN:  5/7 remains quite elevate ?            -continue norvasc, lopressor  ?            -add in benazepril which he used in combo with amlodipine pta  ?11. Slow transit constipation: ?            -scheduled miralax and senna-s ?            -prn's ordered ?12. Hx of CAD/persistent A -fib --lopressor, lipitor, xarelto ?            -HR borderline at present ?            5/7 -Mg++ still only 1.6 but only received one dose so far of mag gluc '500mg'$  bid ?  -consider IVPB Mg++ 5/8 if still in the 1.5-6 range ?  -will add daily potassium as well as it's low as well ? 5/9- will replete K+ 40 mEq x2 and since Mg 1.4- will give IV Mg and recheck Thursday ? 5/10- labs in AM ?13. OSA: not currently using bipap ?14. Morbid obesity   ?15. Right Renal mass : c/w cystic lesion on MRI ? 16. Grief- ?06/20/22- Good friend died suddenly- will monitor closely- d/w nursing to keep an eye on him.  ? ? ?I spent a total of 35  minutes on total care today- >50% coordination of care- due to d/w nursing and grief counseling for pt.  ? ? ? ? ?LOS: ?4 days ?A FACE TO FACE EVALUATION WAS PERFORMED ? ?Isaiah Pena ?06-19-2021, 8:11 AM  ? ?  ?

## 2021-05-29 NOTE — Progress Notes (Signed)
Physical Therapy Session Note ? ?Patient Details  ?Name: Isaiah Pena ?MRN: [id number] ?Date of Birth: 02-Jul-1950 ? ?Today's Date: 05/29/2021 ?PT Individual Time: 1448-1856 ?PT Individual Time Calculation (min): 75 min  ? ?Short Term Goals: ?Week 1:  PT Short Term Goal 1 (Week 1): =LTG due to ELOS ? ?Skilled Therapeutic Interventions/Progress Updates:  ?  Pt seated in w/c on arrival and agreeable to therapy. Pt reports pain in R knee, unrated. RN administered tylenol during session. Rest and positioning PRN. Therapist managed foley catheter throughout session.  ? ?Pt transported to therapy gym for time management and energy conservation. Pt ambulated with RW x 340 ft with CGA and verbal cueing for increased step length, upright gaze, and RW proximity. Pt required 3 rest breaks, greatest distance ~ 130 ft. Pt then transitioned to nu step with RW and CGA, cueing for technique and hand placement. Pt utilized nustep at level 6 for 2 x 3 min for integration of reciprocal motion and cues for full ROM. After 2nd bout, pt reports urgent need to use bathroom. Stand pivot transfer back to w/c with CGA and RW, transport back to room for time. Pt ambulated into bathroom with mod cueing to get over threshold and RW. Pt able to doff pants with CGA but required min A to pull back over hips. CGA hygiene, no void noted. Pt returned to w/c after hand hygiene in standing. Pt was left with all needs in reach and alarm active.   ? ?Therapy Documentation ?Precautions:  ?Precautions ?Precautions: Fall ?Precaution Comments: PD at baseline ?Restrictions ?Weight Bearing Restrictions: No ?General: ?  ? ? ? ? ?Therapy/Group: Individual Therapy ? ?Clem[place] ?05/29/2021, 10:05 AM  ?

## 2021-05-30 DIAGNOSIS — R5381 Other malaise: Secondary | ICD-10-CM | POA: Diagnosis not present

## 2021-05-30 LAB — MAGNESIUM: Magnesium: 1.8 mg/dL (ref 1.7–2.4)

## 2021-05-30 LAB — BASIC METABOLIC PANEL
Anion gap: 6 (ref 5–15)
BUN: 16 mg/dL (ref 8–23)
CO2: 28 mmol/L (ref 22–32)
Calcium: 8.7 mg/dL — ABNORMAL LOW (ref 8.9–10.3)
Chloride: 108 mmol/L (ref 98–111)
Creatinine, Ser: 0.82 mg/dL (ref 0.61–1.24)
GFR, Estimated: >60 mL/min (ref >60)
Glucose, Bld: 111 mg/dL — ABNORMAL HIGH (ref 70–99)
Potassium: 3.8 mmol/L (ref 3.5–5.1)
Sodium: 142 mmol/L (ref 135–145)

## 2021-05-30 MED ORDER — POLYETHYLENE GLYCOL 3350 17 G PO PACK
17.0000 g | PACK | Freq: Once | ORAL | Status: DC
  Filled 2021-05-30: qty 1

## 2021-05-30 NOTE — Plan of Care (Signed)
?  Problem: RH Expression Communication ?Goal: LTG Patient will increase speech intelligibility (SLP) ?Description: LTG: Patient will increase speech intelligibility at word/phrase/conversation level with cues, % of the time (SLP) ?Flowsheets (Taken 05/30/2021 1735) ?LTG: Patient will increase speech intelligibility (SLP): Minimal Assistance - Patient > 75% ?Level: Phrase ?  ?

## 2021-05-30 NOTE — Evaluation (Signed)
Speech Language Pathology Assessment and Plan ? ?Patient Details  ?Name: Isaiah Pena ?MRN: [id number] ?Date of Birth: [birth year] ? ?SLP Diagnosis: Speech and Language deficits  ?Rehab Potential: Good ?ELOS: 5/17  ? ?Today's Date: 05/30/2021 ?SLP Individual Time: 5859-2924 ?SLP Individual Time Calculation (min): 45 min ? ?Hospital Problem: Principal Problem: ?  Debility ? ?Past Medical History:  ?Past Medical History:  ?Diagnosis Date  ? Anticoagulant long-term use   ? xarelto  ? Bilateral lower extremity edema   ? BPH (benign prostatic hyperplasia)   ? BPH (benign prostatic hyperplasia)   ? Central serous retinopathy   ? LOSS OF CENTER VISION LEFT EYE W/ BLURRED VISION-  was treated  methotrexate by specialist at [facility] and released  ? Coronary artery disease cardiologist-  dr Tressia Miners turner  ? 01/ 2004  abnormal cardiolite  s/p  cardiac cath w/ PCI and DES to dRCA  ? Edema of extremities   ? CHRONIC   ? Erectile dysfunction   ? GERD (gastroesophageal reflux disease)   ? History of colon polyps   ? 2011 per pt benign  ? History of kidney stones   ? History of simple renal cyst   ? drained via radiology  ? History of torn meniscus of right knee   ? Hyperlipidemia   ? Hypertension   ? Kidney stone   ? HX OF STONE-PASSED   ? Lower urinary tract symptoms (LUTS)   ? OSA (obstructive sleep apnea) 09/29/2011  ? NPSG 2004:  AHI 25/hr  Rx with bilevel.     ? OSA treated with BiPAP   ? moderate per study 01-23-2002  ? Parkinson's disease ([place])   ? neurologist-  dr ed hill ([facility])  ? Permanent atrial fibrillation ([place])   ? Persistent atrial fibrillation ([place]) 11/08/2012  ? S/P drug eluting coronary stent placement   ? 01/ 2004  x1 to dRCA  ? ?Past Surgical History:  ?Past Surgical History:  ?Procedure Laterality Date  ? CARDIOVASCULAR STRESS TEST  05-07-2015   dr Tressia Miners turner  ? normal nuclear study w/ no ischemia/  normal LV function and wall motion , stress ef 55%  ? COLONOSCOPY WITH PROPOFOL  2011  ? CORONARY  ANGIOPLASTY WITH STENT PLACEMENT  02-11-2002  dr Daneen Schick  ? abnormal cardiolite--  PCI and DES to dRCA (99%),  pRCA luminal irregularities, mild to moderate LAD and CFX disease,  normal LVF  ? CYSTOSCOPY WITH INSERTION OF UROLIFT N/A 01/28/2016  ? Procedure: CYSTOSCOPY WITH INSERTION OF UROLIFT;  Surgeon: Franchot Gallo, MD;  Location: [facility];  Service: Urology;  Laterality: N/A;  ? HERNIA REPAIR    ? AGE 48  ? KNEE ARTHROSCOPY  03/21/2011  ? Procedure: ARTHROSCOPY KNEE;  Surgeon: Tobi Bastos, MD;  Location: [facility];  Service: Orthopedics;  Laterality: Right;  ? ? ?Assessment / Plan / Recommendation ?Clinical Impression Pt is a 71 year old patient with Parkinson disease and paroxysmal atrial fibrillation on Xarelto as well as BPH.  He presented to the emergency room on 05/21/2021 with a 3 to 4-day history of dizziness and weakness as well as urinary retention.  He is also having constipation.  Lab work-up revealed acute kidney injury and his bladder was distended on CT of the abdomen and pelvis.  He was thought to have bladder outlet obstruction from prostate gland enlargement.  Mild bilateral hydronephrosis was seen.  Foley catheter was ultimately inserted as patient was unable to pass urine.  Is also felt  that his Parkinson disease contributed to his bladder issues.  The patient has been followed by Dr. Diona Fanti and has failed other medications in the past.  Patient demonstrated ongoing difficulties with mobility and self-care given the medical issues listed above.  He was assessed by the therapy and inpatient rehab team's and is felt that he could benefit from an inpatient rehab stay to improve his functional ability and self-care. ? ?Patient evaluated for speech/voice assessment secondary to speech changes from Parkinson's disease. Pt was accompanied by spouse during today's encounter. Pt/spouse endorsed a gradual decline in vocal intensity impacting conversational speech over the past  year attributed to PD and feel this hospitalization has slightly exacerbated Parkinson's symptoms including speech intensity. Pt presents with significantly reduced breath support consequently impacting vocal intensity and overall speech intelligibility at the word level. Pt initiates words with clear and adequate vocal intensity however this gradually declines and pt's voice eventually becomes breathy/whisper quality. Pt expressed it feels like he runs out of air to talk and fatigues easily, which was evident during this encounter. Pt's sustained phonation ("ah") time was limited to average of 2.5 seconds and followed by periods of aphonia. With optimal breath support, pt's voice was perceived as clear and strong. SLP initiated instruction on diaphragmatic breathing exercises in which pt required max-to-total A to execute. Pt's current breath support and vocal intensity is suboptimal for communicating functional needs and engaging in social communication in an effective manner. Patient would benefit from short-term SLP services to enhance breath support and vocal intensity to improve ability to communicate in functional settings. Pt may benefit from consideration of LSVT or SpeakOut programs in an outpatient setting at discharge or in the future for further intervention for voice/speech changes secondary to Parkinson's. ?  ?Skilled Therapeutic Interventions          Pt participated in assessments of voice, speech, and language function. Please see above.     ?SLP Assessment ? Patient will need skilled Speech Lanaguage Pathology Services during CIR admission  ?  ?Recommendations ? Patient destination: Home ?Follow up Recommendations: Other (comment);Outpatient SLP (Consideration of OPSLP for LSVT Speak Out programs) ?Equipment Recommended: None recommended by SLP  ?  ?SLP Frequency 1 to 3 out of 7 days   ?SLP Duration ? ?SLP Intensity ? ?SLP Treatment/Interventions 5/17 ? ?Minumum of 1-2 x/day, 30 to 90  minutes ? ?Therapeutic Exercise;Functional tasks;Patient/family education;Speech/Language facilitation   ? ?Pain ? None ? ?Prior Functioning ?Cognitive/Linguistic Baseline: Within functional limits ?Type of Home: House ? Lives With: Spouse ?Available Help at Discharge: Family;Available 24 hours/day ? ?SLP Evaluation ?Cognition ?Overall Cognitive Status: Within Functional Limits for tasks assessed ?Arousal/Alertness: Awake/alert ?Orientation Level: Oriented X4 ?Year: 2023 ?Month: May ?Day of Week: Correct ?Focused Attention: Appears intact ?Memory: Appears intact ?Awareness: Appears intact ?Problem Solving: Appears intact ?Reasoning: Appears intact ?Safety/Judgment: Appears intact  ?Comprehension ?Auditory Comprehension ?Overall Auditory Comprehension: Appears within functional limits for tasks assessed ?Expression ?Expression ?Primary Mode of Expression: Verbal ?Verbal Expression ?Overall Verbal Expression: Appears within functional limits for tasks assessed ?Oral Motor ?Oral Motor/Sensory Function ?Overall Oral Motor/Sensory Function: Mild impairment (mildly reduced lingual speed of movements) ?Facial ROM: Within Functional Limits ?Facial Symmetry: Within Functional Limits ?Facial Strength: Within Functional Limits ?Facial Sensation: Within Functional Limits ?Lingual ROM: Within Functional Limits ?Lingual Symmetry: Within Functional Limits ?Lingual Strength: Within Functional Limits ?Lingual Sensation: Within Functional Limits ?Velum: Within Functional Limits ?Mandible: Within Functional Limits ?Motor Speech ?Overall Motor Speech: Impaired ?Respiration: Impaired ?Level of Impairment: Word ?Phonation:  Aphonic;Low vocal intensity ?Resonance: Within functional limits ?Articulation: Impaired (due to reduced breath support/vocal intensity) ?Level of Impairment: Word ?Intelligibility: Intelligibility reduced ?Word: 75-100% accurate ?Phrase: 75-100% accurate ?Sentence: 75-100% accurate ?Conversation: 75-100%  accurate ?Motor Planning: Witnin functional limits ?Motor Speech Errors: Not applicable ? ?Care Tool ?Care Tool Cognition ?Ability to hear (with hearing aid or hearing appliances if normally used Ability to hear (with hearing aid or h

## 2021-05-30 NOTE — Progress Notes (Addendum)
Occupational Therapy Session Note ? ?Patient Details  ?Name: Isaiah Pena ?MRN: [id number] ?Date of Birth: [birth year] ? ?Today's Date: 05/30/2021 ?OT Individual Time: 0800-0900 ?OT Individual Time Calculation (min): 60 min  ? ? ?Short Term Goals: ?Week 1:  OT Short Term Goal 1 (Week 1): STGs=LTGs due to ELOS ? ?Skilled Therapeutic Interventions/Progress Updates:  ?   ?Pt seen for skilled OT treatment this am. Pt in bed finished with breakfast upon OT arrival. Pt reports he had a good night and had visits from his friend and brother and SIL. He demonstrated a thumb piano device and played a tune for OT prior to mobilization and self care progression. MD arrived to assess pt as well and care coordination for d/c needs and overall progress conducted with pt and OT. Pt continues to require increased time, effort and assistance when coming out of bed in the am due to stiffness and motor planning difficulties with mod A for supine to sit and scooting to EOB but then pt has improved overall fluidity and motor responses. Pt assisted with RW for sit to stand from bed with min A and ambulated to and from bathroom ~ 15 ft x 2 with OT providing all assist with Foley bag and tubing mngt throughout session. Pt able to have BM on 3 in 1 adjusted correct height this session and needed mod A for peri hygiene. Min A toilet transfer amb level with cues for hand and foot placement. UB sponge bathing, dressing and mouth care sink side seated level with set up. LB sponge bathing with mod A, LB dressing with mod A due to Foley and new integration techniques for reacher use. Set up and min cues for slip on shoes using [place] provided by OT for use and pt reports having one at home.  Contact guard A for sit to stand during dressing from w/c with RW for UE support and steadying assist. Pt remained up in w/c at end of session with call bell, chair alarm and needs in place.  ? ? ? ?Therapy/Group: Individual Therapy ? ?Barnabas Lister ?05/30/2021,  10:42 AM ?

## 2021-05-30 NOTE — Progress Notes (Signed)
?                                                       PROGRESS NOTE ? ? ?Subjective/Complaints: ? ?Pretty good today.  ?Still wearing patches x 2 days- ? ?Back pain 6/10.  ?LBM yesterday.  ?Due to have BM. Wants some meds to go- something "mild".  ? ? ?ROS:  ? ?Pt denies SOB, abd pain, CP, N/V/C/D, and vision changes ? ? ?Objective: ?  ?No results found. ?No results for input(s): WBC, HGB, HCT, PLT in the last 72 hours. ? ?Recent Labs  ?  05/30/21 ?0620  ?NA 142  ?K 3.8  ?CL 108  ?CO2 28  ?GLUCOSE 111*  ?BUN 16  ?CREATININE 0.82  ?CALCIUM 8.7*  ? ? ?Intake/Output Summary (Last 24 hours) at 05/30/2021 0829 ?Last data filed at 05/30/2021 0700 ?Gross per 24 hour  ?Intake 236 ml  ?Output 450 ml  ?Net -214 ml  ?  ? ?  ? ?Physical Exam: ?Vital Signs ?Blood pressure (!) 139/99, pulse 86, temperature 97.7 ?F (36.5 ?C), resp. rate 16, height '5\' 9"'$  (1.753 m), weight 118 kg, SpO2 96 %. ? ? ? ? ? ?General: awake, alert, appropriate, sitting up in bed; NAD ?HENT: conjugate gaze; oropharynx moist ?CV: regular rate; no JVD ?Pulmonary: CTA B/L; no W/R/R- good air movement ?GI: soft, NT, ND, (+)BS ?Psychiatric: appropriate- still sad ?Neurological: Ox3 ?GU- foley in place- attached correctly to leg- no change ?Extremities: No clubbing, cyanosis, or edema. Pulses are 2+ ?Psych: Pt's affect is appropriate. Pt is cooperative ?Skin: Clean and intact without signs of breakdown, chronic vasc changes in LE's ?Neuro:  Alert and oriented x 3. Normal insight and awareness. Intact Memory. Normal language and speech. Cranial nerve exam unremarkable. Masked facies. Mild cogwheeling in UE and Le's. Motor 4-5/5 in UE and 3-4/5 in LE's. No focal sensory findings ?Musculoskeletal: mild pain with ROM of hips and shoulder. Fair sitting posture  ? ? ?Assessment/Plan: ?1. Functional deficits which require 3+ hours per day of interdisciplinary therapy in a comprehensive inpatient rehab setting. ?Physiatrist is providing close team supervision and 24  hour management of active medical problems listed below. ?Physiatrist and rehab team continue to assess barriers to discharge/monitor patient progress toward functional and medical goals ? ?Care Tool: ? ?Bathing ?   ?Body parts bathed by patient: Right arm, Left arm, Chest, Abdomen, Face  ? Body parts bathed by helper: Right upper leg, Left upper leg, Right lower leg, Left lower leg, Buttocks ?  ?  ?Bathing assist Assist Level: Moderate Assistance - Patient 50 - 74% ?  ?  ?Upper Body Dressing/Undressing ?Upper body dressing   ?What is the patient wearing?: Pull over shirt ?   ?Upper body assist Assist Level: Moderate Assistance - Patient 50 - 74% ?   ?Lower Body Dressing/Undressing ?Lower body dressing ? ? ?   ?What is the patient wearing?: Incontinence brief, Pants ? ?  ? ?Lower body assist Assist for lower body dressing: Maximal Assistance - Patient 25 - 49% ?   ? ?Toileting ?Toileting    ?Toileting assist Assist for toileting: Minimal Assistance - Patient > 75% ?  ?  ?Transfers ?Chair/bed transfer ? ?Transfers assist ?   ? ?Chair/bed transfer assist level: Minimal Assistance - Patient > 75% ?  ?  ?Locomotion ?  Ambulation ? ? ?Ambulation assist ? ?   ? ?Assist level: Moderate Assistance - Patient 50 - 74% ?Assistive device: Walker-rolling ?Max distance: 53  ? ?Walk 10 feet activity ? ? ?Assist ?   ? ?Assist level: Moderate Assistance - Patient - 50 - 74% ?Assistive device: Walker-rolling  ? ?Walk 50 feet activity ? ? ?Assist Walk 50 feet with 2 turns activity did not occur: Safety/medical concerns ? ?Assist level: Moderate Assistance - Patient - 50 - 74% ?Assistive device: Walker-rolling  ? ? ?Walk 150 feet activity ? ? ?Assist Walk 150 feet activity did not occur: Safety/medical concerns ? ?  ?  ?  ? ?Walk 10 feet on uneven surface  ?activity ? ? ?Assist Walk 10 feet on uneven surfaces activity did not occur: Safety/medical concerns ? ? ?  ?   ? ?Wheelchair ? ? ? ? ?Assist Is the patient using a wheelchair?:  Yes ?Type of Wheelchair: Manual ?  ? ?Wheelchair assist level: Dependent - Patient 0% ?Max wheelchair distance: 150'  ? ? ?Wheelchair 50 feet with 2 turns activity ? ? ? ?Assist ? ?  ?  ? ? ?Assist Level: Dependent - Patient 0%  ? ?Wheelchair 150 feet activity  ? ? ? ?Assist ?   ? ? ?Assist Level: Dependent - Patient 0%  ? ?Blood pressure (!) 139/99, pulse 86, temperature 97.7 ?F (36.5 ?C), resp. rate 16, height '5\' 9"'$  (1.753 m), weight 118 kg, SpO2 96 %. ? ?Medical Problem List and Plan: ?1. Functional deficits secondary to pseudo-exacerbation of parkinson's disease/disability ?            -patient may shower ?            -ELOS/Goals: 7-10 days supervision to min assist goals ? Continue CIR- PT, OT and SLP for breathing exercises.  ?2.  Antithrombotics: ?-DVT/anticoagulation:  Pharmaceutical: Xarelto ?            -antiplatelet therapy: n/a ?3. Pain Management: tramadol and tylenol prn. Appears fairly well managed at present ?            -monitor activity tolerance ? 5/9- will add lidoderm patches 8pm to 8am- advised pt to have nursing remove in AM ? 5/10- has helped some- con't regimen ? 5/11- needs to have patches changed daily.  ?4. Mood: team to provide ego support ?            -klonopin prn for anxiety ?            -antipsychotic agents: n/a ?5. Neuropsych: This patient is capable of making decisions on his own behalf. ?6. Skin/Wound Care: local skin, foley care ?7. Fluids/Electrolytes/Nutrition: encourage appropriate nutrition ?            -still with hypomagnesia--continue oral supplementation  ?8. Parkinson's Disease: ?            -amantadine, sinemet, comtan per baseline regimen ? -seems fairly well compensated ?9. AKI/Urine retention/BPH: ?            -Foley in place and will follow up with urology ([place]) in about 2 weeks ?            -finasteride and fesoterodine per urology ? 5/9- Urology wants foley to remain in place until d/c.  ?10. HTN: 5/7 remains quite elevate ?            -continue norvasc,  lopressor  ?            -add in benazepril which  he used in combo with amlodipine pta  ? 5/11- BP doing better 130s/90s- con't regimen ?11. Slow transit constipation: ?            -scheduled miralax and senna-s ?            -prn's ordered ?12. Hx of CAD/persistent A -fib --lopressor, lipitor, xarelto ?            -HR borderline at present ?            5/7 -Mg++ still only 1.6 but only received one dose so far of mag gluc '500mg'$  bid ?  -consider IVPB Mg++ 5/8 if still in the 1.5-6 range ?  -will add daily potassium as well as it's low as well ? 5/9- will replete K+ 40 mEq x2 and since Mg 1.4- will give IV Mg and recheck Thursday ? 5/10- labs in AM ? 5/11- Mg 1.8 and K+ 3.8- doing better ?13. OSA: not currently using bipap ?14. Morbid obesity   ?15. Right Renal mass : c/w cystic lesion on MRI ? 16. Grief- ?06/29/2022- Good friend died suddenly- will monitor closely- d/w nursing to keep an eye on him.  ?17. Constipation ? 5/11- miralax also given x1 in addition to bowel meds ? ? ? ? ?LOS: ?5 days ?A FACE TO FACE EVALUATION WAS PERFORMED ? ?Rethel Sebek ?05/30/2021, 8:29 AM  ? ?  ?

## 2021-05-30 NOTE — Progress Notes (Signed)
? ?  Physical Therapy Session Note ? ?Patient Details  ?Name: Isaiah Pena ?MRN: [id number] ?Date of Birth: Jun 02, 1950 ? ?Today's Date: 05/30/2021 ?PT Individual Time: 1000-1043 and 1431-1540 ?PT Individual Time Calculation (min): 43 min and 29mn  ? ?Short Term Goals: ?Week 1:  PT Short Term Goal 1 (Week 1): =LTG due to ELOS ?Week 2:    ?Week 3:    ? ?Skilled Therapeutic Interventions/Progress Updates:  ?  AM SESSION ?Pain - denies pain, states he has meds for chronic low back pain but no c/o at this time. ?Pt transported to gym ?In parallel bars worked on large amplitude movements beginning w/overhead wide reach/alternating adding trunk/cervical extension w/task. ?Then large wide steps to target/alternating.  Then combining wide reach and wide step to target, tactile guiding to coordinate U and LE. ? ? ?Gait 875fincluding turning 180* at 4062f/cga, cues to increase step length and for upright posture, fades into shuffling but able to increase step legth when cued.   ? ?Pt left oob in wc w/alarm belt set and needs in reach ? ? ?PM SESSION ?Pt requested therapy session be performed outdoors for mental wellness.  Pt transported to outdoor area around water fountain to promote stress relief/wellness. Discussed fall risk/balance deficits related to PD. ? ?Gait trials on uneven outdoor surfaces - increased incidences of festenating gait, shuffling and noted increased tremors and freezing this pm. ?Gait 20f20f1, 80ft11f w/overal min assist.  Occasional standing rest breaks to "reset" gait pattern/interrupt shuffling w/cues for increased step length and for upright gaze.  Noted pt lifts rear of walker frequently due to friction of back endcaps w/floor which creates post wt shift and likely increases fall risk.  Discussed trying balls or walker "ski" attachments to facilitate fluidity of movement. ? ?Repeated Sit to stand from wc for functional strengthening.  ? ?Pt transported back to room.  Pt very appreciative of  opportunity to be outdoors for session.  ?Pt left oob in wc w/alarm belt set and needs in reach ? ?Therapy Documentation ?Precautions:  ?Precautions ?Precautions: Fall ?Precaution Comments: PD at baseline ?Restrictions ?Weight Bearing Restrictions: No ? ? ? ?Therapy/Group: Individual Therapy ?BarbaCallie Fielding? ? ?BarbaJerrilyn Cairo1/2023, 12:38 PM  ?

## 2021-05-31 NOTE — Evaluation (Signed)
Recreational Therapy Assessment and Plan ? ?Patient Details  ?Name: Isaiah Pena ?MRN: [id number] ?Date of Birth: 10-19-1950 ?Today's Date: 05/31/2021 ? ?Rehab Potential:  Good ?ELOS:   d/c 5/17 ? ?Assessment ?Hospital Problem: Principal Problem: ?  Debility ?  ?  ?Past Medical History:  ?    ?Past Medical History:  ?Diagnosis Date  ? Anticoagulant long-term use    ?  xarelto  ? Bilateral lower extremity edema    ? BPH (benign prostatic hyperplasia)    ? BPH (benign prostatic hyperplasia)    ? Central serous retinopathy    ?  LOSS OF CENTER VISION LEFT EYE W/ BLURRED VISION-  was treated  methotrexate by specialist at [facility] and released  ? Coronary artery disease cardiologist-  dr Tressia Miners turner  ?  01/ 2004  abnormal cardiolite  s/p  cardiac cath w/ PCI and DES to dRCA  ? Edema of extremities    ?  CHRONIC   ? Erectile dysfunction    ? GERD (gastroesophageal reflux disease)    ? History of colon polyps    ?  2011 per pt benign  ? History of kidney stones    ? History of simple renal cyst    ?  drained via radiology  ? History of torn meniscus of right knee    ? Hyperlipidemia    ? Hypertension    ? Kidney stone    ?  HX OF STONE-PASSED   ? Lower urinary tract symptoms (LUTS)    ? OSA (obstructive sleep apnea) 09/29/2011  ?  NPSG 2004:  AHI 25/hr  Rx with bilevel.     ? OSA treated with BiPAP    ?  moderate per study 01-23-2002  ? Parkinson's disease ([place])    ?  neurologist-  dr ed hill ([facility])  ? Permanent atrial fibrillation ([place])    ? Persistent atrial fibrillation ([place]) 11/08/2012  ? S/P drug eluting coronary stent placement    ?  01/ 2004  x1 to dRCA  ?  ?Past Surgical History:  ?     ?Past Surgical History:  ?Procedure Laterality Date  ? CARDIOVASCULAR STRESS TEST   05-07-2015   dr Tressia Miners turner  ?  normal nuclear study w/ no ischemia/  normal LV function and wall motion , stress ef 55%  ? COLONOSCOPY WITH PROPOFOL   2011  ? CORONARY ANGIOPLASTY WITH STENT PLACEMENT   02-11-2002  dr Daneen Schick  ?   abnormal cardiolite--  PCI and DES to dRCA (99%),  pRCA luminal irregularities, mild to moderate LAD and CFX disease,  normal LVF  ? CYSTOSCOPY WITH INSERTION OF UROLIFT N/A 01/28/2016  ?  Procedure: CYSTOSCOPY WITH INSERTION OF UROLIFT;  Surgeon: Franchot Gallo, MD;  Location: [facility];  Service: Urology;  Laterality: N/A;  ? HERNIA REPAIR      ?  AGE 4  ? KNEE ARTHROSCOPY   03/21/2011  ?  Procedure: ARTHROSCOPY KNEE;  Surgeon: Tobi Bastos, MD;  Location: [facility];  Service: Orthopedics;  Laterality: Right;  ?  ?  ?Assessment & Plan ?Clinical Impression:  ?This is a 71 year old patient with Parkinson disease and paroxysmal atrial fibrillation on Xarelto as well as BPH.  He presented to the emergency room on 05/21/2021 with a 3 to 4-day history of dizziness and weakness as well as urinary retention.  He is also having constipation.  Lab work-up revealed acute kidney injury and his bladder was distended on CT of  the abdomen and pelvis.  He was thought to have bladder outlet obstruction from prostate gland enlargement.  Mild bilateral hydronephrosis was seen.  Foley catheter was ultimately inserted as patient was unable to pass urine.  Is also felt that his Parkinson disease contributed to his bladder issues.  The patient has been followed by Dr. Diona Fanti and has failed other medications in the past.  Patient demonstrated ongoing difficulties with mobility and self-care given the medical issues listed above.  He was assessed by the therapy and inpatient rehab team's and is felt that he could benefit from an inpatient rehab stay to improve his functional ability and self-care. Patient transferred to CIR on 05/25/2021 .  ?  ?Pt presents with decreased activity tolerance, decreased functional mobility, decreased balance, decreased coordination Limiting pt's independence with leisure/community pursuits. ? ?Met with pt today to discuss TR services including leisure education, activity  analysis/modifications & community reintegration.  Also discussed the importance of social, emotional, spiritual health in addition to physical health and their effects on overall health and wellness.  Pt appreciative of the session and states understanding. ? ?Plan ? No further TR as pt is to d/c home 5/17 ? ?Recommendations for other services: None  ? ?Discharge Criteria: Patient will be discharged from TR if patient refuses treatment 3 consecutive times without medical reason.  If treatment goals not met, if there is a change in medical status, if patient makes no progress towards goals or if patient is discharged from hospital. ? ?The above assessment, treatment plan, treatment alternatives and goals were discussed and mutually agreed upon: by patient ? ?Isaiah Pena ?05/31/2021, 12:25 PM  ?

## 2021-05-31 NOTE — Progress Notes (Signed)
Occupational Therapy Session Note ? ?Patient Details  ?Name: MAKHI MUZQUIZ ?MRN: [id number] ?Date of Birth: 1950/06/09 ? ?Today's Date: 05/31/2021 ?OT Individual Time: 6659-9357 ?OT Individual Time Calculation (min): 45 min  ? ? ?Short Term Goals: ?Week 1:  OT Short Term Goal 1 (Week 1): STGs=LTGs due to ELOS ? ?Skilled Therapeutic Interventions/Progress Updates:  ? ?Pt visiting up in w/c with wife present upon OT arrival. Pt reports some fatigue but agreeable to session. Wife reports having all needed OT DME for discharge planning and will explore getting the transport w/c as discussed with PT. Pt assisted OT to change sheets and make up soiled bed as pt requesting to rest for a bit in bed prior to long PT session following OT. Increased time for donning 4 pillow cases on pillows. Transferred w/c to bed with contact guard and min A to move sit to supine. Pt was able to complete medium resistance theraband exercises B sh flex/ext, horizontal abd/add, elbow flex/ext 3 sets of 10 reps Bly with rests in between. Cues for technique.  Pt left at end of session with tray table, call bell and bed alarm in place.  ? ? ? ?Therapy/Group: Individual Therapy ? ?Barnabas Lister ?05/31/2021, 4:19 PM ?

## 2021-05-31 NOTE — Progress Notes (Signed)
Physical Therapy Session Note ? ?Patient Details  ?Name: Isaiah Pena ?MRN: [id number] ?Date of Birth: 15-Jun-1950 ? ?Today's Date: 05/31/2021 ?PT Individual Time: 5188-4166 ?PT Individual Time Calculation (min): 77 min  ? ?Short Term Goals: ?Week 1:  PT Short Term Goal 1 (Week 1): =LTG due to ELOS ? ?Skilled Therapeutic Interventions/Progress Updates:  ?  pt received in bed and agreeable to therapy. Pt reports 6/10 knee pain, premedicated. Rest and positioning provided as needed. Pt reports today is "a bad parkinsons day." Noted increased tremors, rigidity, slower movements, flatter affect. Supine>sit with min A for LE management and trunk upright, occasional min A to maintain sitting balance. CGA for Sit to stand and ambulatory transfer to w/ RW. Pt agreeable to nu step for gentle exercise in sitting and improvement of reciprocal motion and global endurance. Pt performed 4  x 5 min with 2 min rest breaks, used meaningful music for improved engagement and participation. Transitioned to side steps on wall rail 4 x 12 ft BIL. Cues for neutral hip rotation and increased step length. Seated rest breaks d/t fatigue. Pt then transported back to room and remained in w/c, was left with all needs in reach and alarm active.  ? ?Therapy Documentation ?Precautions:  ?Precautions ?Precautions: Fall ?Precaution Comments: PD at baseline ?Restrictions ?Weight Bearing Restrictions: No ?General: ?  ? ? ? ? ?Therapy/Group: Individual Therapy ? ?[place] ?05/31/2021, 2:40 PM  ?

## 2021-05-31 NOTE — Progress Notes (Signed)
Occupational Therapy Session Note ? ?Patient Details  ?Name: Isaiah Pena ?MRN: [id number] ?Date of Birth: 07-Jul-1950 ? ?Today's Date: 05/31/2021 ?OT Individual Time: 0800-0900 ?OT Individual Time Calculation (min): 60 min  ? ? ?Short Term Goals: ?Week 1:  OT Short Term Goal 1 (Week 1): STGs=LTGs due to ELOS ? ?Skilled Therapeutic Interventions/Progress Updates:  ? ? ?Pt up in w/c after completing am meal upon OT arrival. Motivated and open to all tasks presented and agreeable to shower routine and AE progression for toileting, self care training, tub transfer bench and standing balance safety with strong focus on motor planning and sequencing. OT assisted with all Foley bag and tubing management throughout session. Pt amb with RW from w/c in and out of bathroom with contact guard A with improved fluidity from previous AM sessions. Pt used 3 in 1 commode over toilet with bar for BM and reported to SN and on Flowsheets. Min A for peri hygiene post BM. Then to and from transfer tub bench with grab bar support requiring contact guard A. Performed UB bathing with Supervision in shower and pt demonstrated ability to cross B LE's over knees to reach feet. Overall min A LB bathing including peri/buttock regions. Dressing and grooming sink side level seated with intermittent standing with RW and integration of AE. UB dressing pullover shirt with set up and LB dressing with min-mod A with reacher, sock aide, [place] including tennis shoes. Grooming in standing for mouth and hair care with contact guard A. Pt left bedside in w/c after session with call bell, needs and chair alarm set up. ? ? ?Therapy/Group: Individual Therapy ? ?Isaiah Pena ?05/31/2021, 4:09 PM ?

## 2021-05-31 NOTE — Progress Notes (Signed)
?                                                       PROGRESS NOTE ? ? ?Subjective/Complaints: ? ? ?Pt reports LBM yesterday ?No issues ?Ate almost all tray.  ? ?ROS:  ? ?Pt denies SOB, abd pain, CP, N/V/C/D, and vision changes ? ?Objective: ?  ?No results found. ?No results for input(s): WBC, HGB, HCT, PLT in the last 72 hours. ? ?Recent Labs  ?  05/30/21 ?0620  ?NA 142  ?K 3.8  ?CL 108  ?CO2 28  ?GLUCOSE 111*  ?BUN 16  ?CREATININE 0.82  ?CALCIUM 8.7*  ? ? ?Intake/Output Summary (Last 24 hours) at 05/31/2021 1629 ?Last data filed at 05/31/2021 0800 ?Gross per 24 hour  ?Intake 238 ml  ?Output 600 ml  ?Net -362 ml  ?  ? ?  ? ?Physical Exam: ?Vital Signs ?Blood pressure (!) 135/92, pulse 81, temperature 98.4 ?F (36.9 ?C), temperature source Oral, resp. rate 18, height '5\' 9"'$  (1.753 m), weight 118 kg, SpO2 98 %. ? ? ? ? ? ? ?General: awake, alert, appropriate, laying in bed; finished 95+% of tray; NAD ?HENT: conjugate gaze; oropharynx moist ?CV: regular rate; no JVD ?Pulmonary: CTA B/L; no W/R/R- good air movement ?GI: soft, NT, ND, (+)BS ?Psychiatric: appropriate ?Neurological: Ox3 ?Tremor LUE/RUE- from PD ?GU- foley in place- attached correctly to leg- no change ?Extremities: No clubbing, cyanosis, or edema. Pulses are 2+ ?Psych: Pt's affect is appropriate. Pt is cooperative ?Skin: Clean and intact without signs of breakdown, chronic vasc changes in LE's ?Neuro:  Alert and oriented x 3. Normal insight and awareness. Intact Memory. Normal language and speech. Cranial nerve exam unremarkable. Masked facies. Mild cogwheeling in UE and Le's. Motor 4-5/5 in UE and 3-4/5 in LE's. No focal sensory findings ?Musculoskeletal: mild pain with ROM of hips and shoulder. Fair sitting posture  ? ? ?Assessment/Plan: ?1. Functional deficits which require 3+ hours per day of interdisciplinary therapy in a comprehensive inpatient rehab setting. ?Physiatrist is providing close team supervision and 24 hour management of active medical  problems listed below. ?Physiatrist and rehab team continue to assess barriers to discharge/monitor patient progress toward functional and medical goals ? ?Care Tool: ? ?Bathing ?   ?Body parts bathed by patient: Right arm, Left arm, Chest, Abdomen, Face, Front perineal area, Right upper leg, Left upper leg, Left lower leg, Right lower leg, Buttocks  ? Body parts bathed by helper: Right lower leg, Left lower leg, Buttocks ?  ?  ?Bathing assist Assist Level: Moderate Assistance - Patient 50 - 74% ?  ?  ?Upper Body Dressing/Undressing ?Upper body dressing   ?What is the patient wearing?: Pull over shirt ?   ?Upper body assist Assist Level: Set up assist ?   ?Lower Body Dressing/Undressing ?Lower body dressing ? ? ?   ?What is the patient wearing?: Incontinence brief, Pants ? ?  ? ?Lower body assist Assist for lower body dressing: Minimal Assistance - Patient > 75% ?   ? ?Toileting ?Toileting    ?Toileting assist Assist for toileting: Minimal Assistance - Patient > 75% ?  ?  ?Transfers ?Chair/bed transfer ? ?Transfers assist ?   ? ?Chair/bed transfer assist level: Minimal Assistance - Patient > 75% ?  ?  ?Locomotion ?Ambulation ? ? ?Ambulation assist ? ?   ? ?  Assist level: Moderate Assistance - Patient 50 - 74% ?Assistive device: Walker-rolling ?Max distance: 63  ? ?Walk 10 feet activity ? ? ?Assist ?   ? ?Assist level: Moderate Assistance - Patient - 50 - 74% ?Assistive device: Walker-rolling  ? ?Walk 50 feet activity ? ? ?Assist Walk 50 feet with 2 turns activity did not occur: Safety/medical concerns ? ?Assist level: Moderate Assistance - Patient - 50 - 74% ?Assistive device: Walker-rolling  ? ? ?Walk 150 feet activity ? ? ?Assist Walk 150 feet activity did not occur: Safety/medical concerns ? ?  ?  ?  ? ?Walk 10 feet on uneven surface  ?activity ? ? ?Assist Walk 10 feet on uneven surfaces activity did not occur: Safety/medical concerns ? ? ?  ?   ? ?Wheelchair ? ? ? ? ?Assist Is the patient using a wheelchair?:  Yes ?Type of Wheelchair: Manual ?  ? ?Wheelchair assist level: Dependent - Patient 0% ?Max wheelchair distance: 150'  ? ? ?Wheelchair 50 feet with 2 turns activity ? ? ? ?Assist ? ?  ?  ? ? ?Assist Level: Dependent - Patient 0%  ? ?Wheelchair 150 feet activity  ? ? ? ?Assist ?   ? ? ?Assist Level: Dependent - Patient 0%  ? ?Blood pressure (!) 135/92, pulse 81, temperature 98.4 ?F (36.9 ?C), temperature source Oral, resp. rate 18, height '5\' 9"'$  (1.753 m), weight 118 kg, SpO2 98 %. ? ?Medical Problem List and Plan: ?1. Functional deficits secondary to pseudo-exacerbation of parkinson's disease/disability ?            -patient may shower ?            -ELOS/Goals: 7-10 days supervision to min assist goals ? Continue CIR- PT, OT and SLP ?  ?2.  Antithrombotics: ?-DVT/anticoagulation:  Pharmaceutical: Xarelto ?            -antiplatelet therapy: n/a ?3. Pain Management: tramadol and tylenol prn. Appears fairly well managed at present ?            -monitor activity tolerance ? 5/9- will add lidoderm patches 8pm to 8am- advised pt to have nursing remove in AM ? 5/10- has helped some- con't regimen ? 5/11- needs to have patches changed daily.  ?4. Mood: team to provide ego support ?            -klonopin prn for anxiety ?            -antipsychotic agents: n/a ?5. Neuropsych: This patient is capable of making decisions on his own behalf. ?6. Skin/Wound Care: local skin, foley care ?7. Fluids/Electrolytes/Nutrition: encourage appropriate nutrition ?            -still with hypomagnesia--continue oral supplementation  ?8. Parkinson's Disease: ?            -amantadine, sinemet, comtan per baseline regimen ? -seems fairly well compensated ?9. AKI/Urine retention/BPH: ?            -Foley in place and will follow up with urology ([place]) in about 2 weeks ?            -finasteride and fesoterodine per urology ? 5/12- Urology wants foley to remain in place until d/c.  To f/u with Urology after d/c.  ?10. HTN: 5/7 remains quite  elevate ?            -continue norvasc, lopressor  ?            -add in benazepril which he used  in combo with amlodipine pta  ? 5/12- BP controlled- con't regimen ?11. Slow transit constipation: ?            -scheduled miralax and senna-s ?            -prn's ordered ?12. Hx of CAD/persistent A -fib --lopressor, lipitor, xarelto ?            -HR borderline at present ?            5/7 -Mg++ still only 1.6 but only received one dose so far of mag gluc '500mg'$  bid ?  -consider IVPB Mg++ 5/8 if still in the 1.5-6 range ?  -will add daily potassium as well as it's low as well ? 5/9- will replete K+ 40 mEq x2 and since Mg 1.4- will give IV Mg and recheck Thursday ? 5/10- labs in AM ? 5/11- Mg 1.8 and K+ 3.8- doing better ? 5/12- order labs for qmonday and monitor ?13. OSA: not currently using bipap ?14. Morbid obesity   ?15. Right Renal mass : c/w cystic lesion on MRI ? 16. Grief- ?Jun 09, 2022- Good friend died suddenly- will monitor closely- d/w nursing to keep an eye on him.  ?17. Constipation ? 5/11- miralax also given x1 in addition to bowel meds ?5/12- LBM yesterday ? ? ? ? ? ?LOS: ?6 days ?A FACE TO FACE EVALUATION WAS PERFORMED ? ?Isaiah Pena ?05/31/2021, 4:29 PM  ? ?  ?

## 2021-05-31 NOTE — Progress Notes (Signed)
Speech Language Pathology Daily Session Note ? ?Patient Details  ?Name: Isaiah Pena ?MRN: [id number] ?Date of Birth: 04-14-1950 ? ?Today's Date: 05/31/2021 ?SLP Individual Time: 6468-0321 ?SLP Individual Time Calculation (min): 44 min ? ?Short Term Goals: ?Week 1: SLP Short Term Goal 1 (Week 1): STG=LTG due to ELOS ? ?Skilled Therapeutic Interventions: ?Pt seen for skilled ST with focus on speech goals, pt upright in wheelchair with wife present for most of session. Pt reports practicing diaphragmatic breathing exercises independently in bed, speech appears more functional this date than evaluation yesterday. Pt able to communicate at conversation level for 45 minutes with <5 episodes of loss of air toward end of sentence. However, when pt did have episodes of decreased breath support/intelligibility, did not ID or self correct without cues for awareness. Wife reports his speech sounds better today. Pt re-educated on speech intelligibility strategies with primary focus on breath support and pacing. Pt verbalizing understanding, cont ST POC. ? ?Pain ?Pain Assessment ?Pain Scale: 0-10 ?Pain Score: 0-No pain ? ?Therapy/Group: Individual Therapy ? ?Dewaine Conger ?05/31/2021, 11:59 AM ?

## 2021-06-01 DIAGNOSIS — I4891 Unspecified atrial fibrillation: Secondary | ICD-10-CM

## 2021-06-01 DIAGNOSIS — K5900 Constipation, unspecified: Secondary | ICD-10-CM

## 2021-06-01 NOTE — Progress Notes (Signed)
?                                                       PROGRESS NOTE ? ? ?Subjective/Complaints: ? ? ?No new concerns or complaints. His wife is in the room.  ? ?ROS:  ? ?Pt denies SOB, abd pain, CP, N/V/C/D, and vision changes. No fever or chills ? ?Objective: ?  ?No results found. ?No results for input(s): WBC, HGB, HCT, PLT in the last 72 hours. ? ?Recent Labs  ?  05/30/21 ?0620  ?NA 142  ?K 3.8  ?CL 108  ?CO2 28  ?GLUCOSE 111*  ?BUN 16  ?CREATININE 0.82  ?CALCIUM 8.7*  ? ? ? ?Intake/Output Summary (Last 24 hours) at 06/01/2021 1207 ?Last data filed at 06/01/2021 0800 ?Gross per 24 hour  ?Intake 840 ml  ?Output 2675 ml  ?Net -1835 ml  ? ?  ? ?  ? ?Physical Exam: ?Vital Signs ?Blood pressure (!) 153/88, pulse 77, temperature 97.8 ?F (36.6 ?C), temperature source Oral, resp. rate 16, height '5\' 9"'$  (1.753 m), weight 118 kg, SpO2 95 %. ? ? ? ? ? ? ?General: awake, alert, appropriate, sitting in chair, NAD ?HENT: conjugate gaze; oropharynx moist ?CV: regular rate; no JVD ?Pulmonary: CTA B/L; no W/R/R- good air movement ?GI: soft, NT, ND, (+)BS ?Psychiatric: appropriate ?Neurological: Ox3 ?Tremor LUE/RUE- from PD ?GU- foley in place- attached correctly to leg- no change, clear yellow urine ?Extremities: No clubbing, cyanosis, or edema. Pulses are 2+ ?Psych: Pt's affect is appropriate. Pt is cooperative ?Skin: Clean and intact without signs of breakdown, chronic vasc changes in LE's ?Neuro:  Alert and oriented x 3. Normal insight and awareness. Intact Memory. Normal language and speech. Cranial nerve exam unremarkable. Masked facies. Mild cogwheeling in UE and Le's. Motor 4-5/5 in UE and 3-4/5 in LE's. No focal sensory findings ?Musculoskeletal: mild pain with ROM of hips and shoulder. Fair sitting posture  ? ? ?Assessment/Plan: ?1. Functional deficits which require 3+ hours per day of interdisciplinary therapy in a comprehensive inpatient rehab setting. ?Physiatrist is providing close team supervision and 24 hour  management of active medical problems listed below. ?Physiatrist and rehab team continue to assess barriers to discharge/monitor patient progress toward functional and medical goals ? ?Care Tool: ? ?Bathing ?   ?Body parts bathed by patient: Right arm, Left arm, Chest, Abdomen, Face, Front perineal area, Right upper leg, Left upper leg, Left lower leg, Right lower leg, Buttocks  ? Body parts bathed by helper: Right lower leg, Left lower leg, Buttocks ?  ?  ?Bathing assist Assist Level: Moderate Assistance - Patient 50 - 74% ?  ?  ?Upper Body Dressing/Undressing ?Upper body dressing   ?What is the patient wearing?: Pull over shirt ?   ?Upper body assist Assist Level: Set up assist ?   ?Lower Body Dressing/Undressing ?Lower body dressing ? ? ?   ?What is the patient wearing?: Incontinence brief, Pants ? ?  ? ?Lower body assist Assist for lower body dressing: Minimal Assistance - Patient > 75% ?   ? ?Toileting ?Toileting    ?Toileting assist Assist for toileting: Minimal Assistance - Patient > 75% ?  ?  ?Transfers ?Chair/bed transfer ? ?Transfers assist ?   ? ?Chair/bed transfer assist level: Minimal Assistance - Patient > 75% ?  ?  ?Locomotion ?  Ambulation ? ? ?Ambulation assist ? ?   ? ?Assist level: Moderate Assistance - Patient 50 - 74% ?Assistive device: Walker-rolling ?Max distance: 48  ? ?Walk 10 feet activity ? ? ?Assist ?   ? ?Assist level: Moderate Assistance - Patient - 50 - 74% ?Assistive device: Walker-rolling  ? ?Walk 50 feet activity ? ? ?Assist Walk 50 feet with 2 turns activity did not occur: Safety/medical concerns ? ?Assist level: Moderate Assistance - Patient - 50 - 74% ?Assistive device: Walker-rolling  ? ? ?Walk 150 feet activity ? ? ?Assist Walk 150 feet activity did not occur: Safety/medical concerns ? ?  ?  ?  ? ?Walk 10 feet on uneven surface  ?activity ? ? ?Assist Walk 10 feet on uneven surfaces activity did not occur: Safety/medical concerns ? ? ?  ?   ? ?Wheelchair ? ? ? ? ?Assist Is the  patient using a wheelchair?: Yes ?Type of Wheelchair: Manual ?  ? ?Wheelchair assist level: Dependent - Patient 0% ?Max wheelchair distance: 150'  ? ? ?Wheelchair 50 feet with 2 turns activity ? ? ? ?Assist ? ?  ?  ? ? ?Assist Level: Dependent - Patient 0%  ? ?Wheelchair 150 feet activity  ? ? ? ?Assist ?   ? ? ?Assist Level: Dependent - Patient 0%  ? ?Blood pressure (!) 153/88, pulse 77, temperature 97.8 ?F (36.6 ?C), temperature source Oral, resp. rate 16, height '5\' 9"'$  (1.753 m), weight 118 kg, SpO2 95 %. ? ?Medical Problem List and Plan: ?1. Functional deficits secondary to pseudo-exacerbation of parkinson's disease/disability ?            -patient may shower ?            -ELOS/Goals: 7-10 days supervision to min assist goals ? Continue CIR- PT, OT and SLP ? ?2.  Antithrombotics: ?-DVT/anticoagulation:  Pharmaceutical: Xarelto ?            -antiplatelet therapy: n/a ?3. Pain Management: tramadol and tylenol prn. Appears fairly well managed at present ?            -monitor activity tolerance ? 5/9- will add lidoderm patches 8pm to 8am- advised pt to have nursing remove in AM ? 5/10- has helped some- con't regimen ? 5/11- needs to have patches changed daily.  ?4. Mood: team to provide ego support ?            -klonopin prn for anxiety ?            -antipsychotic agents: n/a ?5. Neuropsych: This patient is capable of making decisions on his own behalf. ?6. Skin/Wound Care: local skin, foley care ?7. Fluids/Electrolytes/Nutrition: encourage appropriate nutrition ?            -still with hypomagnesia--continue oral supplementation  ?8. Parkinson's Disease: ?            -amantadine, sinemet, comtan per baseline regimen ? -seems fairly well compensated ?9. AKI/Urine retention/BPH: ?            -Foley in place and will follow up with urology ([place]) in about 2 weeks ?            -finasteride and fesoterodine per urology ? 5/12- Urology wants foley to remain in place until d/c.  To f/u with Urology after d/c.  ? -5/13  Wife reports urology f/u is scheduled after D/C, continue current plan ?10. HTN: 5/7 remains quite elevate ?            -  continue norvasc, lopressor  ?            -add in benazepril which he used in combo with amlodipine pta  ? 5/12- BP controlled- con't regimen ?11. Slow transit constipation: ?            -scheduled miralax and senna-s ?            -prn's ordered ?12. Hx of CAD/persistent A -fib --lopressor, lipitor, xarelto ?            -HR borderline at present ?            5/7 -Mg++ still only 1.6 but only received one dose so far of mag gluc '500mg'$  bid ?  -consider IVPB Mg++ 5/8 if still in the 1.5-6 range ?  -will add daily potassium as well as it's low as well ? 5/9- will replete K+ 40 mEq x2 and since Mg 1.4- will give IV Mg and recheck Thursday ? 5/10- labs in AM ? 5/11- Mg 1.8 and K+ 3.8- doing better ? 5/12- order labs for qmonday and monitor ? 5/13 Rate is stable, continue current medications ?13. OSA: not currently using bipap ?14. Morbid obesity   ?15. Right Renal mass : c/w cystic lesion on MRI ? 16. Grief- ?Jun 06, 2022- Good friend died suddenly- will monitor closely- d/w nursing to keep an eye on him.  ?17. Constipation ? 5/11- miralax also given x1 in addition to bowel meds ?5/12- LBM yesterday ?5/13 BM today,, continue miralax and senokot ? ? ? ? ? ?LOS: ?7 days ?A FACE TO FACE EVALUATION WAS PERFORMED ? ?Jennye Boroughs ?06/01/2021, 12:07 PM  ? ?  ?

## 2021-06-01 NOTE — Plan of Care (Addendum)
?  Problem: RH Eating ?Goal: LTG Patient will perform eating w/assist, cues/equip (OT) ?Description: LTG: Patient will perform eating with assist, with/without cues using equipment (OT) ?Outcome: Met ?Flowsheets (Taken 06/01/2021 1250) ?LTG: Pt will perform eating with assistance level of: Set up assist  ?  ?Problem: RH Grooming ?Goal: LTG Patient will perform grooming w/assist,cues/equip (OT) ?Description: LTG: Patient will perform grooming with assist, with/without cues using equipment (OT) ?Outcome: Progressing ?Flowsheets (Taken 06/01/2021 1250) ?LTG: Pt will perform grooming with assistance level of: Contact Guard/Touching assist ?  ?Problem: RH Bathing ?Goal: LTG Patient will bathe all body parts with assist levels (OT) ?Description: LTG: Patient will bathe all body parts with assist levels (OT) ?Outcome: Progressing ?Flowsheets (Taken 06/01/2021 1250) ?LTG: Pt will perform bathing with assistance level/cueing: Minimal Assistance - Patient > 75% ?  ?Problem: RH Dressing ?Goal: LTG Patient will perform upper body dressing (OT) ?Description: LTG Patient will perform upper body dressing with assist, with/without cues (OT). ?Outcome: Progressing ?Flowsheets (Taken 06/01/2021 1250) ?LTG: Pt will perform upper body dressing with assistance level of: Contact Guard/Touching assist ?Goal: LTG Patient will perform lower body dressing w/assist (OT) ?Description: LTG: Patient will perform lower body dressing with assist, with/without cues in positioning using equipment (OT) ?Outcome: Progressing ?Flowsheets (Taken 06/01/2021 1250) ?LTG: Pt will perform lower body dressing with assistance level of: Minimal Assistance - Patient > 75% ?  ?Problem: RH Toileting ?Goal: LTG Patient will perform toileting task (3/3 steps) with assistance level (OT) ?Description: LTG: Patient will perform toileting task (3/3 steps) with assistance level (OT)  ?Outcome: Progressing ?Flowsheets (Taken 06/01/2021 1250) ?LTG: Pt will perform toileting task  (3/3 steps) with assistance level: Minimal Assistance - Patient > 75% ?  ?Problem: RH Toilet Transfers ?Goal: LTG Patient will perform toilet transfers w/assist (OT) ?Description: LTG: Patient will perform toilet transfers with assist, with/without cues using equipment (OT) ?Outcome: Progressing ?Flowsheets (Taken 06/01/2021 1250) ?LTG: Pt will perform toilet transfers with assistance level of: Contact Guard/Touching assist ?  ?Problem: RH Tub/Shower Transfers ?Goal: LTG Patient will perform tub/shower transfers w/assist (OT) ?Description: LTG: Patient will perform tub/shower transfers with assist, with/without cues using equipment (OT) ?Outcome: Progressing ?Flowsheets (Taken 06/01/2021 1250) ?LTG: Pt will perform tub/shower stall transfers with assistance level of: Minimal Assistance - Patient > 75% ?  ?

## 2021-06-01 NOTE — Progress Notes (Signed)
Occupational Therapy Weekly Progress Note ? ?Patient Details  ?Name: Isaiah Pena ?MRN: [id number] ?Date of Birth: [birth year] ? ?Beginning of progress report period: May 26, 2021 ?End of progress report period: May 30, 2021 ? ?Today's Date: 06/01/2021 ?OT Individual Time: 1100-1200 ?OT Individual Time Calculation (min): 60 min  ? ? ?Patient has met 1 of 6  short term goals. Pt approaching all goals met however due to LOS until Wed 06/05/21- LTG's will be carried over as STG's until d/c. Pt is extremely motivated to achieve min A-CGA level for discharge home with wife mid-next week. Pt requires continued OT services to decrease caregiver burden and maximize pt's independence and safety.  ? ?Patient continues to demonstrate the following deficits: muscle weakness and muscle joint tightness, decreased cardiorespiratoy endurance, and impaired timing and sequencing, decreased coordination, and decreased motor planning and therefore will continue to benefit from skilled OT intervention to enhance overall performance with BADL and iADL. ? ?Patient progressing toward long term goals..  Continue plan of care. ? ?OT Short Term Goals ?Week 2:   STG=LTG (set initially but LOS exceeded 7 days thus continue LTG's) ? ?Skilled Therapeutic Interventions/Progress Updates:  ? ?Pt in bed upon OT arrival this am. Pt reports he "had a rough am". Reports he waited for NT after calling call bell to take him to bathroom for BM and he was unable to move his bowels then brought back to bed. OT training from bed to bathroom for toileting then sink side self care this session. OT assisted throughout session for management of Foley and tubing. Required min A overall for supine to sit at EOB, cues and CGA for sit to stand with RW due to need for forward weight shifts and delayed motor planning. Amb with RW 15 ft with CGA, transfer to and from elevated commode with grab bar with CGA. Min-mod A for peri hygiene following BM. Amb to w/c placed at sink  side for UB/LB sponge bathing and dressing routine. Use of AE including reacher, [place] for LB dressing utilized. See updated goals for current functional levels. Stood 4 trials with RW at sink for mouth care 2 minute intervals with CGA. Pt's wife arrived and OT care coordination and brief family education for discharge Wed conducted. No additional OT DME is needed as pt has shower and toileting DME in place. OT left in the care of wife with needs and call bell in place.  ? ? ?Therapy/Group: Individual Therapy ? ?Isaiah Pena ?06/01/2021, 12:46 PM  ?

## 2021-06-02 DIAGNOSIS — N179 Acute kidney failure, unspecified: Secondary | ICD-10-CM

## 2021-06-02 MED ORDER — ALFUZOSIN HCL ER 10 MG PO TB24: 10.0000 mg | ORAL_TABLET | Freq: Every day | ORAL | Status: DC

## 2021-06-02 NOTE — Progress Notes (Signed)
?                                                       PROGRESS NOTE ? ? ?Subjective/Complaints: ? ? ?In bed this AM. No concerns. Reports he feels well overall ? ?ROS:  ? ?Pt denies SOB, abd pain, CP, N/V/C/D, and vision changes.no HA ? ?Objective: ?  ?No results found. ?No results for input(s): WBC, HGB, HCT, PLT in the last 72 hours. ? ?No results for input(s): NA, K, CL, CO2, GLUCOSE, BUN, CREATININE, CALCIUM in the last 72 hours. ? ? ?Intake/Output Summary (Last 24 hours) at 06/02/2021 1235 ?Last data filed at 06/02/2021 0757 ?Gross per 24 hour  ?Intake 600 ml  ?Output 1400 ml  ?Net -800 ml  ? ?  ? ?  ? ?Physical Exam: ?Vital Signs ?Blood pressure 134/88, pulse 72, temperature 98.4 ?F (36.9 ?C), temperature source Oral, resp. rate 16, height '5\' 9"'$  (1.753 m), weight 115.3 kg, SpO2 97 %. ? ? ? ? ? ? ?General: awake, alert, appropriate, sitting in bed  NAD ?HENT: conjugate gaze; oropharynx moist ?CV: regular rate; no JVD ?Pulmonary: CTA B/L; no W/R/R- good air movement, non-labored breathing ?GI: soft, NT, ND, (+)BS ?Psychiatric: appropriate, pleasant ?Neurological: Ox3 ?Tremor LUE/RUE- from PD ?GU- foley in place- attached correctly to leg ?Extremities: No clubbing, cyanosis, or edema. Pulses are 2+ ?Psych: Pt's affect is appropriate. Pt is cooperative ?Skin: Clean and intact without signs of breakdown, chronic vasc changes in LE's ?Neuro:  Alert and oriented x 3. Normal insight and awareness. Intact Memory. Normal language and speech. Cranial nerve exam unremarkable. Masked facies. Mild cogwheeling in UE and Le's. Motor 4-5/5 in UE and 3-4/5 in LE's. No focal sensory findings ?Musculoskeletal: mild pain with ROM of hips and shoulder.  ? ? ?Assessment/Plan: ?1. Functional deficits which require 3+ hours per day of interdisciplinary therapy in a comprehensive inpatient rehab setting. ?Physiatrist is providing close team supervision and 24 hour management of active medical problems listed below. ?Physiatrist and  rehab team continue to assess barriers to discharge/monitor patient progress toward functional and medical goals ? ?Care Tool: ? ?Bathing ?   ?Body parts bathed by patient: Right arm, Left arm, Chest, Abdomen, Face, Front perineal area, Right upper leg, Left upper leg, Left lower leg, Right lower leg, Buttocks  ? Body parts bathed by helper: Right lower leg, Left lower leg, Buttocks ?  ?  ?Bathing assist Assist Level: Moderate Assistance - Patient 50 - 74% ?  ?  ?Upper Body Dressing/Undressing ?Upper body dressing   ?What is the patient wearing?: Pull over shirt ?   ?Upper body assist Assist Level: Set up assist ?   ?Lower Body Dressing/Undressing ?Lower body dressing ? ? ?   ?What is the patient wearing?: Incontinence brief, Pants ? ?  ? ?Lower body assist Assist for lower body dressing: Minimal Assistance - Patient > 75% ?   ? ?Toileting ?Toileting    ?Toileting assist Assist for toileting: Minimal Assistance - Patient > 75% ?  ?  ?Transfers ?Chair/bed transfer ? ?Transfers assist ?   ? ?Chair/bed transfer assist level: Minimal Assistance - Patient > 75% ?  ?  ?Locomotion ?Ambulation ? ? ?Ambulation assist ? ?   ? ?Assist level: Moderate Assistance - Patient 50 - 74% ?Assistive device: Walker-rolling ?Max distance: 65  ? ?  Walk 10 feet activity ? ? ?Assist ?   ? ?Assist level: Moderate Assistance - Patient - 50 - 74% ?Assistive device: Walker-rolling  ? ?Walk 50 feet activity ? ? ?Assist Walk 50 feet with 2 turns activity did not occur: Safety/medical concerns ? ?Assist level: Moderate Assistance - Patient - 50 - 74% ?Assistive device: Walker-rolling  ? ? ?Walk 150 feet activity ? ? ?Assist Walk 150 feet activity did not occur: Safety/medical concerns ? ?  ?  ?  ? ?Walk 10 feet on uneven surface  ?activity ? ? ?Assist Walk 10 feet on uneven surfaces activity did not occur: Safety/medical concerns ? ? ?  ?   ? ?Wheelchair ? ? ? ? ?Assist Is the patient using a wheelchair?: Yes ?Type of Wheelchair: Manual ?   ? ?Wheelchair assist level: Dependent - Patient 0% ?Max wheelchair distance: 150'  ? ? ?Wheelchair 50 feet with 2 turns activity ? ? ? ?Assist ? ?  ?  ? ? ?Assist Level: Dependent - Patient 0%  ? ?Wheelchair 150 feet activity  ? ? ? ?Assist ?   ? ? ?Assist Level: Dependent - Patient 0%  ? ?Blood pressure 134/88, pulse 72, temperature 98.4 ?F (36.9 ?C), temperature source Oral, resp. rate 16, height '5\' 9"'$  (1.753 m), weight 115.3 kg, SpO2 97 %. ? ?Medical Problem List and Plan: ?1. Functional deficits secondary to pseudo-exacerbation of parkinson's disease/disability ?            -patient may shower ?            -ELOS/Goals: 7-10 days supervision to min assist goals ? Continue CIR- PT, OT and SLP ? -Walked 104f with RW ?2.  Antithrombotics: ?-DVT/anticoagulation:  Pharmaceutical: Xarelto ?            -antiplatelet therapy: n/a ?3. Pain Management: tramadol and tylenol prn. Appears fairly well managed at present ?            -monitor activity tolerance ? 5/9- will add lidoderm patches 8pm to 8am- advised pt to have nursing remove in AM ? 5/10- has helped some- con't regimen ? 5/11- needs to have patches changed daily.  ?4. Mood: team to provide ego support ?            -klonopin prn for anxiety ?            -antipsychotic agents: n/a ?5. Neuropsych: This patient is capable of making decisions on his own behalf. ?6. Skin/Wound Care: local skin, foley care ?7. Fluids/Electrolytes/Nutrition: encourage appropriate nutrition ?            -still with hypomagnesia--continue oral supplementation  ?8. Parkinson's Disease: ?            -amantadine, sinemet, comtan per baseline regimen ? -seems fairly well compensated ?9. AKI/Urine retention/BPH: ?            -Foley in place and [place] ?            -finasteride and fesoterodine per urology ? -5/12- Urology wants foley to remain in place until d/c.  To f/u with Urology after d/c.  ? -5/13 Wife reports urology f/u is scheduled  after D/C, continue current plan ? Repeat labs tomorrow ?10. HTN: 5/7 remains quite elevate ?            -continue norvasc, lopressor  ?            -add in benazepril which  he used in combo with amlodipine pta  ? 5/12- BP controlled- con't regimen ?11. Slow transit constipation: ?            -scheduled miralax and senna-s ?            -prn's ordered ?12. Hx of CAD/persistent A -fib --lopressor, lipitor, xarelto ?            -HR borderline at present ?            5/7 -Mg++ still only 1.6 but only received one dose so far of mag gluc '500mg'$  bid ?  -consider IVPB Mg++ 5/8 if still in the 1.5-6 range ?  -will add daily potassium as well as it's low as well ? 5/9- will replete K+ 40 mEq x2 and since Mg 1.4- will give IV Mg and recheck Thursday ? 5/10- labs in AM ? 5/11- Mg 1.8 and K+ 3.8- doing better ? 5/12- order labs for qmonday and monitor ? 5/14 rate stable, repeat labs tomorrow ?13. OSA: not currently using bipap ?14. Morbid obesity   ?15. Right Renal mass : c/w cystic lesion on MRI ? 16. Grief- ?06-20-2022- Good friend died suddenly- will monitor closely- d/w nursing to keep an eye on him.  ?17. Constipation ? 5/11- miralax also given x1 in addition to bowel meds ?5/14 BM today and yesterday, continue current regimen ? ? ? ? ? ?LOS: ?8 days ?A FACE TO FACE EVALUATION WAS PERFORMED ? ?Jennye Boroughs ?06/02/2021, 12:35 PM  ? ?  ?

## 2021-06-02 NOTE — Progress Notes (Signed)
Physical Therapy Session Note ? ?Patient Details  ?Name: Isaiah Pena ?MRN: [id number] ?Date of Birth: Nov 08, 1950 ? ?Today's Date: 06/02/2021 ?PT Individual Time: 1300-1400 ?PT Individual Time Calculation (min): 60 min  ? ?Short Term Goals: ?Week 1:  PT Short Term Goal 1 (Week 1): =LTG due to ELOS ? ?Skilled Therapeutic Interventions/Progress Updates: Pt presents sitting in recliner and eager for therapy.  Pt transferred sit to stand w/ min A and amb x 4' to w/c.  Pt wheeled to outside terrain w/ total A, w/ 1 stop for walker tip balls for improved gliding.  Pt requires min A and verbal cues for sit to stand transfers for sequencing to improve forward lean and WB through toes.  Pt amb multiple trials w/ RW and min A, verbal cues and counting method to improve foot clearance and step length.  Pt transferred from w/c as well as armless seat using table top for sit to stand.  Pt amb 90' including turns on uneven surfaces.  Pt returned to room and amb into room from hallway approx. 40' to recliner.  Pt remained sitting in recliner w/ all needs in reach and chair alarm on. ?   ? ?Therapy Documentation ?Precautions:  ?Precautions ?Precautions: Fall ?Precaution Comments: PD at baseline ?Restrictions ?Weight Bearing Restrictions: No ?General: ?  ?Vital Signs: ?  ?Pain:6/10 ?  ? ? ? ?Therapy/Group: Individual Therapy ? ?Ladoris Gene ?06/02/2021, 2:04 PM  ?

## 2021-06-03 LAB — BASIC METABOLIC PANEL
Anion gap: 7 (ref 5–15)
BUN: 16 mg/dL (ref 8–23)
CO2: 28 mmol/L (ref 22–32)
Calcium: 8.6 mg/dL — ABNORMAL LOW (ref 8.9–10.3)
Chloride: 104 mmol/L (ref 98–111)
Creatinine, Ser: 0.85 mg/dL (ref 0.61–1.24)
GFR, Estimated: >60 mL/min (ref >60)
Glucose, Bld: 122 mg/dL — ABNORMAL HIGH (ref 70–99)
Potassium: 3.2 mmol/L — ABNORMAL LOW (ref 3.5–5.1)
Sodium: 139 mmol/L (ref 135–145)

## 2021-06-03 LAB — MAGNESIUM: Magnesium: 1.7 mg/dL (ref 1.7–2.4)

## 2021-06-03 MED ORDER — POTASSIUM CHLORIDE CRYS ER 20 MEQ PO TBCR
20.0000 meq | EXTENDED_RELEASE_TABLET | Freq: Two times a day (BID) | ORAL | Status: DC
  Administered 2021-06-03 – 2021-06-04 (×2): 20 meq via ORAL
  Filled 2021-06-03 (×2): qty 1

## 2021-06-03 NOTE — Progress Notes (Signed)
?                                                       PROGRESS NOTE ? ? ?Subjective/Complaints: ? ?No issues overnite , had BM this am , working with SLP  ?ROS:  ? ?Pt denies SOB, abd pain, CP, N/V/C/D, and vision changes.no HA ? ?Objective: ?  ?No results found. ?No results for input(s): WBC, HGB, HCT, PLT in the last 72 hours. ? ?Recent Labs  ?  06/03/21 ?0807  ?NA 139  ?K 3.2*  ?CL 104  ?CO2 28  ?GLUCOSE 122*  ?BUN 16  ?CREATININE 0.85  ?CALCIUM 8.6*  ? ? ? ?Intake/Output Summary (Last 24 hours) at 06/03/2021 1025 ?Last data filed at 06/03/2021 3428 ?Gross per 24 hour  ?Intake 600 ml  ?Output 1100 ml  ?Net -500 ml  ? ?  ? ?  ? ?Physical Exam: ?Vital Signs ?Blood pressure (!) 161/89, pulse 71, temperature 97.7 ?F (36.5 ?C), temperature source Oral, resp. rate 18, height '5\' 9"'$  (1.753 m), weight 115.3 kg, SpO2 98 %. ? ? ? ? ? ? ?General: No acute distress ?Mood and affect are appropriate ?Heart: Regular rate and rhythm no rubs murmurs or extra sounds ?Lungs: Clear to auscultation, breathing unlabored, no rales or wheezes ?Abdomen: Positive bowel sounds, soft nontender to palpation, nondistended ?Extremities: No clubbing, cyanosis, or edema ? ? ?GU- foley in place- attached correctly to leg ?Extremities: No clubbing, cyanosis, or edema. Pulses are 2+ ?Psych: Pt's affect is appropriate. Pt is cooperative ?Skin: Clean and intact without signs of breakdown, chronic vasc changes in LE's ?Neuro:  Alert and oriented x 3. Normal insight and awareness. Intact Memory. Normal language and speech. Cranial nerve exam unremarkable. Masked facies. Mild cogwheeling in UE and Le's. Motor 4-5/5 in UE and 3-4/5 in LE's. No focal sensory findings ?Musculoskeletal: mild pain with ROM of hips and shoulder.  ? ? ?Assessment/Plan: ?1. Functional deficits which require 3+ hours per day of interdisciplinary therapy in a comprehensive inpatient rehab setting. ?Physiatrist is providing close team supervision and 24 hour management of active  medical problems listed below. ?Physiatrist and rehab team continue to assess barriers to discharge/monitor patient progress toward functional and medical goals ? ?Care Tool: ? ?Bathing ?   ?Body parts bathed by patient: Right arm, Left arm, Chest, Abdomen, Face, Front perineal area, Right upper leg, Left upper leg, Left lower leg, Right lower leg, Buttocks  ? Body parts bathed by helper: Right lower leg, Left lower leg, Buttocks ?  ?  ?Bathing assist Assist Level: Moderate Assistance - Patient 50 - 74% ?  ?  ?Upper Body Dressing/Undressing ?Upper body dressing   ?What is the patient wearing?: Pull over shirt ?   ?Upper body assist Assist Level: Set up assist ?   ?Lower Body Dressing/Undressing ?Lower body dressing ? ? ?   ?What is the patient wearing?: Incontinence brief, Pants ? ?  ? ?Lower body assist Assist for lower body dressing: Minimal Assistance - Patient > 75% ?   ? ?Toileting ?Toileting    ?Toileting assist Assist for toileting: Minimal Assistance - Patient > 75% ?  ?  ?Transfers ?Chair/bed transfer ? ?Transfers assist ?   ? ?Chair/bed transfer assist level: Minimal Assistance - Patient > 75% ?  ?  ?Locomotion ?Ambulation ? ? ?Ambulation  assist ? ?   ? ?Assist level: Minimal Assistance - Patient > 75% ?Assistive device: Walker-rolling ?Max distance: 90  ? ?Walk 10 feet activity ? ? ?Assist ?   ? ?Assist level: Minimal Assistance - Patient > 75% ?Assistive device: Walker-rolling  ? ?Walk 50 feet activity ? ? ?Assist Walk 50 feet with 2 turns activity did not occur: Safety/medical concerns ? ?Assist level: Minimal Assistance - Patient > 75% ?Assistive device: Walker-rolling  ? ? ?Walk 150 feet activity ? ? ?Assist Walk 150 feet activity did not occur: Safety/medical concerns ? ?  ?  ?  ? ?Walk 10 feet on uneven surface  ?activity ? ? ?Assist Walk 10 feet on uneven surfaces activity did not occur: Safety/medical concerns ? ? ?Assist level: Minimal Assistance - Patient > 75% ?Assistive device: Walker-rolling   ? ?Wheelchair ? ? ? ? ?Assist Is the patient using a wheelchair?: Yes ?Type of Wheelchair: Manual ?  ? ?Wheelchair assist level: Dependent - Patient 0% ?Max wheelchair distance: 150'  ? ? ?Wheelchair 50 feet with 2 turns activity ? ? ? ?Assist ? ?  ?  ? ? ?Assist Level: Dependent - Patient 0%  ? ?Wheelchair 150 feet activity  ? ? ? ?Assist ?   ? ? ?Assist Level: Dependent - Patient 0%  ? ?Blood pressure (!) 161/89, pulse 71, temperature 97.7 ?F (36.5 ?C), temperature source Oral, resp. rate 18, height '5\' 9"'$  (1.753 m), weight 115.3 kg, SpO2 98 %. ? ?Medical Problem List and Plan: ?1. Functional deficits secondary to pseudo-exacerbation of parkinson's disease/disability ?            -patient may shower ?            -ELOS/Goals: 7-10 days supervision to min assist goals ? Continue CIR- PT, OT and SLP ? -Walked 35f with RW ?2.  Antithrombotics: ?-DVT/anticoagulation:  Pharmaceutical: Xarelto ?            -antiplatelet therapy: n/a ?3. Pain Management: tramadol and tylenol prn. Appears fairly well managed at present ?            -monitor activity tolerance ? 5/9- will add lidoderm patches 8pm to 8am- advised pt to have nursing remove in AM ? 5/10- has helped some- con't regimen ? 5/11- needs to have patches changed daily.  ?4. Mood: team to provide ego support ?            -klonopin prn for anxiety ?            -antipsychotic agents: n/a ?5. Neuropsych: This patient is capable of making decisions on his own behalf. ?6. Skin/Wound Care: local skin, foley care ?7. Fluids/Electrolytes/Nutrition: encourage appropriate nutrition ?            5/15 Hypo K+ increase KCL to BID, recheck in am .  hypomagnesia-resolved now in 1.7-1.8 range ?8. Parkinson's Disease: ?            -amantadine, sinemet, comtan per baseline regimen ? -seems fairly well compensated ?9. AKI/Urine retention/BPH: ?            -Foley in place and [place] ?            -finasteride and fesoterodine per  urology ? -5/12- Urology wants foley to remain in place until d/c.  To f/u with Urology after d/c.  ? -5/13 Wife reports urology f/u is scheduled after D/C, continue current plan ? Repeat labs tomorrow ?10.  HTN: 5/7 remains quite elevate ?            -continue norvasc, lopressor  ?            -add in benazepril which he used in combo with amlodipine pta  ?  ?Vitals:  ? 06/02/21 1910 06/03/21 0453  ?BP: 125/89 (!) 161/89  ?Pulse: 75 71  ?Resp: 18 18  ?Temp: 98.3 ?F (36.8 ?C) 97.7 ?F (36.5 ?C)  ?SpO2: 97% 98%  ?Elevated this am but generally well controlled - monitor on current regimen  ? ?11. Slow transit constipation: ?            -scheduled miralax and senna-s ?            -prn's ordered ?12. Hx of CAD/persistent A -fib --lopressor, lipitor, xarelto ?       BP/HR controled  ?13. OSA: not currently using bipap ?14. Morbid obesity   ?15. Right Renal mass : c/w cystic lesion on MRI ? 16. Grief- ?Jun 09, 2022- Good friend died suddenly- will monitor closely- d/w nursing to keep an eye on him.  ?17. Constipation ? Improved 5/15 ? ? ?LOS: ?9 days ?A FACE TO FACE EVALUATION WAS PERFORMED ? ?Isaiah Pena ?06/03/2021, 10:25 AM  ? ?  ?

## 2021-06-03 NOTE — Progress Notes (Signed)
Physical Therapy Session Note ? ?Patient Details  ?Name: Isaiah Pena ?MRN: [id number] ?Date of Birth: Sep 01, 1950 ? ?Today's Date: 06/03/2021 ?PT Individual Time: 6144-3154 ?PT Individual Time Calculation (min): 45 min  ? ?Short Term Goals: ?Week 1:  PT Short Term Goal 1 (Week 1): =LTG due to ELOS ?Week 2:    ?Week 3:    ?Week 4:    ? ?Skilled Therapeutic Interventions/Progress Updates:  ? PAIN denies pain ?Sit to stand from recliner to RW w/cga, pt cues himself for "nose over knees".   ?Short distance gait to wc/turn/sit w/cga, cues to use normalized step lenfth vs shuffling Repeats in reverse to return to recliner in same manner at end of session. ?Transported to gym ? ?Gait: 51f w/Rw and cga, cues for "1,2" increased step length vs shuffling tendency.  Pt w/improved gait stabiliy/ptt no longer attempting to lift rear of walker as in prior session due to decreased friction.   ?Discussed/demonstrated increased fall risk/mechanism of falls resulting from shufling gait/retrogait. ? ?Stair training: ?Practiced ascending/descending 4steps w/single rail on L to simulate home setup.  Cga and heavy cueing w/descending for lead foot placement on step/allowing space for both feet.  Improved w/second trial and w/staggered foot placement technique.   ?Pt discussed fear of falling on stairs due to prior fall descending.  Therapist demonstrated use of shower chair via repeated sit to/from stand as alternative method esp if fatigued/when PD symptoms more pronounced.  Session timed out so unable to practice physically during this session. ? ?Pt left oob in recliner w/chair alarm set and needs in reach. ? ?Therapy Documentation ?Precautions:  ?Precautions ?Precautions: Fall ?Precaution Comments: PD at baseline ?Restrictions ?Weight Bearing Restrictions: No ?Other Treatments:   ? ? ? ?Therapy/Group: Individual Therapy ?BCallie Fielding PT ? ? ?BJerrilyn Cairo?06/03/2021, 12:16 PM  ?

## 2021-06-03 NOTE — Progress Notes (Signed)
Occupational Therapy Session Note ? ?Patient Details  ?Name: Isaiah Pena ?MRN: [id number] ?Date of Birth: 08/01/1950 ? ?Today's Date: 06/03/2021 ?OT Individual Time: 1445-1530 ?OT Individual Time Calculation (min): 45 min  ? ? ?Short Term Goals: ?Week 1:  OT Short Term Goal 1 (Week 1): STGs=LTGs due to ELOS ?OT Short Term Goal 1 - Progress (Week 1): Progressing toward goal ? ?Skilled Therapeutic Interventions/Progress Updates:  ?  Pt received in recliner. He had just returned to room from attending an exercise group that pt stated was a "tough workout". ?Discussed his upcoming discharge and what areas he would like to be more independent with. He stated he has a bidet at home but here he does need more A with clothing management and cleansing.  Had pt practice sit to stand from low recliner.  Only slight cues needed to push up with B hands and shift his weight forward to stand.  PATIENT CONSISTENTLY STOOD UP AND SAT DOWN FROM HIS LOW RECLINER WITH ONLY SUPERVISION!!!  He practiced at least 8 sit to stands.  Once standing, he used Rw for light support but able to release 1 hand at a time to reach down to his ankle level and stand to simulate if he was pulling his pants up for dressing and toileting. He was able to balance with supervision.  ? ?He stated that at home if he sits on a soft low piece of furniture he has trouble with leverage getting up if no arm rests. Practiced this with pt and it was difficulty for him to push up with his hands on same surface as his hips. Suggested he try yoga blocks for increased leverage.  Pt also stated that he is careful to only sit on things he feels confident about getting up from.   ? ?In standing, had pt work on large AROM of arms with overhead reach alternating arms and heel raises. Suggested that he do 2-3 quick alt overhead reaches and a few heel raised to prep his body for movement. Pt then did this and practiced ambulation into hallway and back to his room with CGA and  good step length. ? ?From seated had pt work on large AROM of UEs. Pt previously was in the [facility] program, encouraged him to participate in that again.   ? ?Pt resting in recliner with all needs met.   ? ?Therapy Documentation ?Precautions:  ?Precautions ?Precautions: Fall ?Precaution Comments: PD at baseline ?Restrictions ?Weight Bearing Restrictions: No ? ? ?Pain: ?No c/o pain during OT session ? ? ? ?Therapy/Group: Individual Therapy ? ?[place] ?06/03/2021, 10:12 AM ?

## 2021-06-03 NOTE — Progress Notes (Signed)
Speech Language Pathology Daily Session Note ? ?Patient Details  ?Name: VANDEN FAWAZ ?MRN: [id number] ?Date of Birth: [birth year] ? ?Today's Date: 06/03/2021 ?SLP Individual Time: 5035-4656 ?SLP Individual Time Calculation (min): 44 min ? ?Short Term Goals: ?Week 1: SLP Short Term Goal 1 (Week 1): STG=LTG due to ELOS ? ?Skilled Therapeutic Interventions:Skilled ST services focused on speech skills. SLP facilitated speech strategies in conversation. Pt demonstrated awareness of all pausing/freeze moments, occurring moderately in conversation. After these episodes pt produced speech at a fast rate reducing intelligibility. SLP and pt discussed strategies to reset breath and produce speech rate at a slow and steady pace after episode. Pt demonstrated minimal episodes when speech rate was slowed down.  Pt was left in room with call bell within reach and chair alarm set. SLP recommends to continue skilled services. ? ?   ? ?Pain ?Pain Assessment ?Pain Score: 0-No pain ? ?Therapy/Group: Individual Therapy ? ?Johnnathan Hagemeister ?06/03/2021, 1:54 PM ?

## 2021-06-03 NOTE — Progress Notes (Signed)
Occupational Therapy Session Note ? ?Patient Details  ?Name: Isaiah Pena ?MRN: [id number] ?Date of Birth: 23-May-1950 ? ?Today's Date: 06/03/2021 ?OT Group Time: 0623-7628 ?OT Group Time Calculation (min): 55 min ? ? ?Short Term Goals: ?Week 1:  OT Short Term Goal 1 (Week 1): STGs=LTGs due to ELOS ?OT Short Term Goal 1 - Progress (Week 1): Progressing toward goal ?Week 2:    ? ?Skilled Therapeutic Interventions/Progress Updates:  ?  Group therapy with focus on cardiopulmonary endurance, overall strengthening/ conditioning and coordination.  ?  ?Performed: ?-w/c push ups with focus on bottom clearance 15 reps with supervision  ?-sit to stands 15 reps with supervision  ?-controlled stand to sit without UE support 15 reps with supervision ?-standing balance  with and wihtout UE support  ?-UE strengthening with 2 lb weight- bicep curls, triceps lifts, rowing and stirring in a large circular motion, batting beach ball with weighted bar horizontal. ?-Standing  and maintaining squat position  ?-Toe tapping on top of cone alternating bilaterally  ?  ?Returned to the room and helped toilet and toileting with contact guard. Left in recliner with Safety measures in place.  ? ?Therapy Documentation ?Precautions:  ?Precautions ?Precautions: Fall ?Precaution Comments: PD at baseline ?Restrictions ?Weight Bearing Restrictions: No ? ?Pain: ?Pain Assessment ?Pain Score: 0-No pain ? ? ? ?Therapy/Group: Group Therapy ? ?Nicoletta Ba ?06/03/2021, 4:31 PM ?

## 2021-06-04 LAB — BASIC METABOLIC PANEL
Anion gap: 7 (ref 5–15)
BUN: 17 mg/dL (ref 8–23)
CO2: 30 mmol/L (ref 22–32)
Calcium: 8.7 mg/dL — ABNORMAL LOW (ref 8.9–10.3)
Chloride: 107 mmol/L (ref 98–111)
Creatinine, Ser: 0.75 mg/dL (ref 0.61–1.24)
GFR, Estimated: >60 mL/min (ref >60)
Glucose, Bld: 114 mg/dL — ABNORMAL HIGH (ref 70–99)
Potassium: 4.2 mmol/L (ref 3.5–5.1)
Sodium: 144 mmol/L (ref 135–145)

## 2021-06-04 MED ORDER — POTASSIUM CHLORIDE CRYS ER 20 MEQ PO TBCR
20.0000 meq | EXTENDED_RELEASE_TABLET | Freq: Every day | ORAL | Status: DC
  Administered 2021-06-05: 20 meq via ORAL
  Filled 2021-06-04: qty 1

## 2021-06-04 NOTE — Discharge Summary (Signed)
Physical Therapy Discharge Summary ? ?Patient Details  ?Name: Isaiah Pena ?MRN: [id number] ?Date of Birth: March 23, 1950 ? ?Today's Date: 06/04/2021 ?PT Individual Time: 1300-1336 ?PT Individual Time Calculation (min): 36 min  ?Pt seen for grad day activities and wife present. Reports has not formally participated in family education and thought that might be tomorrow. Educated her that day of discharge does not have scheduled therapies so encouraged her to come during our session to provide hands on training. Performed basic transfers, stair negotiation, simulated car transfer, and gait with RW and had wife return demonstrate. Reinforcement would be beneficial for encouraging more hands on with patient when performing transfers and gait due to balance deficits and high fall risk. Pt also able to direct her as well. Encouraged her to wait for him to attempt sit > stand as he can do with CGA/supervision pending height of surface and if needed, he just needs some facilitation for anterior weightshift and stabilization for balance. Pt performed stair negotiation with L rail sideways with step to pattern to simulate home entry and wife return demonstrated with guarding of CGA level of assist. Educated on proper positioning. Car transfer with min assist for LE management needed due to decreased flexibility and restraints of this car - anticipate this may be easier in adjustable car/seat at home. Pt able to gait x 90' with CGA for balance and cues for increased step length, clearing feet, and upright posture.  ? ?Patient has met 2 of 8 long term goals due to improved activity tolerance, improved balance, improved postural control, decreased pain, and ability to compensate for deficits.  Patient to discharge at an ambulatory level [place].   Patient's care partner is independent to provide the necessary physical and cognitive assistance at discharge. ? ?Reasons goals not met: Goals set for supervision level but pt requires  inconsistent level of assist for up to CGA or min assist pending need for facilitation of anterior weightshift or due to balance. Pt also did not meet gait distance goals set of 150' as pt generally limited by endurance. Will be adequate for mobility within the household.  ? ?Recommendation:  ?Patient will benefit from ongoing skilled PT services in home health setting to continue to advance safe functional mobility, address ongoing impairments in strength, balance, endurance, activity tolerance, functional mobility, pain, and minimize fall risk. ? ?Equipment: ?No equipment provided. Pt owns RW and transport chair. ? ?Reasons for discharge: treatment goals met and discharge from hospital ? ?Patient/family agrees with progress made and goals achieved: Yes ? ?PT Discharge ?Precautions/Restrictions ?Precautions ?Precautions: Fall ?Precaution Comments: PD at baseline ?Restrictions ?Weight Bearing Restrictions: No ?Pain ?Reports pain is ok right now. Chronic back noted.  ?Pain Interference ?Pain Interference ?Pain Effect on Sleep: 2. Occasionally ?Pain Interference with Therapy Activities: 2. Occasionally ?Pain Interference with Day-to-Day Activities: 2. Occasionally ?Vision/Perception  ?Vision - History ?Ability to See in Adequate Light: 0 Adequate ?Vision - Assessment ?Convergence: Within functional limits ?Perception ?Perception: Within Functional Limits ?Praxis ?Praxis: Intact  ?Cognition ?Overall Cognitive Status: Within Functional Limits for tasks assessed ?Arousal/Alertness: Awake/alert ?Attention: Focused ?Focused Attention: Appears intact ?Memory: Appears intact ?Awareness: Appears intact ?Problem Solving: Appears intact ?Executive Function: Reasoning;Sequencing;Decision Making ?Reasoning: Appears intact ?Sequencing: Appears intact ?Decision Making: Appears intact ?Safety/Judgment: Appears intact ?Sensation ?Sensation ?Light Touch: Appears Intact ?Hot/Cold: Appears Intact ?Proprioception: Appears  Intact ?Stereognosis: Not tested ?Coordination ?Gross Motor Movements are Fluid and Coordinated: No ?Coordination and Movement Description: dysmetric movement, slight tremor BUE, delayed motoric response BUE ?Finger  Nose Finger Test: Slow movement, precise, mild tremors ?Motor  ?Motor ?Motor: Other (comment);Abnormal postural alignment and control ?Motor - Discharge Observations: slower motor output and processing due to PD  ?Mobility ?Bed Mobility ?Bed Mobility: Rolling Right;Supine to Sit;Sit to Supine ?Rolling Right: Supervision/verbal cueing ?Rolling Left: Independent with assistive device ?Right Sidelying to Sit: Contact Guard/Touching assist ?Left Sidelying to Sit: Contact Guard/Touching assist ?Supine to Sit: Minimal Assistance - Patient > 75% ?Sit to Supine: Minimal Assistance - Patient > 75% ?Transfers ?Transfers: Sit to Stand;Stand to [employer] Transfers ?Sit to Stand: Supervision/Verbal cueing;Contact Guard/Touching assist ?Stand to Sit: Supervision/Verbal cueing ?Stand Pivot Transfers: Contact Guard/Touching assist ?Stand Pivot Transfer Details: Verbal cues for gait pattern;Verbal cues for technique;Verbal cues for precautions/safety ?Stand Pivot Transfer Details (indicate cue type and reason): VC's for reminder to weightshift forward ?Transfer (Assistive device): Rolling walker ?Locomotion  ?Gait ?Ambulation: Yes ?Gait Assistance: Contact Guard/Touching assist ?Gait Distance (Feet): 90 Feet ?Assistive device: Rolling walker ?Gait ?Gait: Yes ?Gait Pattern: Impaired ?Gait Pattern: Shuffle;Trunk flexed ?Stairs / Additional Locomotion ?Stairs: Yes ?Stairs Assistance: Contact Guard/Touching assist ?Stair Management Technique: One rail Left;Step to pattern ?Number of Stairs: 6 ?Height of Stairs: 6 ?Pick up small object from the floor (from standing position) activity did not occur: Safety/medical concerns ?Wheelchair Mobility ?Wheelchair Mobility: No ?Wheelchair Assistance: Dependent - Patient  0% ?Wheelchair Propulsion:  (due to energy conservation; will use transport chair at d/c) ?Wheelchair Parts Management: Needs assistance  ?Trunk/Postural Assessment  ?Cervical Assessment ?Cervical Assessment: Exceptions to [facility] ?Cervical AROM ?Overall Cervical AROM: Deficits ?Overall Cervical AROM Comments: generalized stiffness ?Thoracic Assessment ?Thoracic Assessment: Exceptions to [facility] ?Thoracic AROM ?Overall Thoracic AROM Comments: kyphotic posture with rounded protracted scapulae Bly ?Lumbar Assessment ?Lumbar Assessment: Exceptions to [facility] ?Lumbar AROM ?Overall Lumbar AROM Comments: Posterior pelvis with generalized tighness and stiffness ?Postural Control ?Postural Control: Deficits on evaluation ?Righting Reactions: delayed/insufficient ?Protective Responses: delayed/insufficient ?Postural Limitations: forward head, kyphotic posture and posterior pelvis  ?Balance ?Balance ?Balance Assessed: Yes ?Static Sitting Balance ?Static Sitting - Level of Assistance: 5: Stand by assistance ?Dynamic Sitting Balance ?Dynamic Sitting - Level of Assistance: 5: Stand by assistance ?Static Standing Balance ?Static Standing - Level of Assistance: 5: Stand by assistance ?Dynamic Standing Balance ?Dynamic Standing - Level of Assistance: 4: Min assist ?Extremity Assessment  ?  ?  ?RLE Assessment ?RLE Assessment: Within Functional Limits ?General Strength Comments: decreased muscular endurance ?LLE Assessment ?LLE Assessment: Within Functional Limits ?General Strength Comments: decreased muscular endurance ? ? ? ?Allayne Gitelman ?Lars Masson, PT, DPT, CBIS ? ?06/04/2021, 3:43 PM ?

## 2021-06-04 NOTE — Progress Notes (Signed)
Inpatient Rehabilitation Care Coordinator ?Discharge Note  ? ?Patient Details  ?Name: Isaiah Pena ?MRN: [id number] ?Date of Birth: 1950-12-30 ? ? ?Discharge location: HOME WITH WIFE WHO HAS BEEN HERE TO PARTICIPATE IN THERAPIES WITH HUSBAND ? ?Length of Stay: 11 DAYS ? ?Discharge activity level: SUPERVISION WITH CUES ? ?Home/community participation: ACTIVE ? ?Patient response FH:LKTGYB Literacy - How often do you need to have someone help you when you read instructions, pamphlets, or other written material from your doctor or pharmacy?: Never ? ?Patient response WL:SLHTDS Isolation - How often do you feel lonely or isolated from those around you?: Never ? ?Services provided included: MD, RD, PT, OT, SLP, RN, CM, Pharmacy, SW ? ?Financial Services:  ?Charity fundraiser Utilized: Medicare ?  ? ?Choices offered to/list presented to: PT AND WIFE ? ?Follow-up services arranged:  ?Home Health, Patient/Family request agency HH/DME ?Home Health Agency: [facility]-PT OT, RN, SP  ?HAS ALL DME WILL GET TRANSPORT CHAIR ON OWN HAS PRESCRIPTION ?  ?  ?HH/DME Requested Agency: [facility] ? ?Patient response to transportation need: ?Is the patient able to respond to transportation needs?: Yes ?In the past 12 months, has lack of transportation kept you from medical appointments or from getting medications?: No ?In the past 12 months, has lack of transportation kept you from meetings, work, or from getting things needed for daily living?: No ? ? ? ?Comments (or additional information):WIFE WAS HERE DAILY AND PARTICIPATED IN HUSBAND'S CARE. WILL GET BARIATRIC TRANSPORT CHAIR ON OWN.  ? ?Patient/Family verbalized understanding of follow-up arrangements:  Yes ? ?Individual responsible for coordination of the follow-up plan: KRISTY-WIFE [phone] ? ?Confirmed correct DME delivered: Elease Hashimoto 06/04/2021   ? ?Elease Hashimoto ?

## 2021-06-04 NOTE — Progress Notes (Signed)
Patient ID: Isaiah Pena, male   DOB: [birth year], 71 y.o.   MRN: [id number]  Met with pt and wife to discuss team conference meeting goals for discharge tomorrow. Wife to get transport chair on own due to needs bariatric one. Home health set up via [place]. Pt feels prepared for discharge tomorrow. ?

## 2021-06-04 NOTE — Progress Notes (Addendum)
?                                                       PROGRESS NOTE ? ? ?Subjective/Complaints: ? ?No new complaints or concerns. Reports he feels well overall.  Planning to go home tomorrow.  ? ?ROS:  ? ?Pt denies fever, chills SOB, abd pain, CP, N/V/C/D, and vision changes.no HA ? ?Objective: ?  ?No results found. ?No results for input(s): WBC, HGB, HCT, PLT in the last 72 hours. ? ?Recent Labs  ?  06/03/21 ?0807 06/04/21 ?7829  ?NA 139 144  ?K 3.2* 4.2  ?CL 104 107  ?CO2 28 30  ?GLUCOSE 122* 114*  ?BUN 16 17  ?CREATININE 0.85 0.75  ?CALCIUM 8.6* 8.7*  ? ? ? ?Intake/Output Summary (Last 24 hours) at 06/04/2021 0739 ?Last data filed at 06/03/2021 1800 ?Gross per 24 hour  ?Intake 480 ml  ?Output 1000 ml  ?Net -520 ml  ? ?  ? ?  ? ?Physical Exam: ?Vital Signs ?Blood pressure (!) 153/98, pulse 84, temperature 97.7 ?F (36.5 ?C), resp. rate 20, height '5\' 9"'$  (1.753 m), weight 115.3 kg, SpO2 99 %. ? ? ? ? ? ? ?General: No acute distress ?Mood and affect are appropriate ?Heart: Regular rate and rhythm no rubs murmurs or extra sounds ?Lungs: Clear to auscultation, breathing unlabored, no rales or wheezes ?Abdomen: Positive bowel sounds, soft nontender to palpation, nondistended ?Extremities: No clubbing, cyanosis, or edema ? ? ?GU- foley in place- attached correctly to leg ?Extremities: No clubbing, cyanosis, or edema. Pulses are 2+ ?Psych: Pt's affect is appropriate. Pt is cooperative ?Skin: Clean and intact without signs of breakdown, chronic vasc changes in LE's ?Neuro:  Alert and oriented x 3. Normal insight and awareness. Intact Memory. Normal language and speech. Cranial nerve exam unremarkable. Masked facies. Mild cogwheeling in UE and Le's. Motor 4-5/5 in UE and 3-4/5 in LE's. No focal sensory findings ?Musculoskeletal: mild pain with ROM of hips and shoulder.  ? ? ?Assessment/Plan: ?1. Functional deficits which require 3+ hours per day of interdisciplinary therapy in a comprehensive inpatient rehab  setting. ?Physiatrist is providing close team supervision and 24 hour management of active medical problems listed below. ?Physiatrist and rehab team continue to assess barriers to discharge/monitor patient progress toward functional and medical goals ? ?Care Tool: ? ?Bathing ?   ?Body parts bathed by patient: Right arm, Left arm, Chest, Abdomen, Face, Front perineal area, Right upper leg, Left upper leg, Left lower leg, Right lower leg, Buttocks  ? Body parts bathed by helper: Right lower leg, Left lower leg, Buttocks ?  ?  ?Bathing assist Assist Level: Moderate Assistance - Patient 50 - 74% ?  ?  ?Upper Body Dressing/Undressing ?Upper body dressing   ?What is the patient wearing?: Pull over shirt ?   ?Upper body assist Assist Level: Set up assist ?   ?Lower Body Dressing/Undressing ?Lower body dressing ? ? ?   ?What is the patient wearing?: Incontinence brief, Pants ? ?  ? ?Lower body assist Assist for lower body dressing: Minimal Assistance - Patient > 75% ?   ? ?Toileting ?Toileting    ?Toileting assist Assist for toileting: Minimal Assistance - Patient > 75% ?  ?  ?Transfers ?Chair/bed transfer ? ?Transfers assist ?   ? ?Chair/bed transfer assist level: Minimal  Assistance - Patient > 75% ?  ?  ?Locomotion ?Ambulation ? ? ?Ambulation assist ? ?   ? ?Assist level: Minimal Assistance - Patient > 75% ?Assistive device: Walker-rolling ?Max distance: 90  ? ?Walk 10 feet activity ? ? ?Assist ?   ? ?Assist level: Minimal Assistance - Patient > 75% ?Assistive device: Walker-rolling  ? ?Walk 50 feet activity ? ? ?Assist Walk 50 feet with 2 turns activity did not occur: Safety/medical concerns ? ?Assist level: Minimal Assistance - Patient > 75% ?Assistive device: Walker-rolling  ? ? ?Walk 150 feet activity ? ? ?Assist Walk 150 feet activity did not occur: Safety/medical concerns ? ?  ?  ?  ? ?Walk 10 feet on uneven surface  ?activity ? ? ?Assist Walk 10 feet on uneven surfaces activity did not occur: Safety/medical  concerns ? ? ?Assist level: Minimal Assistance - Patient > 75% ?Assistive device: Walker-rolling  ? ?Wheelchair ? ? ? ? ?Assist Is the patient using a wheelchair?: Yes ?Type of Wheelchair: Manual ?  ? ?Wheelchair assist level: Dependent - Patient 0% ?Max wheelchair distance: 150'  ? ? ?Wheelchair 50 feet with 2 turns activity ? ? ? ?Assist ? ?  ?  ? ? ?Assist Level: Dependent - Patient 0%  ? ?Wheelchair 150 feet activity  ? ? ? ?Assist ?   ? ? ?Assist Level: Dependent - Patient 0%  ? ?Blood pressure (!) 153/98, pulse 84, temperature 97.7 ?F (36.5 ?C), resp. rate 20, height '5\' 9"'$  (1.753 m), weight 115.3 kg, SpO2 99 %. ? ?Medical Problem List and Plan: ?1. Functional deficits secondary to pseudo-exacerbation of parkinson's disease/disability ?            -patient may shower ?            -ELOS/Goals: 7-10 days supervision to min assist goals ? -Home tomorrow ? Continue CIR- PT, OT and SLP ? -Walked 68f with RW ?2.  Antithrombotics: ?-DVT/anticoagulation:  Pharmaceutical: Xarelto ?            -antiplatelet therapy: n/a ?3. Pain Management: tramadol and tylenol prn. Appears fairly well managed at present ?            -monitor activity tolerance ? 5/9- will add lidoderm patches 8pm to 8am- advised pt to have nursing remove in AM ? 5/10- has helped some- con't regimen ? 5/11- needs to have patches changed daily.  ?4. Mood: team to provide ego support ?            -klonopin prn for anxiety ?            -antipsychotic agents: n/a ?5. Neuropsych: This patient is capable of making decisions on his own behalf. ?6. Skin/Wound Care: local skin, foley care ?7. Fluids/Electrolytes/Nutrition: encourage appropriate nutrition ?            5/15 Hypo K+ increase KCL to BID, recheck in am .  hypomagnesia-resolved now in 1.7-1.8 range ? 5/16 K+ 4.2 today, improved, KCL to daily, follow ?8. Parkinson's Disease: ?            -amantadine, sinemet, comtan per baseline regimen ? -seems fairly well compensated ?9. AKI/Urine retention/BPH: ?             -Foley in place and will [place] [place] 2 [place] ?            -finasteride and fesoterodine per urology ? -5/12- Urology wants foley to remain in place until d/c.  To  f/u with Urology after d/c.  ? -5/13 Wife reports urology f/u is scheduled after D/C, continue current plan ? -5/16 Cr stable at 0.75, continue foley ?10. HTN: 5/7 remains quite elevate ?            -continue norvasc, lopressor  ?            -add in benazepril which he used in combo with amlodipine pta  ?  ?Vitals:  ? 06/04/21 0744 06/04/21 1354  ?BP: 133/89 107/86  ?Pulse: 90 84  ?Resp:  15  ?Temp:  97.9 ?F (36.6 ?C)  ?SpO2:  97%  ?Well controlled today, follow ? ?11. Slow transit constipation: ?            -scheduled miralax and senna-s ?            -prn's ordered ?12. Hx of CAD/persistent A -fib --lopressor, lipitor, xarelto ?       BP/HR controled  ?13. OSA: not currently using bipap ?14. Morbid obesity   ?15. Right Renal mass : c/w cystic lesion on MRI ? 16. Grief- ?06/01/22- Good friend died suddenly- will monitor closely- d/w nursing to keep an eye on him.  ?17. Constipation ? Improved 5/15 ? ? ?LOS: ?10 days ?A FACE TO FACE EVALUATION WAS PERFORMED ? ?Jennye Boroughs ?06/04/2021, 7:39 AM  ? ?  ?

## 2021-06-04 NOTE — Progress Notes (Signed)
Occupational Therapy Discharge Summary ? ?Patient Details  ?Name: Isaiah Pena ?MRN: [id number] ?Date of Birth: 06-01-1950 ? ?Today's Date: 06/04/2021 ?OT Individual Time: 0800-0900 ?OT Individual Time Calculation (min): 60 min  ? ? ?Patient has met 6 of 6 long term goals due to improved activity tolerance, improved balance, postural control, ability to compensate for deficits, and improved coordination.  Patient to discharge at [facility] [address] Assist level.  Patient's care partner is independent to provide the necessary physical assistance at discharge.   ? ?Reasons goals not met: n/a- all goals met  ? ?Recommendation:  ?Patient will benefit from ongoing skilled OT services in outpatient setting to continue to advance functional skills in the area of BADL and iADL. ? ?Equipment: ?Transport w/c ? ?Reasons for discharge: treatment goals met ? ?Patient/family agrees with progress made and goals achieved: Yes ? ?OT Discharge ?Precautions/Restrictions  ?Precautions ?Precautions: Fall ?Precaution Comments: PD at baseline ?Restrictions ?Weight Bearing Restrictions: No ?General ?  ?Vital Signs ?Therapy Vitals ?Temp: 97.9 ?F (36.6 ?C) ?Temp Source: Oral ?Pulse Rate: 84 ?Resp: 15 ?BP: 107/86 ?Patient Position (if appropriate): Lying ?Oxygen Therapy ?SpO2: 97 % ?O2 Device: Room Air ?Pain ?Pain Assessment ?Pain Scale: 0-10 ?Pain Score: 4  ?Pain Type: Chronic pain ?Pain Location: Back ?Pain Orientation: Mid ?Pain Descriptors / Indicators: Aching ?Pain Onset: With Activity ?Patients Stated Pain Goal: 0 ?Pain Intervention(s): Medication (See eMAR);Repositioned;Ambulation/increased activity;Relaxation;Shower ?Multiple Pain Sites: No ?ADL ?ADL ?Equipment Provided: Reacher, Long-handled shoe horn ?Eating: Independent ?Where Assessed-Eating: Chair ?Grooming: Independent ?Where Assessed-Grooming: Sitting at sink ?Upper Body Bathing: Setup ?Where Assessed-Upper Body Bathing: Shower ?Lower Body Bathing: Minimal assistance ?Where  Assessed-Lower Body Bathing: Shower ?Upper Body Dressing: Independent ?Where Assessed-Upper Body Dressing: Sitting at sink ?Lower Body Dressing: Minimal assistance ?Where Assessed-Lower Body Dressing: Sitting at sink, Standing at sink ?Toileting: Minimal assistance ?Where Assessed-Toileting: Toilet ?Toilet Transfer: Contact guard ?Toilet Transfer Method: Stand pivot, Ambulating ?Science writer: Grab bars ?Tub/Shower Transfer: Contact guard ?Walk-In Shower Transfer: Contact guard ?Walk-In Shower Transfer Method: Stand pivot, Ambulating ?Walk-In Shower Equipment: Civil engineer, contracting with back, Grab bars ?ADL Comments: overall relatively Indep for all UB self care including self feeding, grooming and dressing pull over shirt. LB bathing and dressing min A due to foley management, using reacher and [place] for LB garments and shoes ?Vision ?Baseline Vision/History: 0 No visual deficits ?Patient Visual Report: No change from baseline ?Vision Assessment?: No apparent visual deficits ?Convergence: Within functional limits ?Perception  ?Perception: Within Functional Limits ?Praxis ?Praxis: Intact ?Cognition ?Cognition ?Overall Cognitive Status: Within Functional Limits for tasks assessed ?Arousal/Alertness: Awake/alert ?Orientation Level: Person;Place;Situation ?Person: Oriented ?Place: Oriented ?Situation: Oriented ?Memory: Appears intact ?Attention: Focused ?Focused Attention: Appears intact ?Awareness: Appears intact ?Problem Solving: Appears intact ?Executive Function: Reasoning;Sequencing;Decision Making ?Reasoning: Appears intact ?Sequencing: Appears intact ?Decision Making: Appears intact ?Safety/Judgment: Appears intact ?Brief Interview for Mental Status (BIMS) ?Repetition of Three Words (First Attempt): 3 ?Temporal Orientation: Year: Correct ?Temporal Orientation: Month: Accurate within 5 days ?Temporal Orientation: Day: Correct ?Recall: "Sock": Yes, no cue required ?Recall: "Blue": Yes, no cue required ?Recall:  "Bed": Yes, no cue required ?BIMS Summary Score: 15 ?Sensation ?Sensation ?Light Touch: Appears Intact ?Hot/Cold: Appears Intact ?Proprioception: Appears Intact ?Stereognosis: Not tested ?Coordination ?Gross Motor Movements are Fluid and Coordinated: No ?Coordination and Movement Description: dysmetric movement, slight tremor BUE, delayed motoric response BUE ?Finger Nose Finger Test: Slow movement, precise, mild tremors ?Motor  ?Motor ?Motor: Other (comment);Abnormal postural alignment and control ?Motor - Discharge Observations: slower motor output and processing due to PD ?  Mobility  ?Bed Mobility ?Bed Mobility: Rolling Right;Supine to Sit;Sit to Supine ?Rolling Right: Supervision/verbal cueing ?Rolling Left: Independent with assistive device ?Right Sidelying to Sit: Contact Guard/Touching assist ?Left Sidelying to Sit: Contact Guard/Touching assist ?Supine to Sit: Minimal Assistance - Patient > 75% ?Sit to Supine: Minimal Assistance - Patient > 75% ?Transfers ?Sit to Stand: Supervision/Verbal cueing;Contact Guard/Touching assist ?Stand to Sit: Supervision/Verbal cueing  ?Trunk/Postural Assessment  ?Cervical Assessment ?Cervical Assessment: Exceptions to [facility] ?Cervical AROM ?Overall Cervical AROM: Deficits ?Overall Cervical AROM Comments: generalized stiffness ?Thoracic Assessment ?Thoracic Assessment: Exceptions to [facility] ?Thoracic AROM ?Overall Thoracic AROM Comments: kyphotic posture with rounded protracted scapulae Bly ?Lumbar Assessment ?Lumbar Assessment: Exceptions to [facility] ?Lumbar AROM ?Overall Lumbar AROM Comments: Posterior pelvis with generalized tighness and stiffness ?Postural Control ?Postural Control: Deficits on evaluation ?Righting Reactions: delayed/insufficient ?Protective Responses: delayed/insufficient ?Postural Limitations: forward head, kyphotic posture and posterior pelvis  ?Balance ?Balance ?Balance Assessed: Yes ?Static Sitting Balance ?Static Sitting - Balance Support: No upper extremity  supported;Feet supported ?Static Sitting - Level of Assistance: 7: Independent ?Dynamic Sitting Balance ?Dynamic Sitting - Balance Support: No upper extremity supported;Feet supported;During functional activity ?Dynamic Sitting - Level of Assistance: 5: Stand by assistance ?Sitting balance - Comments: During ADL's ?Static Standing Balance ?Static Standing - Balance Support: Bilateral upper extremity supported;During functional activity ?Static Standing - Level of Assistance: 4: Min assist ?Dynamic Standing Balance ?Dynamic Standing - Balance Support: Bilateral upper extremity supported;During functional activity ?Dynamic Standing - Level of Assistance: 4: Min assist ?Dynamic Standing - Balance Activities: Reaching for objects ?Dynamic Standing - Comments: ADL reaching activity within 2-3" of BOS ?Extremity/Trunk Assessment ?RUE Assessment ?RUE Assessment: Within Functional Limits ?General Strength Comments: 4/5 ?LUE Assessment ?LUE Assessment: Within Functional Limits ?General Strength Comments: 4/5 ? ?OT completed all training and discharge education during full AM self care, mobility and UE HEP session. Pt has met all goals set and has improved with mobility with improved but continued slower motor output and processing due to underlying premorbid condition of PD. Pt requires min A for Foley management during LB bathing and dressing shower seat level with intermittent standing with grab bar. Using reacher and [facility] for LB garments and slip on shoes. UB bathing and dressing set up assist only. Pt now ambulating with RW to access toilet and shower bench in walk in shower with grab bars and CGA. Wife to obtain transport w/c for discharge and pt has all other bathroom DME in home from previous. OT reinforced use of red (medium) theraband for scap retraction, sh flexion/ext, elbow flexion/extension 10 reps each set for HEP carryover. No further OT needs at this time with all goals met and education completed.  ? ?Barnabas Lister ?06/04/2021, 3:44 PM ?

## 2021-06-04 NOTE — Plan of Care (Signed)
?  Problem: RH Expression Communication ?Goal: LTG Patient will increase speech intelligibility (SLP) ?Description: LTG: Patient will increase speech intelligibility at word/phrase/conversation level with cues, % of the time (SLP) ?Outcome: Completed/Met ?Flowsheets (Taken 06/04/2021 1121) ?LTG: Patient will increase speech intelligibility (SLP): Supervision ?Level: Phrase ?Percent of time patient will use intelligible speech: 90-95 ?  ?

## 2021-06-04 NOTE — Progress Notes (Signed)
Speech Language Pathology Discharge Summary ? ?Patient Details  ?Name: Isaiah Pena ?MRN: [id number] ?Date of Birth: 10/09/1950 ? ?Today's Date: 06/04/2021 ?SLP Individual Time: 0901-1000 ?SLP Individual Time Calculation (min): 59 min ? ?Skilled Therapeutic Interventions:  Pt seen for skilled ST with focus on speech goals, pt upright in wheelchair and agreeable to therapeutic tasks. Pt reports less frequent "drop offs" during speech tasks citing improvement in breath support, 1 episode of freezing/pausing during 60 minute session which patient was able to ID and self-correct with strategies. SLP facilitating functional speech tasks with overall Mod I cues this date, encouraged to f/u with OP ST as desired for Speak Out/LSVT programs. Pt with no further questions at this time, thankful for ST intervention and motivated to return home. ? ?Patient has met 1 of 1 long term goals.  Patient to discharge at overall Supervision level.  ? ?Clinical Impression/Discharge Summary:  Pt has made excellent progress during CIR stay meeting 1 out of 1 long term goals. Pt is currently utilizing speech intelligibility strategies with Supervision A cues with 90-95% intelligibility at sentence level. Pt demonstrates teach back of education for expressive language strategies, pt wife has been present during stay and educated as well. Consider outpatient ST services for LSVT and Speak Out if pt/wife desire ongoing services. ? ?Care Partner:  ?Caregiver Able to Provide Assistance: Yes  ?Type of Caregiver Assistance: Physical;Cognitive ? ?Recommendation:  ?Other (comment);Outpatient SLP (consideration of Speak Out or LSVT programs on OP basis)  ?Rationale for SLP Follow Up: Maximize functional communication  ? ?Reasons for discharge: Treatment goals met;Discharged from hospital  ? ?Patient/Family Agrees with Progress Made and Goals Achieved: Yes  ? ? ?Jenna R Stewart ?06/04/2021, 10:00 AM ? ?

## 2021-06-04 NOTE — Patient Care Conference (Addendum)
Inpatient RehabilitationTeam Conference and Plan of Care Update Date: 06/04/2021   Time: 11:39 AM    Patient Name: Isaiah Pena      Medical Record Number: [id number]  Date of Birth: 1950/12/19 Sex: Male         Room/Bed: 4W10C/4W10C-01 Payor Info: Payor: MEDICARE / Plan: MEDICARE PART A AND B / Product Type: *No Product type* /    Admit Date/Time:  05/25/2021  3:18 PM  Primary Diagnosis:  [facility] Problems: Principal Problem:   Debility    Expected Discharge Date: Expected Discharge Date: 06/05/21  Team Members Present: Physician leading conference: Dr. Jennye Boroughs Social Worker Present: Ovidio Kin, LCSW Nurse Present: Dorthula Nettles, RN PT Present: Leavy Cella, PT OT Present: Jamey Ripa, OT SLP Present: Lillie Columbia, SLP PPS Coordinator present : Gunnar Fusi, SLP     Current Status/Progress Goal Weekly Team Focus  Bowel/Bladder   cont. of bowel, chronic foley LBM 5/15  remain cont of bowel  assess q shift and prn foley care q shift and prn   Swallow/Nutrition/ Hydration             ADL's   UB bathing and dressing sink side with set up only, UB bathing shower level with S, LB bathing shwoer bench level and dressing min A with AE, grooming set up sink side seated level, toilet transfer with grab bars using RW for access with S  Goals met- UB self care Set up, LB bathing and dressing min A with AE, grooming set up, tilet transfers S  d/c training, self care and functioanl mobility training, AE training   Mobility   90 ft gait with rw andCGA, up/down 4 steps CGA.  supervision gait and transfers, min A stairs  gait, stairs, education, safety, d/c planning, transfers.   Communication   sup A  sup A  Speech/breath support strategies at the sentence level   Safety/Cognition/ Behavioral Observations            Pain   no current complaints of pain  pain <3  assess q shift and prn   Skin   Masd to groin and sacrum  no new skin breakdown  assess q shift and  prn     Discharge Planning:  Wife here daily and participated in therapies with pt. Aware of his care needs. Will get transport chair on own due to needs bariatric one   Team Discussion: No complaints. Medically ready for discharge. Will have Urology follow-up. Continue foley, continent bowel. Reports 6/10 pain. Spouse to get bariatric transport chair.   Patient on target to meet rehab goals: yes, supervision goals. Currently set-up to min assist foley management. CGA to supervision ambulation. Walked 90 ft, 2 steps at [facility]. SLP reports patient doing well. Will follow-up with Speak Out program for Parkinson's patients.  *See Care Plan and progress notes for long and short-term goals.   Revisions to Treatment Plan:  Finalizing discharge plans.   Teaching Needs: Family education complete.   Current Barriers to Discharge: No current barriers  Possible Resolutions to Barriers: All barriers addressed     Medical Summary Current Status: parkinsons, AKI, urinary retension,HTN,constipation  Barriers to Discharge: Medical stability;Home enviroment access/layout  Barriers to Discharge Comments: parkinsons, AKI, urinary retension,HTN,constipation Possible Resolutions to [employer] Focus: follow labs, FOley care with urology f/u   Continued Need for Acute Rehabilitation Level of Care: The patient requires daily medical management by a physician with specialized training in physical medicine and rehabilitation for  the following reasons: Direction of a multidisciplinary physical rehabilitation program to maximize functional independence : Yes Medical management of patient stability for increased activity during participation in an intensive rehabilitation regime.: Yes Analysis of laboratory values and/or radiology reports with any subsequent need for medication adjustment and/or medical intervention. : Yes   I attest that I was present, lead the team conference, and concur with the  assessment and plan of the team.   Cristi Loron 06/04/2021, 4:34 PM

## 2021-06-04 NOTE — Progress Notes (Signed)
Inpatient Rehabilitation Discharge Medication Review by a Pharmacist ? ?A complete drug regimen review was completed for this patient to identify any potential clinically significant medication issues. ? ?High Risk Drug Classes Is patient taking? Indication by Medication  ?Antipsychotic No   ?Anticoagulant Yes Xarelto - atrial fibrillation  ?Antibiotic No   ?Opioid Yes Tramadol prn - pain  ?Antiplatelet No   ?Hypoglycemics/insulin No   ?Vasoactive Medication Yes Amlodipine, benazepril, Metoprolol - hypertension, rate control  ?Chemotherapy No   ?Other Yes Amantadine, Sinemet IR, Comtan - Parkinson's ?Atorvastatin, Zetia - HLD ?Detrol LA, Tadalafil, Finasteride - BPH ?Protonix - GERD ?Miralax, Senokot S - laxatives ?Clonazepam prn - anxiety  ? ? ? ?Type of Medication Issue Identified Description of Issue Recommendation(s)  ?Drug Interaction(s) (clinically significant) ?    ?Duplicate Therapy ?    ?Allergy ?    ?No Medication Administration End Date ?    ?Incorrect Dose ?    ?Additional Drug Therapy Needed ?    ?Significant med changes from prior encounter (inform family/care partners about these prior to discharge).    ?Other ?    ? ? ?Clinically significant medication issues were identified that warrant physician communication and completion of prescribed/recommended actions by midnight of the next day:  No ? ? ?Time spent performing this drug regimen review (minutes):  30 ? ?Vita Currin BS, PharmD, BCPS ?Clinical Pharmacist ?06/05/2021 8:30 AM ? ?Contact: [phone] after 3 PM ? ?"Be curious, not judgmental..." -Jamal Maes ?

## 2021-06-04 NOTE — Discharge Instructions (Addendum)
Inpatient Rehab Discharge Instructions ? ?[name] ?Discharge date and time: 06/05/2021 ? ?Activities/Precautions/ Functional Status: ?Activity: no lifting, driving, or strenuous exercise until cleared by MD ?Diet: regular diet ?Wound Care: none needed ? ?Functional status:  ?___ No restrictions     ___ Walk up steps independently ?___ 24/7 supervision/assistance   ___ Walk up steps with assistance ?_x__ Intermittent supervision/assistance  ___ Bathe/dress independently ?___ Walk with walker     __x_ Bathe/dress with assistance ?___ Walk Independently    ___ Shower independently ?___ Walk with assistance    ___ Shower with assistance ?_x__ No alcohol     ___ Return to work/school ________ ? ?Special Instructions: ? ?No driving, alcohol consumption or tobacco use.  ? ? ?COMMUNITY REFERRALS UPON DISCHARGE:   ? ?Home Health:   PT  OT  SP RN  ?               [place]  ? ? ?Medical Equipment/Items Ordered:WIFE TO GET BARIATRIC TRANSPORT CHAIR ON OWN HAS PRESCRIPTION FOR ?                                                 ? ?My questions have been answered and I understand these instructions. I will adhere to these goals and the provided educational materials after my discharge from the hospital. ? ?Patient/Caregiver Signature _______________________________ Date __________ ? ?Clinician Signature _______________________________________ Date __________ ? ?Please bring this form and your medication list with you to all your follow-up doctor's appointments.   ?

## 2021-06-04 NOTE — Progress Notes (Signed)
Occupational Therapy Session Note ? ?Patient Details  ?Name: Isaiah Pena ?MRN: [id number] ?Date of Birth: 06-Mar-1950 ? ?Today's Date: 06/04/2021 ?OT Individual Time: 5465-0354 ?OT Individual Time Calculation (min): 40 min  ? ? ?Short Term Goals: ?Week 2:  STG=LTG D/C 06/05/21 ? ?Skilled Therapeutic Interventions/Progress Updates:  ?  Patient received sitting up in recliner, awake and alert.  Patient excited for discharge tomorrow and feels he has made good progress, and is aware that he will receive [facility] services upon discharge.  Patient assisted from recliner with cueing for forward lean, then walked to bathroom for toileting.  Patient required assistance for door management to walk into bathroom - judge distance with walker, etc.  Patient with continent void on toilet - needing assistance for hygiene - reports having a bidet at home.  Patient ambulated back to recliner.   ?Completed aspects of discharge this session.   ?Patient's wife arrived at end of session.   ? ?Therapy Documentation ?Precautions:  ?Precautions ?Precautions: Fall ?Precaution Comments: PD at baseline ?Restrictions ?Weight Bearing Restrictions: No ?  ?Pain: ?Pain Assessment ?Pain Scale: 0-10 ?Pain Score: 0-No pain ? ? ? ?Therapy/Group: Individual Therapy ? ?Mariah Milling ?06/04/2021, 12:28 PM ?

## 2021-06-04 NOTE — Plan of Care (Signed)
?  Problem: RH Eating °Goal: LTG Patient will perform eating w/assist, cues/equip (OT) °Description: LTG: Patient will perform eating with assist, with/without cues using equipment (OT) °Outcome: Completed/Met °  °Problem: RH Grooming °Goal: LTG Patient will perform grooming w/assist,cues/equip (OT) °Description: LTG: Patient will perform grooming with assist, with/without cues using equipment (OT) °Outcome: Completed/Met °  °Problem: RH Bathing °Goal: LTG Patient will bathe all body parts with assist levels (OT) °Description: LTG: Patient will bathe all body parts with assist levels (OT) °Outcome: Completed/Met °  °Problem: RH Dressing °Goal: LTG Patient will perform upper body dressing (OT) °Description: LTG Patient will perform upper body dressing with assist, with/without cues (OT). °Outcome: Completed/Met °Goal: LTG Patient will perform lower body dressing w/assist (OT) °Description: LTG: Patient will perform lower body dressing with assist, with/without cues in positioning using equipment (OT) °Outcome: Completed/Met °  °Problem: RH Toileting °Goal: LTG Patient will perform toileting task (3/3 steps) with assistance level (OT) °Description: LTG: Patient will perform toileting task (3/3 steps) with assistance level (OT)  °Outcome: Completed/Met °  °Problem: RH Toilet Transfers °Goal: LTG Patient will perform toilet transfers w/assist (OT) °Description: LTG: Patient will perform toilet transfers with assist, with/without cues using equipment (OT) °Outcome: Completed/Met °  °Problem: RH Tub/Shower Transfers °Goal: LTG Patient will perform tub/shower transfers w/assist (OT) °Description: LTG: Patient will perform tub/shower transfers with assist, with/without cues using equipment (OT) °Outcome: Completed/Met °  °

## 2021-06-04 NOTE — Progress Notes (Signed)
Patient ID: Isaiah Pena, male   DOB: 1950-02-20, 71 y.o.   MRN: [id number] ? ? ? ? ?Diagnosis codes:G20 and R53.1 ? ?Height:    5'8           ? ?Weight:  253 lbs     ? ?   ? ?Patient suffers from  Parkinson's  disease which impairs his ability to perform daily activities like  ADL's   in the home.  A rolling walker  will not resolve the issue with performing activities of daily living.  A transport chair will allow patient to safely perform daily activities. The patient has a caregiver-his wife to propel him in the transport chair. Length of need is 99 months ?

## 2021-06-05 ENCOUNTER — Other Ambulatory Visit: Payer: Self-pay | Admitting: Cardiology

## 2021-06-05 ENCOUNTER — Other Ambulatory Visit: Payer: Self-pay | Admitting: Physician Assistant

## 2021-06-05 DIAGNOSIS — I4819 Other persistent atrial fibrillation: Secondary | ICD-10-CM

## 2021-06-05 DIAGNOSIS — E669 Obesity, unspecified: Secondary | ICD-10-CM

## 2021-06-05 MED ORDER — POTASSIUM CHLORIDE CRYS ER 20 MEQ PO TBCR: 20.0000 meq | EXTENDED_RELEASE_TABLET | ORAL | 0 refills | Status: DC

## 2021-06-05 MED ORDER — ACETAMINOPHEN ER 650 MG PO TBCR: 1300.0000 mg | EXTENDED_RELEASE_TABLET | Freq: Three times a day (TID) | ORAL | Status: DC | PRN

## 2021-06-05 MED ORDER — MAGNESIUM GLUCONATE 500 MG PO TABS: 500.0000 mg | ORAL_TABLET | Freq: Two times a day (BID) | ORAL | 0 refills | Status: DC

## 2021-06-05 MED ORDER — FINASTERIDE 5 MG PO TABS: 5.0000 mg | ORAL_TABLET | Freq: Every day | ORAL | 0 refills | Status: DC

## 2021-06-05 NOTE — Telephone Encounter (Signed)
Patient newly prescribed this medication during acute care stay. Is receiving both Proscar and Detrol. ?

## 2021-06-05 NOTE — Progress Notes (Signed)
?                                                       PROGRESS NOTE ? ? ?Subjective/Complaints: ? ?Ready to go home today. No new concerns.  ? ?ROS:  ? ?Pt denies fever, chills SOB, abd pain, CP, N/V/C/D. No new pain.  ? ?Objective: ?  ?No results found. ?No results for input(s): WBC, HGB, HCT, PLT in the last 72 hours. ? ?Recent Labs  ?  06/03/21 ?0807 06/04/21 ?7353  ?NA 139 144  ?K 3.2* 4.2  ?CL 104 107  ?CO2 28 30  ?GLUCOSE 122* 114*  ?BUN 16 17  ?CREATININE 0.85 0.75  ?CALCIUM 8.6* 8.7*  ? ? ? ?Intake/Output Summary (Last 24 hours) at 06/05/2021 0743 ?Last data filed at 06/05/2021 0433 ?Gross per 24 hour  ?Intake 600 ml  ?Output 1500 ml  ?Net -900 ml  ? ?  ? ?  ? ?Physical Exam: ?Vital Signs ?Blood pressure (!) 148/88, pulse 84, temperature 97.8 ?F (36.6 ?C), temperature source Oral, resp. rate 14, height '5\' 9"'$  (1.753 m), weight 115.3 kg, SpO2 96 %. ? ? ? ? ? ? ?General: No acute distress, sitting in chair ?Mood and affect are appropriate ?Heart: Regular rate and rhythm no rubs murmurs or extra sounds ?Lungs: CTAB, normal effort ?Abdomen: Positive bowel sounds, soft nontender to palpation, nondistended ?Extremities: No clubbing, cyanosis, or edema ?GU- foley in place ?Extremities: No clubbing, cyanosis, or edema.  ?Psych: Pt's affect is appropriate. Pt is cooperative ?Skin: Clean and intact without signs of breakdown, chronic vasc changes in LE's ?Neuro:  Alert and oriented x 3. Normal insight and awareness. Intact Memory. Normal language and speech. Cranial nerve exam unremarkable. Masked facies. Mild cogwheeling in UE and Le's. Motor 4-5/5 in UE and 3-4/5 in LE's. No focal sensory findings ?Musculoskeletal: mild pain with ROM of hips and shoulder.  ? ? ?Assessment/Plan: ?1. Functional deficits which require 3+ hours per day of interdisciplinary therapy in a comprehensive inpatient rehab setting. ?Physiatrist is providing close team supervision and 24 hour management of active medical problems listed  below. ?Physiatrist and rehab team continue to assess barriers to discharge/monitor patient progress toward functional and medical goals ? ?Care Tool: ? ?Bathing ?   ?Body parts bathed by patient: Right arm, Left arm, Chest, Abdomen, Face, Front perineal area, Right upper leg, Left upper leg, Left lower leg, Right lower leg, Buttocks  ? Body parts bathed by helper: Right lower leg, Left lower leg, Buttocks ?  ?  ?Bathing assist Assist Level: Minimal Assistance - Patient > 75% ?  ?  ?Upper Body Dressing/Undressing ?Upper body dressing   ?What is the patient wearing?: Pull over shirt ?   ?Upper body assist Assist Level: Independent ?   ?Lower Body Dressing/Undressing ?Lower body dressing ? ? ?   ?What is the patient wearing?: Incontinence brief, Pants ? ?  ? ?Lower body assist Assist for lower body dressing: Minimal Assistance - Patient > 75% ?   ? ?Toileting ?Toileting    ?Toileting assist Assist for toileting: Minimal Assistance - Patient > 75% ?  ?  ?Transfers ?Chair/bed transfer ? ?Transfers assist ?   ? ?Chair/bed transfer assist level: Contact Guard/Touching assist ?  ?  ?Locomotion ?Ambulation ? ? ?Ambulation assist ? ?   ? ?Assist level:  Contact Guard/Touching assist ?Assistive device: Walker-rolling ?Max distance: 72'  ? ?Walk 10 feet activity ? ? ?Assist ?   ? ?Assist level: Contact Guard/Touching assist ?Assistive device: Walker-rolling  ? ?Walk 50 feet activity ? ? ?Assist Walk 50 feet with 2 turns activity did not occur: Safety/medical concerns ? ?Assist level: Contact Guard/Touching assist ?Assistive device: Walker-rolling  ? ? ?Walk 150 feet activity ? ? ?Assist Walk 150 feet activity did not occur: Safety/medical concerns ? ?  ?  ?  ? ?Walk 10 feet on uneven surface  ?activity ? ? ?Assist Walk 10 feet on uneven surfaces activity did not occur: Safety/medical concerns ? ? ?Assist level: Minimal Assistance - Patient > 75% ?Assistive device: Walker-rolling  ? ?Wheelchair ? ? ? ? ?Assist Is the patient  using a wheelchair?: Yes ?Type of Wheelchair: Manual ?  ? ?Wheelchair assist level: Dependent - Patient 0% ?Max wheelchair distance: 150'  ? ? ?Wheelchair 50 feet with 2 turns activity ? ? ? ?Assist ? ?  ?  ? ? ?Assist Level: Dependent - Patient 0%  ? ?Wheelchair 150 feet activity  ? ? ? ?Assist ?   ? ? ?Assist Level: Dependent - Patient 0%  ? ?Blood pressure (!) 148/88, pulse 84, temperature 97.8 ?F (36.6 ?C), temperature source Oral, resp. rate 14, height '5\' 9"'$  (1.753 m), weight 115.3 kg, SpO2 96 %. ? ?Medical Problem List and Plan: ?1. Functional deficits secondary to pseudo-exacerbation of parkinson's disease/disability ?            -patient may shower ?            -ELOS/Goals: 7-10 days supervision to min assist goals ? -Home today ?2.  Antithrombotics: ?-DVT/anticoagulation:  Pharmaceutical: Xarelto ?            -antiplatelet therapy: n/a ?3. Pain Management: tramadol and tylenol prn. Appears fairly well managed at present ?            -monitor activity tolerance ? 5/9- will add lidoderm patches 8pm to 8am- advised pt to have nursing remove in AM ? 5/10- has helped some- con't regimen ? 5/11- needs to have patches changed daily.  ?4. Mood: team to provide ego support ?            -klonopin prn for anxiety ?            -antipsychotic agents: n/a ?5. Neuropsych: This patient is capable of making decisions on his own behalf. ?6. Skin/Wound Care: local skin, foley care ?7. Fluids/Electrolytes/Nutrition: encourage appropriate nutrition ?            5/15 Hypo K+ increase KCL to BID, recheck in am .  hypomagnesia-resolved now in 1.7-1.8 range ? 5/16 K+ 4.2 today, improved, KCL to daily, follow ?8. Parkinson's Disease: ?            -amantadine, sinemet, comtan per baseline regimen ? -seems fairly well compensated ?9. AKI/Urine retention/BPH: ?            -Foley in place and will follow up with urology ([place]) in about 2 weeks ?            -finasteride and fesoterodine per urology ? -5/12- Urology wants foley to  remain in place until d/c.  To f/u with Urology after d/c.  ? -5/13 Wife reports urology f/u is scheduled after D/C, continue current plan ? -5/16 Cr stable at 0.75, continue foley ?10. HTN: 5/7 remains quite elevate ?            -  continue norvasc, lopressor  ?            -add in benazepril which he used in combo with amlodipine pta  ?  ?Vitals:  ? 06/04/21 2035 06/05/21 0426  ?BP: (!) 136/96 (!) 148/88  ?Pulse: 76 84  ?Resp: 14 14  ?Temp: 98.3 ?F (36.8 ?C) 97.8 ?F (36.6 ?C)  ?SpO2: 97% 96%  ?BP a little higher today but overall well controlled, continue to monitor as outpatient ? ?11. Slow transit constipation: ?            -scheduled miralax and senna-s ?            -5/17 BM today ?12. Hx of CAD/persistent A -fib --lopressor, lipitor, xarelto ?       BP/HR controled  ?13. OSA: not currently using bipap ?14. Morbid obesity. Encourage healthy diet ?15. Right Renal mass : c/w cystic lesion on MRI ? 16. Grief- ?06/13/22- Good friend died suddenly- will monitor closely- d/w nursing to keep an eye on him.  ? ? ?LOS: ?11 days ?A FACE TO FACE EVALUATION WAS PERFORMED ? ?Jennye Boroughs ?06/05/2021, 7:43 AM  ? ?  ?

## 2021-06-05 NOTE — Plan of Care (Signed)

## 2021-06-05 NOTE — Telephone Encounter (Addendum)
Xarelto '20mg'$  refill request received. Pt is 71 years old, weight-115.3kg, Crea-0.75 on 06/04/2021, last seen by Dr. Radford Pax on 11/11/2019 via Telemedicine and pending an appt on 07/05/2021 via Telemedicine, [place], CrCl-149.30m/min; Dose is appropriate based on dosing criteria.  ? ?Pt is currently admitted   ?

## 2021-06-06 DIAGNOSIS — R338 Other retention of urine: Secondary | ICD-10-CM | POA: Diagnosis not present

## 2021-06-06 DIAGNOSIS — N281 Cyst of kidney, acquired: Secondary | ICD-10-CM | POA: Diagnosis not present

## 2021-06-06 DIAGNOSIS — N401 Enlarged prostate with lower urinary tract symptoms: Secondary | ICD-10-CM | POA: Diagnosis not present

## 2021-06-06 NOTE — Telephone Encounter (Signed)
Pt discharged from hospital. Appropriate dose and refill sent to requested pharmacy.

## 2021-06-07 DIAGNOSIS — I10 Essential (primary) hypertension: Secondary | ICD-10-CM | POA: Diagnosis not present

## 2021-06-07 DIAGNOSIS — Z466 Encounter for fitting and adjustment of urinary device: Secondary | ICD-10-CM | POA: Diagnosis not present

## 2021-06-07 DIAGNOSIS — I4821 Permanent atrial fibrillation: Secondary | ICD-10-CM | POA: Diagnosis not present

## 2021-06-07 DIAGNOSIS — G20 Parkinson's disease: Secondary | ICD-10-CM | POA: Diagnosis not present

## 2021-06-07 DIAGNOSIS — Z955 Presence of coronary angioplasty implant and graft: Secondary | ICD-10-CM | POA: Diagnosis not present

## 2021-06-07 DIAGNOSIS — Z6841 Body Mass Index (BMI) 40.0 and over, adult: Secondary | ICD-10-CM | POA: Diagnosis not present

## 2021-06-07 DIAGNOSIS — E785 Hyperlipidemia, unspecified: Secondary | ICD-10-CM | POA: Diagnosis not present

## 2021-06-07 DIAGNOSIS — G4733 Obstructive sleep apnea (adult) (pediatric): Secondary | ICD-10-CM | POA: Diagnosis not present

## 2021-06-07 DIAGNOSIS — Z9181 History of falling: Secondary | ICD-10-CM | POA: Diagnosis not present

## 2021-06-07 DIAGNOSIS — I2510 Atherosclerotic heart disease of native coronary artery without angina pectoris: Secondary | ICD-10-CM | POA: Diagnosis not present

## 2021-06-07 DIAGNOSIS — R338 Other retention of urine: Secondary | ICD-10-CM | POA: Diagnosis not present

## 2021-06-07 DIAGNOSIS — N401 Enlarged prostate with lower urinary tract symptoms: Secondary | ICD-10-CM | POA: Diagnosis not present

## 2021-06-07 DIAGNOSIS — Z7901 Long term (current) use of anticoagulants: Secondary | ICD-10-CM | POA: Diagnosis not present

## 2021-06-07 DIAGNOSIS — F4323 Adjustment disorder with mixed anxiety and depressed mood: Secondary | ICD-10-CM | POA: Diagnosis not present

## 2021-06-07 DIAGNOSIS — K219 Gastro-esophageal reflux disease without esophagitis: Secondary | ICD-10-CM | POA: Diagnosis not present

## 2021-06-07 DIAGNOSIS — Z86010 Personal history of colonic polyps: Secondary | ICD-10-CM | POA: Diagnosis not present

## 2021-06-10 DIAGNOSIS — I10 Essential (primary) hypertension: Secondary | ICD-10-CM | POA: Diagnosis not present

## 2021-06-10 DIAGNOSIS — I2510 Atherosclerotic heart disease of native coronary artery without angina pectoris: Secondary | ICD-10-CM | POA: Diagnosis not present

## 2021-06-10 DIAGNOSIS — R338 Other retention of urine: Secondary | ICD-10-CM | POA: Diagnosis not present

## 2021-06-10 DIAGNOSIS — G20 Parkinson's disease: Secondary | ICD-10-CM | POA: Diagnosis not present

## 2021-06-10 DIAGNOSIS — Z466 Encounter for fitting and adjustment of urinary device: Secondary | ICD-10-CM | POA: Diagnosis not present

## 2021-06-10 DIAGNOSIS — N401 Enlarged prostate with lower urinary tract symptoms: Secondary | ICD-10-CM | POA: Diagnosis not present

## 2021-06-11 DIAGNOSIS — I10 Essential (primary) hypertension: Secondary | ICD-10-CM | POA: Diagnosis not present

## 2021-06-11 DIAGNOSIS — R338 Other retention of urine: Secondary | ICD-10-CM | POA: Diagnosis not present

## 2021-06-11 DIAGNOSIS — Z466 Encounter for fitting and adjustment of urinary device: Secondary | ICD-10-CM | POA: Diagnosis not present

## 2021-06-11 DIAGNOSIS — N401 Enlarged prostate with lower urinary tract symptoms: Secondary | ICD-10-CM | POA: Diagnosis not present

## 2021-06-11 DIAGNOSIS — I2510 Atherosclerotic heart disease of native coronary artery without angina pectoris: Secondary | ICD-10-CM | POA: Diagnosis not present

## 2021-06-11 DIAGNOSIS — G20 Parkinson's disease: Secondary | ICD-10-CM | POA: Diagnosis not present

## 2021-06-12 ENCOUNTER — Other Ambulatory Visit: Payer: Self-pay | Admitting: Physician Assistant

## 2021-06-12 ENCOUNTER — Encounter (HOSPITAL_COMMUNITY): Payer: Self-pay | Admitting: Emergency Medicine

## 2021-06-12 ENCOUNTER — Other Ambulatory Visit: Payer: Self-pay

## 2021-06-12 ENCOUNTER — Emergency Department (HOSPITAL_COMMUNITY): Payer: Medicare Other

## 2021-06-12 ENCOUNTER — Inpatient Hospital Stay (HOSPITAL_COMMUNITY)
Admission: EM | Admit: 2021-06-12 | Discharge: 2021-06-17 | DRG: 690 | Disposition: A | Discharge status: Skilled Nursing Facility | Payer: Medicare Other | Attending: Family Medicine | Admitting: Family Medicine

## 2021-06-12 DIAGNOSIS — E876 Hypokalemia: Secondary | ICD-10-CM | POA: Diagnosis present

## 2021-06-12 DIAGNOSIS — R000 Tachycardia, unspecified: Secondary | ICD-10-CM | POA: Diagnosis not present

## 2021-06-12 DIAGNOSIS — I10 Essential (primary) hypertension: Secondary | ICD-10-CM | POA: Diagnosis present

## 2021-06-12 DIAGNOSIS — Z66 Do not resuscitate: Secondary | ICD-10-CM | POA: Diagnosis present

## 2021-06-12 DIAGNOSIS — I4821 Permanent atrial fibrillation: Secondary | ICD-10-CM | POA: Diagnosis present

## 2021-06-12 DIAGNOSIS — E785 Hyperlipidemia, unspecified: Secondary | ICD-10-CM | POA: Diagnosis present

## 2021-06-12 DIAGNOSIS — R531 Weakness: Secondary | ICD-10-CM | POA: Diagnosis not present

## 2021-06-12 DIAGNOSIS — N390 Urinary tract infection, site not specified: Secondary | ICD-10-CM | POA: Diagnosis not present

## 2021-06-12 DIAGNOSIS — Z79899 Other long term (current) drug therapy: Secondary | ICD-10-CM | POA: Diagnosis not present

## 2021-06-12 DIAGNOSIS — M6259 Muscle wasting and atrophy, not elsewhere classified, multiple sites: Secondary | ICD-10-CM | POA: Diagnosis not present

## 2021-06-12 DIAGNOSIS — Z466 Encounter for fitting and adjustment of urinary device: Secondary | ICD-10-CM | POA: Diagnosis not present

## 2021-06-12 DIAGNOSIS — Z7401 Bed confinement status: Secondary | ICD-10-CM | POA: Diagnosis not present

## 2021-06-12 DIAGNOSIS — B964 Proteus (mirabilis) (morganii) as the cause of diseases classified elsewhere: Secondary | ICD-10-CM | POA: Diagnosis present

## 2021-06-12 DIAGNOSIS — R339 Retention of urine, unspecified: Secondary | ICD-10-CM | POA: Diagnosis not present

## 2021-06-12 DIAGNOSIS — G4733 Obstructive sleep apnea (adult) (pediatric): Secondary | ICD-10-CM | POA: Diagnosis not present

## 2021-06-12 DIAGNOSIS — G20A1 Parkinson's disease without dyskinesia, without mention of fluctuations: Secondary | ICD-10-CM | POA: Diagnosis present

## 2021-06-12 DIAGNOSIS — N179 Acute kidney failure, unspecified: Secondary | ICD-10-CM | POA: Diagnosis present

## 2021-06-12 DIAGNOSIS — I2510 Atherosclerotic heart disease of native coronary artery without angina pectoris: Secondary | ICD-10-CM | POA: Diagnosis present

## 2021-06-12 DIAGNOSIS — Z882 Allergy status to sulfonamides status: Secondary | ICD-10-CM | POA: Diagnosis not present

## 2021-06-12 DIAGNOSIS — Z7901 Long term (current) use of anticoagulants: Secondary | ICD-10-CM | POA: Diagnosis not present

## 2021-06-12 DIAGNOSIS — M6281 Muscle weakness (generalized): Secondary | ICD-10-CM | POA: Diagnosis not present

## 2021-06-12 DIAGNOSIS — R41841 Cognitive communication deficit: Secondary | ICD-10-CM | POA: Diagnosis not present

## 2021-06-12 DIAGNOSIS — R2689 Other abnormalities of gait and mobility: Secondary | ICD-10-CM | POA: Diagnosis not present

## 2021-06-12 DIAGNOSIS — R5381 Other malaise: Secondary | ICD-10-CM | POA: Diagnosis present

## 2021-06-12 DIAGNOSIS — Z1629 Resistance to other single specified antibiotic: Secondary | ICD-10-CM | POA: Diagnosis not present

## 2021-06-12 DIAGNOSIS — R1312 Dysphagia, oropharyngeal phase: Secondary | ICD-10-CM | POA: Diagnosis not present

## 2021-06-12 DIAGNOSIS — K219 Gastro-esophageal reflux disease without esophagitis: Secondary | ICD-10-CM | POA: Diagnosis present

## 2021-06-12 DIAGNOSIS — N401 Enlarged prostate with lower urinary tract symptoms: Secondary | ICD-10-CM | POA: Diagnosis present

## 2021-06-12 DIAGNOSIS — R410 Disorientation, unspecified: Secondary | ICD-10-CM | POA: Diagnosis not present

## 2021-06-12 DIAGNOSIS — Z7189 Other specified counseling: Secondary | ICD-10-CM | POA: Diagnosis not present

## 2021-06-12 DIAGNOSIS — Z6838 Body mass index (BMI) 38.0-38.9, adult: Secondary | ICD-10-CM | POA: Diagnosis not present

## 2021-06-12 DIAGNOSIS — R338 Other retention of urine: Secondary | ICD-10-CM | POA: Diagnosis not present

## 2021-06-12 DIAGNOSIS — G20 Parkinson's disease: Secondary | ICD-10-CM | POA: Diagnosis present

## 2021-06-12 DIAGNOSIS — R0902 Hypoxemia: Secondary | ICD-10-CM | POA: Diagnosis not present

## 2021-06-12 DIAGNOSIS — R498 Other voice and resonance disorders: Secondary | ICD-10-CM | POA: Diagnosis not present

## 2021-06-12 DIAGNOSIS — K5900 Constipation, unspecified: Secondary | ICD-10-CM | POA: Diagnosis not present

## 2021-06-12 DIAGNOSIS — I4819 Other persistent atrial fibrillation: Secondary | ICD-10-CM | POA: Diagnosis not present

## 2021-06-12 DIAGNOSIS — Z87442 Personal history of urinary calculi: Secondary | ICD-10-CM

## 2021-06-12 DIAGNOSIS — Z955 Presence of coronary angioplasty implant and graft: Secondary | ICD-10-CM

## 2021-06-12 DIAGNOSIS — E669 Obesity, unspecified: Secondary | ICD-10-CM

## 2021-06-12 DIAGNOSIS — R4182 Altered mental status, unspecified: Secondary | ICD-10-CM | POA: Diagnosis not present

## 2021-06-12 DIAGNOSIS — R262 Difficulty in walking, not elsewhere classified: Secondary | ICD-10-CM | POA: Diagnosis not present

## 2021-06-12 DIAGNOSIS — Z515 Encounter for palliative care: Secondary | ICD-10-CM | POA: Diagnosis not present

## 2021-06-12 DIAGNOSIS — I4891 Unspecified atrial fibrillation: Secondary | ICD-10-CM | POA: Diagnosis not present

## 2021-06-12 LAB — CBC WITH DIFFERENTIAL/PLATELET
Abs Immature Granulocytes: 0.03 10*3/uL (ref 0.00–0.07)
Basophils Absolute: 0 10*3/uL (ref 0.0–0.1)
Basophils Relative: 0 %
Eosinophils Absolute: 0.1 10*3/uL (ref 0.0–0.5)
Eosinophils Relative: 1 %
HCT: 46.2 % (ref 39.0–52.0)
Hemoglobin: 15.5 g/dL (ref 13.0–17.0)
Immature Granulocytes: 0 %
Lymphocytes Relative: 10 %
Lymphs Abs: 0.9 10*3/uL (ref 0.7–4.0)
MCH: 33 pg (ref 26.0–34.0)
MCHC: 33.5 g/dL (ref 30.0–36.0)
MCV: 98.3 fL (ref 80.0–100.0)
Monocytes Absolute: 0.6 10*3/uL (ref 0.1–1.0)
Monocytes Relative: 7 %
Neutro Abs: 7 10*3/uL (ref 1.7–7.7)
Neutrophils Relative %: 82 %
Platelets: 155 10*3/uL (ref 150–400)
RBC: 4.7 MIL/uL (ref 4.22–5.81)
RDW: 12.9 % (ref 11.5–15.5)
WBC: 8.6 10*3/uL (ref 4.0–10.5)
nRBC: 0 % (ref 0.0–0.2)

## 2021-06-12 LAB — URINALYSIS, ROUTINE W REFLEX MICROSCOPIC
Bilirubin Urine: NEGATIVE
Glucose, UA: NEGATIVE mg/dL
Ketones, ur: 5 mg/dL — AB
Nitrite: POSITIVE — AB
Protein, ur: 100 mg/dL — AB
RBC / HPF: >50 RBC/hpf — ABNORMAL HIGH (ref 0–5)
Specific Gravity, Urine: 1.015 (ref 1.005–1.030)
WBC, UA: >50 WBC/hpf — ABNORMAL HIGH (ref 0–5)
pH: 8 (ref 5.0–8.0)

## 2021-06-12 LAB — COMPREHENSIVE METABOLIC PANEL
ALT: 7 U/L (ref 0–44)
AST: 18 U/L (ref 15–41)
Albumin: 3.2 g/dL — ABNORMAL LOW (ref 3.5–5.0)
Alkaline Phosphatase: 88 U/L (ref 38–126)
Anion gap: 8 (ref 5–15)
BUN: 10 mg/dL (ref 8–23)
CO2: 25 mmol/L (ref 22–32)
Calcium: 8.7 mg/dL — ABNORMAL LOW (ref 8.9–10.3)
Chloride: 106 mmol/L (ref 98–111)
Creatinine, Ser: 0.73 mg/dL (ref 0.61–1.24)
GFR, Estimated: >60 mL/min (ref >60)
Glucose, Bld: 136 mg/dL — ABNORMAL HIGH (ref 70–99)
Potassium: 4.3 mmol/L (ref 3.5–5.1)
Sodium: 139 mmol/L (ref 135–145)
Total Bilirubin: 1.3 mg/dL — ABNORMAL HIGH (ref 0.3–1.2)
Total Protein: 6.4 g/dL — ABNORMAL LOW (ref 6.5–8.1)

## 2021-06-12 LAB — TROPONIN I (HIGH SENSITIVITY)
Troponin I (High Sensitivity): 5 ng/L (ref <18)
Troponin I (High Sensitivity): 7 ng/L (ref <18)

## 2021-06-12 LAB — LACTIC ACID, PLASMA: Lactic Acid, Venous: 1.6 mmol/L (ref 0.5–1.9)

## 2021-06-12 MED ORDER — OXYCODONE HCL 5 MG PO TABS
5.0000 mg | ORAL_TABLET | ORAL | Status: DC | PRN
  Administered 2021-06-13 – 2021-06-14 (×2): 5 mg via ORAL
  Filled 2021-06-12 (×2): qty 1

## 2021-06-12 MED ORDER — ATORVASTATIN CALCIUM 10 MG PO TABS
10.0000 mg | ORAL_TABLET | ORAL | Status: DC
  Administered 2021-06-12 – 2021-06-16 (×3): 10 mg via ORAL
  Filled 2021-06-12 (×4): qty 1

## 2021-06-12 MED ORDER — ACETAMINOPHEN 325 MG PO TABS
650.0000 mg | ORAL_TABLET | Freq: Four times a day (QID) | ORAL | Status: DC | PRN
Start: 2021-06-12 — End: 2021-06-17
  Administered 2021-06-12 – 2021-06-13 (×2): 650 mg via ORAL
  Filled 2021-06-12 (×2): qty 2

## 2021-06-12 MED ORDER — RIVAROXABAN 10 MG PO TABS
20.0000 mg | ORAL_TABLET | Freq: Every day | ORAL | Status: DC
  Administered 2021-06-12 – 2021-06-16 (×5): 20 mg via ORAL
  Filled 2021-06-12 (×5): qty 2

## 2021-06-12 MED ORDER — SODIUM CHLORIDE 0.9% FLUSH
3.0000 mL | Freq: Two times a day (BID) | INTRAVENOUS | Status: DC
  Administered 2021-06-13 – 2021-06-17 (×8): 3 mL via INTRAVENOUS

## 2021-06-12 MED ORDER — ENTACAPONE 200 MG PO TABS
200.0000 mg | ORAL_TABLET | Freq: Three times a day (TID) | ORAL | Status: DC
  Administered 2021-06-12 – 2021-06-17 (×15): 200 mg via ORAL
  Filled 2021-06-12 (×18): qty 1

## 2021-06-12 MED ORDER — POLYETHYLENE GLYCOL 3350 17 G PO PACK
17.0000 g | PACK | Freq: Every day | ORAL | Status: DC
  Administered 2021-06-13 – 2021-06-15 (×3): 17 g via ORAL
  Filled 2021-06-12 (×4): qty 1

## 2021-06-12 MED ORDER — HYDRALAZINE HCL 20 MG/ML IJ SOLN: 5.0000 mg | INTRAMUSCULAR | Status: DC | PRN

## 2021-06-12 MED ORDER — TADALAFIL 5 MG PO TABS
5.0000 mg | ORAL_TABLET | Freq: Every evening | ORAL | Status: DC
  Administered 2021-06-12 – 2021-06-16 (×5): 5 mg via ORAL
  Filled 2021-06-12 (×6): qty 1

## 2021-06-12 MED ORDER — CLONAZEPAM 0.125 MG PO TBDP: 0.2500 mg | ORAL_TABLET | Freq: Two times a day (BID) | ORAL | Status: DC | PRN

## 2021-06-12 MED ORDER — SODIUM CHLORIDE 0.9 % IV SOLN
2.0000 g | Freq: Two times a day (BID) | INTRAVENOUS | Status: DC
  Administered 2021-06-12 – 2021-06-15 (×6): 2 g via INTRAVENOUS
  Filled 2021-06-12 (×6): qty 12.5

## 2021-06-12 MED ORDER — AMANTADINE HCL 100 MG PO CAPS
100.0000 mg | ORAL_CAPSULE | Freq: Every day | ORAL | Status: DC
  Administered 2021-06-13 – 2021-06-17 (×5): 100 mg via ORAL
  Filled 2021-06-12 (×5): qty 1

## 2021-06-12 MED ORDER — ATORVASTATIN CALCIUM 10 MG PO TABS: 5.0000 mg | ORAL_TABLET | ORAL | Status: DC

## 2021-06-12 MED ORDER — BISACODYL 5 MG PO TBEC
5.0000 mg | DELAYED_RELEASE_TABLET | Freq: Every day | ORAL | Status: DC | PRN
  Administered 2021-06-14: 5 mg via ORAL
  Filled 2021-06-12 (×2): qty 1

## 2021-06-12 MED ORDER — SODIUM CHLORIDE 0.9 % IV BOLUS: 1000.0000 mL | Freq: Once | INTRAVENOUS | Status: DC

## 2021-06-12 MED ORDER — ATORVASTATIN CALCIUM 10 MG PO TABS
5.0000 mg | ORAL_TABLET | ORAL | Status: DC
  Administered 2021-06-13 – 2021-06-17 (×3): 5 mg via ORAL
  Filled 2021-06-12 (×4): qty 1

## 2021-06-12 MED ORDER — LACTATED RINGERS IV SOLN: INTRAVENOUS | Status: DC

## 2021-06-12 MED ORDER — TRAMADOL HCL 50 MG PO TABS
50.0000 mg | ORAL_TABLET | Freq: Three times a day (TID) | ORAL | Status: DC | PRN
  Administered 2021-06-12: 50 mg via ORAL
  Filled 2021-06-12: qty 1

## 2021-06-12 MED ORDER — MORPHINE SULFATE (PF) 2 MG/ML IV SOLN: 2.0000 mg | INTRAVENOUS | Status: DC | PRN

## 2021-06-12 MED ORDER — ONDANSETRON HCL 4 MG PO TABS: 4.0000 mg | ORAL_TABLET | Freq: Four times a day (QID) | ORAL | Status: DC | PRN

## 2021-06-12 MED ORDER — SENNOSIDES-DOCUSATE SODIUM 8.6-50 MG PO TABS
1.0000 | ORAL_TABLET | Freq: Two times a day (BID) | ORAL | Status: DC
  Administered 2021-06-12 – 2021-06-15 (×7): 1 via ORAL
  Filled 2021-06-12 (×8): qty 1

## 2021-06-12 MED ORDER — SODIUM CHLORIDE 0.9 % IV SOLN
2.0000 g | Freq: Once | INTRAVENOUS | Status: AC
  Administered 2021-06-12: 2 g via INTRAVENOUS
  Filled 2021-06-12: qty 20

## 2021-06-12 MED ORDER — LACTATED RINGERS IV BOLUS
1000.0000 mL | Freq: Once | INTRAVENOUS | Status: AC
Start: 2021-06-12 — End: 2021-06-12
  Administered 2021-06-12: 1000 mL via INTRAVENOUS

## 2021-06-12 MED ORDER — DOCUSATE SODIUM 100 MG PO CAPS
100.0000 mg | ORAL_CAPSULE | Freq: Two times a day (BID) | ORAL | Status: DC
  Administered 2021-06-12 – 2021-06-15 (×7): 100 mg via ORAL
  Filled 2021-06-12 (×8): qty 1

## 2021-06-12 MED ORDER — CARBIDOPA-LEVODOPA 25-100 MG PO TABS
3.0000 | ORAL_TABLET | Freq: Three times a day (TID) | ORAL | Status: DC
  Administered 2021-06-12 – 2021-06-17 (×15): 3 via ORAL
  Filled 2021-06-12 (×17): qty 3

## 2021-06-12 MED ORDER — FINASTERIDE 5 MG PO TABS
5.0000 mg | ORAL_TABLET | Freq: Every day | ORAL | Status: DC
  Administered 2021-06-13 – 2021-06-17 (×5): 5 mg via ORAL
  Filled 2021-06-12 (×5): qty 1

## 2021-06-12 MED ORDER — ACETAMINOPHEN 650 MG RE SUPP
650.0000 mg | Freq: Four times a day (QID) | RECTAL | Status: DC | PRN
Start: 2021-06-12 — End: 2021-06-17

## 2021-06-12 MED ORDER — METOPROLOL TARTRATE 25 MG PO TABS
50.0000 mg | ORAL_TABLET | Freq: Two times a day (BID) | ORAL | Status: DC
Start: 2021-06-12 — End: 2021-06-15
  Administered 2021-06-12 – 2021-06-15 (×6): 50 mg via ORAL
  Filled 2021-06-12 (×6): qty 2

## 2021-06-12 MED ORDER — POLYETHYLENE GLYCOL 3350 17 G PO PACK
17.0000 g | PACK | Freq: Every day | ORAL | Status: DC | PRN
  Filled 2021-06-12: qty 1

## 2021-06-12 MED ORDER — ONDANSETRON HCL 4 MG/2ML IJ SOLN: 4.0000 mg | Freq: Four times a day (QID) | INTRAMUSCULAR | Status: DC | PRN

## 2021-06-12 NOTE — ED Notes (Signed)
Patient returned from [employer]. Admitting at bedside.

## 2021-06-12 NOTE — Progress Notes (Signed)
Purulent discharge noted from urethral meatus and urine output, MD notified.

## 2021-06-12 NOTE — Progress Notes (Signed)
   06/12/21 1814  Assess: MEWS Score  Temp 98.3 F (36.8 C)  BP (!) 157/98  Pulse Rate (!) 126  Resp (!) 23  SpO2 90 %  Assess: MEWS Score  MEWS Temp 0  MEWS Systolic 0  MEWS Pulse 2  MEWS RR 1  MEWS LOC 0  MEWS Score 3  MEWS Score Color Yellow  Assess: if the MEWS score is Yellow or Red  Were vital signs taken at a resting state? Yes  Focused Assessment No change from prior assessment  Early Detection of Sepsis Score *See Row Information* Low  MEWS guidelines implemented *See Row Information* Yes  Treat  MEWS Interventions Administered scheduled meds/treatments;Administered prn meds/treatments  Pain Scale 0-10  Pain Score 0  Patients Stated Pain Goal 0  Take Vital Signs  Increase Vital Sign Frequency  Yellow: Q 2hr X 2 then Q 4hr X 2, if remains yellow, continue Q 4hrs  Escalate  MEWS: Escalate Yellow: discuss with charge nurse/RN and consider discussing with provider and RRT  Notify: Charge Nurse/RN  Name of Charge Nurse/RN Notified Nichola RN  Date Charge Nurse/RN Notified 06/12/21  Time Charge Nurse/RN Notified [place]  Notify: Provider  Provider Name/Title yates  Date Provider Notified 06/12/21  Time Provider Notified [id number]  Method of Notification Page  Provider response See new orders  Date of Provider Response 06/12/21  Time of Provider Response 1840  Document  Patient Outcome Stabilized after interventions

## 2021-06-12 NOTE — ED Notes (Signed)
Family updated as to patient's status and is at bedside.

## 2021-06-12 NOTE — ED Notes (Signed)
Patient arrives with foley catheter from recent hospitalization. Has appt with urology tomorrow morning.

## 2021-06-12 NOTE — ED Notes (Signed)
ED TO INPATIENT HANDOFF REPORT  ED Nurse Name and Phone #: Joellen Jersey [phone]  S Name/Age/Gender Isaiah Pena 71 y.o. male Room/Bed: 020C/020C  Code Status   Code Status: Prior  Home/SNF/Other Home Patient oriented to: self, place, and situation Is this baseline? No   Triage Complete: Triage complete  Chief Complaint Complicated UTI (urinary tract infection) [N39.0]  Triage Note Per [facility] pt coming from home- was recently in hospital for urinary retention. C/o decreased mobility over the past 48 hours. Patient c/o chronic lower back pain. Patient alert to self, location and situation but unsure of date.    Allergies Allergies  Allergen Reactions   Sulfa Antibiotics Hives    Level of Care/Admitting Diagnosis ED Disposition     ED Disposition  Admit   Condition  --   Comment  Hospital Area: [facility] [[id number]]  Level of Care: Telemetry Medical [104]  May admit patient to [facility] or [facility] if equivalent level of care is available:: Yes  Covid Evaluation: Asymptomatic - no recent exposure (last 10 days) testing not required  Diagnosis: Complicated UTI (urinary tract infection) [732202]  Admitting Physician: Karmen Bongo [[id number]]  Attending Physician: Karmen Bongo [[id number]]  Estimated length of stay: past midnight tomorrow  Certification:: I certify this patient will need inpatient services for at least 2 midnights          B Medical/Surgery History Past Medical History:  Diagnosis Date   Anticoagulant long-term use    xarelto   Bilateral lower extremity edema    BPH (benign prostatic hyperplasia)    BPH (benign prostatic hyperplasia)    Central serous retinopathy    LOSS OF CENTER VISION LEFT EYE W/ BLURRED VISION-  was treated  methotrexate by specialist at [facility] and released   Coronary artery disease cardiologist-  dr Tressia Miners turner   01/ 2004  abnormal cardiolite  s/p  cardiac cath w/ PCI and DES to dRCA   Edema of  extremities    CHRONIC    Erectile dysfunction    GERD (gastroesophageal reflux disease)    History of colon polyps    2011 per pt benign   History of kidney stones    History of simple renal cyst    drained via radiology   History of torn meniscus of right knee    Hyperlipidemia    Hypertension    Kidney stone    HX OF STONE-PASSED    Lower urinary tract symptoms (LUTS)    OSA (obstructive sleep apnea) 09/29/2011   NPSG 2004:  AHI 25/hr  Rx with bilevel.      OSA treated with BiPAP    moderate per study 01-23-2002   Parkinson's disease [facility])    neurologist-  dr ed hill ([facility])   Permanent atrial fibrillation ([place])    Persistent atrial fibrillation ([place]) 11/08/2012   S/P drug eluting coronary stent placement    01/ 2004  x1 to dRCA   Past Surgical History:  Procedure Laterality Date   CARDIOVASCULAR STRESS TEST  05-07-2015   dr Tressia Miners turner   normal nuclear study w/ no ischemia/  normal LV function and wall motion , stress ef 55%   COLONOSCOPY WITH PROPOFOL  2011   CORONARY ANGIOPLASTY WITH STENT PLACEMENT  02-11-2002  dr Daneen Schick   abnormal cardiolite--  PCI and DES to dRCA (99%),  pRCA luminal irregularities, mild to moderate LAD and CFX disease,  normal LVF   CYSTOSCOPY WITH INSERTION OF UROLIFT  N/A 01/28/2016   Procedure: CYSTOSCOPY WITH INSERTION OF UROLIFT;  Surgeon: Franchot Gallo, MD;  Location: [facility];  Service: Urology;  Laterality: N/A;   HERNIA REPAIR     AGE 19   KNEE ARTHROSCOPY  03/21/2011   Procedure: ARTHROSCOPY KNEE;  Surgeon: Tobi Bastos, MD;  Location: [facility];  Service: Orthopedics;  Laterality: Right;     A IV Location/Drains/Wounds Patient Lines/Drains/Airways Status     Active Line/Drains/Airways     Name Placement date Placement time Site Days   Peripheral IV 06/12/21 20 G Right Antecubital 06/12/21  1405  Antecubital  less than 1   Urethral Catheter Katie R RN Latex 16 Fr. 06/12/21  1427  Latex  less than 1             Intake/Output Last 24 hours  Intake/Output Summary (Last 24 hours) at 06/12/2021 1558 Last data filed at 06/12/2021 1542 Gross per 24 hour  Intake 100 ml  Output 75 ml  Net 25 ml    Labs/Imaging Results for orders placed or performed during the hospital encounter of 06/12/21 (from the past 48 hour(s))  Comprehensive metabolic panel     Status: Abnormal   Collection Time: 06/12/21  2:05 PM  Result Value Ref Range   Sodium 139 135 - 145 mmol/L   Potassium 4.3 3.5 - 5.1 mmol/L   Chloride 106 98 - 111 mmol/L   CO2 25 22 - 32 mmol/L   Glucose, Bld 136 (H) 70 - 99 mg/dL    Comment: Glucose reference range applies only to samples taken after fasting for at least 8 hours.   BUN 10 8 - 23 mg/dL   Creatinine, Ser 0.73 0.61 - 1.24 mg/dL   Calcium 8.7 (L) 8.9 - 10.3 mg/dL   Total Protein 6.4 (L) 6.5 - 8.1 g/dL   Albumin 3.2 (L) 3.5 - 5.0 g/dL   AST 18 15 - 41 U/L   ALT 7 0 - 44 U/L   Alkaline Phosphatase 88 38 - 126 U/L   Total Bilirubin 1.3 (H) 0.3 - 1.2 mg/dL   GFR, Estimated >60 >60 mL/min    Comment: (NOTE) Calculated using the CKD-EPI Creatinine Equation (2021)    Anion gap 8 5 - 15    Comment: Performed at [facility], [place] [address] [place], [place] [address]  CBC with Differential     Status: None   Collection Time: 06/12/21  2:05 PM  Result Value Ref Range   WBC 8.6 4.0 - 10.5 K/uL   RBC 4.70 4.22 - 5.81 MIL/uL   Hemoglobin 15.5 13.0 - 17.0 g/dL   HCT 46.2 39.0 - 52.0 %   MCV 98.3 80.0 - 100.0 fL   MCH 33.0 26.0 - 34.0 pg   MCHC 33.5 30.0 - 36.0 g/dL   RDW 12.9 11.5 - 15.5 %   Platelets 155 150 - 400 K/uL   nRBC 0.0 0.0 - 0.2 %   Neutrophils Relative % 82 %   Neutro Abs 7.0 1.7 - 7.7 K/uL   Lymphocytes Relative 10 %   Lymphs Abs 0.9 0.7 - 4.0 K/uL   Monocytes Relative 7 %   Monocytes Absolute 0.6 0.1 - 1.0 K/uL   Eosinophils Relative 1 %   Eosinophils Absolute 0.1 0.0 - 0.5 K/uL   Basophils Relative 0 %   Basophils Absolute 0.0 0.0 - 0.1  K/uL   Immature Granulocytes 0 %   Abs Immature Granulocytes 0.03 0.00 - 0.07  K/uL    Comment: Performed at [facility], [place] [address] [place], [place] [address]  Troponin I (High Sensitivity)     Status: None   Collection Time: 06/12/21  2:05 PM  Result Value Ref Range   Troponin I (High Sensitivity) 5 <18 ng/L    Comment: (NOTE) Elevated high sensitivity troponin I (hsTnI) values and significant  changes across serial measurements may suggest ACS but many other  chronic and acute conditions are known to elevate hsTnI results.  Refer to the "Links" section for chest pain algorithms and additional  guidance. Performed at [facility], [place] [address] [place], [place] [address]   Lactic acid, plasma     Status: None   Collection Time: 06/12/21  2:07 PM  Result Value Ref Range   Lactic Acid, Venous 1.6 0.5 - 1.9 mmol/L    Comment: Performed at [place] [address], [place], [place] [address]  Urinalysis, Routine w reflex microscopic     Status: Abnormal   Collection Time: 06/12/21  2:42 PM  Result Value Ref Range   Color, Urine AMBER (A) YELLOW    Comment: BIOCHEMICALS MAY BE AFFECTED BY COLOR   APPearance TURBID (A) CLEAR   Specific Gravity, Urine 1.015 1.005 - 1.030   pH 8.0 5.0 - 8.0   Glucose, UA NEGATIVE NEGATIVE mg/dL   Hgb urine dipstick SMALL (A) NEGATIVE   Bilirubin Urine NEGATIVE NEGATIVE   Ketones, ur 5 (A) NEGATIVE mg/dL   Protein, ur 100 (A) NEGATIVE mg/dL   Nitrite POSITIVE (A) NEGATIVE   Leukocytes,Ua MODERATE (A) NEGATIVE   RBC / HPF >50 (H) 0 - 5 RBC/hpf   WBC, UA >50 (H) 0 - 5 WBC/hpf   Bacteria, UA MANY (A) NONE SEEN   WBC Clumps PRESENT    Mucus PRESENT    Triple Phosphate Crystal PRESENT    Non Squamous Epithelial 0-5 (A) NONE SEEN    Comment: Performed at [facility], [place] [address] [place],  [address]   DG Chest Portable 1 View  Result Date: 06/12/2021 CLINICAL DATA:  Weakness, recent hospitalization for  urine retention. EXAM: PORTABLE CHEST 1 VIEW COMPARISON:  Radiograph May 21, 2021 FINDINGS: Similar cardiomegaly. No overt pulmonary edema or focal airspace consolidation. No visible pleural effusion or pneumothorax. No acute osseous abnormality. IMPRESSION: Cardiomegaly without overt pulmonary edema or focal airspace consolidation. Electronically Signed   By: Dahlia Bailiff M.D.   On: 06/12/2021 15:01    Pending Labs Unresulted Labs (From admission, onward)    None       Vitals/Pain Today's Vitals   06/12/21 1354 06/12/21 1430 06/12/21 1500 06/12/21 1530  BP: (!) 149/97 (!) 155/89 (!) 141/91 132/88  Pulse: (!) 121 (!) 118 (!) 124 (!) 110  Resp: (!) 23 20 (!) 22 (!) 21  Temp:      SpO2: 94% 97% 97% 97%  Weight: 260 lb (117.9 kg)     Height: '5\' 9"'$  (1.753 m)       Isolation Precautions No active isolations  Medications Medications  lactated ringers bolus 1,000 mL (1,000 mLs Intravenous New Bag/Given 06/12/21 1436)  cefTRIAXone (ROCEPHIN) 2 g in sodium chloride 0.9 % 100 mL IVPB (0 g Intravenous Stopped 06/12/21 1542)    Mobility non-ambulatory High fall risk   Focused Assessments Cardiac Rhythm: Atrial fibrillation        R Recommendations: See Admitting Provider Note

## 2021-06-12 NOTE — ED Provider Notes (Signed)
[facility] EMERGENCY DEPARTMENT Provider Note   CSN: [id number] Arrival date & time: 06/12/21  1345     History  Chief Complaint  Patient presents with   Weakness    Isaiah Pena is a 71 y.o. male. Parkinson's disease, paroxysmal A-fib on  xarelto, CAD s/p PCI to RCA, HTN, HLD, OSA on bipap, BPH, PD presenting to ER due to concern for generalized weakness, confusion.  Patient recently admitted for AKI, urinary retention.  Has indwelling Foley catheter.  His significant other reports that he has been increasingly confused over the last couple days.  Has also been generally weak.  Has had increased issues with mobility.  Patient states that he is having some low back pain, endorses long history of low back pain and has no change from his normal low back pain.  Patient has some confusion but is able to answer basic questions appropriately.  Due to this confusion additional history is obtained from his significant other.  Additional history also obtained from review of this chart, recent discharge summary.  AKI with acute urinary retention.    HPI     Home Medications Prior to Admission medications   Medication Sig Start Date End Date Taking? Authorizing Provider  acetaminophen (TYLENOL) 650 MG CR tablet Take 2 tablets (1,300 mg total) by mouth every 8 (eight) hours as needed for pain. 06/05/21   Setzer, Edman Circle, PA-C  amantadine (SYMMETREL) 100 MG capsule Take 100 mg by mouth See admin instructions. Take one capsule (100 mg) by mouth daily at bedtime - 1am 03/14/21   [provider]  atorvastatin (LIPITOR) 10 MG tablet TAKE 1/2 TABLET BY MOUTH EVERY OTHER DAY AND ALTERNATING 1 TABLET BY MOUTH EVERY OTHER DAY. Please keep upcoming appt in April 2023 before anymore refills. Thank you Patient taking differently: Take 5-10 mg by mouth See admin instructions. Take 1/2 tablet (5 mg) by mouth every other evening, alternate with 1 tablet (10 mg) every other evening.  Please keep upcoming appt in April 2023 before anymore refills. Thank you 02/05/21   Sueanne Margarita, MD  carbidopa-levodopa (SINEMET IR) 25-100 MG tablet Take 3 tablets by mouth every 8 (eight) hours. 8am, 5pm and 1am 05/03/16   [provider]  clonazePAM (KLONOPIN) 0.5 MG tablet Take 0.25 mg by mouth 2 (two) times daily as needed for anxiety. 05/01/21   [provider]  Docusate Sodium (COLACE PO) Take 2 tablets by mouth 2 (two) times daily as needed (constipation).    [provider]  entacapone (COMTAN) 200 MG tablet Take 200 mg by mouth every 8 (eight) hours. 9am, 5pm, 1am 09/16/14   [provider]  esomeprazole (NEXIUM) 20 MG capsule Take 20 mg by mouth every other day.    [provider]  finasteride (PROSCAR) 5 MG tablet TAKE 1 TABLET(5 MG) BY MOUTH DAILY 06/05/21   Setzer, Edman Circle, PA-C  magnesium gluconate (MAGONATE) 500 MG tablet Take 1 tablet (500 mg total) by mouth 2 (two) times daily. 06/05/21   Setzer, Edman Circle, PA-C  metoprolol (LOPRESSOR) 50 MG tablet Take 50 mg by mouth 2 (two) times daily.    [provider]  polyethylene glycol (MIRALAX / GLYCOLAX) 17 g packet Take 17 g by mouth daily. 05/25/21   Shahmehdi, Valeria Batman, MD  potassium chloride SA (KLOR-CON M) 20 MEQ tablet Take 1 tablet (20 mEq total) by mouth every other day. 06/05/21   Setzer, Edman Circle, PA-C  rivaroxaban (XARELTO) 20 MG  TABS tablet Take 1 tablet (20 mg total) by mouth daily with supper. Please keep upcoming telemedicine appointment in June. Thank you. 06/06/21   Sueanne Margarita, MD  senna-docusate (SENOKOT-S) 8.6-50 MG tablet Take 1 tablet by mouth 2 (two) times daily. 05/24/21 06/23/21  Shahmehdi, Valeria Batman, MD  tadalafil (CIALIS) 5 MG tablet Take 5 mg by mouth every evening.    [provider]  tolterodine (DETROL LA) 4 MG 24 hr capsule Take 4 mg by mouth every evening. 04/10/21   [provider]  traMADol (ULTRAM) 50 MG tablet Take 50 mg by mouth 3 (three)  times daily as needed (pain).    [provider]      Allergies    Sulfa antibiotics    Review of Systems   Review of Systems  Unable to perform ROS: Mental status change   Physical Exam Updated Vital Signs BP 132/88   Pulse (!) 110   Temp 98.3 F (36.8 C)   Resp (!) 21   Ht '5\' 9"'$  (1.753 m)   Wt 117.9 kg   SpO2 97%   BMI 38.40 kg/m  Physical Exam Vitals and nursing note reviewed.  Constitutional:      General: He is not in acute distress.    Appearance: He is well-developed.     Comments: No acute distress but chronically ill-appearing  HENT:     Head: Normocephalic and atraumatic.  Eyes:     Conjunctiva/sclera: Conjunctivae normal.  Cardiovascular:     Rate and Rhythm: Tachycardia present. Rhythm irregular.     Heart sounds: No murmur heard. Pulmonary:     Effort: Pulmonary effort is normal. No respiratory distress.     Breath sounds: Normal breath sounds.  Abdominal:     Palpations: Abdomen is soft.     Tenderness: There is no abdominal tenderness.  Musculoskeletal:        General: No swelling.     Cervical back: Neck supple.  Skin:    General: Skin is warm and dry.     Capillary Refill: Capillary refill takes less than 2 seconds.  Neurological:     General: No focal deficit present.     Mental Status: He is alert.     Comments: Oriented to person place but not time  Psychiatric:        Mood and Affect: Mood normal.    ED Results / Procedures / Treatments   Labs (all labs ordered are listed, but only abnormal results are displayed) Labs Reviewed  COMPREHENSIVE METABOLIC PANEL - Abnormal; Notable for the following components:      Result Value   Glucose, Bld 136 (*)    Calcium 8.7 (*)    Total Protein 6.4 (*)    Albumin 3.2 (*)    Total Bilirubin 1.3 (*)    All other components within normal limits  URINALYSIS, ROUTINE W REFLEX MICROSCOPIC - Abnormal; Notable for the following components:   Color, Urine AMBER (*)    APPearance TURBID (*)     Hgb urine dipstick SMALL (*)    Ketones, ur 5 (*)    Protein, ur 100 (*)    Nitrite POSITIVE (*)    Leukocytes,Ua MODERATE (*)    RBC / HPF >50 (*)    WBC, UA >50 (*)    Bacteria, UA MANY (*)    Non Squamous Epithelial 0-5 (*)    All other components within normal limits  LACTIC ACID, PLASMA  CBC WITH DIFFERENTIAL/PLATELET  TROPONIN  I (HIGH SENSITIVITY)  TROPONIN I (HIGH SENSITIVITY)    EKG EKG Interpretation  Date/Time:  Wednesday Jun 12 2021 13:53:36 EDT Ventricular Rate:  126 PR Interval:    QRS Duration: 107 QT Interval:  315 QTC Calculation: 440 R Axis:   -16 Text Interpretation: Atrial fibrillation Ventricular bigeminy Borderline left axis deviation Low voltage, precordial leads Probable anteroseptal infarct, old Minimal ST depression, inferior leads Artifact in lead(s) I III aVL Confirmed by Madalyn Rob [phone]) on 06/12/2021 2:12:11 PM  Radiology CT Head Wo Contrast  Result Date: 06/12/2021 CLINICAL DATA:  Delirium EXAM: CT HEAD WITHOUT CONTRAST TECHNIQUE: Contiguous axial images were obtained from the base of the skull through the vertex without intravenous contrast. RADIATION DOSE REDUCTION: This exam was performed according to the departmental dose-optimization program which includes automated exposure control, adjustment of the mA and/or kV according to patient size and/or use of iterative reconstruction technique. COMPARISON:  None Available. FINDINGS: Brain: No evidence of acute infarction, hemorrhage, hydrocephalus, extra-axial collection or mass lesion/mass effect. Mild for age patchy white matter hypodensities, nonspecific but compatible with chronic microvascular ischemic disease. Vascular: No hyperdense vessel identified. Skull: No acute fracture. Sinuses/Orbits: Mild paranasal sinus mucosal thickening. No acute orbital findings. Other: No mastoid effusions. IMPRESSION: No evidence of acute intracranial abnormality. Electronically Signed   By: Margaretha Sheffield  M.D.   On: 06/12/2021 15:56   DG Chest Portable 1 View  Result Date: 06/12/2021 CLINICAL DATA:  Weakness, recent hospitalization for urine retention. EXAM: PORTABLE CHEST 1 VIEW COMPARISON:  Radiograph May 21, 2021 FINDINGS: Similar cardiomegaly. No overt pulmonary edema or focal airspace consolidation. No visible pleural effusion or pneumothorax. No acute osseous abnormality. IMPRESSION: Cardiomegaly without overt pulmonary edema or focal airspace consolidation. Electronically Signed   By: Dahlia Bailiff M.D.   On: 06/12/2021 15:01    Procedures Procedures    Medications Ordered in ED Medications  lactated ringers bolus 1,000 mL (1,000 mLs Intravenous New Bag/Given 06/12/21 1436)  cefTRIAXone (ROCEPHIN) 2 g in sodium chloride 0.9 % 100 mL IVPB (0 g Intravenous Stopped 06/12/21 1542)    ED Course/ Medical Decision Making/ A&P                           Medical Decision Making Amount and/or Complexity of Data Reviewed Labs: ordered. Radiology: ordered.  Risk Decision regarding hospitalization.   71 y/o male with Parkinson's disease, paroxysmal A-fib on  xarelto, CAD s/p PCI to RCA, HTN, HLD, OSA on bipap, BPH presented for generalized weakness, confusion.  On exam patient not in distress but is somewhat chronically ill-appearing.  Vitals noted some tachycardia but otherwise grossly stable.  Afebrile.  Foley catheter replaced, urine sample from new Foley catheter placement does appear to have infection.  This is possible source of his generalized weakness and increased confusion today.  Gave dose of IV ceftriaxone.  Gave fluids given the tachycardia, possible dehydration.  No significant electrolyte derangement and kidney function is normal.  CT head is pending.  I have discussed the case with Dr. Inda Merlin who will admit for further management.        Final Clinical Impression(s) / ED Diagnoses Final diagnoses:  Lower urinary tract infectious disease  Weakness  Confusion    Rx /  DC Orders ED Discharge Orders     None         Lucrezia Starch, MD 06/12/21 1610

## 2021-06-12 NOTE — Progress Notes (Signed)
Pharmacy Antibiotic Note  HALL BIRCHARD is a 71 y.o. male admitted on 06/12/2021 with UTI.  Pharmacy has been consulted for Cefepime dosing.  Plan: Cefepime 2 g IV q12h Ceftriaxone 2 g IV x 1 per MD Follow-up clinical status, renal function Follow-up cultures, LOT, de-escalate as able  Height: '5\' 9"'$  (175.3 cm) Weight: 117.9 kg (260 lb) IBW/kg (Calculated) : 70.7  Temp (24hrs), Avg:98 F (36.7 C), Min:97.6 F (36.4 C), Max:98.3 F (36.8 C)  Recent Labs  Lab 06/12/21 1405 06/12/21 1407  WBC 8.6  --   CREATININE 0.73  --   LATICACIDVEN  --  1.6    Estimated Creatinine Clearance: 108.9 mL/min (by C-G formula based on SCr of 0.73 mg/dL).    Allergies  Allergen Reactions   Sulfa Antibiotics Hives    Antimicrobials this admission: Cefepime 5/24 >>  Ceftriaxone 5/24 x 1  Microbiology results: 5/24 UCx: pending    Thank you for allowing pharmacy to be a part of this patient's care.  Vance Peper, PharmD PGY1 Pharmacy Resident 06/12/2021 5:35 PM   Please check AMION for all [place] phone numbers After 10:00 PM, call [place] [phone]

## 2021-06-12 NOTE — H&P (Signed)
History and Physical    Patient: Isaiah Pena IRJ:[id number] DOB: 07-16-1950 DOA: 06/12/2021 DOS: the patient was seen and examined on 06/12/2021 PCP: Mayra Neer, MD  Patient coming from: Home - lives with wife; NOK: Wife, [phone]   Chief Complaint: Weakness  HPI: Isaiah Pena is a 71 y.o. male with medical history significant of PAF on Xarelto; CAD s/p stent; HTN; HLD; OSA on BIPAP; BPH; and Parkinson's presenting with generalized weakness.  He was last admitted from 5/2-6 with AKI associated with acute urinary retention and had a foley placed.   He was subsequently admitted to CIR from 5/6-17 for debility.  He had routine BMs after coming home the first few days.  Using stimulant and Miralax to keep things going.  Urinary retention was new - they were previously changing Depends 4-6 times a night.  They saw Dr. Diona Fanti on 5/18 and he had fecal incontinence - he felt the BM coming on and he couldn't stop it.  They saw the urologist and had a voiding trial, which he failed.  He had another BM there.  The foley was replaced, stopped tolterodine.  On 5/20, she was concerned about his tubing because there was a lot of sediment in it.  He was getting somewhat harder to get to the bathroom.  No BMs since yesterday AM - water-like consistency, but less frequent since his fecal incontinence episodes.  He feels fecal urgency but it is mostly gas.  He was unable to stand with assistance last night and he was starting to get more confused and saying strange things.  He complained that his butt and penis burned.  Temp yesterday was 99.1.  They called EMS this AM.  They had a big family meeting on 5/22.  He had declined very quickly.  She is unable to care for him at home and this is too much for her.  They are in agreement that he will need SNF rehab following this admission.  They are open to palliative care consultation.    ER Course:  Parkinson's, recently admitted for AKI with urinary  retention.  Failed voiding trial, home with foley.  Due for urology f/u tomorrow.  Increasingly weak and confused today.  Non-focal exam.  Likely UTI - exchanged foley and likely with infection.  HR as high as 120.  BP stable.  Giving IVF.  Given Rocephin.     Review of Systems: As mentioned in the history of present illness. All other systems reviewed and are negative. Past Medical History:  Diagnosis Date   Anticoagulant long-term use    xarelto   Bilateral lower extremity edema    BPH (benign prostatic hyperplasia)    BPH (benign prostatic hyperplasia)    Central serous retinopathy    LOSS OF CENTER VISION LEFT EYE W/ BLURRED VISION-  was treated  methotrexate by specialist at [facility] and released   Coronary artery disease cardiologist-  dr Tressia Miners turner   01/ 2004  abnormal cardiolite  s/p  cardiac cath w/ PCI and DES to dRCA   Edema of extremities    CHRONIC    Erectile dysfunction    GERD (gastroesophageal reflux disease)    History of colon polyps    2011 per pt benign   History of kidney stones    History of simple renal cyst    drained via radiology   History of torn meniscus of right knee    Hyperlipidemia    Hypertension    Kidney stone  HX OF STONE-PASSED    Lower urinary tract symptoms (LUTS)    OSA (obstructive sleep apnea) 09/29/2011   NPSG 2004:  AHI 25/hr  Rx with bilevel.      OSA treated with BiPAP    moderate per study 01-23-2002   Parkinson's disease [facility])    neurologist-  dr ed hill ([facility])   Permanent atrial fibrillation ([place])    Persistent atrial fibrillation ([place]) 11/08/2012   S/P drug eluting coronary stent placement    01/ 2004  x1 to dRCA   Past Surgical History:  Procedure Laterality Date   CARDIOVASCULAR STRESS TEST  05-07-2015   dr Tressia Miners turner   normal nuclear study w/ no ischemia/  normal LV function and wall motion , stress ef 55%   COLONOSCOPY WITH PROPOFOL  2011   CORONARY ANGIOPLASTY WITH STENT PLACEMENT  02-11-2002  dr  Daneen Schick   abnormal cardiolite--  PCI and DES to dRCA (99%),  pRCA luminal irregularities, mild to moderate LAD and CFX disease,  normal LVF   CYSTOSCOPY WITH INSERTION OF UROLIFT N/A 01/28/2016   Procedure: CYSTOSCOPY WITH INSERTION OF UROLIFT;  Surgeon: Franchot Gallo, MD;  Location: [facility];  Service: Urology;  Laterality: N/A;   HERNIA REPAIR     AGE 24   KNEE ARTHROSCOPY  03/21/2011   Procedure: ARTHROSCOPY KNEE;  Surgeon: Tobi Bastos, MD;  Location: [facility];  Service: Orthopedics;  Laterality: Right;   Social History:  reports that he has never smoked. He has never used smokeless tobacco. He reports that he does not drink alcohol and does not use drugs.  Allergies  Allergen Reactions   Sulfa Antibiotics Hives    History reviewed. No pertinent family history.  Prior to Admission medications   Medication Sig Start Date End Date Taking? Authorizing Provider  acetaminophen (TYLENOL) 650 MG CR tablet Take 2 tablets (1,300 mg total) by mouth every 8 (eight) hours as needed for pain. 06/05/21   Setzer, Edman Circle, PA-C  amantadine (SYMMETREL) 100 MG capsule Take 100 mg by mouth See admin instructions. Take one capsule (100 mg) by mouth daily at bedtime - 1am 03/14/21   [provider]  atorvastatin (LIPITOR) 10 MG tablet TAKE 1/2 TABLET BY MOUTH EVERY OTHER DAY AND ALTERNATING 1 TABLET BY MOUTH EVERY OTHER DAY. Please keep upcoming appt in April 2023 before anymore refills. Thank you Patient taking differently: Take 5-10 mg by mouth See admin instructions. Take 1/2 tablet (5 mg) by mouth every other evening, alternate with 1 tablet (10 mg) every other evening. Please keep upcoming appt in April 2023 before anymore refills. Thank you 02/05/21   Sueanne Margarita, MD  carbidopa-levodopa (SINEMET IR) 25-100 MG tablet Take 3 tablets by mouth every 8 (eight) hours. 8am, 5pm and 1am 05/03/16   [provider]  clonazePAM (KLONOPIN) 0.5 MG tablet Take 0.25 mg by  mouth 2 (two) times daily as needed for anxiety. 05/01/21   [provider]  Docusate Sodium (COLACE PO) Take 2 tablets by mouth 2 (two) times daily as needed (constipation).    [provider]  entacapone (COMTAN) 200 MG tablet Take 200 mg by mouth every 8 (eight) hours. 9am, 5pm, 1am 09/16/14   [provider]  esomeprazole (NEXIUM) 20 MG capsule Take 20 mg by mouth every other day.    [provider]  finasteride (PROSCAR) 5 MG tablet TAKE 1 TABLET(5 MG) BY MOUTH DAILY 06/05/21   Setzer, Edman Circle, PA-C  magnesium gluconate (MAGONATE) 500 MG tablet Take 1 tablet (500 mg total) by mouth 2 (two) times daily. 06/05/21   Setzer, Edman Circle, PA-C  metoprolol (LOPRESSOR) 50 MG tablet Take 50 mg by mouth 2 (two) times daily.    [provider]  polyethylene glycol (MIRALAX / GLYCOLAX) 17 g packet Take 17 g by mouth daily. 05/25/21   Shahmehdi, Valeria Batman, MD  potassium chloride SA (KLOR-CON M) 20 MEQ tablet Take 1 tablet (20 mEq total) by mouth every other day. 06/05/21   Setzer, Edman Circle, PA-C  rivaroxaban (XARELTO) 20 MG TABS tablet Take 1 tablet (20 mg total) by mouth daily with supper. Please keep upcoming telemedicine appointment in June. Thank you. 06/06/21   Sueanne Margarita, MD  senna-docusate (SENOKOT-S) 8.6-50 MG tablet Take 1 tablet by mouth 2 (two) times daily. 05/24/21 06/23/21  Shahmehdi, Valeria Batman, MD  tadalafil (CIALIS) 5 MG tablet Take 5 mg by mouth every evening.    [provider]  tolterodine (DETROL LA) 4 MG 24 hr capsule Take 4 mg by mouth every evening. 04/10/21   [provider]  traMADol (ULTRAM) 50 MG tablet Take 50 mg by mouth 3 (three) times daily as needed (pain).    [provider]    Physical Exam: Vitals:   06/12/21 1530 06/12/21 1600 06/12/21 1626 06/12/21 1630  BP: 132/88 (!) 154/107  (!) 156/90  Pulse: (!) 110 (!) 130  (!) 119  Resp: (!) 21 (!) 21  20  Temp:   97.6 F (36.4 C)   TempSrc:   Oral   SpO2: 97% 97%   98%  Weight:      Height:       General:  Appears frail, chronically ill, and somewhat disoriented Eyes:  PERRL, EOMI, normal lids, iris ENT:  grossly normal hearing, lips & tongue, mmm Neck:  no LAD, masses or thyromegaly Cardiovascular:  Irregularly irregular with tachycardia, no m/r/g. 2+ chronic LE edema.  Respiratory:   CTA bilaterally with no wheezes/rales/rhonchi.  Mildly increased respiratory effort. Abdomen:  soft, NT, ND; foley in place Skin:  no rash or induration seen on limited exam Musculoskeletal:  grossly normal tone BUE/BLE, no bony abnormality Psychiatric:  flat/somnolent mood and affect, speech sparse but appropriate, AOx3 Neurologic:  CN 2-12 grossly intact, moves all extremities in coordinated fashion   Radiological Exams on Admission: Independently reviewed - see discussion in A/P where applicable  CT Head Wo Contrast  Result Date: 06/12/2021 CLINICAL DATA:  Delirium EXAM: CT HEAD WITHOUT CONTRAST TECHNIQUE: Contiguous axial images were obtained from the base of the skull through the vertex without intravenous contrast. RADIATION DOSE REDUCTION: This exam was performed according to the departmental dose-optimization program which includes automated exposure control, adjustment of the mA and/or kV according to patient size and/or use of iterative reconstruction technique. COMPARISON:  None Available. FINDINGS: Brain: No evidence of acute infarction, hemorrhage, hydrocephalus, extra-axial collection or mass lesion/mass effect. Mild for age patchy white matter hypodensities, nonspecific but compatible with chronic microvascular ischemic disease. Vascular: No hyperdense vessel identified. Skull: No acute fracture. Sinuses/Orbits: Mild paranasal sinus mucosal thickening. No acute orbital findings. Other: No mastoid effusions. IMPRESSION: No evidence of acute intracranial abnormality. Electronically Signed   By: Margaretha Sheffield M.D.   On: 06/12/2021 15:56   DG Chest Portable  1 View  Result Date: 06/12/2021 CLINICAL DATA:  Weakness, recent hospitalization for urine retention. EXAM: PORTABLE CHEST 1 VIEW COMPARISON:  Radiograph May 21, 2021 FINDINGS: Similar cardiomegaly. No  overt pulmonary edema or focal airspace consolidation. No visible pleural effusion or pneumothorax. No acute osseous abnormality. IMPRESSION: Cardiomegaly without overt pulmonary edema or focal airspace consolidation. Electronically Signed   By: Dahlia Bailiff M.D.   On: 06/12/2021 15:01    EKG: Independently reviewed.  Afib with rate 126; nonspecific ST changes with no evidence of acute ischemia   Labs on Admission: I have personally reviewed the available labs and imaging studies at the time of the admission.  Pertinent labs:    Glucose 136 Albumin 3.2 Bili 1.3 HS troponin 5 Lactate 1.6 Normal CBC UA: small Hgb, 5 ketones, moderate LE, +nitrite, 100 protein, many bacteria, >50 RBC/WBC   Assessment and Plan: Principal Problem:   Complicated UTI (urinary tract infection) Active Problems:   Urinary retention with incomplete bladder emptying   Constipation   Persistent atrial fibrillation (HCC)   Parkinson disease ([place])   Debility   Hypertension   Hyperlipidemia   OSA (obstructive sleep apnea)    UTI -Patient with recent admission for AKI associated with urinary retention  -Now presenting with AMS and generalized weakness -UA is grossly infected -Will admit for treatment of complicated UTI, possibly hospital-associated -Treat with Cefepime  Urinary retention  -Previously admitted with AKI secondary to obstructive pathology from acute urinary retention in setting of PD -Foley catheter was placed and he subsequently failed voiding trial -He was due to f/u again this week (5/26) with Dr. Diona Fanti but that appointment will need to be rescheduled -Will leave foley in place for now -Continue finasteride -Tolterodine has been discontinued (by Dr.  Diona Fanti)  Constipation -Diarrhea today, thought to be overflow incontinence -Complicated by Parkison's and dysautonomia -Continue good bowel regimen including disimpaction as needed     Persistent atrial fibrillation (HCC) -Suboptimal rate control in the ER -Will continue Lopressor and give the evening dose early -Continue Xarelto   Parkinson's disease with debililty -He is having more difficulty with fecal incontinence - this may be due to overflow incontinence or may be indicative of Parkinson's progression -Also with urinary retention, concerning as above for progressive Parkinson's -He was sent to CIR following his last hospitalization and yet is readmitted a week later - appearing to have failed home trial -He appears likely to need SNF rehab following this admission if not long-term -Continue home Entacapone, Sinemet IR and Amantadine -PT/OT/ST (cognitive and language) consults -TOC consult for probable placement -Delirium precautions -Continue Klonopin as needed for anxiety   Hypertension -Continue metoprolol    Hyperlipidemia -Continue Lipitor   OSA -Not on CPAP  DNR -I have discussed code status with the patient's family and the patient would not desire resuscitation and would prefer to die a natural death should that situation arise. -He will need a gold out of facility DNR form at the time of discharge  -They are also interested in further discussion of goals of care; palliative care consulted    Advance Care Planning:   Code Status: DNR   Consults: Palliative care; TOC team; PT/OT/ST; Nutrition  DVT Prophylaxis: Xarelto  Family Communication: His wife was present throughout the evaluation and his son came at the conclusion of the visit  Severity of Illness: The appropriate patient status for this patient is INPATIENT. Inpatient status is judged to be reasonable and necessary in order to provide the required intensity of service to ensure the patient's  safety. The patient's presenting symptoms, physical exam findings, and initial radiographic and laboratory data in the context of their chronic comorbidities is felt  to place them at high risk for further clinical deterioration. Furthermore, it is not anticipated that the patient will be medically stable for discharge from the hospital within 2 midnights of admission.   * I certify that at the point of admission it is my clinical judgment that the patient will require inpatient hospital care spanning beyond 2 midnights from the point of admission due to high intensity of service, high risk for further deterioration and high frequency of surveillance required.*  Author: Karmen Bongo, MD 06/12/2021 5:21 PM  For on call review [email or web address].

## 2021-06-12 NOTE — ED Notes (Signed)
Patient transported to CT 

## 2021-06-12 NOTE — ED Triage Notes (Signed)
Per [facility] pt coming from home- was recently in hospital for urinary retention. C/o decreased mobility over the past 48 hours. Patient c/o chronic lower back pain. Patient alert to self, location and situation but unsure of date.

## 2021-06-13 ENCOUNTER — Encounter (HOSPITAL_COMMUNITY): Payer: Self-pay | Admitting: Internal Medicine

## 2021-06-13 DIAGNOSIS — R339 Retention of urine, unspecified: Secondary | ICD-10-CM | POA: Diagnosis not present

## 2021-06-13 DIAGNOSIS — N390 Urinary tract infection, site not specified: Secondary | ICD-10-CM | POA: Diagnosis not present

## 2021-06-13 DIAGNOSIS — G20 Parkinson's disease: Secondary | ICD-10-CM

## 2021-06-13 DIAGNOSIS — Z515 Encounter for palliative care: Secondary | ICD-10-CM

## 2021-06-13 DIAGNOSIS — Z7189 Other specified counseling: Secondary | ICD-10-CM | POA: Diagnosis not present

## 2021-06-13 DIAGNOSIS — I4819 Other persistent atrial fibrillation: Secondary | ICD-10-CM | POA: Diagnosis not present

## 2021-06-13 LAB — BASIC METABOLIC PANEL
Anion gap: 5 (ref 5–15)
BUN: 7 mg/dL — ABNORMAL LOW (ref 8–23)
CO2: 27 mmol/L (ref 22–32)
Calcium: 8.1 mg/dL — ABNORMAL LOW (ref 8.9–10.3)
Chloride: 104 mmol/L (ref 98–111)
Creatinine, Ser: 0.65 mg/dL (ref 0.61–1.24)
GFR, Estimated: >60 mL/min (ref >60)
Glucose, Bld: 119 mg/dL — ABNORMAL HIGH (ref 70–99)
Potassium: 3.2 mmol/L — ABNORMAL LOW (ref 3.5–5.1)
Sodium: 136 mmol/L (ref 135–145)

## 2021-06-13 LAB — GC/CHLAMYDIA PROBE AMP (~~LOC~~) NOT AT ARMC
Chlamydia: NEGATIVE
Comment: NEGATIVE
Comment: NORMAL
Neisseria Gonorrhea: NEGATIVE

## 2021-06-13 LAB — CBC
HCT: 40 % (ref 39.0–52.0)
Hemoglobin: 14 g/dL (ref 13.0–17.0)
MCH: 33.7 pg (ref 26.0–34.0)
MCHC: 35 g/dL (ref 30.0–36.0)
MCV: 96.4 fL (ref 80.0–100.0)
Platelets: 143 10*3/uL — ABNORMAL LOW (ref 150–400)
RBC: 4.15 MIL/uL — ABNORMAL LOW (ref 4.22–5.81)
RDW: 13.1 % (ref 11.5–15.5)
WBC: 8.1 10*3/uL (ref 4.0–10.5)
nRBC: 0 % (ref 0.0–0.2)

## 2021-06-13 LAB — GLUCOSE, CAPILLARY: Glucose-Capillary: 142 mg/dL — ABNORMAL HIGH (ref 70–99)

## 2021-06-13 LAB — PROCALCITONIN: Procalcitonin: <0.1 ng/mL

## 2021-06-13 MED ORDER — POTASSIUM CHLORIDE CRYS ER 20 MEQ PO TBCR
40.0000 meq | EXTENDED_RELEASE_TABLET | Freq: Once | ORAL | Status: AC
  Administered 2021-06-13: 40 meq via ORAL
  Filled 2021-06-13: qty 2

## 2021-06-13 MED ORDER — PANTOPRAZOLE SODIUM 40 MG PO TBEC
40.0000 mg | DELAYED_RELEASE_TABLET | Freq: Every day | ORAL | Status: DC
  Administered 2021-06-13 – 2021-06-17 (×5): 40 mg via ORAL
  Filled 2021-06-13 (×5): qty 1

## 2021-06-13 MED ORDER — CHLORHEXIDINE GLUCONATE CLOTH 2 % EX PADS
6.0000 | MEDICATED_PAD | Freq: Every day | CUTANEOUS | Status: DC
  Administered 2021-06-13 – 2021-06-17 (×5): 6 via TOPICAL

## 2021-06-13 MED ORDER — EZETIMIBE 10 MG PO TABS
10.0000 mg | ORAL_TABLET | Freq: Every day | ORAL | Status: DC
  Administered 2021-06-13 – 2021-06-17 (×5): 10 mg via ORAL
  Filled 2021-06-13 (×6): qty 1

## 2021-06-13 NOTE — Evaluation (Signed)
Occupational Therapy Evaluation Patient Details Name: Isaiah Pena MRN: [id number] DOB: 01/11/1951 Today's Date: 06/13/2021   History of Present Illness Isaiah Pena is a 71 y.o. male admitted with confusion and treatment of complicated UTI,with medical history CAD s/p stent; HTN; HLD; OSA on BIPAP; BPH; and Parkinson's presenting with generalized weakness.  He was last admitted from 5/2-6 with AKI associated with acute urinary retention and had a foley placed.   He was subsequently admitted to CIR from 5/6-17 for debility.   Clinical Impression   Pt currently at a max + 2 assist level for supine to sit with mod +2 for transfers to the [facility].  Parkinson's tremor and short step length/shuffling gait pattern noted.  Overall mod to max assist for simulated selfcare tasks based on current functional movements.  Feel he will benefit from acute care OT to to work on increasing ADL performance to an overall min assist level.  Would recommend SNF for follow-up rehab to help achieve this before discharging home with his spouse.        Recommendations for follow up therapy are one component of a multi-disciplinary discharge planning process, led by the attending physician.  Recommendations may be updated based on patient status, additional functional criteria and insurance authorization.   Follow Up Recommendations  Skilled nursing-short term rehab (<3 hours/day)    Assistance Recommended at Discharge Frequent or constant Supervision/Assistance  Patient can return home with the following Assistance with cooking/housework;Direct supervision/assist for medications management;Direct supervision/assist for financial management;Assist for transportation;Help with stairs or ramp for entrance;A lot of help with walking and/or transfers;A lot of help with bathing/dressing/bathroom    Functional Status Assessment  Patient has had a recent decline in their functional status and demonstrates the ability to make  significant improvements in function in a reasonable and predictable amount of time.  Equipment Recommendations  Other (comment) (deferred to next venue of care)       Precautions / Restrictions Precautions Precautions: Fall Restrictions Weight Bearing Restrictions: No      Mobility Bed Mobility Overal bed mobility: Needs Assistance Bed Mobility: Supine to Sit, Sit to Supine     Supine to sit: Max assist, +2 for physical assistance Sit to supine: Max assist, +2 for physical assistance   General bed mobility comments: Increased time and assist with both trunk and LEs off and on the bed during both transitions.    Transfers Overall transfer level: Needs assistance Equipment used: Rolling walker (2 wheels) Transfers: Sit to/from Stand, Bed to chair/wheelchair/BSC Sit to Stand: Mod assist, +2 physical assistance (Mod +2 for sit to stand from the bed and without an assistive device.  Min assist for sit to stand from [facility] with use of the RW.)     Step pivot transfers: Mod assist, +2 physical assistance     General transfer comment: Pt with short shuffled steps when transferring with and without the RW.      Balance Overall balance assessment: Needs assistance Sitting-balance support: Bilateral upper extremity supported, Feet supported Sitting balance-Leahy Scale: Fair     Standing balance support: Bilateral upper extremity supported, Reliant on assistive device for balance, During functional activity Standing balance-Leahy Scale: Poor Standing balance comment: Pt needs BUE support in standing with posterior bias noted during transfer.                           ADL either performed or assessed with clinical judgement   ADL Overall  ADL's : Needs assistance/impaired     Grooming: Wash/dry face;Set up               Lower Body Dressing: Maximal assistance;+2 for physical assistance;Sit to/from stand Lower Body Dressing Details (indicate cue type and  reason): simulated Toilet Transfer: +2 for physical assistance;Moderate assistance Toilet Transfer Details (indicate cue type and reason): Pt able to transfer onto the toilet with mod +2 without the RW and then only min assist with use of the RW back to the bed. Toileting- Clothing Manipulation and Hygiene: +2 for physical assistance;Maximal assistance       Functional mobility during ADLs: Minimal assistance;+2 for physical assistance;Rolling walker (2 wheels) General ADL Comments: Pt in bed to start reluctant to participate "I'm not getting OOB".  He agreed to transfer to the [facility] secondary to feeling like he needed to have a BM.  Max +2 for supine to sit with min guard for static sitting balance once on the EOB.  He needed mod +2 for stand pivot to the [facility] with min assist to transfer back to the EOB with use of the RW.  Min instructional cueing needed for aligning himself up to the surface and stepping all the way back.  Shuffling gait pattern with short step length noted.     Vision Baseline Vision/History: 1 Wears glasses Ability to See in Adequate Light: 0 Adequate Patient Visual Report: No change from baseline Vision Assessment?: No apparent visual deficits     Perception Perception Perception: Within Functional Limits   Praxis Praxis Praxis: Intact    Pertinent Vitals/Pain Pain Assessment Pain Assessment: Faces Faces Pain Scale: No hurt     Hand Dominance Right   Extremity/Trunk Assessment Upper Extremity Assessment Upper Extremity Assessment: Generalized weakness (BUE shoulder flexion 0-110 degrees with strength overall 4/5.  Noted tremor bilaterally secondary to Parkinson's)   Lower Extremity Assessment Lower Extremity Assessment: Defer to PT evaluation   Cervical / Trunk Assessment Cervical / Trunk Assessment: Kyphotic   Communication Communication Communication: Expressive difficulties   Cognition Arousal/Alertness: Awake/alert Behavior During Therapy: Flat  affect Overall Cognitive Status: Within Functional Limits for tasks assessed Area of Impairment: Problem solving, Awareness                       Following Commands: Follows one step commands consistently   Awareness: Emergent   General Comments: He was able to participate in therapy session, answering questions appropriately.  Flat affect with decreased motivation to get up inially but this improved once he got moving.                Home Living Family/patient expects to be discharged to:: Skilled nursing facility   Available Help at Discharge: Family;Available 24 hours/day Type of Home: House Home Access: Stairs to enter [employer] of Steps: 2 steps in front; 4 steps in back Entrance Stairs-Rails: Left (on back steps; no hand rails on front steps) Home Layout: One level     Bathroom Shower/Tub: Occupational psychologist: Handicapped height   How Accessible: Accessible via walker Home Equipment: Rollator (4 wheels);Cane - single point;Shower seat   Additional Comments: very supportive family/community  Lives With: Spouse    Prior Functioning/Environment Prior Level of Function : Needs assist       Physical Assist : Mobility (physical);ADLs (physical) Mobility (physical): Bed mobility;Stairs;Transfers ADLs (physical): Bathing;Toileting;Dressing;IADLs Mobility Comments: wife, Adonis Brook, helps him with bed mobility and to stand up out of bed. Pt able  to ambulate around house without assist, however, does require assist on stairs for safety. ADLs Comments: pt needs appox 50% assist for ADLs including dressing, bathing        OT Problem List: Decreased activity tolerance;Impaired balance (sitting and/or standing);Decreased coordination;Decreased strength      OT Treatment/Interventions: Self-care/ADL training;Therapeutic exercise;DME and/or AE instruction;Cognitive remediation/compensation;Therapeutic activities;Patient/family  education;Balance training;Energy conservation;Neuromuscular education    OT Goals(Current goals can be found in the care plan section) Acute Rehab OT Goals Patient Stated Goal: To get back to his functional baseline OT Goal Formulation: With patient/family Time For Goal Achievement: 06/27/21 Potential to Achieve Goals: Good  OT Frequency: Min 2X/week    Co-evaluation PT/OT/SLP Co-Evaluation/Treatment: Yes     OT goals addressed during session: ADL's and self-care      AM-PAC OT "6 Clicks" Daily Activity     Outcome Measure Help from another person eating meals?: A Little Help from another person taking care of personal grooming?: A Little Help from another person toileting, which includes using toliet, bedpan, or urinal?: A Lot Help from another person bathing (including washing, rinsing, drying)?: A Lot Help from another person to put on and taking off regular upper body clothing?: A Lot Help from another person to put on and taking off regular lower body clothing?: A Lot 6 Click Score: 14   End of Session Equipment Utilized During Treatment: Rolling walker (2 wheels);Other (comment) [facility]) Nurse Communication: Mobility status  Activity Tolerance: Patient tolerated treatment well Patient left: in bed;with call bell/phone within reach;with family/visitor present  OT Visit Diagnosis: Other abnormalities of gait and mobility (R26.89);Muscle weakness (generalized) (M62.81);Other symptoms and signs involving cognitive function                Time: 5361-4431 OT Time Calculation (min): 38 min Charges:  OT General Charges $OT Visit: 1 Visit OT Evaluation $OT Eval Moderate Complexity: 1 Mod OT Treatments $Self Care/Home Management : 8-22 mins Sharnae Winfree OTR/L 06/13/2021, 9:36 AM

## 2021-06-13 NOTE — Evaluation (Signed)
Physical Therapy Evaluation Patient Details Name: Isaiah Pena MRN: [id number] DOB: 1950-02-11 Today's Date: 06/13/2021  History of Present Illness  71 y/o male presented to ED on 06/12/21 for generalized weakness. Recent admission 5/2-6 for AKI with subsequent admission 5/6-17 at [facility] for debility. Admitted for complicated UTI and AKI with urinary retention. PMH: Parkinson's, CAD s/p stent, HTN, OSA  Clinical Impression  Patient admitted with the above. PTA, patient requiring more assistance from wife for mobility due to progressive weakness and confusion since discharge from CIR on 5/17. Patient presents with weakness, decreased activity tolerance, impaired balance, and impaired functional mobility. Patient requires maxA+2 for bed mobility and up to modA+2 for sit to stand and step pivot transfer with RW. Patient self limiting and requires encouragement to participate with therapy. Patient will benefit from skilled PT services during acute stay to address listed deficits. Recommend SNF at discharge to maximize functional mobility and decreased caregiver burden.      Recommendations for follow up therapy are one component of a multi-disciplinary discharge planning process, led by the attending physician.  Recommendations may be updated based on patient status, additional functional criteria and insurance authorization.  Follow Up Recommendations Skilled nursing-short term rehab (<3 hours/day)    Assistance Recommended at Discharge Frequent or constant Supervision/Assistance  Patient can return home with the following  A lot of help with walking and/or transfers;Assistance with cooking/housework;Direct supervision/assist for medications management;Direct supervision/assist for financial management;Assist for transportation;Help with stairs or ramp for entrance;A lot of help with bathing/dressing/bathroom    Equipment Recommendations BSC/3in1;Rolling Shaquasia Caponigro (2 wheels);Wheelchair (measurements  PT);Wheelchair cushion (measurements PT)  Recommendations for Other Services       Functional Status Assessment Patient has had a recent decline in their functional status and demonstrates the ability to make significant improvements in function in a reasonable and predictable amount of time.     Precautions / Restrictions Precautions Precautions: Fall Restrictions Weight Bearing Restrictions: No      Mobility  Bed Mobility Overal bed mobility: Needs Assistance Bed Mobility: Supine to Sit, Sit to Supine     Supine to sit: Max assist, +2 for physical assistance Sit to supine: Max assist, +2 for physical assistance   General bed mobility comments: Increased time and assist with both trunk and LEs off and on the bed during both transitions.    Transfers Overall transfer level: Needs assistance Equipment used: Rolling Keval Nam (2 wheels) Transfers: Sit to/from Stand, Bed to chair/wheelchair/BSC Sit to Stand: Mod assist, +2 physical assistance, +2 safety/equipment, Min assist   Step pivot transfers: Mod assist, +2 physical assistance       General transfer comment: Pt with short shuffled steps when transferring with and without the RW. Mod +2 for sit to stand from the bed and without an assistive device. Min assist for sit to stand from [facility] with use of the RW.    Ambulation/Gait                  Stairs            Wheelchair Mobility    Modified Rankin (Stroke Patients Only)       Balance Overall balance assessment: Needs assistance Sitting-balance support: Bilateral upper extremity supported, Feet supported Sitting balance-Leahy Scale: Fair     Standing balance support: Bilateral upper extremity supported, Reliant on assistive device for balance, During functional activity Standing balance-Leahy Scale: Poor Standing balance comment: Pt needs BUE support in standing with posterior bias noted during transfer.  Pertinent Vitals/Pain Pain Assessment Pain Assessment: Faces Faces Pain Scale: No hurt Pain Intervention(s): Monitored during session    Home Living Family/patient expects to be discharged to:: Skilled nursing facility Living Arrangements: Spouse/significant other Available Help at Discharge: Family;Available 24 hours/day Type of Home: House Home Access: Stairs to enter Entrance Stairs-Rails: Left Entrance Stairs-Number of Steps: 2 steps in front; 4 steps in back   Home Layout: One level Home Equipment: Rollator (4 wheels);Cane - single point;Shower seat Additional Comments: very supportive family/community    Prior Function Prior Level of Function : Needs assist       Physical Assist : Mobility (physical);ADLs (physical) Mobility (physical): Bed mobility;Stairs;Transfers ADLs (physical): Bathing;Toileting;Dressing;IADLs Mobility Comments: recently requiring more assistance from wife for mobility. Discharged from CIR at supervision-min guard level. Wife states he was requiring maxA ADLs Comments: pt needs appox 50% assist for ADLs including dressing, bathing     Hand Dominance   Dominant Hand: Right    Extremity/Trunk Assessment   Upper Extremity Assessment Upper Extremity Assessment: Defer to OT evaluation    Lower Extremity Assessment Lower Extremity Assessment: Generalized weakness (grossly 2+-3-/5 bilatreally)    Cervical / Trunk Assessment Cervical / Trunk Assessment: Kyphotic  Communication   Communication: Expressive difficulties  Cognition Arousal/Alertness: Awake/alert Behavior During Therapy: Flat affect Overall Cognitive Status: Impaired/Different from baseline Area of Impairment: Problem solving, Awareness                           Awareness: Emergent Problem Solving: Slow processing, Decreased initiation, Difficulty sequencing, Requires verbal cues, Requires tactile cues General Comments: He was able to participate in therapy  session, answering questions appropriately.  Flat affect with decreased motivation to get up initially but this improved once he got moving.        General Comments      Exercises     Assessment/Plan    PT Assessment Patient needs continued PT services  PT Problem List Decreased strength;Decreased range of motion;Decreased balance;Decreased mobility;Decreased cognition;Decreased coordination       PT Treatment Interventions Gait training;DME instruction;Stair training;Functional mobility training;Therapeutic activities;Therapeutic exercise;Balance training;Neuromuscular re-education;Patient/family education    PT Goals (Current goals can be found in the Care Plan section)  Acute Rehab PT Goals Patient Stated Goal: to get stronger PT Goal Formulation: With patient/family Time For Goal Achievement: 06/27/21 Potential to Achieve Goals: Fair    Frequency Min 3X/week     Co-evaluation PT/OT/SLP Co-Evaluation/Treatment: Yes Reason for Co-Treatment: For patient/therapist safety;To address functional/ADL transfers PT goals addressed during session: Mobility/safety with mobility;Balance;Proper use of DME OT goals addressed during session: ADL's and self-care       AM-PAC PT "6 Clicks" Mobility  Outcome Measure Help needed turning from your back to your side while in a flat bed without using bedrails?: Total Help needed moving from lying on your back to sitting on the side of a flat bed without using bedrails?: Total Help needed moving to and from a bed to a chair (including a wheelchair)?: Total Help needed standing up from a chair using your arms (e.g., wheelchair or bedside chair)?: Total Help needed to walk in hospital room?: Total Help needed climbing 3-5 steps with a railing? : Total 6 Click Score: 6    End of Session   Activity Tolerance: Patient tolerated treatment well Patient left: in bed;with call bell/phone within reach;with family/visitor present Nurse  Communication: Mobility status PT Visit Diagnosis: Unsteadiness on feet (R26.81);Other abnormalities of gait and mobility (  R26.89);Muscle weakness (generalized) (M62.81);Difficulty in walking, not elsewhere classified (R26.2)    Time: 6333-5456 PT Time Calculation (min) (ACUTE ONLY): 38 min   Charges:   PT Evaluation $PT Eval Moderate Complexity: 1 Mod          Devan Babino A. Gilford Rile PT, DPT Acute Rehabilitation Services Pager [phone] Office [phone]   Linna Hoff 06/13/2021, 10:19 AM

## 2021-06-13 NOTE — TOC Progression Note (Signed)
Transition of Care [facility]) - Initial/Assessment Note    Patient Details  Name: Isaiah Pena MRN: [id number] Date of Birth: 12-03-1950  [facility]) CM/SW Contact:    Milinda Antis, [place] Phone Number: 06/13/2021, 9:06 AM  Clinical Narrative:                 CSW received SNF consult.  Patient's family is requesting SNF as they report being unable to care for the patient at home.  Patient's PASRR number is [id number].  Pending: PT/OT assessments and recommendations         Patient Goals and CMS Choice        Expected Discharge Plan and Services                                                Prior Living Arrangements/Services                       Activities of Daily Living Home Assistive Devices/Equipment: Gilford Rile (specify type) ADL Screening (condition at time of admission) Patient's cognitive ability adequate to safely complete daily activities?: No Is the patient deaf or have difficulty hearing?: No Does the patient have difficulty seeing, even when wearing glasses/contacts?: No Does the patient have difficulty concentrating, remembering, or making decisions?: No Patient able to express need for assistance with ADLs?: Yes Does the patient have difficulty dressing or bathing?: Yes Independently performs ADLs?: No Communication: Independent Dressing (OT): Needs assistance Is this a change from baseline?: Change from baseline, expected to last <3days Is this a change from baseline?: Change from baseline, expected to last <3 days Feeding: Independent Bathing: Needs assistance Is this a change from baseline?: Change from baseline, expected to last <3 days Toileting: Needs assistance Is this a change from baseline?: Change from baseline, expected to last <3 days In/Out Bed: Needs assistance Is this a change from baseline?: Change from baseline, expected to last <3 days Walks in Home: Needs assistance Is this a change from baseline?:  Change from baseline, expected to last <3 days Does the patient have difficulty walking or climbing stairs?: Yes Weakness of Legs: Both Weakness of Arms/Hands: Both  Permission Sought/Granted                  Emotional Assessment              Admission diagnosis:  Lower urinary tract infectious disease [N39.0] Confusion [R41.0] Weakness [K74.2] Complicated UTI (urinary tract infection) [N39.0] Patient Active Problem List   Diagnosis Date Noted   Complicated UTI (urinary tract infection) 06/12/2021   Urinary retention with incomplete bladder emptying 06/12/2021   Debility 05/24/2021   AKI with acute urinary retention  05/21/2021   Constipation 05/21/2021   Right renal mass 05/21/2021   Leukocytosis 05/21/2021   Parkinson disease ([place]) 05/21/2021   Persistent atrial fibrillation ([place]) 11/08/2012   Central serous retinopathy    Hypertension    GERD (gastroesophageal reflux disease)    Kidney stone    History of torn meniscus of right knee    Edema of extremities    Hyperlipidemia    BPH (benign prostatic hyperplasia)    Coronary artery disease s/p PCI to RCA     OSA (obstructive sleep apnea) 09/29/2011   PCP:  Mayra Neer, MD Pharmacy:   [facility] [address],  [place] - [address] [place] [address] Phone: [phone] Fax: [phone]  [place], [place] AT [facility] OF [address] [place] [address] Phone: [phone] Fax: [phone]     Social Determinants of Health (SDOH) Interventions    Readmission Risk Interventions     View : No data to display.

## 2021-06-13 NOTE — Progress Notes (Signed)
Responded to page to assist pt and son with AD.  Nurse is giving AD form to patient and will notify Chaplain when ready.  Jaclynn Major, [place], [facility], Pager [phone]

## 2021-06-13 NOTE — Progress Notes (Signed)
06/13/21 1750  Clinical Encounter Type  Visited With Health care provider;Patient and family together Lannette Donath M. Jeannetta Ellis, RN; Isaiah Pena)  Visit Type Initial  Referral From Nurse Lannette Donath M. Ratilla, RN)  Consult/Referral To Chaplain Albertina Parr Morene Crocker)   Chaplain responded to spiritual care consult request by his nurse Vernell Morgans. Jeannetta Ellis, RN for Advance Directive education with [name] "Isaiah Pena." Met with Isaiah Pena and his partner, Isaiah Pena at patient's bedside.   Chaplain provided the Advance Directive packet as well as education on Advance Directives-documents an individual completes to communicate their health care directions in advance of a time when they may need them. Chaplain informed Isaiah Pena the documents which may be completed here in the hospital are the Living Will and [place].   Chaplain informed patient that the [place] is a legal document in which an individual names another person, as their [place], to make health care decisions when the individual is not able to make them for themselves. The Health Care Agent's function can be temporary or permanent depending on his ability to make and communicate those decisions independently. Chaplain informed Isaiah Pena in the absence of a [place], the state of [place] directs health care providers to look to the following individuals in the order listed: legal guardian; an attorney-in-fact under a general power of attorney (POA) if that POA includes the right to make health care decisions; person's spouse; a 3 of his children; a 63 of adult brothers and sisters; or an individual who has an established relationship with you, who is acting in good faith and who can convey your wishes.  If none of these persons are available or willing to make medical decisions on a patient's behalf, the law allows the patient's doctor to make  decisions for them as long as another doctor agrees with those decisions.  Chaplain also informed the patient that the Health Care agent has no decision-making authority over any affairs other than those related to his medical care. Isaiah Pena is listing his son: Isaiah Pena; and his significant other: Isaiah Pena as his agents.   The chaplain further educated Isaiah Pena that a Living Will is a legal document that allows his desire not to receive life-prolonging measures in the event that they have a condition that is incurable and will result in his death in a short period of time; they are unconscious, and doctors are confident that they will not regain consciousness; and/or they have advanced dementia or other substantial and irreversible loss of mental function. Isaiah Pena indicated on his Living Will that he wanted to be allowed to die naturally without feeding tube.  The chaplain informed Isaiah Pena that life-prolonging measures are medical treatments that would only serve to postpone death, including breathing machines, kidney dialysis, antibiotics, artificial nutrition and hydration (tube feeding), and similar forms of treatment and that if an individual is able to express their wishes, they may also make them known without the use of a Living Will, but in the event that he is not able to express  wishes, a Living Will allows medical providers and the his family and friends to ensure that they are not making decisions on his behalf, but rather serving as the his voice to convey decisions the he has already made.   Isaiah Pena is aware that the decision to create an advance directive is his alone and he may choose  not to complete the documents or may choose to complete one portion or both.  Isaiah Pena was informed that he can revoke the documents at any time by striking through them and writing void or by completing new documents, but that it is also advisable that the individual verbally notify interested  parties that their wishes have changed.   Isaiah Pena is also aware that the document must be signed in the presence of a notary public and two witnesses and that this can be done while the patient is still admitted to the hospital or after discharge in the community. If they decide to complete Advance Directives after being discharged from the hospital, they have been advised to notify all interested parties and to provide those documents to their physicians and loved ones in addition to bringing them to the hospital in the event of another hospitalization.   The chaplain informed the Isaiah Pena that if he desires to proceed with completing Advance Directive Documentation while he is still admitted, notary services are typically available at [facility] between the hours of 1:00 and 3:30 Monday-Thursday.   When the patient is ready to have these documents completed, the patient should request that their nurse place a spiritual care consult and indicate that the patient is ready to have their advance directives notarized so that arrangements for witnesses and notary public can be made.   Please page spiritual care if the patient desires further education or has questions.  PATIENT'S DOCUMENTS ARE COMPLETED AND PREPARED FOR [facility]    Chaplain Melvenia Beam, M. Min., [phone]

## 2021-06-13 NOTE — NC FL2 (Signed)
[place] LEVEL OF CARE SCREENING TOOL     IDENTIFICATION  Patient Name: Isaiah Pena Birthdate: 1950-07-07 Sex: male Admission Date (Current Location): 06/12/2021  [facility] and [place] Number:  Herbalist and Address:  [facility] [place], [place] [address] [place], [place] [address]      Provider Number: [id number]  Attending Physician Name and Address:  Mariel Aloe, MD  Relative Name and Phone Number:  [name] (Significant other)   [phone]    Current Level of Care: Hospital Recommended Level of Care: [place] Prior Approval Number:    Date Approved/Denied:   PASRR Number: [id number]  Discharge Plan: SNF    Current Diagnoses: Patient Active Problem List   Diagnosis Date Noted   Complicated UTI (urinary tract infection) 06/12/2021   Urinary retention with incomplete bladder emptying 06/12/2021   Debility 05/24/2021   AKI with acute urinary retention  05/21/2021   Constipation 05/21/2021   Right renal mass 05/21/2021   Leukocytosis 05/21/2021   Parkinson disease ([place]) 05/21/2021   Persistent atrial fibrillation ([place]) 11/08/2012   Central serous retinopathy    Hypertension    GERD (gastroesophageal reflux disease)    Kidney stone    History of torn meniscus of right knee    Edema of extremities    Hyperlipidemia    BPH (benign prostatic hyperplasia)    Coronary artery disease s/p PCI to RCA     OSA (obstructive sleep apnea) 09/29/2011    Orientation RESPIRATION BLADDER Height & Weight     Self, Situation, Place  Normal Continent Weight: 260 lb (117.9 kg) Height:  '5\' 9"'$  (175.3 cm)  BEHAVIORAL SYMPTOMS/MOOD NEUROLOGICAL BOWEL NUTRITION STATUS      Continent Diet (see d/c summary)  AMBULATORY STATUS COMMUNICATION OF NEEDS Skin   Total Care   Normal (appropriate for ethnicity; dry and flaky)                       Personal Care Assistance Level of Assistance  Bathing, Feeding,  Dressing Bathing Assistance: Maximum assistance Feeding assistance: Limited assistance Dressing Assistance: Maximum assistance     Functional Limitations Info  Sight, Hearing, Speech Sight Info: Impaired Hearing Info: Adequate Speech Info: Adequate    SPECIAL CARE FACTORS FREQUENCY  PT (By licensed PT), OT (By licensed OT)     PT Frequency: 5x/ week OT Frequency: 5x/ week            Contractures      Additional Factors Info  Code Status, Allergies, Psychotropic Code Status Info: DNR Allergies Info: Sulfa Antibiotics Psychotropic Info: clonazePAM         Current Medications (06/13/2021):  This is the current hospital active medication list Current Facility-Administered Medications  Medication Dose Route Frequency Provider Last Rate Last Admin   acetaminophen (TYLENOL) tablet 650 mg  650 mg Oral Q6H PRN Karmen Bongo, MD   650 mg at 06/12/21 2230   Or   acetaminophen (TYLENOL) suppository 650 mg  650 mg Rectal Q6H PRN Karmen Bongo, MD       amantadine (SYMMETREL) capsule 100 mg  100 mg Oral Ivery Quale, MD   100 mg at 06/13/21 0043   atorvastatin (LIPITOR) tablet 10 mg  10 mg Oral Cathlean Sauer, MD   10 mg at 06/12/21 2008   And   atorvastatin (LIPITOR) tablet 5 mg  5 mg Oral Cathlean Sauer, MD   5 mg at 06/13/21  6045   bisacodyl (DULCOLAX) EC tablet 5 mg  5 mg Oral Daily PRN Karmen Bongo, MD       carbidopa-levodopa (SINEMET IR) 25-100 MG per tablet immediate release 3 tablet  3 tablet Oral Lynne Logan, MD   3 tablet at 06/13/21 0917   ceFEPIme (MAXIPIME) 2 g in sodium chloride 0.9 % 100 mL IVPB  2 g Intravenous Lillia Mountain, MD 200 mL/hr at 06/13/21 0506 2 g at 06/13/21 0506   clonazePAM (KLONOPIN) disintegrating tablet 0.25 mg  0.25 mg Oral BID PRN Karmen Bongo, MD       docusate sodium (COLACE) capsule 100 mg  100 mg Oral BID Karmen Bongo, MD   100 mg at 06/13/21 4098   entacapone (COMTAN) tablet 200 mg  200 mg Oral  Lynne Logan, MD   200 mg at 06/13/21 0915   finasteride (PROSCAR) tablet 5 mg  5 mg Oral Daily Karmen Bongo, MD   5 mg at 06/13/21 1191   hydrALAZINE (APRESOLINE) injection 5 mg  5 mg Intravenous Q4H PRN Karmen Bongo, MD       lactated ringers infusion   Intravenous Continuous Karmen Bongo, MD 75 mL/hr at 06/13/21 1006 New Bag at 06/13/21 1006   metoprolol tartrate (LOPRESSOR) tablet 50 mg  50 mg Oral BID Karmen Bongo, MD   50 mg at 06/13/21 4782   morphine (PF) 2 MG/ML injection 2 mg  2 mg Intravenous Q2H PRN Karmen Bongo, MD       ondansetron [facility]) tablet 4 mg  4 mg Oral Q6H PRN Karmen Bongo, MD       Or   ondansetron [facility]) injection 4 mg  4 mg Intravenous Q6H PRN Karmen Bongo, MD       oxyCODONE (Oxy IR/ROXICODONE) immediate release tablet 5 mg  5 mg Oral Q4H PRN Karmen Bongo, MD   5 mg at 06/13/21 1005   polyethylene glycol (MIRALAX / GLYCOLAX) packet 17 g  17 g Oral Daily Karmen Bongo, MD   17 g at 06/13/21 9562   polyethylene glycol (MIRALAX / GLYCOLAX) packet 17 g  17 g Oral Daily PRN Karmen Bongo, MD       potassium chloride SA (KLOR-CON M) CR tablet 40 mEq  40 mEq Oral Once Mariel Aloe, MD       rivaroxaban [facility]) tablet 20 mg  20 mg Oral Q supper Karmen Bongo, MD   20 mg at 06/12/21 1922   senna-docusate (Senokot-S) tablet 1 tablet  1 tablet Oral BID Karmen Bongo, MD   1 tablet at 06/13/21 1308   sodium chloride flush (NS) 0.9 % injection 3 mL  3 mL Intravenous Lillia Mountain, MD   3 mL at 06/13/21 0919   tadalafil (CIALIS) tablet 5 mg  5 mg Oral QPM Karmen Bongo, MD   5 mg at 06/12/21 2008   traMADol (ULTRAM) tablet 50 mg  50 mg Oral TID PRN Karmen Bongo, MD   50 mg at 06/12/21 1747     Discharge Medications: Please see discharge summary for a list of discharge medications.  Relevant Imaging Results:  Relevant Lab Results:   Additional Information SS# [id number]; patient reports COVID vax x5;  5'9"  260lbs  [name], LCSWA

## 2021-06-13 NOTE — TOC Initial Note (Signed)
Transition of Care [facility]) - Initial/Assessment Note    Patient Details  Name: Isaiah Pena MRN: [id number] Date of Birth: Oct 07, 1950  Transition of Care [facility]) CM/SW Contact:    Milinda Antis, [place] Phone Number: 06/13/2021, 12:52 PM  Clinical Narrative:                 CSW received consult for possible SNF placement at time of discharge. CSW spoke with patient, patient's significant other, and patient's son [name].   Patient and family expressed understanding of PT recommendation and is agreeable to SNF placement at time of discharge. Family reports preference for [facility]. CSW discussed insurance authorization process and will provide Medicare SNF ratings list. Patient has received 5 COVID vaccines. CSW will send out referrals for review.  No further questions reported at this time.   The patient and family requested to complete HCPOA.  CSW [employer].    Skilled Nursing Rehab Facilities-   [email or web address]   Ratings out of 5 possible   Name Address  Phone # [place] Inspection Overall  [facility] [place] [address] [place] '4 5 2 3  '$ [facility]  [address], [place] [phone] '3 2 5 5  '$ [facility] [place], [place] '3 1 1 1  '$ [place], [place] '3 2 4 4  '$ [facility] [address] [place] '1 1 2 1  '$ [place] '2 1 4 3  '$ [facility] [address] [place] '5 2 3 4  '$ [facility] [address], [place] '5 2 2 3  '$ [address] ([place]) [place], [place] [phone] '5 1 2 2  '$ [facility] [phone] [address], [place] [phone] '4 1 2 1  '$ [facility] [address] [facility] [phone] '4 1 2 1  '$ [facility] ([place]) [place]. [place], [place]  [phone] '4 1 1 1  '$ Dustin Flock [address] [phone] '3 2 4 4          '$ [place] [address], [place] '4 2 3 3  '$ [facility] [place] [address], [place] '4 1 5 4  '$ [place], [place] [phone] '2 1 1 1  '$ [facility] [address], [employer] [phone] '2 1 3 2          '$ [address] (no [facility]) [address], [place] [phone] '4 5 5 5  '$ [facility] (No [employer]) [address], [place] '3 1 4 3  '$ [facility] (No UHC) [place], [place] '5 5 5 5  '$ [facility] [address] [employer] [phone] '3 2 4 4  '$ [facility] [place] [address] [place] '1 1 2 1  '$ [facility] [address], [place] [phone] '2 1 1 1  '$ [facility] [place], [place] '5 2 4 5  '$ [facility] [address], [place] '3 1 1 1  '$ [facility] [place], [place] '2 1 2 1          '$ [facility] [address] [place] [phone] '1 1 1 1  '$ [facility] [address] [place]  [phone] '2 4 2 2  '$ [facility] [place]  [address], [place] [phone] '5 2 3 4  '$ [place] [address], [place] '2 1 1 1  '$ [place] (No Humana) [address] [place] [phone] '2 1 3 2  '$ [facility] [address], [place] [phone] '3 1 1 1          '$ [facility] [place], [place] '5 4 5 5  '$ [facility])  [address], [place] '2 2 3 3  '$ [facility]) [place] [address] [place] [phone] '3 2 4 4  '$ [facility] [place] [address] [place] '4 3 4 4  '$ [address] [place], [place] '3 3 1 1  '$ [facility]) [address] [place] [phone] '2 2 4 4     '$ Expected Discharge Plan: [place] Barriers to Discharge: Continued Medical Work up, SNF Pending bed offer   Patient Goals and CMS Choice Patient states their goals for this hospitalization and ongoing recovery are:: To go to SNF CMS Medicare.gov Compare Post Acute Care list provided to:: Patient Represenative (must comment) Choice offered to / list presented to : Patient, Adult Children, Spouse  Expected Discharge Plan and Services Expected Discharge Plan: [place]                                              Prior Living Arrangements/Services   Lives with:: Significant Other Patient language and need for interpreter reviewed:: Yes Do you feel safe going back to the place where you live?: Yes      Need for Family Participation in Patient Care: Yes (Comment) Care giver support system in place?: Yes (comment)   Criminal Activity/Legal Involvement Pertinent to Current Situation/Hospitalization: No - Comment as needed  Activities of Daily Living Home Assistive Devices/Equipment: Walker (specify type) ADL Screening (condition at time of admission) Patient's cognitive ability adequate to safely complete daily activities?: No Is the patient deaf or have difficulty hearing?: No Does the patient have difficulty seeing, even when wearing glasses/contacts?: No Does the patient have difficulty concentrating, remembering, or making decisions?: No Patient able to express need for assistance with ADLs?: Yes Does the patient have difficulty dressing or bathing?: Yes Independently performs ADLs?: No Communication: Independent Dressing (OT): Needs assistance Is this a change from baseline?: Change from baseline, expected to last <3days Is this a change from baseline?: Change from baseline, expected to last <3 days Feeding: Independent Bathing: Needs assistance Is this a change  from baseline?: Change from baseline, expected to last <3 days Toileting: Needs assistance Is this a change from baseline?: Change from baseline, expected to last <3 days In/Out Bed: Needs assistance Is this a change from baseline?: Change from baseline, expected to last <3 days Walks in Home: Needs assistance Is this a change from baseline?: Change from baseline, expected to last <3 days Does the patient have difficulty walking or climbing stairs?: Yes Weakness of Legs: Both Weakness of Arms/Hands: Both  Permission Sought/Granted   Permission granted to share information with : Yes, Verbal Permission Granted     Permission granted to share info w AGENCY: SNF        Emotional Assessment Appearance:: Appears stated age Attitude/Demeanor/Rapport: Lethargic, Gracious Affect (typically observed): Flat Orientation: : Oriented to Self, Oriented to Situation, Oriented to  Place Alcohol / Substance Use: Not Applicable Psych Involvement: No (comment)  Admission diagnosis:  Lower urinary tract infectious disease [N39.0] Confusion [R41.0] Weakness [V78.5] Complicated UTI (urinary tract infection) [N39.0] Patient Active Problem List   Diagnosis Date Noted   Complicated UTI (urinary tract infection) 06/12/2021   Urinary retention with incomplete bladder emptying 06/12/2021   Debility 05/24/2021   AKI with acute urinary retention  05/21/2021   Constipation 05/21/2021   Right renal mass 05/21/2021   Leukocytosis 05/21/2021   Parkinson disease ([place]) 05/21/2021   Persistent atrial fibrillation ([place]) 11/08/2012   Central serous retinopathy    Hypertension    GERD (gastroesophageal reflux disease)    Kidney stone    History of torn meniscus of right knee    Edema of extremities    Hyperlipidemia    BPH (benign prostatic hyperplasia)    Coronary artery disease s/p PCI to RCA     OSA (obstructive sleep apnea) 09/29/2011   PCP:  Mayra Neer, MD Pharmacy:   [place], [place] - [address] [place] [address] Phone: [phone] Fax: [place], [place] - [address] AT [facility] [place] [facility] [place] [address] Phone: [phone] Fax: [phone]     Social Determinants of Health (SDOH) Interventions    Readmission Risk Interventions     View : No data to display.

## 2021-06-13 NOTE — Consult Note (Signed)
Consultation Note Date: 06/13/2021   Patient Name: Isaiah Pena  DOB: Dec 18, 1950  MRN: [id number]  Age / Sex: 71 y.o., male  PCP: Mayra Neer, MD Referring Physician: Mariel Aloe, MD  Reason for Consultation: Establishing goals of care  HPI/Patient Profile: 71 y.o. male  with past medical history of A-fib on Xarelto, CAD status post stent, HTN/HLD, OSA, BPH, Parkinson's disease, admitted on 6/62/9476 with complicated UTI, urinary retention, constipation.   Clinical Assessment and Goals of Care: I have reviewed medical records including EPIC notes, labs and imaging, received report from RN, assessed the patient.  Isaiah Pena, Isaiah Pena, is lying quietly in bed.  He appears acutely/chronically ill and frail, obese.  He will briefly make but not keep eye contact.  He wakes easily, and is oriented x2, but returns to sleeping relatively easily.  I believe that he can make his basic needs known.  His significant other of 22 years, Isaiah Pena is at bedside.  We meet at bedside to discuss diagnosis prognosis, GOC, EOL wishes, disposition and options.  I introduced Palliative Medicine as specialized medical care for people living with serious illness. It focuses on providing relief from the symptoms and stress of a serious illness. The goal is to improve quality of life for both the patient and the family.  We discussed a brief life review of the patient.  Isaiah Pena and Isaiah Pena have been a couple for 22 years.  They are both musicians.  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  Isaiah Pena has 1 child, son Isaiah Pena.  He had been living independently but with periods of forgetfulness until UTI beginning of May.  He has had a marked decline since then.  We then focused on their current illness. The natural disease trajectory and expectations at EOL were  discussed.  We talked about UTI being complicated in man.  We talk about the likelihood that Isaiah Pena will need to continue urinary catheter and have recurrent UTIs.  Isaiah Pena states understanding and agreement.  We talk about the progressive nature of Parkinson's disease and the likelihood of neurological declines.  Advanced directives, concepts specific to code status, were considered and discussed.  Patient and family endorse DNR/"treat the treatable, but allowing natural death".  DNR in ACP tab of epic.  Hospice and Palliative Care services outpatient were explained and offered.  We talk about outpatient palliative care, how this differs from hospice care.  We talk about the benefits of continued goals of care discussions with the assistance of monthly NP visits.  At this point Isaiah Pena is agreeable to outpatient palliative services.  We talked about provider choice.  PMT to follow-up.  Discussed the importance of continued conversation with family and the medical providers regarding overall plan of care and treatment options, ensuring decisions are within the context of the patient's values and GOCs.  Questions and concerns were addressed. The family was encouraged to call with questions or concerns.  PMT will continue  to support holistically.  Conference with attending, bedside nursing staff and [facility] team related to patient condition, needs, Isaiah Pena and disposition.    HCPOA NEXT OF KIN -legally, son Isaiah Pena is Ambulance person.  Family involves significant other of 22 years, Isaiah Pena, in decision making.  Healthcare power of attorney paperwork provided to family.    SUMMARY OF RECOMMENDATIONS   At this point continue to treat the treatable but no CPR or intubation Seeking short-term rehab then long-term care placement Outpatient palliative services, provider choice undetermined   Code Status/Advance Care Planning: DNR  Symptom Management:  Per hospitalist, no additional needs at this  time.  Palliative Prophylaxis:  Frequent Pain Assessment  Additional Recommendations (Limitations, Scope, Preferences): Treat the treatable but no CPR or intubation  Psycho-social/Spiritual:  Desire for further Chaplaincy support:yes -assistance with HCPOA paperwork Additional Recommendations: Caregiving  Support/Resources  Prognosis:  Unable to determine, based on outcomes.  Not discussed with family.  3 to 6 months or less would not be surprising based on 3 hospital stays in the last 6 months, progressive nature of Parkinson's, complicated UTI.  Discharge Planning:  Anticipate short-term rehab with goal of long-term care placement, outpatient palliative to follow.       Primary Diagnoses: Present on Admission:  Complicated UTI (urinary tract infection)  Persistent atrial fibrillation (HCC)  Parkinson disease (HCC)  OSA (obstructive sleep apnea)  Hypertension  Hyperlipidemia  Urinary retention with incomplete bladder emptying  Constipation  Debility   I have reviewed the medical record, interviewed the patient and family, and examined the patient. The following aspects are pertinent.  Past Medical History:  Diagnosis Date   Anticoagulant long-term use    xarelto   Bilateral lower extremity edema    BPH (benign prostatic hyperplasia)    BPH (benign prostatic hyperplasia)    Central serous retinopathy    LOSS OF CENTER VISION LEFT EYE W/ BLURRED VISION-  was treated  methotrexate by specialist at [facility] and released   Coronary artery disease cardiologist-  dr Tressia Miners turner   01/ 2004  abnormal cardiolite  s/p  cardiac cath w/ PCI and DES to dRCA   Edema of extremities    CHRONIC    Erectile dysfunction    GERD (gastroesophageal reflux disease)    History of colon polyps    2011 per pt benign   History of kidney stones    History of simple renal cyst    drained via radiology   History of torn meniscus of right knee    Hyperlipidemia    Hypertension    Kidney  stone    HX OF STONE-PASSED    Lower urinary tract symptoms (LUTS)    OSA (obstructive sleep apnea) 09/29/2011   NPSG 2004:  AHI 25/hr  Rx with [employer]vel.      OSA treated with BiPAP    moderate per study 01-23-2002   Parkinson's disease [facility])    neurologist-  dr ed hill ([facility])   Permanent atrial fibrillation ([place])    Persistent atrial fibrillation ([place]) 11/08/2012   S/P drug eluting coronary stent placement    01/ 2004  x1 to dRCA   Social History   Socioeconomic History   Marital status: Married    Spouse name: Not on file   Number of children: Not on file   Years of education: Not on file   Highest education level: Not on file  Occupational History   Not on file  Tobacco Use   Smoking status: Never  Smokeless tobacco: Never  Substance and Sexual Activity   Alcohol use: No   Drug use: No   Sexual activity: Not on file  Other Topics Concern   Not on file  Social History Narrative   Not on file   Social Determinants of Health   Financial Resource Strain: Not on file  Food Insecurity: Not on file  Transportation Needs: Not on file  Physical Activity: Not on file  Stress: Not on file  Social Connections: Not on file   History reviewed. No pertinent family history. Scheduled Meds:  amantadine  100 mg Oral QHS   atorvastatin  10 mg Oral QODAY   And   atorvastatin  5 mg Oral QODAY   carbidopa-levodopa  3 tablet Oral Q8H   docusate sodium  100 mg Oral BID   entacapone  200 mg Oral Q8H   finasteride  5 mg Oral Daily   metoprolol tartrate  50 mg Oral BID   polyethylene glycol  17 g Oral Daily   rivaroxaban  20 mg Oral Q supper   senna-docusate  1 tablet Oral BID   sodium chloride flush  3 mL Intravenous Q12H   tadalafil  5 mg Oral QPM   Continuous Infusions:  ceFEPime (MAXIPIME) IV 2 g (06/13/21 0506)   lactated ringers 75 mL/hr at 06/13/21 1006   PRN Meds:.acetaminophen **OR** acetaminophen, bisacodyl, clonazepam, hydrALAZINE, morphine injection,  ondansetron **OR** ondansetron (ZOFRAN) IV, oxyCODONE, polyethylene glycol, traMADol Medications Prior to Admission:  Prior to Admission medications   Medication Sig Start Date End Date Taking? Authorizing Provider  acetaminophen (TYLENOL) 650 MG CR tablet Take 2 tablets (1,300 mg total) by mouth every 8 (eight) hours as needed for pain. 06/05/21  Yes Setzer, Edman Circle, PA-C  amantadine (SYMMETREL) 100 MG capsule Take 100 mg by mouth See admin instructions. Take one capsule (100 mg) by mouth daily at bedtime - 1am 03/14/21  Yes [provider]  amLODipine-benazepril (LOTREL) 10-40 MG capsule Take 1 capsule by mouth daily.   Yes [provider]  atorvastatin (LIPITOR) 10 MG tablet TAKE 1/2 TABLET BY MOUTH EVERY OTHER DAY AND ALTERNATING 1 TABLET BY MOUTH EVERY OTHER DAY. Please keep upcoming appt in April 2023 before anymore refills. Thank you Patient taking differently: Take 5-10 mg by mouth See admin instructions. Take 1/2 tablet (5 mg) by mouth every other evening, alternate with 1 tablet (10 mg) every other evening. Please keep upcoming appt in April 2023 before anymore refills. Thank you 02/05/21  Yes Turner, Eber Hong, MD  carbidopa-levodopa (SINEMET IR) 25-100 MG tablet Take 3 tablets by mouth every 8 (eight) hours. 8am, 5pm and 1am 05/03/16  Yes [provider]  clonazePAM (KLONOPIN) 0.5 MG tablet Take 0.25 mg by mouth 2 (two) times daily as needed for anxiety. 05/01/21  Yes [provider]  entacapone (COMTAN) 200 MG tablet Take 200 mg by mouth every 8 (eight) hours. 9am, 5pm, 1am 09/16/14  Yes [provider]  esomeprazole (NEXIUM) 20 MG capsule Take 20 mg by mouth every other day. OTC   Yes [provider]  ezetimibe (ZETIA) 10 MG tablet Take 10 mg by mouth daily. 06/02/21  Yes [provider]  finasteride (PROSCAR) 5 MG tablet TAKE 1 TABLET(5 MG) BY MOUTH DAILY Patient taking differently: Take 5 mg by mouth daily. 06/05/21  Yes Setzer,  Edman Circle, PA-C  magnesium gluconate (MAGONATE) 500 MG tablet Take 1 tablet (500 mg total) by mouth 2 (two) times daily. 06/05/21  Yes  Setzer, Edman Circle, PA-C  metoprolol (LOPRESSOR) 50 MG tablet Take 50 mg by mouth 2 (two) times daily.   Yes [provider]  polyethylene glycol (MIRALAX / GLYCOLAX) 17 g packet Take 17 g by mouth daily. 05/25/21  Yes Shahmehdi, Seyed A, MD  potassium chloride SA (KLOR-CON M) 20 MEQ tablet Take 1 tablet (20 mEq total) by mouth every other day. 06/05/21  Yes Setzer, Edman Circle, PA-C  rivaroxaban (XARELTO) 20 MG TABS tablet Take 1 tablet (20 mg total) by mouth daily with supper. Please keep upcoming telemedicine appointment in June. Thank you. 06/06/21  Yes Turner, Eber Hong, MD  senna-docusate (SENOKOT-S) 8.6-50 MG tablet Take 1 tablet by mouth 2 (two) times daily. 05/24/21 06/23/21 Yes Shahmehdi, Seyed A, MD  tadalafil (CIALIS) 5 MG tablet Take 5 mg by mouth every evening.   Yes [provider]  traMADol (ULTRAM) 50 MG tablet Take 50 mg by mouth 3 (three) times daily as needed (pain).   Yes [provider]   Allergies  Allergen Reactions   Sulfa Antibiotics Hives   Review of Systems  Unable to perform ROS: Other   Physical Exam Vitals and nursing note reviewed.  Constitutional:      General: He is not in acute distress.    Appearance: He is ill-appearing.  Cardiovascular:     Rate and Rhythm: Normal rate.  Pulmonary:     Effort: Pulmonary effort is normal. No respiratory distress.  Abdominal:     Palpations: Abdomen is soft.  Skin:    General: Skin is warm and dry.  Neurological:     Mental Status: He is alert.     Comments: Oriented to person and place, off about time   Psychiatric:        Mood and Affect: Mood normal.        Behavior: Behavior normal.    Vital Signs: BP (!) 143/84 (BP Location: Left Arm)   Pulse 67   Temp 98.6 F (37 C)   Resp 20   Ht '5\' 9"'$  (1.753 m)   Wt 117.9 kg   SpO2 97%   BMI 38.40 kg/m  Pain Scale:  0-10   Pain Score: Asleep   SpO2: SpO2: 97 % O2 Device:SpO2: 97 % O2 Flow Rate: .O2 Flow Rate (L/min): 0 L/min  IO: Intake/output summary:  Intake/Output Summary (Last 24 hours) at 06/13/2021 1143 Last data filed at 06/13/2021 0500 Gross per 24 hour  Intake 1250 ml  Output 2475 ml  Net -1225 ml    LBM: Last BM Date :  (06/09/2021) Baseline Weight: Weight: 117.9 kg Most recent weight: Weight: 117.9 kg     Palliative Assessment/Data:   Flowsheet Rows    Flowsheet Row Most Recent Value  Intake Tab   Referral Department Hospitalist  Unit at Time of Referral Cardiac/Telemetry Unit  Palliative Care Primary Diagnosis Nephrology  Date Notified 06/12/21  Palliative Care Type New Palliative care  Reason for referral Clarify Goals of Care  Date of Admission 06/12/21  Date first seen by Palliative Care 06/13/21  # of days Palliative referral response time 1 Day(s)  # of days IP prior to Palliative referral 0  Clinical Assessment   Palliative Performance Scale Score 40%  Pain Max last 24 hours Not able to report  Pain Min Last 24 hours Not able to report  Dyspnea Max Last 24 Hours Not able to report  Dyspnea Min Last 24 hours Not able to report  Psychosocial & Spiritual Assessment  Palliative Care Outcomes        Time In: 0940 Time Out: 1055 Time Total: 75 minutes  Greater than 50%  of this time was spent counseling and coordinating care related to the above assessment and plan.  Signed by: Drue Novel, NP   Please contact Palliative Medicine Team phone at [phone] for questions and concerns.  For individual provider: See Shea Evans

## 2021-06-13 NOTE — Progress Notes (Addendum)
PROGRESS NOTE    Isaiah Pena  HEN:[id number] DOB: 1951-01-16 DOA: 06/12/2021 PCP: Mayra Neer, MD   Brief Narrative: Isaiah Pena is a 71 y.o. male atrial fibrillation on Xarelto, CAD s/p stent, hypertension, hyperlipidemia, OSA, BPH, parkinson disease. Patient presented secondary to weakness with concern for UTI. Empiric Cefepime initiated.   Assessment and Plan:  UTI Urinalysis concerning for infection. Associated fever. Patient started empirically on Cefepime. Blood culture (single) and urine culture obtained on admission. -Continue Cefepime -Follow-up culture data  Urinary retention Patient has a foley catheter. Patient meant to follow-up with urology. Previously on tolterodine which has been discontinued. Patient is on tadalafil and finasteride -Continue foley catheter -Continue finasteride and tadalafil  Constipation -Continue Senokot-S  Persistent atrial fibrillation Currently rate controlled although did have some transient RVR on admission. -Continue Xarelto and metoprolol -Continue telemetry for 24 hours  Parkinson disease -Continue Sinemet, amantadine and entacapone -PT/OT recommendations: SNF  Hypokalemia -Kdur  Primary hypertension -Continue metoprolol  Hyperlipidemia -Continue Lipitor  OSA Noted.  DVT prophylaxis: Xarelto Code Status:   Code Status: DNR Family Communication: Significant other at bedside Disposition Plan: Discharge to SNF likely in 2-3 days.   Consultants:  None  Procedures:  None  Antimicrobials: Cefepime IV    Subjective: Patient reports poor appetite. He reports having some feeling of confusion/delirium overnight with his fevers.  Objective: BP (!) 143/84 (BP Location: Left Arm)   Pulse 67   Temp 98.6 F (37 C)   Resp 20   Ht '5\' 9"'$  (1.753 m)   Wt 117.9 kg   SpO2 97%   BMI 38.40 kg/m   Examination:  General exam: Appears calm and comfortable Respiratory system: Clear to auscultation. Respiratory  effort normal. Cardiovascular system: S1 & S2 heard, Normal rate with regular rhythm. Gastrointestinal system: Abdomen is nondistended, soft and nontender. Normal bowel sounds heard. Central nervous system: Alert and oriented. Musculoskeletal: No edema. No calf tenderness Skin: No cyanosis. No rashes   Data Reviewed: I have personally reviewed following labs and imaging studies  CBC Lab Results  Component Value Date   WBC 8.1 06/13/2021   RBC 4.15 (L) 06/13/2021   HGB 14.0 06/13/2021   HCT 40.0 06/13/2021   MCV 96.4 06/13/2021   MCH 33.7 06/13/2021   PLT 143 (L) 06/13/2021   MCHC 35.0 06/13/2021   RDW 13.1 06/13/2021   LYMPHSABS 0.9 06/12/2021   MONOABS 0.6 06/12/2021   EOSABS 0.1 06/12/2021   BASOSABS 0.0 36/14/4315     Last metabolic panel Lab Results  Component Value Date   NA 136 06/13/2021   K 3.2 (L) 06/13/2021   CL 104 06/13/2021   CO2 27 06/13/2021   BUN 7 (L) 06/13/2021   CREATININE 0.65 06/13/2021   GLUCOSE 119 (H) 06/13/2021   GFRNONAA >60 06/13/2021   GFRAA 106 11/17/2019   CALCIUM 8.1 (L) 06/13/2021   PHOS 3.7 05/22/2021   PROT 6.4 (L) 06/12/2021   ALBUMIN 3.2 (L) 06/12/2021   LABGLOB 2.4 11/17/2019   AGRATIO 1.8 11/17/2019   BILITOT 1.3 (H) 06/12/2021   ALKPHOS 88 06/12/2021   AST 18 06/12/2021   ALT 7 06/12/2021   ANIONGAP 5 06/13/2021    GFR: Estimated Creatinine Clearance: 108.9 mL/min (by C-G formula based on SCr of 0.65 mg/dL).  Recent Results (from the past 240 hour(s))  Culture, blood (single) w Reflex to ID Panel     Status: None (Preliminary result)   Collection Time: 06/13/21  3:05 AM   Specimen:  BLOOD LEFT HAND  Result Value Ref Range Status   Specimen Description BLOOD LEFT HAND  Final   Special Requests   Final    BOTTLES DRAWN AEROBIC ONLY Blood Culture adequate volume   Culture   Final    NO GROWTH < 12 HOURS Performed at [facility], [address] [place], [place] [address]    Report Status PENDING  Incomplete       Radiology Studies: CT Head Wo Contrast  Result Date: 06/12/2021 CLINICAL DATA:  Delirium EXAM: CT HEAD WITHOUT CONTRAST TECHNIQUE: Contiguous axial images were obtained from the base of the skull through the vertex without intravenous contrast. RADIATION DOSE REDUCTION: This exam was performed according to the departmental dose-optimization program which includes automated exposure control, adjustment of the mA and/or kV according to patient size and/or use of iterative reconstruction technique. COMPARISON:  None Available. FINDINGS: Brain: No evidence of acute infarction, hemorrhage, hydrocephalus, extra-axial collection or mass lesion/mass effect. Mild for age patchy white matter hypodensities, nonspecific but compatible with chronic microvascular ischemic disease. Vascular: No hyperdense vessel identified. Skull: No acute fracture. Sinuses/Orbits: Mild paranasal sinus mucosal thickening. No acute orbital findings. Other: No mastoid effusions. IMPRESSION: No evidence of acute intracranial abnormality. Electronically Signed   By: Margaretha Sheffield M.D.   On: 06/12/2021 15:56   DG Chest Portable 1 View  Result Date: 06/12/2021 CLINICAL DATA:  Weakness, recent hospitalization for urine retention. EXAM: PORTABLE CHEST 1 VIEW COMPARISON:  Radiograph May 21, 2021 FINDINGS: Similar cardiomegaly. No overt pulmonary edema or focal airspace consolidation. No visible pleural effusion or pneumothorax. No acute osseous abnormality. IMPRESSION: Cardiomegaly without overt pulmonary edema or focal airspace consolidation. Electronically Signed   By: Dahlia Bailiff M.D.   On: 06/12/2021 15:01      LOS: 1 day    Cordelia Poche, MD [facility] 06/13/2021, 11:15 AM   If 7PM-7AM, please contact night-coverage [email or web address]

## 2021-06-14 DIAGNOSIS — R339 Retention of urine, unspecified: Secondary | ICD-10-CM | POA: Diagnosis not present

## 2021-06-14 DIAGNOSIS — Z515 Encounter for palliative care: Secondary | ICD-10-CM | POA: Diagnosis not present

## 2021-06-14 DIAGNOSIS — I4819 Other persistent atrial fibrillation: Secondary | ICD-10-CM | POA: Diagnosis not present

## 2021-06-14 DIAGNOSIS — N390 Urinary tract infection, site not specified: Secondary | ICD-10-CM | POA: Diagnosis not present

## 2021-06-14 DIAGNOSIS — Z7189 Other specified counseling: Secondary | ICD-10-CM | POA: Diagnosis not present

## 2021-06-14 LAB — PROCALCITONIN: Procalcitonin: <0.1 ng/mL

## 2021-06-14 LAB — URINE CULTURE: Culture: >=100000 — AB

## 2021-06-14 LAB — POTASSIUM: Potassium: 3.4 mmol/L — ABNORMAL LOW (ref 3.5–5.1)

## 2021-06-14 MED ORDER — ENSURE ENLIVE PO LIQD
237.0000 mL | Freq: Two times a day (BID) | ORAL | Status: DC
  Administered 2021-06-14 – 2021-06-17 (×6): 237 mL via ORAL

## 2021-06-14 MED ORDER — ADULT MULTIVITAMIN W/MINERALS CH
1.0000 | ORAL_TABLET | Freq: Every day | ORAL | Status: DC
  Administered 2021-06-14 – 2021-06-17 (×4): 1 via ORAL
  Filled 2021-06-14 (×4): qty 1

## 2021-06-14 NOTE — Progress Notes (Signed)
Palliative: Chart review completed. Call to significant other of 22 years, Tera Partridge.  She shares that family has excepted a bed at [facility] and they are very pleased.  She shares that Mr. Kauer former wife was at that facility and feels that this will make his stay there easier for family.  We talked about outpatient palliative services.  At this point Alyse Low shares that they have not chosen an outpatient palliative provider.  I encouraged her to work closely with Education officer, museum here and Education officer, museum at [employer].  Plan:   At this point continue to treat the treatable but no CPR or intubation.  Patient and family seeking STR/LTC.  Transition of care team is working closely for disposition.    25 minutes   Quinn Axe, NP Palliative medicine team Team phone [phone] Greater than 50% of this time was spent counseling and coordinating care related to the above assessment and plan.

## 2021-06-14 NOTE — TOC Progression Note (Addendum)
Transition of Care [facility]) - Initial/Assessment Note    Patient Details  Name: Isaiah Pena MRN: [id number] Date of Birth: 25-Dec-1950  Transition of Care [facility]) CM/SW Contact:    Milinda Antis, [place] Phone Number: 06/14/2021, 11:57 AM  Clinical Narrative:                 CSW met with the patient, patient's son, and patient's significant other at bedside to present bed offers.  The family is reviewing the facilities and will inform CSW of choice.   14:00- CSW received a call from the patient's son [name] [phone])  informing CSW that the family choose [facility].  15:30-  CSW spoke with Elnita Maxwell at [facility] and verified that the facility can accept the patient when medically ready.  Expected Discharge Plan: Skilled Nursing Facility Barriers to Discharge: Continued Medical Work up, SNF Pending bed offer   Patient Goals and CMS Choice Patient states their goals for this hospitalization and ongoing recovery are:: To go to SNF CMS Medicare.gov Compare Post Acute Care list provided to:: Patient Represenative (must comment) Choice offered to / list presented to : Patient, Adult Children, Spouse  Expected Discharge Plan and Services Expected Discharge Plan: [place]                                              Prior Living Arrangements/Services   Lives with:: Significant Other Patient language and need for interpreter reviewed:: Yes Do you feel safe going back to the place where you live?: Yes      Need for Family Participation in Patient Care: Yes (Comment) Care giver support system in place?: Yes (comment)   Criminal Activity/Legal Involvement Pertinent to Current Situation/Hospitalization: No - Comment as needed  Activities of Daily Living Home Assistive Devices/Equipment: Walker (specify type) ADL Screening (condition at time of admission) Patient's cognitive ability adequate to safely complete daily activities?: No Is the patient deaf or  have difficulty hearing?: No Does the patient have difficulty seeing, even when wearing glasses/contacts?: No Does the patient have difficulty concentrating, remembering, or making decisions?: No Patient able to express need for assistance with ADLs?: Yes Does the patient have difficulty dressing or bathing?: Yes Independently performs ADLs?: No Communication: Independent Dressing (OT): Needs assistance Is this a change from baseline?: Change from baseline, expected to last <3days Is this a change from baseline?: Change from baseline, expected to last <3 days Feeding: Independent Bathing: Needs assistance Is this a change from baseline?: Change from baseline, expected to last <3 days Toileting: Needs assistance Is this a change from baseline?: Change from baseline, expected to last <3 days In/Out Bed: Needs assistance Is this a change from baseline?: Change from baseline, expected to last <3 days Walks in Home: Needs assistance Is this a change from baseline?: Change from baseline, expected to last <3 days Does the patient have difficulty walking or climbing stairs?: Yes Weakness of Legs: Both Weakness of Arms/Hands: Both  Permission Sought/Granted   Permission granted to share information with : Yes, Verbal Permission Granted     Permission granted to share info w AGENCY: SNF        Emotional Assessment Appearance:: Appears stated age Attitude/Demeanor/Rapport: Lethargic, Gracious Affect (typically observed): Flat Orientation: : Oriented to Self, Oriented to Situation, Oriented to Place Alcohol / Substance Use: Not Applicable Psych Involvement: No (comment)  Admission diagnosis:  Lower urinary tract infectious disease [N39.0] Confusion [R41.0] Weakness [H76.1] Complicated UTI (urinary tract infection) [N39.0] Patient Active Problem List   Diagnosis Date Noted   Complicated UTI (urinary tract infection) 06/12/2021   Urinary retention with incomplete bladder emptying  06/12/2021   Debility 05/24/2021   AKI with acute urinary retention  05/21/2021   Constipation 05/21/2021   Right renal mass 05/21/2021   Leukocytosis 05/21/2021   Parkinson disease ([place]) 05/21/2021   Persistent atrial fibrillation ([place]) 11/08/2012   Central serous retinopathy    Hypertension    GERD (gastroesophageal reflux disease)    Kidney stone    History of torn meniscus of right knee    Edema of extremities    Hyperlipidemia    BPH (benign prostatic hyperplasia)    Coronary artery disease s/p PCI to RCA     OSA (obstructive sleep apnea) 09/29/2011   PCP:  Mayra Neer, MD Pharmacy:   [place], [place] - [address] [place] [address] Phone: [phone] Fax: [place], [place] - [address] AT [facility] [address] [place] [address] Phone: [phone] Fax: [phone]     Social Determinants of Health (SDOH) Interventions    Readmission Risk Interventions     View : No data to display.

## 2021-06-14 NOTE — Progress Notes (Signed)
Initial Nutrition Assessment  DOCUMENTATION CODES:   Obesity unspecified  INTERVENTION:  - Liberalize diet from a heart healthy to a 2g sodium diet to provide widest variety of menu options to enhance nutritional adequacy  - Ensure Enlive po BID, each supplement provides 350 kcal and 20 grams of protein.  - MVI with minerals daily  NUTRITION DIAGNOSIS:   Inadequate oral intake related to decreased appetite as evidenced by meal completion < 50%.  GOAL:   Patient will meet greater than or equal to 90% of their needs  MONITOR:   PO intake, Supplement acceptance, Diet advancement, Labs, Weight trends  REASON FOR ASSESSMENT:   Consult Other (Comment) (nutrition goals)  ASSESSMENT:   Pt admitted with weakness found to have complicated UTI. Recent admit 5/2-5/6 with AKI and subsequent admit to CIR from 5/6-5/17 for debility.PMH significant for PAF on Xarelto, CAD s/p stent, HTN, HLD, OSA on BiPAP, BPH and Parkinson's.  PMT following- treat the treatable but no CPR, intubation or tube feeding  Pt with significant other at bedside. She assisted to provide nutrition history. She states that during his CIR admission, his PO intake had much improved and was eating 100% of his meals. While at home he was eating well until the past couple days they state he has only been able to eat a few bites of each meal (<50%) which they attribute to the UTI and increased confusion.   Given decreased PO intake, pt would likely benefit from addition of nutrition supplement to enhance nutritional adequacy.   Meal completions: 05/25: 0% x2 recorded meals; 25-50% x 3 recorded meals  Pt states that his usual weight is ~260 lbs and denies significant weight loss within the last year. Reviewed wt hx. Wt appears to be stable between 115.3-118.8 kg within the last year. Will continue to monitor and reassess throughout admission.   Pt reports having Parkinson's. He is able to feed himself and had good  mobility until this recent UTI and has been mostly bedbound.   Edema: mild pitting BLE  Medications: colace, protonix, miralax, senna, IV abx  Labs reviewed  NUTRITION - FOCUSED PHYSICAL EXAM:  Flowsheet Row Most Recent Value  Orbital Region No depletion  Upper Arm Region No depletion  Thoracic and Lumbar Region No depletion  Buccal Region No depletion  Temple Region No depletion  Clavicle Bone Region No depletion  Clavicle and Acromion Bone Region No depletion  Scapular Bone Region No depletion  Dorsal Hand No depletion  Patellar Region No depletion  Anterior Thigh Region No depletion  Posterior Calf Region No depletion  Edema (RD Assessment) Mild  [BLE]  Hair Reviewed  Eyes Reviewed  Mouth Reviewed  Skin Reviewed  Nails Reviewed      Diet Order:   Diet Order             Diet 2 gram sodium Room service appropriate? Yes; Fluid consistency: Thin  Diet effective now                   EDUCATION NEEDS:   Education needs have been addressed  Skin:  Skin Assessment: Reviewed RN Assessment  Last BM:  5/24 (type 2)  Height:   Ht Readings from Last 1 Encounters:  06/12/21 '5\' 9"'$  (1.753 m)    Weight:   Wt Readings from Last 1 Encounters:  06/12/21 117.9 kg    Ideal Body Weight:  72.7 kg  BMI:  Body mass index is 38.4 kg/m.  Estimated Nutritional Needs:  Kcal:  2000-2200  Protein:  100-115g  Fluid:  >/=2L  Clayborne Dana, RDN, LDN Clinical Nutrition

## 2021-06-14 NOTE — Progress Notes (Signed)
PROGRESS NOTE    Isaiah Pena  RCV:[id number] DOB: 07/07/1950 DOA: 06/12/2021 PCP: Mayra Neer, MD   Brief Narrative: Isaiah Pena is a 71 y.o. male atrial fibrillation on Xarelto, CAD s/p stent, hypertension, hyperlipidemia, OSA, BPH, parkinson disease. Patient presented secondary to weakness with concern for UTI. Empiric Cefepime initiated.   Assessment and Plan:  UTI Urinalysis concerning for infection. Associated fever. Patient started empirically on Cefepime. Blood culture (single) and urine culture obtained on admission. Urine culture gram stain significant for GNRs -Continue Cefepime -Follow-up culture data  Urinary retention Patient has a foley catheter. Patient follows with urology, Dr. Diona Fanti. Previously on tolterodine which was previously discontinued. Patient is on tadalafil and finasteride. Discussed with urology, Dr. Junious Silk,  who recommended continued treatment of UTI with transition to once daily antibiotic to allow the catheter to remain sterile. -Continue foley catheter -Continue finasteride and tadalafil  Constipation -Continue Senokot-S  Scrotal swelling -Elevate -Discontinue IV fluids  Persistent atrial fibrillation Currently rate controlled although did have some transient RVR on admission. -Continue Xarelto and metoprolol  Parkinson disease -Continue Sinemet, amantadine and entacapone -PT/OT recommendations: SNF  Hypokalemia Given supplementation  Primary hypertension -Continue metoprolol  Hyperlipidemia -Continue Lipitor  OSA Noted.  DVT prophylaxis: Xarelto Code Status:   Code Status: DNR Family Communication: Significant other at bedside Disposition Plan: Discharge to SNF likely in 1-3 days.   Consultants:  None  Procedures:  None  Antimicrobials: Cefepime IV    Subjective: Concerned about his foley placement since he has been moved quite a bit. No issues overnight. Passing gas. Last bowel movement on  5/24.  Objective: BP (!) 160/112 (BP Location: Left Arm)   Pulse (!) 104   Temp 98.1 F (36.7 C)   Resp 19   Ht '5\' 9"'$  (1.753 m)   Wt 117.9 kg   SpO2 95%   BMI 38.40 kg/m   Examination:  General exam: Appears calm and comfortable Respiratory system: Clear to auscultation. Respiratory effort normal. Cardiovascular system: S1 & S2 heard, RRR. No murmurs, rubs, gallops or clicks. Gastrointestinal system: Abdomen is distended, soft and nontender. Normal bowel sounds heard. Genitourinary system: indwelling foley in place. Scrotal swelling Central nervous system: Alert and oriented. No focal neurological deficits. Musculoskeletal: 1+ BLE edema. No calf tenderness Skin: No cyanosis. No rashes Psychiatry: Judgement and insight appear normal. Mood & affect appropriate.    Data Reviewed: I have personally reviewed following labs and imaging studies  CBC Lab Results  Component Value Date   WBC 8.1 06/13/2021   RBC 4.15 (L) 06/13/2021   HGB 14.0 06/13/2021   HCT 40.0 06/13/2021   MCV 96.4 06/13/2021   MCH 33.7 06/13/2021   PLT 143 (L) 06/13/2021   MCHC 35.0 06/13/2021   RDW 13.1 06/13/2021   LYMPHSABS 0.9 06/12/2021   MONOABS 0.6 06/12/2021   EOSABS 0.1 06/12/2021   BASOSABS 0.0 11/13/8525     Last metabolic panel Lab Results  Component Value Date   NA 136 06/13/2021   K 3.2 (L) 06/13/2021   CL 104 06/13/2021   CO2 27 06/13/2021   BUN 7 (L) 06/13/2021   CREATININE 0.65 06/13/2021   GLUCOSE 119 (H) 06/13/2021   GFRNONAA >60 06/13/2021   GFRAA 106 11/17/2019   CALCIUM 8.1 (L) 06/13/2021   PHOS 3.7 05/22/2021   PROT 6.4 (L) 06/12/2021   ALBUMIN 3.2 (L) 06/12/2021   LABGLOB 2.4 11/17/2019   AGRATIO 1.8 11/17/2019   BILITOT 1.3 (H) 06/12/2021   ALKPHOS 88  06/12/2021   AST 18 06/12/2021   ALT 7 06/12/2021   ANIONGAP 5 06/13/2021    GFR: Estimated Creatinine Clearance: 108.9 mL/min (by C-G formula based on SCr of 0.65 mg/dL).  Recent Results (from the past 240  hour(s))  Urine Culture     Status: Abnormal (Preliminary result)   Collection Time: 06/12/21  5:52 PM   Specimen: Urine, Catheterized  Result Value Ref Range Status   Specimen Description URINE, CATHETERIZED  Final   Special Requests NONE  Final   Culture (A)  Final    >=100,000 COLONIES/mL GRAM NEGATIVE RODS IDENTIFICATION AND SUSCEPTIBILITIES TO FOLLOW Performed at [facility], [address] [place], [place] [address]    Report Status PENDING  Incomplete  Culture, blood (single) w Reflex to ID Panel     Status: None (Preliminary result)   Collection Time: 06/13/21  3:05 AM   Specimen: BLOOD LEFT HAND  Result Value Ref Range Status   Specimen Description BLOOD LEFT HAND  Final   Special Requests   Final    BOTTLES DRAWN AEROBIC ONLY Blood Culture adequate volume   Culture   Final    NO GROWTH 1 DAY Performed at [facility], [place] [address], [place], [place] [address]    Report Status PENDING  Incomplete      Radiology Studies: CT Head Wo Contrast  Result Date: 06/12/2021 CLINICAL DATA:  Delirium EXAM: CT HEAD WITHOUT CONTRAST TECHNIQUE: Contiguous axial images were obtained from the base of the skull through the vertex without intravenous contrast. RADIATION DOSE REDUCTION: This exam was performed according to the departmental dose-optimization program which includes automated exposure control, adjustment of the mA and/or kV according to patient size and/or use of iterative reconstruction technique. COMPARISON:  None Available. FINDINGS: Brain: No evidence of acute infarction, hemorrhage, hydrocephalus, extra-axial collection or mass lesion/mass effect. Mild for age patchy white matter hypodensities, nonspecific but compatible with chronic microvascular ischemic disease. Vascular: No hyperdense vessel identified. Skull: No acute fracture. Sinuses/Orbits: Mild paranasal sinus mucosal thickening. No acute orbital findings. Other: No mastoid effusions. IMPRESSION: No  evidence of acute intracranial abnormality. Electronically Signed   By: Margaretha Sheffield M.D.   On: 06/12/2021 15:56   DG Chest Portable 1 View  Result Date: 06/12/2021 CLINICAL DATA:  Weakness, recent hospitalization for urine retention. EXAM: PORTABLE CHEST 1 VIEW COMPARISON:  Radiograph May 21, 2021 FINDINGS: Similar cardiomegaly. No overt pulmonary edema or focal airspace consolidation. No visible pleural effusion or pneumothorax. No acute osseous abnormality. IMPRESSION: Cardiomegaly without overt pulmonary edema or focal airspace consolidation. Electronically Signed   By: Dahlia Bailiff M.D.   On: 06/12/2021 15:01      LOS: 2 days    Cordelia Poche, MD [facility] 06/14/2021, 10:06 AM   If 7PM-7AM, please contact night-coverage [email or web address]

## 2021-06-14 NOTE — Evaluation (Signed)
Speech Language Pathology Evaluation Patient Details Name: Isaiah Pena MRN: [id number] DOB: 04-Aug-1950 Today's Date: 06/14/2021 Time: 1137-1208 SLP Time Calculation (min) (ACUTE ONLY): 31 min  Problem List:  Patient Active Problem List   Diagnosis Date Noted   Complicated UTI (urinary tract infection) 06/12/2021   Urinary retention with incomplete bladder emptying 06/12/2021   Debility 05/24/2021   AKI with acute urinary retention  05/21/2021   Constipation 05/21/2021   Right renal mass 05/21/2021   Leukocytosis 05/21/2021   Parkinson disease ([place]) 05/21/2021   Persistent atrial fibrillation ([place]) 11/08/2012   Central serous retinopathy    Hypertension    GERD (gastroesophageal reflux disease)    Kidney stone    History of torn meniscus of right knee    Edema of extremities    Hyperlipidemia    BPH (benign prostatic hyperplasia)    Coronary artery disease s/p PCI to RCA     OSA (obstructive sleep apnea) 09/29/2011   Past Medical History:  Past Medical History:  Diagnosis Date   Anticoagulant long-term use    xarelto   Bilateral lower extremity edema    BPH (benign prostatic hyperplasia)    BPH (benign prostatic hyperplasia)    Central serous retinopathy    LOSS OF CENTER VISION LEFT EYE W/ BLURRED VISION-  was treated  methotrexate by specialist at [facility] and released   Coronary artery disease cardiologist-  dr Tressia Miners turner   01/ 2004  abnormal cardiolite  s/p  cardiac cath w/ PCI and DES to dRCA   Edema of extremities    CHRONIC    Erectile dysfunction    GERD (gastroesophageal reflux disease)    History of colon polyps    2011 per pt benign   History of kidney stones    History of simple renal cyst    drained via radiology   History of torn meniscus of right knee    Hyperlipidemia    Hypertension    Kidney stone    HX OF STONE-PASSED    Lower urinary tract symptoms (LUTS)    OSA (obstructive sleep apnea) 09/29/2011   NPSG 2004:  AHI 25/hr  Rx with bilevel.       OSA treated with BiPAP    moderate per study 01-23-2002   Parkinson's disease [facility])    neurologist-  dr ed hill ([facility])   Permanent atrial fibrillation ([place])    Persistent atrial fibrillation ([place]) 11/08/2012   S/P drug eluting coronary stent placement    01/ 2004  x1 to dRCA   Past Surgical History:  Past Surgical History:  Procedure Laterality Date   CARDIOVASCULAR STRESS TEST  05-07-2015   dr Tressia Miners turner   normal nuclear study w/ no ischemia/  normal LV function and wall motion , stress ef 55%   COLONOSCOPY WITH PROPOFOL  2011   CORONARY ANGIOPLASTY WITH STENT PLACEMENT  02-11-2002  dr Daneen Schick   abnormal cardiolite--  PCI and DES to dRCA (99%),  pRCA luminal irregularities, mild to moderate LAD and CFX disease,  normal LVF   CYSTOSCOPY WITH INSERTION OF UROLIFT N/A 01/28/2016   Procedure: CYSTOSCOPY WITH INSERTION OF UROLIFT;  Surgeon: Franchot Gallo, MD;  Location: [facility];  Service: Urology;  Laterality: N/A;   HERNIA REPAIR     AGE 71   KNEE ARTHROSCOPY  03/21/2011   Procedure: ARTHROSCOPY KNEE;  Surgeon: Tobi Bastos, MD;  Location: [facility];  Service: Orthopedics;  Laterality: Right;   HPI:  71  y/o male presented to ED on 06/12/21 for generalized weakness. Recent admission 5/2-6 for AKI with subsequent admission 5/6-17 at [facility] for debility (SLP intervention at that time focusing on breath support and vocal intensity for speech). Admitted for complicated UTI and AKI with urinary retention. PMH: Parkinson's, CAD s/p stent, HTN, OSA   Assessment / Plan / Recommendation Clinical Impression  Pt was seen for cognitive/communciative evaluation per MD request, although note that current infection is likely to be contributing to current test results. He scored 11 points out of an adjusted 24 possible points, as he deferred sections that involved writing as he did not think he could physically perform the task. Pt had some difficulty on this test with  orientation to time, immediate and delayed recall, selective attention, and problem solving.He requried extra time for processing and requested repetition of prompts at times. Pt's speech is also noted to be soft with reduced breath support. He was scheduled to begin [facility] SLP for his speech but was not able to have his evaluation because of this admission. Recommend that pt resume SLP services once at next level of care, as his cognitive abilities may continue to fluctuate until his infection clears.    SLP Assessment  SLP Recommendation/Assessment: All further Speech Lanaguage Pathology  needs can be addressed in the next venue of care SLP Visit Diagnosis: Cognitive communication deficit (R41.841);Dysarthria and anarthria (R47.1)    Recommendations for follow up therapy are one component of a multi-disciplinary discharge planning process, led by the attending physician.  Recommendations may be updated based on patient status, additional functional criteria and insurance authorization.    Follow Up Recommendations  Skilled nursing-short term rehab (<3 hours/day)    Assistance Recommended at Discharge     Functional Status Assessment Patient has had a recent decline in their functional status and demonstrates the ability to make significant improvements in function in a reasonable and predictable amount of time.  Frequency and Duration           SLP Evaluation Cognition  Overall Cognitive Status: Impaired/Different from baseline Arousal/Alertness: Awake/alert Orientation Level: Oriented to person;Oriented to place;Oriented to situation;Disoriented to time Attention: Selective Selective Attention: Impaired Selective Attention Impairment: Verbal basic Memory: Impaired Memory Impairment: Storage deficit;Retrieval deficit;Decreased recall of new information Problem Solving: Impaired Problem Solving Impairment: Verbal complex Safety/Judgment: Appears intact       Comprehension  Auditory  Comprehension Overall Auditory Comprehension: Appears within functional limits for tasks assessed (although processing speed slowed)    Expression Expression Primary Mode of Expression: Verbal Verbal Expression Overall Verbal Expression: Appears within functional limits for tasks assessed   Oral / Motor  Motor Speech Overall Motor Speech: Impaired Respiration: Impaired Level of Impairment: Phrase Phonation: Low vocal intensity Resonance: Within functional limits Articulation: Within functional limitis Level of Impairment: Word Intelligibility: Intelligibility reduced Word: 75-100% accurate Phrase: 75-100% accurate Sentence: 75-100% accurate Conversation: 75-100% accurate Motor Planning: Witnin functional limits Motor Speech Errors: Not applicable            Osie Bond., M.A. [place] Office [phone]  Secure chat preferred  06/14/2021, 12:58 PM

## 2021-06-14 NOTE — Care Management Important Message (Signed)
Important Message  Patient Details  Name: Isaiah Pena MRN: [id number] Date of Birth: 12-30-1950   Medicare Important Message Given:  Yes     Orbie Pyo 06/14/2021, 3:22 PM

## 2021-06-15 DIAGNOSIS — N390 Urinary tract infection, site not specified: Secondary | ICD-10-CM | POA: Diagnosis not present

## 2021-06-15 DIAGNOSIS — R5381 Other malaise: Secondary | ICD-10-CM | POA: Diagnosis not present

## 2021-06-15 DIAGNOSIS — G4733 Obstructive sleep apnea (adult) (pediatric): Secondary | ICD-10-CM | POA: Diagnosis not present

## 2021-06-15 DIAGNOSIS — K5900 Constipation, unspecified: Secondary | ICD-10-CM

## 2021-06-15 DIAGNOSIS — I4819 Other persistent atrial fibrillation: Secondary | ICD-10-CM

## 2021-06-15 LAB — PROCALCITONIN: Procalcitonin: <0.1 ng/mL

## 2021-06-15 MED ORDER — METOPROLOL TARTRATE 50 MG PO TABS
75.0000 mg | ORAL_TABLET | Freq: Two times a day (BID) | ORAL | Status: DC
  Administered 2021-06-16 (×2): 75 mg via ORAL
  Filled 2021-06-15 (×3): qty 1

## 2021-06-15 MED ORDER — BISACODYL 10 MG RE SUPP
10.0000 mg | Freq: Once | RECTAL | Status: AC
  Administered 2021-06-15: 10 mg via RECTAL
  Filled 2021-06-15: qty 1

## 2021-06-15 MED ORDER — SORBITOL 70 % SOLN
960.0000 mL | TOPICAL_OIL | Freq: Once | ORAL | Status: AC
  Administered 2021-06-15: 960 mL via RECTAL
  Filled 2021-06-15: qty 473

## 2021-06-15 MED ORDER — CEPHALEXIN 500 MG PO CAPS
500.0000 mg | ORAL_CAPSULE | Freq: Two times a day (BID) | ORAL | Status: DC
Start: 2021-06-15 — End: 2021-06-17
  Administered 2021-06-15 – 2021-06-17 (×5): 500 mg via ORAL
  Filled 2021-06-15 (×5): qty 1

## 2021-06-15 MED ORDER — POTASSIUM CHLORIDE CRYS ER 20 MEQ PO TBCR
40.0000 meq | EXTENDED_RELEASE_TABLET | Freq: Once | ORAL | Status: AC
  Administered 2021-06-15: 40 meq via ORAL
  Filled 2021-06-15: qty 2

## 2021-06-15 MED ORDER — CLONAZEPAM 0.25 MG PO TBDP
0.2500 mg | ORAL_TABLET | Freq: Two times a day (BID) | ORAL | Status: DC | PRN
  Administered 2021-06-16: 0.25 mg via ORAL
  Filled 2021-06-15: qty 1

## 2021-06-15 MED ORDER — METOPROLOL TARTRATE 5 MG/5ML IV SOLN
5.0000 mg | Freq: Once | INTRAVENOUS | Status: AC
  Administered 2021-06-15: 5 mg via INTRAVENOUS
  Filled 2021-06-15: qty 5

## 2021-06-15 NOTE — Progress Notes (Signed)
Pharmacy Antibiotic Note  Isaiah Pena is a 71 y.o. male admitted on 06/12/2021 with UTI.  Pharmacy has been consulted for Cefepime dosing. Patient now growing proteus resistant to only Nitrofurantoin. Patient could likely be deescalated to IV cefazolin and eventually PO cephalexin. Will d/w primary MD  Plan: Continue Cefepime 2 g IV q12h Consider change to Cefazolin 1gm IV q8h and eventually Keflex '500mg'$  PO q12h  Follow-up clinical status, renal function, LOT, de-escalate as able  Height: '5\' 9"'$  (175.3 cm) Weight: 117.9 kg (260 lb) IBW/kg (Calculated) : 70.7  Temp (24hrs), Avg:98.4 F (36.9 C), Min:98 F (36.7 C), Max:98.9 F (37.2 C)  Recent Labs  Lab 06/12/21 1405 06/12/21 1407 06/13/21 0305  WBC 8.6  --  8.1  CREATININE 0.73  --  0.65  LATICACIDVEN  --  1.6  --      Estimated Creatinine Clearance: 108.9 mL/min (by C-G formula based on SCr of 0.65 mg/dL).    Allergies  Allergen Reactions   Sulfa Antibiotics Hives    Antimicrobials this admission: Cefepime 5/24 >>  Ceftriaxone 5/24 x 1  Microbiology results: 5/24 UCx: proteus S to all but NTF   Cherril Hett A. Levada Dy, PharmD, BCPS, FNKF Clinical Pharmacist [place] Please utilize Amion for appropriate phone number to reach the unit pharmacist ([place])

## 2021-06-15 NOTE — Evaluation (Addendum)
Clinical/Bedside Swallow Evaluation Patient Details  Name: Isaiah Pena MRN: [id number] Date of Birth: Apr 22, 1950  Today's Date: 06/15/2021 Time: SLP Start Time (ACUTE ONLY): 8921 SLP Stop Time (ACUTE ONLY): 1941 SLP Time Calculation (min) (ACUTE ONLY): 14 min  Past Medical History:  Past Medical History:  Diagnosis Date   Anticoagulant long-term use    xarelto   Bilateral lower extremity edema    BPH (benign prostatic hyperplasia)    BPH (benign prostatic hyperplasia)    Central serous retinopathy    LOSS OF CENTER VISION LEFT EYE W/ BLURRED VISION-  was treated  methotrexate by specialist at [facility] and released   Coronary artery disease cardiologist-  dr Tressia Miners turner   01/ 2004  abnormal cardiolite  s/p  cardiac cath w/ PCI and DES to dRCA   Edema of extremities    CHRONIC    Erectile dysfunction    GERD (gastroesophageal reflux disease)    History of colon polyps    2011 per pt benign   History of kidney stones    History of simple renal cyst    drained via radiology   History of torn meniscus of right knee    Hyperlipidemia    Hypertension    Kidney stone    HX OF STONE-PASSED    Lower urinary tract symptoms (LUTS)    OSA (obstructive sleep apnea) 09/29/2011   NPSG 2004:  AHI 25/hr  Rx with bilevel.      OSA treated with BiPAP    moderate per study 01-23-2002   Parkinson's disease [facility])    neurologist-  dr ed hill ([facility])   Permanent atrial fibrillation ([place])    Persistent atrial fibrillation ([place]) 11/08/2012   S/P drug eluting coronary stent placement    01/ 2004  x1 to dRCA   Past Surgical History:  Past Surgical History:  Procedure Laterality Date   CARDIOVASCULAR STRESS TEST  05-07-2015   dr Tressia Miners turner   normal nuclear study w/ no ischemia/  normal LV function and wall motion , stress ef 55%   COLONOSCOPY WITH PROPOFOL  2011   CORONARY ANGIOPLASTY WITH STENT PLACEMENT  02-11-2002  dr Daneen Schick   abnormal cardiolite--  PCI and DES to dRCA  (99%),  pRCA luminal irregularities, mild to moderate LAD and CFX disease,  normal LVF   CYSTOSCOPY WITH INSERTION OF UROLIFT N/A 01/28/2016   Procedure: CYSTOSCOPY WITH INSERTION OF UROLIFT;  Surgeon: Franchot Gallo, MD;  Location: [facility];  Service: Urology;  Laterality: N/A;   HERNIA REPAIR     AGE 89   KNEE ARTHROSCOPY  03/21/2011   Procedure: ARTHROSCOPY KNEE;  Surgeon: Tobi Bastos, MD;  [facility];  Service: Orthopedics;  Laterality: Right;   HPI:  71 y/o male presented to ED on 06/12/21 for generalized weakness. Recent admission 5/2-6 for AKI with subsequent admission 5/6-17 at [facility] for debility (SLP intervention at that time focusing on breath support and vocal intensity for speech). Admitted for complicated UTI and AKI with urinary retention. BSE ordered due to some noted difficulty swallowing pills whole (throat clearing) per RN. PMH: Parkinson's, CAD s/p stent, HTN, OSA, GERD.    Assessment / Plan / Recommendation  Clinical Impression  Pt presents functional oropharyngeal swallow at bedside. He was very lethargic, but with repositioning and verbal cues he was able to achieve adequate alertness for PO trials. Pt consumed sips of thin liquids by straw via consecutive swallowing in x3 attempts without overt s/sx  of aspiration. Bites of puree and regular textures were adequately masticated/manipulated and swallowed without noted difficulty. RN entered room, reporting that she trialed small pills with water and larger pills in puree (some halved) and pt did well with this, no signs of aspiration. Recommend continue current diet with pills taken whole with water or puree as tolerated. Educated pt and family, present for session, that if difficulty swallowing pills persists/worsens, follow-up with SLP vs GI may need to be considered. No further SLP f/u warranted acutely. Will s/o.  SLP Visit Diagnosis: Dysphagia, unspecified (R13.10)    Aspiration Risk  Mild aspiration  risk    Diet Recommendation Regular;Thin liquid   Liquid Administration via: Cup;Straw Medication Administration: Other (Comment) (whole with water or puree as tolerated) Supervision: Staff to assist with self feeding;Full supervision/cueing for compensatory strategies Compensations: Minimize environmental distractions;Slow rate;Small sips/bites;Follow solids with liquid Postural Changes: Seated upright at 90 degrees    Other  Recommendations Oral Care Recommendations: Oral care BID    Recommendations for follow up therapy are one component of a multi-disciplinary discharge planning process, led by the attending physician.  Recommendations may be updated based on patient status, additional functional criteria and insurance authorization.  Follow up Recommendations Skilled nursing-short term rehab (<3 hours/day)      Assistance Recommended at Discharge Intermittent Supervision/Assistance  Functional Status Assessment Patient has had a recent decline in their functional status and demonstrates the ability to make significant improvements in function in a reasonable and predictable amount of time.  Frequency and Duration            Prognosis Prognosis for Safe Diet Advancement: Good Barriers to Reach Goals: Time post onset      Swallow Study   General Date of Onset: 06/15/21 HPI: 71 y/o male presented to ED on 06/12/21 for generalized weakness. Recent admission 5/2-6 for AKI with subsequent admission 5/6-17 at [facility] for debility (SLP intervention at that time focusing on breath support and vocal intensity for speech). Admitted for complicated UTI and AKI with urinary retention. BSE ordered due to some noted difficulty swallowing pills per RN. PMH: Parkinson's, CAD s/p stent, HTN, OSA, GERD. Type of Study: Bedside Swallow Evaluation Previous Swallow Assessment: none per EMR Diet Prior to this Study: Regular;Thin liquids Temperature Spikes Noted: No Respiratory Status: Room air History  of Recent Intubation: No Behavior/Cognition: Cooperative;Pleasant mood;Lethargic/Drowsy Oral Cavity Assessment: Within Functional Limits Oral Care Completed by SLP: No Oral Cavity - Dentition: Adequate natural dentition Vision: Functional for self-feeding Self-Feeding Abilities: Able to feed self;Needs assist Patient Positioning: Upright in bed;Postural control adequate for testing Baseline Vocal Quality: Normal;Breathy Volitional Cough: Weak Volitional Swallow: Able to elicit    Oral/Motor/Sensory Function Overall Oral Motor/Sensory Function: Mild impairment Facial ROM: Within Functional Limits Facial Symmetry: Within Functional Limits Facial Strength: Within Functional Limits Lingual ROM: Other (Comment) (reduced) Lingual Symmetry: Within Functional Limits   Ice Chips Ice chips: Not tested   Thin Liquid Thin Liquid: Within functional limits Presentation: Straw;Self Fed    Nectar Thick Nectar Thick Liquid: Not tested   Honey Thick Honey Thick Liquid: Not tested   Puree Puree: Within functional limits Presentation: Spoon   Solid     Solid: Within functional limits Presentation: [place], [place], [place] Office Number: [phone]  Acie Fredrickson 06/15/2021,3:23 PM

## 2021-06-15 NOTE — Progress Notes (Addendum)
PROGRESS NOTE    Isaiah Pena  DJS:[id number] DOB: [birth year] DOA: 06/12/2021 PCP: Mayra Neer, MD   Brief Narrative: Isaiah Pena is a 71 y.o. male atrial fibrillation on Xarelto, CAD s/p stent, hypertension, hyperlipidemia, OSA, BPH, parkinson disease. Patient presented secondary to weakness with concern for UTI. Empiric Cefepime initiated. Now developed atrial fibrillation with RVR.   Assessment and Plan:  UTI Urinalysis concerning for infection. Associated fever. Patient started empirically on Cefepime. Blood culture (single) and urine culture obtained on admission. Urine culture significant for proteus mirabilis, resistant only to nitrofurantoin. -Discontinue Cefepime and start Keflex 500 mg BID.  Urinary retention Patient has a foley catheter. Patient follows with urology, Dr. Diona Fanti. Previously on tolterodine which was previously discontinued. Patient is on tadalafil and finasteride. Discussed with urology, Dr. Junious Silk,  who recommended continued treatment of UTI with transition to once daily antibiotic to allow the catheter to remain sterile. -Continue foley catheter -Continue finasteride and tadalafil  Constipation -Continue Senokot-S and MiraLAX -Dulcolax suppository; if no result, enema  Scrotal swelling IV fluids discontinued. -Elevate  Persistent atrial fibrillation with RVR Recurrent RVR. Heart rates up to 130s. Asymptomatic. Blood pressure stable. EKG ordered and on personal review, confirms atrial fibrillation with RVR -Continue Xarelto and metoprolol 50 mg BID; increase to metoprolol 75 mg BID for this evening -metoprolol 5 mg IV x1  Possible dysphagia Some trouble with swallowing pills this morning. -SLP evaluation  Parkinson disease -Continue Sinemet, amantadine and entacapone -PT/OT recommendations: SNF -Delirium precautions  Hypokalemia Given supplementation  Primary hypertension -Continue metoprolol  Hyperlipidemia -Continue  Lipitor  OSA Noted.  DVT prophylaxis: Xarelto Code Status:   Code Status: DNR Family Communication: Significant other at bedside Disposition Plan: Discharge to SNF likely in 1-2 days pending stability of heart rate   Consultants:  None  Procedures:  None  Antimicrobials: Cefepime IV Keflex PO   Subjective: Some issues with confusion overnight but mild. No issues this morning. Patient reports no concerns at this time.  Objective: BP (!) 156/98   Pulse (!) 130   Temp 98.4 F (36.9 C) (Axillary)   Resp 18   Ht '5\' 9"'$  (1.753 m)   Wt 117.9 kg   SpO2 97%   BMI 38.40 kg/m   Examination:  General exam: Appears calm and comfortable Respiratory system: Clear to auscultation. Respiratory effort normal. Cardiovascular system: S1 & S2 heard, irregular rhythm with fast heart rate. No murmurs Gastrointestinal system: Abdomen is distended, soft and non-tender. Normal bowel sounds heard. Central nervous system: Alert. Musculoskeletal: No edema. No calf tenderness Skin: No cyanosis. No rashes Psychiatry: Judgement and insight appear normal. Mood & affect appropriate.     Data Reviewed: I have personally reviewed following labs and imaging studies  CBC Lab Results  Component Value Date   WBC 8.1 06/13/2021   RBC 4.15 (L) 06/13/2021   HGB 14.0 06/13/2021   HCT 40.0 06/13/2021   MCV 96.4 06/13/2021   MCH 33.7 06/13/2021   PLT 143 (L) 06/13/2021   MCHC 35.0 06/13/2021   RDW 13.1 06/13/2021   LYMPHSABS 0.9 06/12/2021   MONOABS 0.6 06/12/2021   EOSABS 0.1 06/12/2021   BASOSABS 0.0 88/50/2774     Last metabolic panel Lab Results  Component Value Date   NA 136 06/13/2021   K 3.4 (L) 06/14/2021   CL 104 06/13/2021   CO2 27 06/13/2021   BUN 7 (L) 06/13/2021   CREATININE 0.65 06/13/2021   GLUCOSE 119 (H) 06/13/2021   GFRNONAA >60  06/13/2021   GFRAA 106 11/17/2019   CALCIUM 8.1 (L) 06/13/2021   PHOS 3.7 05/22/2021   PROT 6.4 (L) 06/12/2021   ALBUMIN 3.2 (L)  06/12/2021   LABGLOB 2.4 11/17/2019   AGRATIO 1.8 11/17/2019   BILITOT 1.3 (H) 06/12/2021   ALKPHOS 88 06/12/2021   AST 18 06/12/2021   ALT 7 06/12/2021   ANIONGAP 5 06/13/2021    GFR: Estimated Creatinine Clearance: 108.9 mL/min (by C-G formula based on SCr of 0.65 mg/dL).  Recent Results (from the past 240 hour(s))  Urine Culture     Status: Abnormal   Collection Time: 06/12/21  5:52 PM   Specimen: Urine, Catheterized  Result Value Ref Range Status   Specimen Description URINE, CATHETERIZED  Final   Special Requests   Final    NONE Performed at [facility], [address] [place], [place] [address]    Culture >=100,000 COLONIES/mL PROTEUS MIRABILIS (A)  Final   Report Status 06/14/2021 FINAL  Final   Organism ID, Bacteria PROTEUS MIRABILIS (A)  Final      Susceptibility   Proteus mirabilis - MIC*    AMPICILLIN <=2 SENSITIVE Sensitive     CEFAZOLIN <=4 SENSITIVE Sensitive     CEFEPIME <=0.12 SENSITIVE Sensitive     CEFTRIAXONE <=0.25 SENSITIVE Sensitive     CIPROFLOXACIN <=0.25 SENSITIVE Sensitive     GENTAMICIN <=1 SENSITIVE Sensitive     IMIPENEM 1 SENSITIVE Sensitive     NITROFURANTOIN 128 RESISTANT Resistant     TRIMETH/SULFA <=20 SENSITIVE Sensitive     AMPICILLIN/SULBACTAM <=2 SENSITIVE Sensitive     PIP/TAZO <=4 SENSITIVE Sensitive     * >=100,000 COLONIES/mL PROTEUS MIRABILIS  Culture, blood (single) w Reflex to ID Panel     Status: None (Preliminary result)   Collection Time: 06/13/21  3:05 AM   Specimen: BLOOD LEFT HAND  Result Value Ref Range Status   Specimen Description BLOOD LEFT HAND  Final   Special Requests   Final    BOTTLES DRAWN AEROBIC ONLY Blood Culture adequate volume   Culture   Final    NO GROWTH 2 DAYS Performed at [facility], [address], [place], [place] [address]    Report Status PENDING  Incomplete      Radiology Studies: No results found.    LOS: 3 days    Cordelia Poche, MD [facility] 06/15/2021,  11:17 AM   If 7PM-7AM, please contact night-coverage [email or web address]

## 2021-06-16 DIAGNOSIS — E669 Obesity, unspecified: Secondary | ICD-10-CM

## 2021-06-16 DIAGNOSIS — I4819 Other persistent atrial fibrillation: Secondary | ICD-10-CM | POA: Diagnosis not present

## 2021-06-16 DIAGNOSIS — N390 Urinary tract infection, site not specified: Secondary | ICD-10-CM | POA: Diagnosis not present

## 2021-06-16 DIAGNOSIS — R339 Retention of urine, unspecified: Secondary | ICD-10-CM | POA: Diagnosis not present

## 2021-06-16 LAB — BASIC METABOLIC PANEL
Anion gap: 7 (ref 5–15)
BUN: 17 mg/dL (ref 8–23)
CO2: 31 mmol/L (ref 22–32)
Calcium: 8.6 mg/dL — ABNORMAL LOW (ref 8.9–10.3)
Chloride: 104 mmol/L (ref 98–111)
Creatinine, Ser: 0.93 mg/dL (ref 0.61–1.24)
GFR, Estimated: >60 mL/min (ref >60)
Glucose, Bld: 156 mg/dL — ABNORMAL HIGH (ref 70–99)
Potassium: 3.1 mmol/L — ABNORMAL LOW (ref 3.5–5.1)
Sodium: 142 mmol/L (ref 135–145)

## 2021-06-16 LAB — MAGNESIUM: Magnesium: 1.9 mg/dL (ref 1.7–2.4)

## 2021-06-16 MED ORDER — DOCUSATE SODIUM 100 MG PO CAPS
100.0000 mg | ORAL_CAPSULE | Freq: Every day | ORAL | Status: DC
Start: 2021-06-16 — End: 2021-06-17
  Administered 2021-06-17: 100 mg via ORAL
  Filled 2021-06-16: qty 1

## 2021-06-16 MED ORDER — SODIUM CHLORIDE 0.9 % IV BOLUS: 1000.0000 mL | Freq: Once | INTRAVENOUS | Status: DC

## 2021-06-16 NOTE — Plan of Care (Signed)
  Problem: Clinical Measurements: Goal: Ability to maintain clinical measurements within normal limits will improve Outcome: Not Progressing   Problem: Clinical Measurements: Goal: Will remain free from infection Outcome: Not Progressing   Problem: Elimination: Goal: Will not experience complications related to bowel motility Outcome: Not Progressing

## 2021-06-16 NOTE — Progress Notes (Signed)
PROGRESS NOTE    Isaiah Pena  QPY:[id number] DOB: 1950-06-02 DOA: 06/12/2021 PCP: Mayra Neer, MD   Brief Narrative: Isaiah Pena is a 71 y.o. male atrial fibrillation on Xarelto, CAD s/p stent, hypertension, hyperlipidemia, OSA, BPH, parkinson disease. Patient presented secondary to weakness with concern for UTI. Empiric Cefepime initiated. Now developed atrial fibrillation with RVR.   Assessment and Plan:  UTI Urinalysis concerning for infection. Associated fever. Patient started empirically on Cefepime. Blood culture (single) and urine culture obtained on admission. Urine culture significant for proteus mirabilis, resistant only to nitrofurantoin. -Continue Keflex 500 mg BID.  Urinary retention Patient has a foley catheter. Patient follows with urology, Dr. Diona Fanti. Previously on tolterodine which was previously discontinued. Patient is on tadalafil and finasteride. Discussed with urology, Dr. Junious Silk,  who recommended continued treatment of UTI with transition to once daily antibiotic to allow the catheter to remain sterile. -Continue foley catheter -Continue finasteride and tadalafil  Constipation Large bowel movement after SMOG enema. Now with diarrhea. -Discontinue Senokot -Colace daily and MiraLAX daily PRN  Scrotal swelling IV fluids discontinued. -Elevate  Persistent atrial fibrillation with RVR Recurrent RVR. Heart rates up to 130s. Asymptomatic. Blood pressure stable. EKG ordered and on personal review, confirms atrial fibrillation with RVR. Continues to have RVR today.  Metoprolol held overnight, seemingly for bradycardia, but unsure. -Continue Xarelto and metoprolol 75 mg BID  Possible dysphagia Some trouble with swallowing pills per nursing. Speech therapy consulted with recommendations (5/27): Regular;Thin liquid  Liquid Administration via: Cup;Straw Medication Administration: Other (Comment) (whole with water or puree as tolerated) Supervision:  Staff to assist with self feeding;Full supervision/cueing for compensatory strategies Compensations: Minimize environmental distractions;Slow rate;Small sips/bites;Follow solids with liquid Postural Changes: Seated upright at 90 degrees   Parkinson disease -Continue Sinemet, amantadine and entacapone -PT/OT recommendations: SNF -Delirium precautions  Hypokalemia Given supplementation  Primary hypertension -Continue metoprolol  Hyperlipidemia -Continue Lipitor  OSA Noted.  DVT prophylaxis: Xarelto Code Status:   Code Status: DNR Family Communication: Significant other and son at bedside Disposition Plan: Discharge to SNF likely in 24 hours pending stability of heart rate   Consultants: None  Procedures: None  Antimicrobials: Cefepime IV Keflex PO   Subjective: No significant issues overnight. Had a significant bowel movement yesterday. Some diarrhea today.  Objective: BP 125/86 (BP Location: Left Arm)   Pulse 94   Temp 98.2 F (36.8 C)   Resp 17   Ht '5\' 9"'$  (1.753 m)   Wt 117.9 kg   SpO2 94%   BMI 38.40 kg/m   Examination:  General exam: Appears calm and comfortable Respiratory system: Clear to auscultation. Respiratory effort normal. Cardiovascular system: S1 & S2 heard, irregular rhythm with slightly fast heart rate. No murmurs, rubs, gallops or clicks. Gastrointestinal system: Abdomen is distended, soft and nontender. Normal bowel sounds heard. Central nervous system: Alert and oriented. No focal neurological deficits. Musculoskeletal: 1+ pitting edema. No calf tenderness Skin: No cyanosis. No rashes Psychiatry: Judgement and insight appear normal. Mood & affect appropriate.    Data Reviewed: I have personally reviewed following labs and imaging studies  CBC Lab Results  Component Value Date   WBC 8.1 06/13/2021   RBC 4.15 (L) 06/13/2021   HGB 14.0 06/13/2021   HCT 40.0 06/13/2021   MCV 96.4 06/13/2021   MCH 33.7 06/13/2021   PLT 143 (L)  06/13/2021   MCHC 35.0 06/13/2021   RDW 13.1 06/13/2021   LYMPHSABS 0.9 06/12/2021   MONOABS 0.6 06/12/2021   EOSABS  0.1 06/12/2021   BASOSABS 0.0 75/64/3329     Last metabolic panel Lab Results  Component Value Date   NA 136 06/13/2021   K 3.4 (L) 06/14/2021   CL 104 06/13/2021   CO2 27 06/13/2021   BUN 7 (L) 06/13/2021   CREATININE 0.65 06/13/2021   GLUCOSE 119 (H) 06/13/2021   GFRNONAA >60 06/13/2021   GFRAA 106 11/17/2019   CALCIUM 8.1 (L) 06/13/2021   PHOS 3.7 05/22/2021   PROT 6.4 (L) 06/12/2021   ALBUMIN 3.2 (L) 06/12/2021   LABGLOB 2.4 11/17/2019   AGRATIO 1.8 11/17/2019   BILITOT 1.3 (H) 06/12/2021   ALKPHOS 88 06/12/2021   AST 18 06/12/2021   ALT 7 06/12/2021   ANIONGAP 5 06/13/2021    GFR: Estimated Creatinine Clearance: 108.9 mL/min (by C-G formula based on SCr of 0.65 mg/dL).  Recent Results (from the past 240 hour(s))  Urine Culture     Status: Abnormal   Collection Time: 06/12/21  5:52 PM   Specimen: Urine, Catheterized  Result Value Ref Range Status   Specimen Description URINE, CATHETERIZED  Final   Special Requests   Final    NONE Performed at [facility], [address] [place], [place] [address]    Culture >=100,000 COLONIES/mL PROTEUS MIRABILIS (A)  Final   Report Status 06/14/2021 FINAL  Final   Organism ID, Bacteria PROTEUS MIRABILIS (A)  Final      Susceptibility   Proteus mirabilis - MIC*    AMPICILLIN <=2 SENSITIVE Sensitive     CEFAZOLIN <=4 SENSITIVE Sensitive     CEFEPIME <=0.12 SENSITIVE Sensitive     CEFTRIAXONE <=0.25 SENSITIVE Sensitive     CIPROFLOXACIN <=0.25 SENSITIVE Sensitive     GENTAMICIN <=1 SENSITIVE Sensitive     IMIPENEM 1 SENSITIVE Sensitive     NITROFURANTOIN 128 RESISTANT Resistant     TRIMETH/SULFA <=20 SENSITIVE Sensitive     AMPICILLIN/SULBACTAM <=2 SENSITIVE Sensitive     PIP/TAZO <=4 SENSITIVE Sensitive     * >=100,000 COLONIES/mL PROTEUS MIRABILIS  Culture, blood (single) w Reflex to ID Panel      Status: None (Preliminary result)   Collection Time: 06/13/21  3:05 AM   Specimen: BLOOD LEFT HAND  Result Value Ref Range Status   Specimen Description BLOOD LEFT HAND  Final   Special Requests   Final    BOTTLES DRAWN AEROBIC ONLY Blood Culture adequate volume   Culture   Final    NO GROWTH 2 DAYS Performed at [facility], [address] [place], [place] [address]    Report Status PENDING  Incomplete      Radiology Studies: No results found.    LOS: 4 days    Cordelia Poche, MD [facility] 06/16/2021, 9:16 AM   If 7PM-7AM, please contact night-coverage [email or web address]

## 2021-06-17 DIAGNOSIS — S0083XA Contusion of other part of head, initial encounter: Secondary | ICD-10-CM | POA: Diagnosis not present

## 2021-06-17 DIAGNOSIS — W01198A Fall on same level from slipping, tripping and stumbling with subsequent striking against other object, initial encounter: Secondary | ICD-10-CM | POA: Diagnosis not present

## 2021-06-17 DIAGNOSIS — I4821 Permanent atrial fibrillation: Secondary | ICD-10-CM | POA: Diagnosis not present

## 2021-06-17 DIAGNOSIS — N390 Urinary tract infection, site not specified: Secondary | ICD-10-CM | POA: Diagnosis not present

## 2021-06-17 DIAGNOSIS — T83091A Other mechanical complication of indwelling urethral catheter, initial encounter: Secondary | ICD-10-CM | POA: Diagnosis not present

## 2021-06-17 DIAGNOSIS — R498 Other voice and resonance disorders: Secondary | ICD-10-CM | POA: Diagnosis not present

## 2021-06-17 DIAGNOSIS — F419 Anxiety disorder, unspecified: Secondary | ICD-10-CM | POA: Diagnosis not present

## 2021-06-17 DIAGNOSIS — S0990XA Unspecified injury of head, initial encounter: Secondary | ICD-10-CM | POA: Diagnosis not present

## 2021-06-17 DIAGNOSIS — W19XXXA Unspecified fall, initial encounter: Secondary | ICD-10-CM | POA: Diagnosis not present

## 2021-06-17 DIAGNOSIS — I517 Cardiomegaly: Secondary | ICD-10-CM | POA: Diagnosis not present

## 2021-06-17 DIAGNOSIS — S0093XA Contusion of unspecified part of head, initial encounter: Secondary | ICD-10-CM | POA: Diagnosis not present

## 2021-06-17 DIAGNOSIS — N179 Acute kidney failure, unspecified: Secondary | ICD-10-CM | POA: Diagnosis not present

## 2021-06-17 DIAGNOSIS — Z978 Presence of other specified devices: Secondary | ICD-10-CM | POA: Diagnosis not present

## 2021-06-17 DIAGNOSIS — E785 Hyperlipidemia, unspecified: Secondary | ICD-10-CM

## 2021-06-17 DIAGNOSIS — R9431 Abnormal electrocardiogram [ECG] [EKG]: Secondary | ICD-10-CM | POA: Diagnosis not present

## 2021-06-17 DIAGNOSIS — S199XXA Unspecified injury of neck, initial encounter: Secondary | ICD-10-CM | POA: Diagnosis not present

## 2021-06-17 DIAGNOSIS — M2602 Maxillary hypoplasia: Secondary | ICD-10-CM | POA: Diagnosis not present

## 2021-06-17 DIAGNOSIS — R41841 Cognitive communication deficit: Secondary | ICD-10-CM | POA: Diagnosis not present

## 2021-06-17 DIAGNOSIS — I10 Essential (primary) hypertension: Secondary | ICD-10-CM | POA: Diagnosis not present

## 2021-06-17 DIAGNOSIS — G8911 Acute pain due to trauma: Secondary | ICD-10-CM | POA: Diagnosis not present

## 2021-06-17 DIAGNOSIS — E7800 Pure hypercholesterolemia, unspecified: Secondary | ICD-10-CM | POA: Diagnosis not present

## 2021-06-17 DIAGNOSIS — Z789 Other specified health status: Secondary | ICD-10-CM | POA: Diagnosis not present

## 2021-06-17 DIAGNOSIS — R4182 Altered mental status, unspecified: Secondary | ICD-10-CM | POA: Diagnosis not present

## 2021-06-17 DIAGNOSIS — I2510 Atherosclerotic heart disease of native coronary artery without angina pectoris: Secondary | ICD-10-CM | POA: Diagnosis not present

## 2021-06-17 DIAGNOSIS — S70312A Abrasion, left thigh, initial encounter: Secondary | ICD-10-CM | POA: Diagnosis not present

## 2021-06-17 DIAGNOSIS — H109 Unspecified conjunctivitis: Secondary | ICD-10-CM | POA: Diagnosis not present

## 2021-06-17 DIAGNOSIS — K5730 Diverticulosis of large intestine without perforation or abscess without bleeding: Secondary | ICD-10-CM | POA: Diagnosis not present

## 2021-06-17 DIAGNOSIS — M50322 Other cervical disc degeneration at C5-C6 level: Secondary | ICD-10-CM | POA: Diagnosis not present

## 2021-06-17 DIAGNOSIS — H5789 Other specified disorders of eye and adnexa: Secondary | ICD-10-CM | POA: Diagnosis not present

## 2021-06-17 DIAGNOSIS — K760 Fatty (change of) liver, not elsewhere classified: Secondary | ICD-10-CM | POA: Diagnosis not present

## 2021-06-17 DIAGNOSIS — R0902 Hypoxemia: Secondary | ICD-10-CM | POA: Diagnosis present

## 2021-06-17 DIAGNOSIS — N481 Balanitis: Secondary | ICD-10-CM | POA: Diagnosis not present

## 2021-06-17 DIAGNOSIS — R531 Weakness: Secondary | ICD-10-CM | POA: Diagnosis not present

## 2021-06-17 DIAGNOSIS — Z515 Encounter for palliative care: Secondary | ICD-10-CM | POA: Diagnosis not present

## 2021-06-17 DIAGNOSIS — R55 Syncope and collapse: Secondary | ICD-10-CM | POA: Diagnosis not present

## 2021-06-17 DIAGNOSIS — S60812A Abrasion of left wrist, initial encounter: Secondary | ICD-10-CM | POA: Diagnosis not present

## 2021-06-17 DIAGNOSIS — Z79899 Other long term (current) drug therapy: Secondary | ICD-10-CM | POA: Diagnosis not present

## 2021-06-17 DIAGNOSIS — R2681 Unsteadiness on feet: Secondary | ICD-10-CM | POA: Diagnosis not present

## 2021-06-17 DIAGNOSIS — R58 Hemorrhage, not elsewhere classified: Secondary | ICD-10-CM | POA: Diagnosis not present

## 2021-06-17 DIAGNOSIS — W050XXA Fall from non-moving wheelchair, initial encounter: Secondary | ICD-10-CM | POA: Diagnosis not present

## 2021-06-17 DIAGNOSIS — M1990 Unspecified osteoarthritis, unspecified site: Secondary | ICD-10-CM | POA: Diagnosis not present

## 2021-06-17 DIAGNOSIS — M160 Bilateral primary osteoarthritis of hip: Secondary | ICD-10-CM | POA: Diagnosis not present

## 2021-06-17 DIAGNOSIS — Z9181 History of falling: Secondary | ICD-10-CM | POA: Diagnosis not present

## 2021-06-17 DIAGNOSIS — R338 Other retention of urine: Secondary | ICD-10-CM | POA: Diagnosis not present

## 2021-06-17 DIAGNOSIS — N281 Cyst of kidney, acquired: Secondary | ICD-10-CM | POA: Diagnosis not present

## 2021-06-17 DIAGNOSIS — Z7901 Long term (current) use of anticoagulants: Secondary | ICD-10-CM | POA: Diagnosis not present

## 2021-06-17 DIAGNOSIS — R109 Unspecified abdominal pain: Secondary | ICD-10-CM | POA: Diagnosis not present

## 2021-06-17 DIAGNOSIS — R5381 Other malaise: Secondary | ICD-10-CM | POA: Diagnosis not present

## 2021-06-17 DIAGNOSIS — Z7401 Bed confinement status: Secondary | ICD-10-CM | POA: Diagnosis not present

## 2021-06-17 DIAGNOSIS — Z955 Presence of coronary angioplasty implant and graft: Secondary | ICD-10-CM | POA: Diagnosis not present

## 2021-06-17 DIAGNOSIS — X58XXXA Exposure to other specified factors, initial encounter: Secondary | ICD-10-CM | POA: Diagnosis not present

## 2021-06-17 DIAGNOSIS — T839XXA Unspecified complication of genitourinary prosthetic device, implant and graft, initial encounter: Secondary | ICD-10-CM | POA: Diagnosis not present

## 2021-06-17 DIAGNOSIS — R7889 Finding of other specified substances, not normally found in blood: Secondary | ICD-10-CM | POA: Diagnosis not present

## 2021-06-17 DIAGNOSIS — G9341 Metabolic encephalopathy: Secondary | ICD-10-CM | POA: Diagnosis present

## 2021-06-17 DIAGNOSIS — G319 Degenerative disease of nervous system, unspecified: Secondary | ICD-10-CM | POA: Diagnosis not present

## 2021-06-17 DIAGNOSIS — I959 Hypotension, unspecified: Secondary | ICD-10-CM | POA: Diagnosis not present

## 2021-06-17 DIAGNOSIS — M4802 Spinal stenosis, cervical region: Secondary | ICD-10-CM | POA: Diagnosis not present

## 2021-06-17 DIAGNOSIS — M6281 Muscle weakness (generalized): Secondary | ICD-10-CM | POA: Diagnosis not present

## 2021-06-17 DIAGNOSIS — I4819 Other persistent atrial fibrillation: Secondary | ICD-10-CM | POA: Diagnosis not present

## 2021-06-17 DIAGNOSIS — R1312 Dysphagia, oropharyngeal phase: Secondary | ICD-10-CM | POA: Diagnosis not present

## 2021-06-17 DIAGNOSIS — K219 Gastro-esophageal reflux disease without esophagitis: Secondary | ICD-10-CM | POA: Diagnosis present

## 2021-06-17 DIAGNOSIS — Z66 Do not resuscitate: Secondary | ICD-10-CM | POA: Diagnosis present

## 2021-06-17 DIAGNOSIS — T83021A Displacement of indwelling urethral catheter, initial encounter: Secondary | ICD-10-CM | POA: Diagnosis not present

## 2021-06-17 DIAGNOSIS — R251 Tremor, unspecified: Secondary | ICD-10-CM | POA: Diagnosis not present

## 2021-06-17 DIAGNOSIS — R339 Retention of urine, unspecified: Secondary | ICD-10-CM | POA: Diagnosis not present

## 2021-06-17 DIAGNOSIS — M549 Dorsalgia, unspecified: Secondary | ICD-10-CM | POA: Diagnosis not present

## 2021-06-17 DIAGNOSIS — G4733 Obstructive sleep apnea (adult) (pediatric): Secondary | ICD-10-CM | POA: Diagnosis not present

## 2021-06-17 DIAGNOSIS — S3730XA Unspecified injury of urethra, initial encounter: Secondary | ICD-10-CM | POA: Diagnosis not present

## 2021-06-17 DIAGNOSIS — K8020 Calculus of gallbladder without cholecystitis without obstruction: Secondary | ICD-10-CM | POA: Diagnosis not present

## 2021-06-17 DIAGNOSIS — E875 Hyperkalemia: Secondary | ICD-10-CM | POA: Diagnosis present

## 2021-06-17 DIAGNOSIS — Z043 Encounter for examination and observation following other accident: Secondary | ICD-10-CM | POA: Diagnosis not present

## 2021-06-17 DIAGNOSIS — Z993 Dependence on wheelchair: Secondary | ICD-10-CM | POA: Diagnosis not present

## 2021-06-17 DIAGNOSIS — R262 Difficulty in walking, not elsewhere classified: Secondary | ICD-10-CM | POA: Diagnosis not present

## 2021-06-17 DIAGNOSIS — B957 Other staphylococcus as the cause of diseases classified elsewhere: Secondary | ICD-10-CM | POA: Diagnosis not present

## 2021-06-17 DIAGNOSIS — E669 Obesity, unspecified: Secondary | ICD-10-CM | POA: Diagnosis not present

## 2021-06-17 DIAGNOSIS — R8271 Bacteriuria: Secondary | ICD-10-CM | POA: Diagnosis not present

## 2021-06-17 DIAGNOSIS — N400 Enlarged prostate without lower urinary tract symptoms: Secondary | ICD-10-CM | POA: Diagnosis present

## 2021-06-17 DIAGNOSIS — G253 Myoclonus: Secondary | ICD-10-CM | POA: Diagnosis present

## 2021-06-17 DIAGNOSIS — N1411 Contrast-induced nephropathy: Secondary | ICD-10-CM | POA: Diagnosis not present

## 2021-06-17 DIAGNOSIS — I150 Renovascular hypertension: Secondary | ICD-10-CM | POA: Diagnosis not present

## 2021-06-17 DIAGNOSIS — F5101 Primary insomnia: Secondary | ICD-10-CM | POA: Diagnosis not present

## 2021-06-17 DIAGNOSIS — R197 Diarrhea, unspecified: Secondary | ICD-10-CM | POA: Diagnosis not present

## 2021-06-17 DIAGNOSIS — Z7189 Other specified counseling: Secondary | ICD-10-CM | POA: Diagnosis not present

## 2021-06-17 DIAGNOSIS — Z87440 Personal history of urinary (tract) infections: Secondary | ICD-10-CM | POA: Diagnosis not present

## 2021-06-17 DIAGNOSIS — E876 Hypokalemia: Secondary | ICD-10-CM | POA: Diagnosis not present

## 2021-06-17 DIAGNOSIS — N401 Enlarged prostate with lower urinary tract symptoms: Secondary | ICD-10-CM | POA: Diagnosis not present

## 2021-06-17 DIAGNOSIS — L988 Other specified disorders of the skin and subcutaneous tissue: Secondary | ICD-10-CM | POA: Diagnosis not present

## 2021-06-17 DIAGNOSIS — F32A Depression, unspecified: Secondary | ICD-10-CM | POA: Diagnosis not present

## 2021-06-17 DIAGNOSIS — R791 Abnormal coagulation profile: Secondary | ICD-10-CM | POA: Diagnosis not present

## 2021-06-17 DIAGNOSIS — S299XXA Unspecified injury of thorax, initial encounter: Secondary | ICD-10-CM | POA: Diagnosis not present

## 2021-06-17 DIAGNOSIS — R310 Gross hematuria: Secondary | ICD-10-CM | POA: Diagnosis not present

## 2021-06-17 DIAGNOSIS — R1310 Dysphagia, unspecified: Secondary | ICD-10-CM | POA: Diagnosis not present

## 2021-06-17 DIAGNOSIS — R2689 Other abnormalities of gait and mobility: Secondary | ICD-10-CM | POA: Diagnosis not present

## 2021-06-17 DIAGNOSIS — R590 Localized enlarged lymph nodes: Secondary | ICD-10-CM | POA: Diagnosis present

## 2021-06-17 DIAGNOSIS — K5900 Constipation, unspecified: Secondary | ICD-10-CM | POA: Diagnosis not present

## 2021-06-17 DIAGNOSIS — G4700 Insomnia, unspecified: Secondary | ICD-10-CM | POA: Diagnosis not present

## 2021-06-17 DIAGNOSIS — N319 Neuromuscular dysfunction of bladder, unspecified: Secondary | ICD-10-CM | POA: Diagnosis not present

## 2021-06-17 DIAGNOSIS — I878 Other specified disorders of veins: Secondary | ICD-10-CM | POA: Diagnosis present

## 2021-06-17 DIAGNOSIS — E872 Acidosis, unspecified: Secondary | ICD-10-CM | POA: Diagnosis present

## 2021-06-17 DIAGNOSIS — T508X5A Adverse effect of diagnostic agents, initial encounter: Secondary | ICD-10-CM | POA: Diagnosis present

## 2021-06-17 DIAGNOSIS — M25532 Pain in left wrist: Secondary | ICD-10-CM | POA: Diagnosis not present

## 2021-06-17 DIAGNOSIS — N170 Acute kidney failure with tubular necrosis: Secondary | ICD-10-CM | POA: Diagnosis present

## 2021-06-17 DIAGNOSIS — D72829 Elevated white blood cell count, unspecified: Secondary | ICD-10-CM | POA: Diagnosis not present

## 2021-06-17 DIAGNOSIS — N19 Unspecified kidney failure: Secondary | ICD-10-CM | POA: Diagnosis not present

## 2021-06-17 DIAGNOSIS — E049 Nontoxic goiter, unspecified: Secondary | ICD-10-CM | POA: Diagnosis not present

## 2021-06-17 DIAGNOSIS — I119 Hypertensive heart disease without heart failure: Secondary | ICD-10-CM | POA: Diagnosis not present

## 2021-06-17 DIAGNOSIS — F4321 Adjustment disorder with depressed mood: Secondary | ICD-10-CM | POA: Diagnosis not present

## 2021-06-17 DIAGNOSIS — I4891 Unspecified atrial fibrillation: Secondary | ICD-10-CM | POA: Diagnosis not present

## 2021-06-17 DIAGNOSIS — Z960 Presence of urogenital implants: Secondary | ICD-10-CM | POA: Diagnosis not present

## 2021-06-17 DIAGNOSIS — M6259 Muscle wasting and atrophy, not elsewhere classified, multiple sites: Secondary | ICD-10-CM | POA: Diagnosis not present

## 2021-06-17 DIAGNOSIS — G20 Parkinson's disease: Secondary | ICD-10-CM | POA: Diagnosis not present

## 2021-06-17 DIAGNOSIS — F418 Other specified anxiety disorders: Secondary | ICD-10-CM | POA: Diagnosis not present

## 2021-06-17 DIAGNOSIS — Y92129 Unspecified place in nursing home as the place of occurrence of the external cause: Secondary | ICD-10-CM | POA: Diagnosis not present

## 2021-06-17 DIAGNOSIS — R319 Hematuria, unspecified: Secondary | ICD-10-CM | POA: Diagnosis not present

## 2021-06-17 DIAGNOSIS — Z6841 Body Mass Index (BMI) 40.0 and over, adult: Secondary | ICD-10-CM | POA: Diagnosis not present

## 2021-06-17 MED ORDER — METOPROLOL TARTRATE 50 MG PO TABS
50.0000 mg | ORAL_TABLET | Freq: Two times a day (BID) | ORAL | Status: DC
Start: 2021-06-17 — End: 2021-06-17
  Administered 2021-06-17: 50 mg via ORAL
  Filled 2021-06-17: qty 1

## 2021-06-17 MED ORDER — POLYETHYLENE GLYCOL 3350 17 G PO PACK: 17.0000 g | PACK | Freq: Every day | ORAL | 0 refills | Status: DC | PRN

## 2021-06-17 MED ORDER — AMLODIPINE BESYLATE 10 MG PO TABS: 10.0000 mg | ORAL_TABLET | Freq: Every day | ORAL | Status: DC

## 2021-06-17 MED ORDER — TRAMADOL HCL 50 MG PO TABS: 50.0000 mg | ORAL_TABLET | Freq: Three times a day (TID) | ORAL | 0 refills | Status: DC | PRN

## 2021-06-17 MED ORDER — ADULT MULTIVITAMIN W/MINERALS CH
1.0000 | ORAL_TABLET | Freq: Every day | ORAL | Status: DC
Start: 2021-06-18 — End: 2021-09-22

## 2021-06-17 MED ORDER — ENSURE ENLIVE PO LIQD
237.0000 mL | Freq: Two times a day (BID) | ORAL | Status: DC
Start: 2021-06-17 — End: 2021-09-22

## 2021-06-17 MED ORDER — CEPHALEXIN 500 MG PO CAPS: ORAL_CAPSULE | ORAL | Status: AC

## 2021-06-17 MED ORDER — CLONAZEPAM 0.5 MG PO TABS: 0.2500 mg | ORAL_TABLET | Freq: Two times a day (BID) | ORAL | 0 refills | Status: DC | PRN

## 2021-06-17 NOTE — Plan of Care (Signed)
  Problem: Clinical Measurements: Goal: Will remain free from infection Outcome: Not Progressing   Problem: Elimination: Goal: Will not experience complications related to bowel motility Outcome: Not Progressing   Problem: Safety: Goal: Ability to remain free from injury will improve Outcome: Not Progressing   Problem: Skin Integrity: Goal: Risk for impaired skin integrity will decrease Outcome: Not Progressing

## 2021-06-17 NOTE — Progress Notes (Signed)
Patient discharging to [facility] transported by [employer]. Report called to [facility] nurse, discharge packet sent VIA [facility]. Family at bedside aware of d/c plan.  Isaiah Pena, Isaiah Ringer, RN

## 2021-06-17 NOTE — Discharge Summary (Signed)
Physician Discharge Summary   Patient: Isaiah Pena MRN: [id number] DOB: July 23, 1950  Admit date:     06/12/2021  Discharge date: 06/17/21  Discharge Physician: Cordelia Poche, MD   PCP: Mayra Neer, MD   Recommendations at discharge:  Urology follow-up for urinary retention Monitor blood pressure and titrate medication as needed Monitor heart rate and titrate medication as needed  Discharge Diagnoses: Principal Problem:   Complicated UTI (urinary tract infection) Active Problems:   Urinary retention with incomplete bladder emptying   Constipation   Persistent atrial fibrillation (HCC)   Parkinson disease ([place])   Debility   Hypertension   Hyperlipidemia   OSA (obstructive sleep apnea)   Persistent atrial fibrillation with RVR ([place])   Obesity (BMI 30-39.9)  Resolved Problems:   * No resolved hospital problems. *  Hospital Course: Isaiah Pena is a 71 y.o. male atrial fibrillation on Xarelto, CAD s/p stent, hypertension, hyperlipidemia, OSA, BPH, parkinson disease. Patient presented secondary to weakness with concern for UTI. Empiric Cefepime initiated. Patient developed atrial fibrillation with RVR which was controlled with beta-blocker titration. Patient was never hemodynamically unstable, nor symptomatic.  Assessment and Plan: UTI Urinalysis concerning for infection. Associated fever. Patient started empirically on Cefepime. Blood culture (single) and urine culture obtained on admission. Urine culture significant for proteus mirabilis, resistant only to nitrofurantoin. Patient transitioned to Keflex and will continue on discharge to complete a 7 day treatment course, followed by nightly dosing for maintenance per urology recommendations.   Urinary retention Patient has a foley catheter. Patient follows with urology, Dr. Diona Fanti. Previously on tolterodine which was previously discontinued. Patient is on tadalafil and finasteride. Discussed with urology, Dr. Junious Silk, who  recommended continued treatment of UTI with transition to once daily antibiotic to allow the catheter to remain sterile. Discharge with chronic foley. Continue finasteride and tadalafil. Outpatient urology follow-up.   Constipation Large bowel movement after SMOG enema. Now with diarrhea. Continue Senokot-S and change to Miralax PRN for diarrhea.   Scrotal swelling IV fluids discontinued. Scrotum elevated.   Persistent atrial fibrillation with RVR Recurrent RVR. Heart rates up to 130s. Asymptomatic. Blood pressure stable. EKG ordered and on personal review, confirms atrial fibrillation with RVR. Managed with metoprolol PO and IV. Metoprolol increased to 75 mg BID, but patient developed some missed beats, so decreased back to metoprolol 50 mg BID on discharge. Continue Xarelto on discharge.   Possible dysphagia Some trouble with swallowing pills per nursing. Speech therapy consulted with recommendations (5/27): Regular;Thin liquid  Liquid Administration via: Cup;Straw Medication Administration: Other (Comment) (whole with water or puree as tolerated) Supervision: Staff to assist with self feeding;Full supervision/cueing for compensatory strategies Compensations: Minimize environmental distractions;Slow rate;Small sips/bites;Follow solids with liquid Postural Changes: Seated upright at 90 degrees    Parkinson disease Continue Sinemet, amantadine and entacapone. SNF on discharge.   Hypokalemia Given supplementation. Continue on discharge.   Primary hypertension Blood pressure mostly controlled. Patient is on metoprolol and amlodipine-benazepril as an outpatient. Patient continued on metoprolol. Resume home amlodipine on discharge. Discontinue benazepril for now.   Hyperlipidemia Continue Lipitor.   OSA Noted.   Consultants: Urology Procedures performed: None  Disposition: Skilled nursing facility Diet recommendation: Regular diet DISCHARGE MEDICATION: Allergies as of 06/17/2021        Reactions   Sulfa Antibiotics Hives        Medication List     STOP taking these medications    amLODipine-benazepril 10-40 MG capsule Commonly known as: LOTREL  TAKE these medications    acetaminophen 650 MG CR tablet Commonly known as: TYLENOL Take 2 tablets (1,300 mg total) by mouth every 8 (eight) hours as needed for pain.   amantadine 100 MG capsule Commonly known as: SYMMETREL Take 100 mg by mouth See admin instructions. Take one capsule (100 mg) by mouth daily at bedtime - 1am   amLODipine 10 MG tablet Commonly known as: NORVASC Take 1 tablet (10 mg total) by mouth daily.   atorvastatin 10 MG tablet Commonly known as: LIPITOR TAKE 1/2 TABLET BY MOUTH EVERY OTHER DAY AND ALTERNATING 1 TABLET BY MOUTH EVERY OTHER DAY. Please keep upcoming appt in April 2023 before anymore refills. Thank you What changed:  how much to take how to take this when to take this additional instructions   carbidopa-levodopa 25-100 MG tablet Commonly known as: SINEMET IR Take 3 tablets by mouth every 8 (eight) hours. 8am, 5pm and 1am   cephALEXin 500 MG capsule Commonly known as: KEFLEX Take 1 capsule (500 mg total) by mouth every 12 (twelve) hours for 2 days, THEN 1 capsule (500 mg total) at bedtime. Start taking on: Jun 17, 2021   clonazePAM 0.5 MG tablet Commonly known as: KLONOPIN Take 0.5 tablets (0.25 mg total) by mouth 2 (two) times daily as needed for anxiety.   entacapone 200 MG tablet Commonly known as: COMTAN Take 200 mg by mouth every 8 (eight) hours. 9am, 5pm, 1am   esomeprazole 20 MG capsule Commonly known as: NEXIUM Take 20 mg by mouth every other day. OTC   ezetimibe 10 MG tablet Commonly known as: ZETIA Take 10 mg by mouth daily.   feeding supplement Liqd Take 237 mLs by mouth 2 (two) times daily between meals.   finasteride 5 MG tablet Commonly known as: PROSCAR TAKE 1 TABLET(5 MG) BY MOUTH DAILY What changed: See the new  instructions.   magnesium gluconate 500 MG tablet Commonly known as: MAGONATE Take 1 tablet (500 mg total) by mouth 2 (two) times daily.   metoprolol tartrate 50 MG tablet Commonly known as: LOPRESSOR Take 50 mg by mouth 2 (two) times daily.   multivitamin with minerals Tabs tablet Take 1 tablet by mouth daily. Start taking on: Jun 18, 2021   polyethylene glycol 17 g packet Commonly known as: MIRALAX / GLYCOLAX Take 17 g by mouth daily as needed. What changed:  when to take this reasons to take this   potassium chloride SA 20 MEQ tablet Commonly known as: KLOR-CON M Take 1 tablet (20 mEq total) by mouth every other day.   rivaroxaban 20 MG Tabs tablet Commonly known as: Xarelto Take 1 tablet (20 mg total) by mouth daily with supper. Please keep upcoming telemedicine appointment in June. Thank you.   senna-docusate 8.6-50 MG tablet Commonly known as: Senokot-S Take 1 tablet by mouth 2 (two) times daily.   tadalafil 5 MG tablet Commonly known as: CIALIS Take 5 mg by mouth every evening.   traMADol 50 MG tablet Commonly known as: ULTRAM Take 1 tablet (50 mg total) by mouth 3 (three) times daily as needed (pain).        Discharge Exam: BP (!) 131/93   Pulse 71   Temp 98.8 F (37.1 C) (Axillary)   Resp 16   Ht '5\' 9"'$  (1.753 m)   Wt 117.9 kg   SpO2 98%   BMI 38.40 kg/m   General exam: Appears calm and comfortable Respiratory system: Clear to auscultation. Respiratory effort normal. Cardiovascular system: S1 &  S2 heard, RRR. No murmurs, rubs, gallops or clicks. Gastrointestinal system: Abdomen is moderately distended, soft and nontender. Normal bowel sounds heard. Central nervous system: Alert and oriented. Musculoskeletal: No LE edema. No calf tenderness  Condition at discharge: stable  The results of significant diagnostics from this hospitalization (including imaging, microbiology, ancillary and laboratory) are listed below for reference.   Imaging  Studies: CT ABDOMEN PELVIS WO CONTRAST  Result Date: 05/21/2021 CLINICAL DATA:  Abdominal pain, acute, nonlocalized with new acute kidney injury and urinary retention. Dizziness with weakness for 3-4 days. EXAM: CT ABDOMEN AND PELVIS WITHOUT CONTRAST TECHNIQUE: Multidetector CT imaging of the abdomen and pelvis was performed following the standard protocol without IV contrast. RADIATION DOSE REDUCTION: This exam was performed according to the departmental dose-optimization program which includes automated exposure control, adjustment of the mA and/or kV according to patient size and/or use of iterative reconstruction technique. COMPARISON:  Abdominal radiographs 05/21/2021. Report only from remote abdominal CT 07/07/2000-unavailable for direct comparison. FINDINGS: Lower chest: Mild linear atelectasis or scarring at both lung bases. No significant pleural or pericardial effusion. Atherosclerosis of the aorta and coronary arteries. Hepatobiliary: No focal hepatic abnormalities are identified on noncontrast imaging. There are small dependent calcified gallstones. No significant gallbladder distension, wall thickening or surrounding inflammation. No evidence of biliary dilatation. Pancreas: Unremarkable. No pancreatic ductal dilatation or surrounding inflammatory changes. Spleen: Normal in size without focal abnormality. Adrenals/Urinary Tract: Both adrenal glands appear normal. Renal assessment limited by the lack of intravenous contrast. Both kidneys demonstrate mild ureteropelvocaliectasis and perinephric soft tissue stranding. No obstructing ureteral calculi are demonstrated. However, there is a peripherally calcified mass medially within the lower pole of the right kidney, measuring 4.0 x 1.8 cm on image 49/3. Inferior to this mass, there is a noncalcified low-density lesion which is mildly heterogeneous, measuring 4.7 x 3.9 cm on image 52/3. This could reflect a complex cyst or necrotic mass. Probable small  cyst in the interpolar region of the right kidney, measuring 3.4 cm on image 42/3. No focal left renal abnormalities are identified. The bladder is moderately distended. Punctate dependent calcification in the bladder (image 86/3) could reflect a small calculus. Stomach/Bowel: No enteric contrast administered. The stomach appears unremarkable for its degree of distension. No evidence of bowel wall thickening, distention or surrounding inflammatory change. The appendix appears normal. Moderate stool throughout the colon. Vascular/Lymphatic: There are no enlarged abdominal or pelvic lymph nodes. Aortic and branch vessel atherosclerosis without acute vascular findings on noncontrast imaging. Reproductive: The prostate gland is moderately enlarged. Previous Urolift procedure. Other: As above, nonspecific perinephric soft tissue stranding bilaterally, extending into the pelvis with an asymmetric perivesical component on the left. No focal fluid collection. Small left inguinal hernia containing only fat. No ascites or free air. Musculoskeletal: No acute or significant osseous findings. Mild spondylosis associated with a mild convex right thoracolumbar scoliosis. Mild degenerative changes of both hips. IMPRESSION: 1. Nonspecific bladder distension and perivesical soft tissue stranding without apparent bladder wall thickening or focal bladder lesion. Findings may be secondary to bladder outlet obstruction from prostate gland enlargement. 2. Mild bilateral hydronephrosis without evidence of obstructing ureteral calculus. This may relate to the bladder distension. Possible small bladder calculus without other evidence of urinary tract calculus. 3. Complex, peripherally calcified mass in the lower pole of the right kidney, incompletely characterized without contrast. Remote prior studies (unavailable for comparison) have described right renal cysts. Based on the calcifications, urothelial malignancy cannot be excluded by this  examination. Assuming the patient's  renal insufficiency precludes iodinated contrast administration, consider further evaluation with renal ultrasound or abdominal MRI (preferred). MRI is likely to be best tolerated as an outpatient after the patient's acute illness has resolved. 4. Cholelithiasis without evidence of cholecystitis. 5. Coronary and Aortic Atherosclerosis (ICD10-I70.0). Electronically Signed   By: Richardean Sale M.D.   On: 05/21/2021 15:06   DG Chest 2 View  Result Date: 05/21/2021 CLINICAL DATA:  Dizziness EXAM: CHEST - 2 VIEW COMPARISON:  03/18/2011 FINDINGS: Transverse diameter of heart is increased. There are no signs of pulmonary edema. Small left pleural effusion is seen. Patient's chin is partly obscuring the apices. As far as seen, there is no demonstrable pneumothorax. IMPRESSION: Cardiomegaly.  Small left pleural effusion. Electronically Signed   By: Elmer Picker M.D.   On: 05/21/2021 09:10   DG Abd 1 View  Result Date: 05/21/2021 CLINICAL DATA:  Concern for obstruction EXAM: ABDOMEN - 1 VIEW COMPARISON:  None Available. FINDINGS: Portions of the lateral abdomen are excluded from the field of view. Nonobstructive bowel gas pattern. Large stool burden. Moderate to severe degenerative changes of the lumbar spine with dextrocurvature. IMPRESSION: Nonobstructive bowel gas pattern. Large stool burden. Electronically Signed   By: Yetta Glassman M.D.   On: 05/21/2021 12:24   CT Head Wo Contrast  Result Date: 06/12/2021 CLINICAL DATA:  Delirium EXAM: CT HEAD WITHOUT CONTRAST TECHNIQUE: Contiguous axial images were obtained from the base of the skull through the vertex without intravenous contrast. RADIATION DOSE REDUCTION: This exam was performed according to the departmental dose-optimization program which includes automated exposure control, adjustment of the mA and/or kV according to patient size and/or use of iterative reconstruction technique. COMPARISON:  None Available.  FINDINGS: Brain: No evidence of acute infarction, hemorrhage, hydrocephalus, extra-axial collection or mass lesion/mass effect. Mild for age patchy white matter hypodensities, nonspecific but compatible with chronic microvascular ischemic disease. Vascular: No hyperdense vessel identified. Skull: No acute fracture. Sinuses/Orbits: Mild paranasal sinus mucosal thickening. No acute orbital findings. Other: No mastoid effusions. IMPRESSION: No evidence of acute intracranial abnormality. Electronically Signed   By: Margaretha Sheffield M.D.   On: 06/12/2021 15:56   MR ABDOMEN W WO CONTRAST  Result Date: 05/23/2021 CLINICAL DATA:  Complex right renal lesion for further characterization. EXAM: MRI ABDOMEN WITHOUT AND WITH CONTRAST TECHNIQUE: Multiplanar multisequence MR imaging of the abdomen was performed both before and after the administration of intravenous contrast. CONTRAST:  35m GADAVIST GADOBUTROL 1 MMOL/ML IV SOLN COMPARISON:  Multiple exams, including 05/21/2021 CT scan FINDINGS: Body habitus reduces diagnostic sensitivity and specificity. Despite efforts by the technologist and patient, motion artifact is present on today's exam and could not be eliminated. This reduces exam sensitivity and specificity. Lower chest: Mild atelectasis along both hemidiaphragms. Mild cardiomegaly. Hepatobiliary: Several layering gallstones are present in the gallbladder. No substantial gallbladder wall thickening. No biliary dilatation. Pancreas:  Unremarkable Spleen:  . unremarkable Adrenals/Urinary Tract:  Both adrenal glands appear normal. There is abnormal diffuse wall thickening in both renal collecting systems as shown on image 104 of series 11003, most likely inflammatory given this appearance. There is a substantially complex appearance of the right kidney lower pole lesion which has known calcification in higher density elements on the CT scan. The top of this process, there is a nonenhancing precontrast T1 hyperintense  2.0 by 1.4 cm component on image 97 of series 1000, likely a Bosniak category 2 cyst. Below this there is loss of renal parenchyma with localized lobulated cystic lesion with some precontrast  T1 hyperintense elements. This has a an anterior-medial rim demonstrating progressive enhancement up to 1.6 cm in thickness on image 117 of series 11004. On arterial phase images such as image 116 of series 11001 this has more of a corticomedullary appearance with lack of enhancement of the medullary component, but on later phase images there is diffuse enhancement of this soft tissue component. This may well represent a nearly disconnected component of renal parenchymal tissue due to scarring and cyst formation given that the enhancement is similar to the other renal parenchyma, but given this complex appearance and soft tissue enhancement, a partially cystic malignancy is difficult to exclude with confidence. Even on the delayed phase images there was not enough excretion contrast medium into the collecting system to tell whether the cystic elements along the right kidney lower pole anteriorly represent cysts or localized caliectasis/calyceal diverticulum, although I favor cysts. Stomach/Bowel: Prominent stool throughout the colon favors constipation. Vascular/Lymphatic: Atherosclerosis is present, including aortoiliac atherosclerotic disease. Other:  No supplemental non-categorized findings. Musculoskeletal: Abnormal edema tracking superficial and deep to the right external oblique muscle on image 28 series 5, no abnormal enhancement in this vicinity. Lumbar spondylosis and degenerative disc disease. IMPRESSION: 1. Complex cystic elements in the right kidney lower pole including a nonenhancing T1 hyperintense portion superiorly, adjacent complex cystic lesions inferiorly with known calcification along the margins, and also a rim of enhancement anteromedially along the cystic elements which most likely represents residual  renal parenchyma adjacent to a region of renal scarring rather than a soft tissue mass given the overall morphology in the pattern of enhancement which resembles the corticomedullary enhancement in the renal parenchyma on all phases. Given the overall complexity in the region and the suggestive but not definitive characterization of the enhancing tissue as benign renal tissue, this should be treated as a Bosniak category IIF lesion. Renal protocol MRI or CT with and without contrast is recommended in 6 months time for further characterization. 2. Abnormal diffuse wall thickening in both renal collecting systems, likely inflammatory. Correlate with urine analysis in assessing for urinary infection. 3. Other imaging findings of potential clinical significance: Mild cardiomegaly. Cholelithiasis. Mild atelectasis along both hemidiaphragms. Prominent stool throughout the colon favors constipation. Edema along the right external oblique muscle, cannot exclude muscle strain. Lumbar spondylosis and degenerative disc disease. Aortic Atherosclerosis (ICD10-I70.0). 4. Reduced sensitivity due to motion artifact and body habitus. Electronically Signed   By: Van Clines M.D.   On: 05/23/2021 13:05   US RENAL  Result Date: 05/21/2021 CLINICAL DATA:  Right renal mass seen in the previous CT EXAM: RENAL / URINARY TRACT ULTRASOUND COMPLETE COMPARISON:  CT done earlier today FINDINGS: Right Kidney: Renal measurements: 13.3 x 8.4 x 7.6 cm = volume: 443.7 mL. There is no hydronephrosis. There is 3.1 by 2.6 x 3.2 cm cyst in the lateral aspect of midportion of right kidney. There is 4 x 2.4 x 2.7 cm cystic structure in the lower pole with possible hyperechoic foci in the margins. There is another 4.7 x 3.2 x 4.3 cm cyst in the lower pole of right kidney. Left Kidney: Renal measurements: 14.5 x 7.5 x 8.4 cm = volume: 478.3 mL. Echogenicity within normal limits. No mass or hydronephrosis visualized. Bladder: Urinary bladder is  empty and not evaluated. Other: Splenule is noted adjacent to the medial margin of spleen. IMPRESSION: There are multiple cysts in the mid and lower portions of right kidney. Possible calcification is noted in the margin of 1 of the cysts  in the lower pole of right kidney. Further evaluation with MRI should be considered to rule out any neoplastic process. There is no hydronephrosis. Electronically Signed   By: Elmer Picker M.D.   On: 05/21/2021 17:08   DG Chest Portable 1 View  Result Date: 06/12/2021 CLINICAL DATA:  Weakness, recent hospitalization for urine retention. EXAM: PORTABLE CHEST 1 VIEW COMPARISON:  Radiograph May 21, 2021 FINDINGS: Similar cardiomegaly. No overt pulmonary edema or focal airspace consolidation. No visible pleural effusion or pneumothorax. No acute osseous abnormality. IMPRESSION: Cardiomegaly without overt pulmonary edema or focal airspace consolidation. Electronically Signed   By: Dahlia Bailiff M.D.   On: 06/12/2021 15:01    Microbiology: Results for orders placed or performed during the hospital encounter of 06/12/21  Urine Culture     Status: Abnormal   Collection Time: 06/12/21  5:52 PM   Specimen: Urine, Catheterized  Result Value Ref Range Status   Specimen Description URINE, CATHETERIZED  Final   Special Requests   Final    NONE Performed at [facility], [address] [place], [place] [address]    Culture >=100,000 COLONIES/mL PROTEUS MIRABILIS (A)  Final   Report Status 06/14/2021 FINAL  Final   Organism ID, Bacteria PROTEUS MIRABILIS (A)  Final      Susceptibility   Proteus mirabilis - MIC*    AMPICILLIN <=2 SENSITIVE Sensitive     CEFAZOLIN <=4 SENSITIVE Sensitive     CEFEPIME <=0.12 SENSITIVE Sensitive     CEFTRIAXONE <=0.25 SENSITIVE Sensitive     CIPROFLOXACIN <=0.25 SENSITIVE Sensitive     GENTAMICIN <=1 SENSITIVE Sensitive     IMIPENEM 1 SENSITIVE Sensitive     NITROFURANTOIN 128 RESISTANT Resistant     TRIMETH/SULFA <=20  SENSITIVE Sensitive     AMPICILLIN/SULBACTAM <=2 SENSITIVE Sensitive     PIP/TAZO <=4 SENSITIVE Sensitive     * >=100,000 COLONIES/mL PROTEUS MIRABILIS  Culture, blood (single) w Reflex to ID Panel     Status: None (Preliminary result)   Collection Time: 06/13/21  3:05 AM   Specimen: BLOOD LEFT HAND  Result Value Ref Range Status   Specimen Description BLOOD LEFT HAND  Final   Special Requests   Final    BOTTLES DRAWN AEROBIC ONLY Blood Culture adequate volume   Culture   Final    NO GROWTH 4 DAYS Performed at [facility], [address], [place], [place] [address]    Report Status PENDING  Incomplete    Labs: CBC: Recent Labs  Lab 06/12/21 1405 06/13/21 0305  WBC 8.6 8.1  NEUTROABS 7.0  --   HGB 15.5 14.0  HCT 46.2 40.0  MCV 98.3 96.4  PLT 155 527*   Basic Metabolic Panel: Recent Labs  Lab 06/12/21 1405 06/13/21 0305 06/14/21 0427 06/16/21 0849  NA 139 136  --  142  K 4.3 3.2* 3.4* 3.1*  CL 106 104  --  104  CO2 25 27  --  31  GLUCOSE 136* 119*  --  156*  BUN 10 7*  --  17  CREATININE 0.73 0.65  --  0.93  CALCIUM 8.7* 8.1*  --  8.6*  MG  --   --   --  1.9   Liver Function Tests: Recent Labs  Lab 06/12/21 1405  AST 18  ALT 7  ALKPHOS 88  BILITOT 1.3*  PROT 6.4*  ALBUMIN 3.2*   CBG: Recent Labs  Lab 06/13/21 2026  GLUCAP 142*  Discharge time spent: 35 minutes.  Signed: Cordelia Poche, MD [facility] 06/17/2021

## 2021-06-17 NOTE — Plan of Care (Signed)
  Problem: Health Behavior/Discharge Planning: Goal: Ability to manage health-related needs will improve Outcome: Adequate for Discharge   Problem: Education: Goal: Knowledge of General Education information will improve Description: Including pain rating scale, medication(s)/side effects and non-pharmacologic comfort measures Outcome: Adequate for Discharge   Problem: Clinical Measurements: Goal: Ability to maintain clinical measurements within normal limits will improve Outcome: Adequate for Discharge Goal: Will remain free from infection Outcome: Adequate for Discharge Goal: Diagnostic test results will improve Outcome: Adequate for Discharge Goal: Respiratory complications will improve Outcome: Adequate for Discharge Goal: Cardiovascular complication will be avoided Outcome: Adequate for Discharge   Problem: Activity: Goal: Risk for activity intolerance will decrease Outcome: Adequate for Discharge   Problem: Nutrition: Goal: Adequate nutrition will be maintained Outcome: Adequate for Discharge   Problem: Coping: Goal: Level of anxiety will decrease Outcome: Adequate for Discharge   Problem: Elimination: Goal: Will not experience complications related to bowel motility Outcome: Adequate for Discharge Goal: Will not experience complications related to urinary retention Outcome: Adequate for Discharge   Problem: Pain Managment: Goal: General experience of comfort will improve Outcome: Adequate for Discharge   Problem: Safety: Goal: Ability to remain free from injury will improve Outcome: Adequate for Discharge   Problem: Skin Integrity: Goal: Risk for impaired skin integrity will decrease Outcome: Adequate for Discharge   Problem: Inadequate Intake (NI-2.1) Goal: Food and/or nutrient delivery Description: Individualized approach for food/nutrient provision. Outcome: Adequate for Discharge

## 2021-06-17 NOTE — TOC Transition Note (Signed)
Transition of Care [facility]) - CM/SW Discharge Note   Patient Details  Name: Isaiah Pena MRN: [id number] Date of Birth: [birth year]  Transition of Care [facility]) CM/SW Contact:  Milinda Antis, [place] Phone Number: 06/17/2021, 11:25 AM   Clinical Narrative:    Patient will DC to: [place] Anticipated DC date: 06/17/2021 Family notified: Yes Transport by: Corey Harold   Per MD patient ready for DC to SNF. RN to call report prior to discharge [phone] room 902P. RN, patient, patient's family, and facility notified of DC. Discharge Summary and FL2 sent to facility. DC packet on chart. Ambulance transport requested for patient.   CSW will sign off for now as social work intervention is no longer needed. Please consult [place] again if new needs arise.     Final next level of care: Skilled Nursing Facility Barriers to Discharge: Barriers Resolved   Patient Goals and CMS Choice Patient states their goals for this hospitalization and ongoing recovery are:: To go to SNF CMS Medicare.gov Compare Post Acute Care list provided to:: Patient Represenative (must comment) Choice offered to / list presented to : Patient, Adult Children, Spouse  Discharge Placement              Patient chooses bed at:  [facility]) Patient to be transferred to facility by: [place] Name of family member notified: [name] Patient and family notified of of transfer: 06/17/21  Discharge Plan and Services                                     Social Determinants of Health (SDOH) Interventions     Readmission Risk Interventions     View : No data to display.

## 2021-06-18 DIAGNOSIS — T839XXA Unspecified complication of genitourinary prosthetic device, implant and graft, initial encounter: Secondary | ICD-10-CM | POA: Diagnosis not present

## 2021-06-18 DIAGNOSIS — N390 Urinary tract infection, site not specified: Secondary | ICD-10-CM | POA: Diagnosis not present

## 2021-06-18 DIAGNOSIS — G4733 Obstructive sleep apnea (adult) (pediatric): Secondary | ICD-10-CM | POA: Diagnosis not present

## 2021-06-18 DIAGNOSIS — R1310 Dysphagia, unspecified: Secondary | ICD-10-CM | POA: Diagnosis not present

## 2021-06-18 DIAGNOSIS — E876 Hypokalemia: Secondary | ICD-10-CM | POA: Diagnosis not present

## 2021-06-18 DIAGNOSIS — R339 Retention of urine, unspecified: Secondary | ICD-10-CM | POA: Diagnosis not present

## 2021-06-18 DIAGNOSIS — M1990 Unspecified osteoarthritis, unspecified site: Secondary | ICD-10-CM | POA: Diagnosis not present

## 2021-06-18 DIAGNOSIS — I4891 Unspecified atrial fibrillation: Secondary | ICD-10-CM | POA: Diagnosis not present

## 2021-06-18 DIAGNOSIS — R197 Diarrhea, unspecified: Secondary | ICD-10-CM | POA: Diagnosis not present

## 2021-06-18 DIAGNOSIS — E785 Hyperlipidemia, unspecified: Secondary | ICD-10-CM | POA: Diagnosis not present

## 2021-06-18 DIAGNOSIS — I150 Renovascular hypertension: Secondary | ICD-10-CM | POA: Diagnosis not present

## 2021-06-18 DIAGNOSIS — G20 Parkinson's disease: Secondary | ICD-10-CM | POA: Diagnosis not present

## 2021-06-18 LAB — CULTURE, BLOOD (SINGLE)
Culture: NO GROWTH
Special Requests: ADEQUATE

## 2021-06-20 DIAGNOSIS — N390 Urinary tract infection, site not specified: Secondary | ICD-10-CM | POA: Diagnosis not present

## 2021-06-20 DIAGNOSIS — G20 Parkinson's disease: Secondary | ICD-10-CM | POA: Diagnosis not present

## 2021-06-20 DIAGNOSIS — N401 Enlarged prostate with lower urinary tract symptoms: Secondary | ICD-10-CM | POA: Diagnosis not present

## 2021-06-20 DIAGNOSIS — R2681 Unsteadiness on feet: Secondary | ICD-10-CM | POA: Diagnosis not present

## 2021-06-20 DIAGNOSIS — I119 Hypertensive heart disease without heart failure: Secondary | ICD-10-CM | POA: Diagnosis not present

## 2021-06-20 DIAGNOSIS — M6259 Muscle wasting and atrophy, not elsewhere classified, multiple sites: Secondary | ICD-10-CM | POA: Diagnosis not present

## 2021-06-20 DIAGNOSIS — G4733 Obstructive sleep apnea (adult) (pediatric): Secondary | ICD-10-CM | POA: Diagnosis not present

## 2021-06-21 DIAGNOSIS — G20 Parkinson's disease: Secondary | ICD-10-CM | POA: Diagnosis not present

## 2021-06-21 DIAGNOSIS — F32A Depression, unspecified: Secondary | ICD-10-CM | POA: Diagnosis not present

## 2021-06-21 DIAGNOSIS — N401 Enlarged prostate with lower urinary tract symptoms: Secondary | ICD-10-CM | POA: Diagnosis not present

## 2021-06-21 DIAGNOSIS — G4733 Obstructive sleep apnea (adult) (pediatric): Secondary | ICD-10-CM | POA: Diagnosis not present

## 2021-06-21 DIAGNOSIS — I119 Hypertensive heart disease without heart failure: Secondary | ICD-10-CM | POA: Diagnosis not present

## 2021-06-21 DIAGNOSIS — R2681 Unsteadiness on feet: Secondary | ICD-10-CM | POA: Diagnosis not present

## 2021-06-21 DIAGNOSIS — N390 Urinary tract infection, site not specified: Secondary | ICD-10-CM | POA: Diagnosis not present

## 2021-06-21 DIAGNOSIS — M6259 Muscle wasting and atrophy, not elsewhere classified, multiple sites: Secondary | ICD-10-CM | POA: Diagnosis not present

## 2021-06-21 DIAGNOSIS — G4700 Insomnia, unspecified: Secondary | ICD-10-CM | POA: Diagnosis not present

## 2021-06-21 DIAGNOSIS — R339 Retention of urine, unspecified: Secondary | ICD-10-CM | POA: Diagnosis not present

## 2021-06-24 DIAGNOSIS — N390 Urinary tract infection, site not specified: Secondary | ICD-10-CM | POA: Diagnosis not present

## 2021-06-24 DIAGNOSIS — G20 Parkinson's disease: Secondary | ICD-10-CM | POA: Diagnosis not present

## 2021-06-24 DIAGNOSIS — G4700 Insomnia, unspecified: Secondary | ICD-10-CM | POA: Diagnosis not present

## 2021-06-24 DIAGNOSIS — N401 Enlarged prostate with lower urinary tract symptoms: Secondary | ICD-10-CM | POA: Diagnosis not present

## 2021-06-24 DIAGNOSIS — R2681 Unsteadiness on feet: Secondary | ICD-10-CM | POA: Diagnosis not present

## 2021-06-24 DIAGNOSIS — M6259 Muscle wasting and atrophy, not elsewhere classified, multiple sites: Secondary | ICD-10-CM | POA: Diagnosis not present

## 2021-06-24 DIAGNOSIS — I119 Hypertensive heart disease without heart failure: Secondary | ICD-10-CM | POA: Diagnosis not present

## 2021-06-24 DIAGNOSIS — G4733 Obstructive sleep apnea (adult) (pediatric): Secondary | ICD-10-CM | POA: Diagnosis not present

## 2021-06-27 DIAGNOSIS — F4321 Adjustment disorder with depressed mood: Secondary | ICD-10-CM | POA: Diagnosis not present

## 2021-07-01 DIAGNOSIS — G4733 Obstructive sleep apnea (adult) (pediatric): Secondary | ICD-10-CM | POA: Diagnosis not present

## 2021-07-01 DIAGNOSIS — M6259 Muscle wasting and atrophy, not elsewhere classified, multiple sites: Secondary | ICD-10-CM | POA: Diagnosis not present

## 2021-07-01 DIAGNOSIS — G20 Parkinson's disease: Secondary | ICD-10-CM | POA: Diagnosis not present

## 2021-07-01 DIAGNOSIS — R2681 Unsteadiness on feet: Secondary | ICD-10-CM | POA: Diagnosis not present

## 2021-07-01 DIAGNOSIS — I119 Hypertensive heart disease without heart failure: Secondary | ICD-10-CM | POA: Diagnosis not present

## 2021-07-01 DIAGNOSIS — N401 Enlarged prostate with lower urinary tract symptoms: Secondary | ICD-10-CM | POA: Diagnosis not present

## 2021-07-01 DIAGNOSIS — N390 Urinary tract infection, site not specified: Secondary | ICD-10-CM | POA: Diagnosis not present

## 2021-07-01 DIAGNOSIS — G4700 Insomnia, unspecified: Secondary | ICD-10-CM | POA: Diagnosis not present

## 2021-07-02 DIAGNOSIS — L988 Other specified disorders of the skin and subcutaneous tissue: Secondary | ICD-10-CM | POA: Diagnosis not present

## 2021-07-05 ENCOUNTER — Telehealth (INDEPENDENT_AMBULATORY_CARE_PROVIDER_SITE_OTHER): Payer: Medicare Other | Admitting: Cardiology

## 2021-07-05 ENCOUNTER — Telehealth: Payer: Self-pay | Admitting: *Deleted

## 2021-07-05 VITALS — Ht 68.0 in | Wt 241.0 lb

## 2021-07-05 DIAGNOSIS — I10 Essential (primary) hypertension: Secondary | ICD-10-CM

## 2021-07-05 DIAGNOSIS — G20 Parkinson's disease: Secondary | ICD-10-CM | POA: Diagnosis not present

## 2021-07-05 DIAGNOSIS — E7800 Pure hypercholesterolemia, unspecified: Secondary | ICD-10-CM

## 2021-07-05 DIAGNOSIS — I4821 Permanent atrial fibrillation: Secondary | ICD-10-CM | POA: Diagnosis not present

## 2021-07-05 DIAGNOSIS — I119 Hypertensive heart disease without heart failure: Secondary | ICD-10-CM | POA: Diagnosis not present

## 2021-07-05 DIAGNOSIS — N390 Urinary tract infection, site not specified: Secondary | ICD-10-CM | POA: Diagnosis not present

## 2021-07-05 DIAGNOSIS — N401 Enlarged prostate with lower urinary tract symptoms: Secondary | ICD-10-CM | POA: Diagnosis not present

## 2021-07-05 DIAGNOSIS — R2681 Unsteadiness on feet: Secondary | ICD-10-CM | POA: Diagnosis not present

## 2021-07-05 DIAGNOSIS — G4700 Insomnia, unspecified: Secondary | ICD-10-CM | POA: Diagnosis not present

## 2021-07-05 DIAGNOSIS — I2510 Atherosclerotic heart disease of native coronary artery without angina pectoris: Secondary | ICD-10-CM

## 2021-07-05 DIAGNOSIS — G4733 Obstructive sleep apnea (adult) (pediatric): Secondary | ICD-10-CM | POA: Diagnosis not present

## 2021-07-05 DIAGNOSIS — M6259 Muscle wasting and atrophy, not elsewhere classified, multiple sites: Secondary | ICD-10-CM | POA: Diagnosis not present

## 2021-07-05 NOTE — Telephone Encounter (Signed)
I spoke with [facility].  They will draw fasting lipid and ALT.  Results to be faxed to our office

## 2021-07-05 NOTE — Progress Notes (Signed)
Virtual Visit via Telephone Note   This visit type was conducted due to national recommendations for restrictions regarding the COVID-19 Pandemic (e.g. social distancing) in an effort to limit this patient's exposure and mitigate transmission in our community.  Due to his co-morbid illnesses, this patient is at least at moderate risk for complications without adequate follow up.  This format is felt to be most appropriate for this patient at this time.  All issues noted in this document were discussed and addressed.  A limited physical exam was performed with this format.  Please refer to the patient's chart for his consent to telehealth for [facility]   Evaluation Performed:  Follow-up visit  Date:  07/05/2021   ID:  Isaiah Pena, DOB 01-Mar-1950, MRN [id number]  Patient Location:  Home  Provider location:   [place]  PCP:  Mayra Neer, MD  Cardiologist:  Fransico Him, MD Electrophysiologist:  None   Chief Complaint:  CAD, HTN, OSA, PAF, HLD  History of Present Illness:    Isaiah Pena is a 71 y.o. male who presents via audio/video conferencing for a telehealth visit today.    Isaiah Pena is a 71y.o. male with a hx of CAD s/p PCI of the RCA, HTN, hyperlipidemia, permanent atrial fibrillation and OSA on BiPAP. Unfortunately he was not compliant with his PAP device and it was taken away.  In order to get a new device he had to repeat a sleep study.  Repeat sleep study showed mild OSA with an AHI of 9.7/hr and O2 sats as low as 86% with nocturnal hypoxemia.  He was started on auto CPAP from 4 to 20cm H2O but stopped using it about a year ago due to significant Parkinsons's symptoms and could not keep the mask on.   He is here today for followup and is doing well.  He denies any chest pain or pressure,  PND, orthopnea, LE edema, dizziness, palpitations or syncope. He has chronic DOE that is very stable and related to his Parkinsons.  He is compliant with his meds and is  tolerating meds with no SE.     The patient does not have symptoms concerning for COVID-19 infection (fever, chills, cough, or new shortness of breath).   Prior CV studies:   The following studies were reviewed today:  Sleep study and PAP compliance download  Past Medical History:  Diagnosis Date   Anticoagulant long-term use    xarelto   Bilateral lower extremity edema    BPH (benign prostatic hyperplasia)    BPH (benign prostatic hyperplasia)    Central serous retinopathy    LOSS OF CENTER VISION LEFT EYE W/ BLURRED VISION-  was treated  methotrexate by specialist at [facility] and released   Coronary artery disease cardiologist-  dr Tressia Miners Anani Gu   01/ 2004  abnormal cardiolite  s/p  cardiac cath w/ PCI and DES to dRCA   Edema of extremities    CHRONIC    Erectile dysfunction    GERD (gastroesophageal reflux disease)    History of colon polyps    2011 per pt benign   History of kidney stones    History of simple renal cyst    drained via radiology   History of torn meniscus of right knee    Hyperlipidemia    Hypertension    Kidney stone    HX OF STONE-PASSED    Lower urinary tract symptoms (LUTS)    OSA (obstructive sleep apnea) 09/29/2011  NPSG 2004:  AHI 25/hr  Rx with bilevel.      OSA treated with BiPAP    moderate per study 01-23-2002   Parkinson's disease [facility])    neurologist-  dr ed hill ([facility])   Permanent atrial fibrillation ([place])    Persistent atrial fibrillation ([place]) 11/08/2012   S/P drug eluting coronary stent placement    01/ 2004  x1 to dRCA   Past Surgical History:  Procedure Laterality Date   CARDIOVASCULAR STRESS TEST  05-07-2015   dr Tressia Miners Zackry Deines   normal nuclear study w/ no ischemia/  normal LV function and wall motion , stress ef 55%   COLONOSCOPY WITH PROPOFOL  2011   CORONARY ANGIOPLASTY WITH STENT PLACEMENT  02-11-2002  dr Daneen Schick   abnormal cardiolite--  PCI and DES to dRCA (99%),  pRCA luminal irregularities, mild to moderate  LAD and CFX disease,  normal LVF   CYSTOSCOPY WITH INSERTION OF UROLIFT N/A 01/28/2016   Procedure: CYSTOSCOPY WITH INSERTION OF UROLIFT;  Surgeon: Franchot Gallo, MD;  Location: [facility];  Service: Urology;  Laterality: N/A;   HERNIA REPAIR     AGE 32   KNEE ARTHROSCOPY  03/21/2011   Procedure: ARTHROSCOPY KNEE;  Surgeon: Tobi Bastos, MD;  Location: [facility];  Service: Orthopedics;  Laterality: Right;     Current Meds  Medication Sig   acetaminophen (TYLENOL) 650 MG CR tablet Take 2 tablets (1,300 mg total) by mouth every 8 (eight) hours as needed for pain.   amantadine (SYMMETREL) 100 MG capsule Take 100 mg by mouth See admin instructions. Take one capsule (100 mg) by mouth daily at bedtime - 1am   amLODipine (NORVASC) 10 MG tablet Take 1 tablet (10 mg total) by mouth daily.   atorvastatin (LIPITOR) 10 MG tablet TAKE 1/2 TABLET BY MOUTH EVERY OTHER DAY AND ALTERNATING 1 TABLET BY MOUTH EVERY OTHER DAY. Please keep upcoming appt in April 2023 before anymore refills. Thank you (Patient taking differently: Take 5-10 mg by mouth See admin instructions. Take 1/2 tablet (5 mg) by mouth every other evening, alternate with 1 tablet (10 mg) every other evening. Please keep upcoming appt in April 2023 before anymore refills. Thank you)   carbidopa-levodopa (SINEMET IR) 25-100 MG tablet Take 3 tablets by mouth every 8 (eight) hours. 8am, 5pm and 1am   cephALEXin (KEFLEX) 500 MG capsule Take 1 capsule (500 mg total) by mouth every 12 (twelve) hours for 2 days, THEN 1 capsule (500 mg total) at bedtime.   clonazePAM (KLONOPIN) 0.5 MG tablet Take 0.5 tablets (0.25 mg total) by mouth 2 (two) times daily as needed for anxiety.   entacapone (COMTAN) 200 MG tablet Take 200 mg by mouth every 8 (eight) hours. 9am, 5pm, 1am   esomeprazole (NEXIUM) 20 MG capsule Take 20 mg by mouth every other day. OTC   ezetimibe (ZETIA) 10 MG tablet Take 10 mg by mouth daily.   feeding supplement (ENSURE ENLIVE  / ENSURE PLUS) LIQD Take 237 mLs by mouth 2 (two) times daily between meals.   finasteride (PROSCAR) 5 MG tablet TAKE 1 TABLET(5 MG) BY MOUTH DAILY (Patient taking differently: Take 5 mg by mouth daily.)   magnesium gluconate (MAGONATE) 500 MG tablet Take 1 tablet (500 mg total) by mouth 2 (two) times daily.   metoprolol (LOPRESSOR) 50 MG tablet Take 50 mg by mouth 2 (two) times daily.   polyethylene glycol (MIRALAX / GLYCOLAX) 17 g packet Take 17 g by mouth  daily as needed.   potassium chloride SA (KLOR-CON M) 20 MEQ tablet Take 1 tablet (20 mEq total) by mouth every other day.   rivaroxaban (XARELTO) 20 MG TABS tablet Take 1 tablet (20 mg total) by mouth daily with supper. Please keep upcoming telemedicine appointment in June. Thank you.   tadalafil (CIALIS) 5 MG tablet Take 5 mg by mouth every evening.   traMADol (ULTRAM) 50 MG tablet Take 1 tablet (50 mg total) by mouth 3 (three) times daily as needed (pain).     Allergies:   Sulfa antibiotics   Social History   Tobacco Use   Smoking status: Never   Smokeless tobacco: Never  Substance Use Topics   Alcohol use: No   Drug use: No     Family Hx: The patient's family history is not on file.  ROS:   Please see the history of present illness.     All other systems reviewed and are negative.   Labs/Other Tests and Data Reviewed:    Recent Labs: 05/21/2021: B Natriuretic Peptide 98.0 05/22/2021: TSH 0.640 06/12/2021: ALT 7 06/13/2021: Hemoglobin 14.0; Platelets 143 06/16/2021: BUN 17; Creatinine, Ser 0.93; Magnesium 1.9; Potassium 3.1; Sodium 142   Recent Lipid Panel Lab Results  Component Value Date/Time   CHOL 121 11/17/2019 02:42 PM   TRIG 64 11/17/2019 02:42 PM   HDL 46 11/17/2019 02:42 PM   CHOLHDL 2.6 11/17/2019 02:42 PM   CHOLHDL 2.4 11/09/2015 08:58 AM   LDLCALC 61 11/17/2019 02:42 PM    Wt Readings from Last 3 Encounters:  07/05/21 241 lb (109.3 kg)  06/12/21 260 lb (117.9 kg)  06/02/21 254 lb 3.1 oz (115.3 kg)      Objective:    Vital Signs:  Ht '5\' 8"'$  (1.727 m)   Wt 241 lb (109.3 kg)   BMI 36.64 kg/m   Well nourished, well developed male in no acute distress. Well appearing, alert and conversant, regular work of breathing,  good skin color  Eyes- anicteric mouth- oral mucosa is pink  neuro- grossly intact skin- no apparent rash or lesions or cyanosis   ASSESSMENT & PLAN:    1.  ASCAD  -s/p PCI of the RCA.   -he has not had any anginal symptoms -Continue prescription drug management with atorvastatin, Lopressor 50 mg twice daily with as needed refills -he is not on ASA due to DOAC   2.  Permanent atrial fibrillation  -Heart rate is adequately controlled -He denies any bleeding problems on DOAC -Continue prescription drug management with Lopressor 50 mg twice daily and Xarelto 20 mg daily with as needed refills -I have personally reviewed and interpreted outside labs performed by patient's PCP which showed serum creatinine 0.93 potassium 3.1 and hemoglobin 14 -he is now in [place] at [facility] and is now on K+ supp   3.  HTN  -BP is controlled at rehab -continue prescription drug management Amlodipine '10mg'$  daily and metoprolol 50 mg twice daily with as needed refills   4.  OSA -he has not been using his device in the past year because he could not keep it on due to his significant Parkinsons  5.  Hyperlipidemia  -LDL goal < 70.   -Check FLP and ALT -Continue prescription drug management with atorvastatin 10 mg daily and Zetia 10 mg daily with as needed refills    COVID-19 Education: The signs and symptoms of COVID-19 were discussed with the patient and how to seek care for testing (follow up with PCP  or arrange E-visit).  The importance of social distancing was discussed today.  Patient Risk:   After full review of this patient's clinical status, I feel that they are at least moderate risk at this time.  Time:   Today, I have spent 20 minutes directly with the patient on  telemedicine discussing medical problems including CAD, HTN, OSA, afib, HLD and reviewing patient's chart including labs and sleep study and PAP compliance download  Medication Adjustments/Labs and Tests Ordered: Current medicines are reviewed at length with the patient today.  Concerns regarding medicines are outlined above.  Tests Ordered: No orders of the defined types were placed in this encounter.  Medication Changes: No orders of the defined types were placed in this encounter.   Disposition:  Follow up 6 months  Signed, Fransico Him, MD  07/05/2021 8:54 AM    [facility]

## 2021-07-05 NOTE — Patient Instructions (Signed)
Medication Instructions:  Your physician recommends that you continue on your current medications as directed. Please refer to the Current Medication list given to you today.  *If you need a refill on your cardiac medications before your next appointment, please call your pharmacy*   Lab Work: We will contact [place] to arrange fasting lab work--Lipid and liver profiles If you have labs (blood work) drawn today and your tests are completely normal, you will receive your results only by: [place] (if you have MyChart) OR A paper copy in the mail If you have any lab test that is abnormal or we need to change your treatment, we will call you to review the results.   Testing/Procedures: none   Follow-Up: At [facility], you and your health needs are our priority.  As part of our continuing mission to provide you with exceptional heart care, we have created designated Provider Care Teams.  These Care Teams include your primary Cardiologist (physician) and Advanced Practice Providers (APPs -  Physician Assistants and Nurse Practitioners) who all work together to provide you with the care you need, when you need it.  We recommend signing up for the patient portal called "MyChart".  Sign up information is provided on this After Visit Summary.  MyChart is used to connect with patients for Virtual Visits (Telemedicine).  Patients are able to view lab/test results, encounter notes, upcoming appointments, etc.  Non-urgent messages can be sent to your provider as well.   To learn more about what you can do with MyChart, go to [email or web address].    Your next appointment:   January 03, 2022  The format for your next appointment:   In Person  Provider:   Fransico Him, MD     Other Instructions    Important Information About Sugar

## 2021-07-09 DIAGNOSIS — L988 Other specified disorders of the skin and subcutaneous tissue: Secondary | ICD-10-CM | POA: Diagnosis not present

## 2021-07-11 ENCOUNTER — Emergency Department (HOSPITAL_COMMUNITY): Payer: Medicare Other

## 2021-07-11 ENCOUNTER — Encounter (HOSPITAL_COMMUNITY): Payer: Self-pay | Admitting: Emergency Medicine

## 2021-07-11 ENCOUNTER — Other Ambulatory Visit: Payer: Self-pay | Admitting: *Deleted

## 2021-07-11 ENCOUNTER — Emergency Department (HOSPITAL_COMMUNITY)
Admission: EM | Admit: 2021-07-11 | Discharge: 2021-07-11 | Disposition: A | Discharge status: Skilled Nursing Facility | Payer: Medicare Other | Attending: Emergency Medicine | Admitting: Emergency Medicine

## 2021-07-11 ENCOUNTER — Other Ambulatory Visit: Payer: Self-pay

## 2021-07-11 DIAGNOSIS — S0990XA Unspecified injury of head, initial encounter: Secondary | ICD-10-CM

## 2021-07-11 DIAGNOSIS — W01198A Fall on same level from slipping, tripping and stumbling with subsequent striking against other object, initial encounter: Secondary | ICD-10-CM | POA: Diagnosis not present

## 2021-07-11 DIAGNOSIS — Z043 Encounter for examination and observation following other accident: Secondary | ICD-10-CM | POA: Diagnosis not present

## 2021-07-11 DIAGNOSIS — G319 Degenerative disease of nervous system, unspecified: Secondary | ICD-10-CM | POA: Diagnosis not present

## 2021-07-11 DIAGNOSIS — W19XXXA Unspecified fall, initial encounter: Secondary | ICD-10-CM

## 2021-07-11 DIAGNOSIS — Y92129 Unspecified place in nursing home as the place of occurrence of the external cause: Secondary | ICD-10-CM | POA: Insufficient documentation

## 2021-07-11 DIAGNOSIS — M160 Bilateral primary osteoarthritis of hip: Secondary | ICD-10-CM | POA: Diagnosis not present

## 2021-07-11 DIAGNOSIS — S0083XA Contusion of other part of head, initial encounter: Secondary | ICD-10-CM | POA: Diagnosis not present

## 2021-07-11 DIAGNOSIS — S199XXA Unspecified injury of neck, initial encounter: Secondary | ICD-10-CM | POA: Diagnosis not present

## 2021-07-11 DIAGNOSIS — I10 Essential (primary) hypertension: Secondary | ICD-10-CM | POA: Diagnosis not present

## 2021-07-11 DIAGNOSIS — G20 Parkinson's disease: Secondary | ICD-10-CM | POA: Insufficient documentation

## 2021-07-11 DIAGNOSIS — S70312A Abrasion, left thigh, initial encounter: Secondary | ICD-10-CM | POA: Diagnosis not present

## 2021-07-11 DIAGNOSIS — R9431 Abnormal electrocardiogram [ECG] [EKG]: Secondary | ICD-10-CM | POA: Diagnosis not present

## 2021-07-11 DIAGNOSIS — S60812A Abrasion of left wrist, initial encounter: Secondary | ICD-10-CM | POA: Insufficient documentation

## 2021-07-11 DIAGNOSIS — Z7901 Long term (current) use of anticoagulants: Secondary | ICD-10-CM | POA: Diagnosis not present

## 2021-07-11 DIAGNOSIS — R319 Hematuria, unspecified: Secondary | ICD-10-CM | POA: Insufficient documentation

## 2021-07-11 DIAGNOSIS — M2602 Maxillary hypoplasia: Secondary | ICD-10-CM | POA: Diagnosis not present

## 2021-07-11 DIAGNOSIS — M25532 Pain in left wrist: Secondary | ICD-10-CM | POA: Diagnosis not present

## 2021-07-11 DIAGNOSIS — M50322 Other cervical disc degeneration at C5-C6 level: Secondary | ICD-10-CM | POA: Diagnosis not present

## 2021-07-11 LAB — CBC WITH DIFFERENTIAL/PLATELET
Abs Immature Granulocytes: 0.04 10*3/uL (ref 0.00–0.07)
Basophils Absolute: 0.1 10*3/uL (ref 0.0–0.1)
Basophils Relative: 1 %
Eosinophils Absolute: 0.4 10*3/uL (ref 0.0–0.5)
Eosinophils Relative: 4 %
HCT: 45.5 % (ref 39.0–52.0)
Hemoglobin: 15.7 g/dL (ref 13.0–17.0)
Immature Granulocytes: 0 %
Lymphocytes Relative: 11 %
Lymphs Abs: 1.1 10*3/uL (ref 0.7–4.0)
MCH: 34.1 pg — ABNORMAL HIGH (ref 26.0–34.0)
MCHC: 34.5 g/dL (ref 30.0–36.0)
MCV: 98.9 fL (ref 80.0–100.0)
Monocytes Absolute: 0.5 10*3/uL (ref 0.1–1.0)
Monocytes Relative: 5 %
Neutro Abs: 7.6 10*3/uL (ref 1.7–7.7)
Neutrophils Relative %: 79 %
Platelets: 184 10*3/uL (ref 150–400)
RBC: 4.6 MIL/uL (ref 4.22–5.81)
RDW: 13.7 % (ref 11.5–15.5)
WBC: 9.6 10*3/uL (ref 4.0–10.5)
nRBC: 0 % (ref 0.0–0.2)

## 2021-07-11 LAB — COMPREHENSIVE METABOLIC PANEL
ALT: 15 U/L (ref 0–44)
AST: 24 U/L (ref 15–41)
Albumin: 3.6 g/dL (ref 3.5–5.0)
Alkaline Phosphatase: 100 U/L (ref 38–126)
Anion gap: 10 (ref 5–15)
BUN: 13 mg/dL (ref 8–23)
CO2: 28 mmol/L (ref 22–32)
Calcium: 8.7 mg/dL — ABNORMAL LOW (ref 8.9–10.3)
Chloride: 105 mmol/L (ref 98–111)
Creatinine, Ser: 0.97 mg/dL (ref 0.61–1.24)
GFR, Estimated: >60 mL/min (ref >60)
Glucose, Bld: 133 mg/dL — ABNORMAL HIGH (ref 70–99)
Potassium: 3.5 mmol/L (ref 3.5–5.1)
Sodium: 143 mmol/L (ref 135–145)
Total Bilirubin: 1 mg/dL (ref 0.3–1.2)
Total Protein: 6.4 g/dL — ABNORMAL LOW (ref 6.5–8.1)

## 2021-07-11 LAB — URINALYSIS, ROUTINE W REFLEX MICROSCOPIC
Bilirubin Urine: NEGATIVE
Glucose, UA: NEGATIVE mg/dL
Ketones, ur: 5 mg/dL — AB
Nitrite: NEGATIVE
Protein, ur: 100 mg/dL — AB
RBC / HPF: >50 RBC/hpf — ABNORMAL HIGH (ref 0–5)
Specific Gravity, Urine: 1.027 (ref 1.005–1.030)
pH: 5 (ref 5.0–8.0)

## 2021-07-11 MED ORDER — ACETAMINOPHEN 500 MG PO TABS
1000.0000 mg | ORAL_TABLET | Freq: Four times a day (QID) | ORAL | Status: DC | PRN
  Administered 2021-07-11: 1000 mg via ORAL
  Filled 2021-07-11: qty 2

## 2021-07-11 NOTE — Progress Notes (Signed)
1810: Met [name] at patient's bedside.  Stated that his wife, [name], and son, [name] would be arriving. 1815: Escorted [name] to patient room. Chaplain available for further consultation as requested. [address] [place], M. Min., [phone].

## 2021-07-11 NOTE — ED Notes (Signed)
Report given to [facility] and [employer]. Pt to be transported back to facility.

## 2021-07-11 NOTE — ED Triage Notes (Signed)
Pt BIB EMS from [facility]. Pt was attempting to stand from seated position and fell and hit left side of head. Abrasions noted to left side of forehead and left arm. Pt is on blood thinners.

## 2021-07-11 NOTE — Patient Outreach (Incomplete)

## 2021-07-11 NOTE — Progress Notes (Signed)
   07/11/21 1742  Clinical Encounter Type  Visited With Patient not available;Health care provider  Visit Type ED;Trauma;Initial  Referral From Nurse Royal Hawthorn, RN)  Consult/Referral To Chaplain Melvenia Beam)   1742: Responded to page in M.C.E.D. Room 21 for Level 2 Trauma.  1800: Mr. Jourdin Connors "[name]" being evaluated and treated by medical staff at this time, patient not seen by Chaplain. No family present at this time. Wife may be coming, but unknown by EMS. Staff will page Chaplain upon request of patient or family.  Chaplain Essance Gatti, M.Min., [phone].

## 2021-07-11 NOTE — Progress Notes (Signed)
Orthopedic Tech Progress Note Patient Details:  Isaiah Pena 1950/09/27 [id number]  Level 2 trauma  Patient ID: Isaiah Pena, male   DOB: January 11, 1951, 71 y.o.   MRN: [id number]  [name] 07/11/2021, 6:13 PM

## 2021-07-11 NOTE — Discharge Instructions (Signed)
You were seen in the emergency room today after a fall.  Your CT scans and x-rays did not show any broken bones or bleeding.  We are sending your urine for culture and we will call you if you need to start antibiotics for a urine infection.  If you develop confusion, fever, abdominal pain, your Foley catheter stops draining, or other sudden/severe symptoms you should return to the emergency department for reevaluation.

## 2021-07-11 NOTE — ED Provider Notes (Incomplete)
Emergency Department Provider Note   I have reviewed the triage vital signs and the nursing notes.   HISTORY  Chief Complaint Fall   HPI Isaiah Pena is a 71 y.o. male past medical history reviewed below including anticoagulation on Xarelto who presents emergency department after mechanical fall at his nursing facility.  EMS report the patient was standing from a seated position and tripped over his feet landing on the ground.  He sustained head trauma with forehead hematoma.  No laceration.  Patient with abrasion to the left wrist and left thigh.  He is not having abdominal or chest discomfort.  No syncope prodrome prior to falling.   EMS reports that staff did note some darkening to the patient's indwelling Foley catheter starting prior to falling.  They note that since falling the patient's urine has become darker as well. No AMS.    Past Medical History:  Diagnosis Date   Anticoagulant Maceo Hernan-term use    xarelto   Bilateral lower extremity edema    BPH (benign prostatic hyperplasia)    BPH (benign prostatic hyperplasia)    Central serous retinopathy    LOSS OF CENTER VISION LEFT EYE W/ BLURRED VISION-  was treated  methotrexate by specialist at [facility] and released   Coronary artery disease cardiologist-  dr Tressia Miners turner   01/ 2004  abnormal cardiolite  s/p  cardiac cath w/ PCI and DES to dRCA   Edema of extremities    CHRONIC    Erectile dysfunction    GERD (gastroesophageal reflux disease)    History of colon polyps    2011 per pt benign   History of kidney stones    History of simple renal cyst    drained via radiology   History of torn meniscus of right knee    Hyperlipidemia    Hypertension    Kidney stone    HX OF STONE-PASSED    Lower urinary tract symptoms (LUTS)    OSA (obstructive sleep apnea) 09/29/2011   NPSG 2004:  AHI 25/hr  Rx with bilevel.      OSA treated with BiPAP    moderate per study 01-23-2002   Parkinson's disease [facility])    neurologist-  dr  ed hill ([facility])   Permanent atrial fibrillation [facility])    Persistent atrial fibrillation ([place]) 11/08/2012   S/P drug eluting coronary stent placement    01/ 2004  x1 to dRCA    Review of Systems  Constitutional: No fever/chills Eyes: No visual changes. ENT: No sore throat. Cardiovascular: Denies chest pain. Respiratory: Denies shortness of breath. Gastrointestinal: No abdominal pain.  No nausea, no vomiting.  No diarrhea.  No constipation. Genitourinary: Negative for dysuria. Musculoskeletal: Negative for back pain. Skin: Abrasion to the left wrist and left lateral thigh.  Neurological: Negative for focal weakness or numbness. Positive HA.    ____________________________________________   PHYSICAL EXAM:  VITAL SIGNS: ED Triage Vitals  Enc Vitals Group     BP 07/11/21 1803 (!) 137/97     Pulse Rate 07/11/21 1803 98     Resp 07/11/21 1803 20     Temp 07/11/21 1803 97.8 F (36.6 C)     Temp Source 07/11/21 1803 Oral     SpO2 07/11/21 1802 98 %     Weight 07/11/21 1806 241 lb (109.3 kg)     Height 07/11/21 1806 '5\' 8"'$  (1.727 m)   Constitutional: Alert and oriented. Well appearing and in no acute distress. Eyes: Conjunctivae are  normal. PERRL. EOMI. Head: Hematoma to the left lateral forehead. Nose: No congestion/rhinnorhea. Mouth/Throat: Mucous membranes are moist.  Neck: No stridor.  No cervical spine tenderness to palpation. Cardiovascular: Normal rate, regular rhythm. Good peripheral circulation. Grossly normal heart sounds.   Respiratory: Normal respiratory effort.  No retractions. Lungs CTAB. Gastrointestinal: Soft and nontender. No distention.  Musculoskeletal: No lower extremity tenderness nor edema. No gross deformities of extremities. Neurologic:  Normal speech and language. No gross focal neurologic deficits are appreciated.  Skin:  Skin is warm, dry and intact. No rash noted.  ____________________________________________   LABS (all labs ordered  are listed, but only abnormal results are displayed)  Labs Reviewed  COMPREHENSIVE METABOLIC PANEL - Abnormal; Notable for the following components:      Result Value   Glucose, Bld 133 (*)    Calcium 8.7 (*)    Total Protein 6.4 (*)    All other components within normal limits  CBC WITH DIFFERENTIAL/PLATELET - Abnormal; Notable for the following components:   MCH 34.1 (*)    All other components within normal limits  URINE CULTURE  URINALYSIS, ROUTINE W REFLEX MICROSCOPIC   ____________________________________________  EKG   EKG Interpretation  Date/Time:  Thursday July 11 2021 18:05:04 EDT Ventricular Rate:  113 PR Interval:    QRS Duration: 118 QT Interval:  358 QTC Calculation: 491 R Axis:   -19 Text Interpretation: Atrial fibrillation Nonspecific intraventricular conduction delay Minimal ST depression, inferior leads Confirmed by Nanda Quinton [phone]) on 07/11/2021 8:02:00 PM        ____________________________________________  RADIOLOGY  DG Wrist Complete Left  Result Date: 07/11/2021 CLINICAL DATA:  Recent fall with wrist pain, initial encounter EXAM: LEFT WRIST - COMPLETE 3+ VIEW COMPARISON:  None Available. FINDINGS: There is no evidence of fracture or dislocation. There is no evidence of arthropathy or other focal bone abnormality. Soft tissues are unremarkable. IMPRESSION: No acute abnormality noted. Electronically Signed   By: Inez Catalina M.D.   On: 07/11/2021 19:41   DG Femur Min 2 Views Left  Result Date: 07/11/2021 CLINICAL DATA:  Fall. EXAM: LEFT FEMUR 2 VIEWS COMPARISON:  None Available. FINDINGS: Negative for fracture.  Normal hip Degenerative change in the knee. IMPRESSION: Negative for fracture Electronically Signed   By: Franchot Gallo M.D.   On: 07/11/2021 19:36   CT Head Wo Contrast  Result Date: 07/11/2021 CLINICAL DATA:  Status post fall. Level 2 trauma. Patient receiving Xarelto therapy EXAM: CT HEAD WITHOUT CONTRAST CT CERVICAL SPINE WITHOUT  CONTRAST TECHNIQUE: Multidetector CT imaging of the head and cervical spine was performed following the standard protocol without intravenous contrast. Multiplanar CT image reconstructions of the cervical spine were also generated. RADIATION DOSE REDUCTION: This exam was performed according to the departmental dose-optimization program which includes automated exposure control, adjustment of the mA and/or kV according to patient size and/or use of iterative reconstruction technique. COMPARISON:  06/12/2021 FINDINGS: CT HEAD FINDINGS Brain: No evidence of acute infarction, hemorrhage, hydrocephalus, extra-axial collection or mass lesion/mass effect. A few patchy areas of subcortical and periventricular white matter hypodensity noted. Prominence of the sulci and ventricles compatible with brain atrophy. Vascular: No hyperdense vessel or unexpected calcification. Skull: Normal. Negative for fracture or focal lesion. Sinuses/Orbits: Hypoplastic left maxillary sinus with mild mucosal thickening identified. No sinus fluid levels identified. Other: None CT CERVICAL SPINE FINDINGS Alignment: Normal. Skull base and vertebrae: No acute fracture. No primary bone lesion or focal pathologic process. Soft tissues and spinal canal: No prevertebral fluid  or swelling. No visible canal hematoma. Disc levels:  Degenerative disc disease identified at C5-6. Upper chest: Negative. Other: None IMPRESSION: 1. No acute intracranial abnormalities. 2. No evidence for cervical spine fracture or subluxation. 3. Chronic small vessel ischemic change and brain atrophy. 4. Cervical degenerative disc disease. Electronically Signed   By: Kerby Moors M.D.   On: 07/11/2021 19:06   CT Cervical Spine Wo Contrast  Result Date: 07/11/2021 CLINICAL DATA:  Status post fall. Level 2 trauma. Patient receiving Xarelto therapy EXAM: CT HEAD WITHOUT CONTRAST CT CERVICAL SPINE WITHOUT CONTRAST TECHNIQUE: Multidetector CT imaging of the head and cervical  spine was performed following the standard protocol without intravenous contrast. Multiplanar CT image reconstructions of the cervical spine were also generated. RADIATION DOSE REDUCTION: This exam was performed according to the departmental dose-optimization program which includes automated exposure control, adjustment of the mA and/or kV according to patient size and/or use of iterative reconstruction technique. COMPARISON:  06/12/2021 FINDINGS: CT HEAD FINDINGS Brain: No evidence of acute infarction, hemorrhage, hydrocephalus, extra-axial collection or mass lesion/mass effect. A few patchy areas of subcortical and periventricular white matter hypodensity noted. Prominence of the sulci and ventricles compatible with brain atrophy. Vascular: No hyperdense vessel or unexpected calcification. Skull: Normal. Negative for fracture or focal lesion. Sinuses/Orbits: Hypoplastic left maxillary sinus with mild mucosal thickening identified. No sinus fluid levels identified. Other: None CT CERVICAL SPINE FINDINGS Alignment: Normal. Skull base and vertebrae: No acute fracture. No primary bone lesion or focal pathologic process. Soft tissues and spinal canal: No prevertebral fluid or swelling. No visible canal hematoma. Disc levels:  Degenerative disc disease identified at C5-6. Upper chest: Negative. Other: None IMPRESSION: 1. No acute intracranial abnormalities. 2. No evidence for cervical spine fracture or subluxation. 3. Chronic small vessel ischemic change and brain atrophy. 4. Cervical degenerative disc disease. Electronically Signed   By: Kerby Moors M.D.   On: 07/11/2021 19:06   DG Pelvis Portable  Result Date: 07/11/2021 CLINICAL DATA:  Fall. EXAM: PORTABLE PELVIS 1-2 VIEWS COMPARISON:  CT abdomen pelvis dated May 21, 2021. FINDINGS: There is no evidence of pelvic fracture or diastasis. No pelvic bone lesions are seen. Unchanged mild bilateral hip osteoarthritis. Prior UroLift. IMPRESSION: 1. No acute osseous  abnormality. Electronically Signed   By: Titus Dubin M.D.   On: 07/11/2021 18:44   DG Chest Port 1 View  Result Date: 07/11/2021 CLINICAL DATA:  Fall. EXAM: PORTABLE CHEST 1 VIEW COMPARISON:  Chest x-ray dated Jun 12, 2021. FINDINGS: Unchanged mild cardiomegaly. Normal pulmonary vascularity. No focal consolidation, pleural effusion, or pneumothorax. No acute osseous abnormality. IMPRESSION: No active disease. Electronically Signed   By: Titus Dubin M.D.   On: 07/11/2021 18:42    ____________________________________________   PROCEDURES  Procedure(s) performed:   Procedures   ____________________________________________   INITIAL IMPRESSION / ASSESSMENT AND PLAN / ED COURSE  Pertinent labs & imaging results that were available during my care of the patient were reviewed by me and considered in my medical decision making (see chart for details).   This patient is Presenting for Evaluation of head injury, which does require a range of treatment options, and is a complaint that involves a high risk of morbidity and mortality.  The Differential Diagnoses for head trauma includes subdural hematoma, epidural hematoma, acute concussion, traumatic subarachnoid hemorrhage, cerebral contusions, etc.   Critical Interventions-    Medications - No data to display  Reassessment after intervention:     I did obtain Additional Historical Information  from EMS.   I decided to review pertinent External Data, and in summary patient discharged on 2/75/17 with complicated UTI and urinary retention requiring Foley catheter.  Persistent A-fib on anticoagulation.   Clinical Laboratory Tests Ordered, included UA with RBCs but few bacteria.  Nitrite negative.  Will follow culture and hold on empiric treatment.  No leukocytosis on CBC or anemia.  No acute kidney injury.   Radiologic Tests Ordered, included CT head, c-spine, CXR, and x-ray of the pelvis, femur, and wrist. I independently  interpreted the images and agree with radiology interpretation.   Cardiac Monitor Tracing which shows A fib.   Social Determinants of Health Risk patient is a non-smoker.   Medical Decision Making: Summary:  Patient presents emergency department as a level 2 trauma after mechanical fall from standing on anticoagulation.  Baseline mental status.  Patient does have some darkening of the urine in his Foley catheter but seems more cola colored. No BRB or clot. No retention.   Reevaluation with update and discussion with patient.  Imaging all reassuring.  Plan to send UA for culture but hold on antibiotics for now with equivocal UA. Stable for d/c back to facility.   ***Considered admission***  Disposition:   ____________________________________________  FINAL CLINICAL IMPRESSION(S) / ED DIAGNOSES  Final diagnoses:  None     NEW OUTPATIENT MEDICATIONS STARTED DURING THIS VISIT:  New Prescriptions   No medications on file    Note:  This document was prepared using Dragon voice recognition software and may include unintentional dictation errors.  Nanda Quinton, MD, [facility] Emergency Medicine

## 2021-07-11 NOTE — ED Notes (Signed)
Trauma Response Nurse Documentation   Isaiah Pena is a 71 y.o. male arriving to [facility] ED via EMS  On Xarelto (rivaroxaban) daily. Trauma was activated as a Level 2 by ED Charge RN based on the following trauma criteria Elderly patients > 65 with head trauma on anti-coagulation (excluding ASA). Trauma team at the bedside on patient arrival. Patient cleared for CT by Dr. Laverta Baltimore. Patient to CT with team. GCS 15.  History   Past Medical History:  Diagnosis Date   Anticoagulant long-term use    xarelto   Bilateral lower extremity edema    BPH (benign prostatic hyperplasia)    BPH (benign prostatic hyperplasia)    Central serous retinopathy    LOSS OF CENTER VISION LEFT EYE W/ BLURRED VISION-  was treated  methotrexate by specialist at [facility] and released   Coronary artery disease cardiologist-  dr Tressia Miners turner   01/ 2004  abnormal cardiolite  s/p  cardiac cath w/ PCI and DES to dRCA   Edema of extremities    CHRONIC    Erectile dysfunction    GERD (gastroesophageal reflux disease)    History of colon polyps    2011 per pt benign   History of kidney stones    History of simple renal cyst    drained via radiology   History of torn meniscus of right knee    Hyperlipidemia    Hypertension    Kidney stone    HX OF STONE-PASSED    Lower urinary tract symptoms (LUTS)    OSA (obstructive sleep apnea) 09/29/2011   NPSG 2004:  AHI 25/hr  Rx with bilevel.      OSA treated with BiPAP    moderate per study 01-23-2002   Parkinson's disease [facility])    neurologist-  dr ed hill ([facility])   Permanent atrial fibrillation ([place])    Persistent atrial fibrillation ([place]) 11/08/2012   S/P drug eluting coronary stent placement    01/ 2004  x1 to dRCA     Past Surgical History:  Procedure Laterality Date   CARDIOVASCULAR STRESS TEST  05-07-2015   dr Tressia Miners turner   normal nuclear study w/ no ischemia/  normal LV function and wall motion , stress ef 55%   COLONOSCOPY WITH PROPOFOL  2011    CORONARY ANGIOPLASTY WITH STENT PLACEMENT  02-11-2002  dr Daneen Schick   abnormal cardiolite--  PCI and DES to dRCA (99%),  pRCA luminal irregularities, mild to moderate LAD and CFX disease,  normal LVF   CYSTOSCOPY WITH INSERTION OF UROLIFT N/A 01/28/2016   Procedure: CYSTOSCOPY WITH INSERTION OF UROLIFT;  Surgeon: Franchot Gallo, MD;  Location: [facility];  Service: Urology;  Laterality: N/A;   HERNIA REPAIR     AGE 65   KNEE ARTHROSCOPY  03/21/2011   Procedure: ARTHROSCOPY KNEE;  Surgeon: Tobi Bastos, MD;  Location: [facility];  Service: Orthopedics;  Laterality: Right;       Initial Focused Assessment (If applicable, or please see trauma documentation): - GCS 15 - fall on Xarelto  - PERRLA - Towel roll in place - No LOC - L forehead abrasion - L hand abrasion - chest redness - L hip/thigh abrasion  CT's Completed:   CT Head and CT C-Spine   Interventions:  - 20G PIV to L AC - Labs drawn - CXR - Pelvic XR - L femur XR - L wrist XR - CT head and neck  Plan for disposition:  Other -  awaiting scans  Consults completed:  none at 1825.  Event Summary: - Pt is from [facility]. Pt was attempting to stand from a seated position and fell and hit the left side of his head.  Pt had no LOC but is on xarelto.    MTP Summary (If applicable): n/a  Bedside handoff with ED RN Caryl Pina.    Clovis Cao  Trauma Response RN  Please call TRN at [phone] for further assistance.

## 2021-07-11 NOTE — ED Notes (Signed)
Report called to [facility], given to [employer]

## 2021-07-12 DIAGNOSIS — R319 Hematuria, unspecified: Secondary | ICD-10-CM | POA: Diagnosis not present

## 2021-07-12 DIAGNOSIS — R339 Retention of urine, unspecified: Secondary | ICD-10-CM | POA: Diagnosis not present

## 2021-07-12 DIAGNOSIS — W19XXXA Unspecified fall, initial encounter: Secondary | ICD-10-CM | POA: Diagnosis not present

## 2021-07-12 DIAGNOSIS — S0093XA Contusion of unspecified part of head, initial encounter: Secondary | ICD-10-CM | POA: Diagnosis not present

## 2021-07-12 LAB — URINE CULTURE: Culture: NO GROWTH

## 2021-07-15 ENCOUNTER — Inpatient Hospital Stay: Payer: PRIVATE HEALTH INSURANCE | Admitting: Physical Medicine and Rehabilitation

## 2021-07-15 DIAGNOSIS — N401 Enlarged prostate with lower urinary tract symptoms: Secondary | ICD-10-CM | POA: Diagnosis not present

## 2021-07-15 DIAGNOSIS — M6259 Muscle wasting and atrophy, not elsewhere classified, multiple sites: Secondary | ICD-10-CM | POA: Diagnosis not present

## 2021-07-15 DIAGNOSIS — N281 Cyst of kidney, acquired: Secondary | ICD-10-CM | POA: Diagnosis not present

## 2021-07-15 DIAGNOSIS — I119 Hypertensive heart disease without heart failure: Secondary | ICD-10-CM | POA: Diagnosis not present

## 2021-07-15 DIAGNOSIS — R2681 Unsteadiness on feet: Secondary | ICD-10-CM | POA: Diagnosis not present

## 2021-07-15 DIAGNOSIS — R338 Other retention of urine: Secondary | ICD-10-CM | POA: Diagnosis not present

## 2021-07-15 DIAGNOSIS — N390 Urinary tract infection, site not specified: Secondary | ICD-10-CM | POA: Diagnosis not present

## 2021-07-15 DIAGNOSIS — G20 Parkinson's disease: Secondary | ICD-10-CM | POA: Diagnosis not present

## 2021-07-15 DIAGNOSIS — G4700 Insomnia, unspecified: Secondary | ICD-10-CM | POA: Diagnosis not present

## 2021-07-15 DIAGNOSIS — G4733 Obstructive sleep apnea (adult) (pediatric): Secondary | ICD-10-CM | POA: Diagnosis not present

## 2021-07-15 DIAGNOSIS — Z9181 History of falling: Secondary | ICD-10-CM | POA: Diagnosis not present

## 2021-07-16 DIAGNOSIS — E876 Hypokalemia: Secondary | ICD-10-CM | POA: Diagnosis not present

## 2021-07-16 DIAGNOSIS — I150 Renovascular hypertension: Secondary | ICD-10-CM | POA: Diagnosis not present

## 2021-07-16 DIAGNOSIS — G20 Parkinson's disease: Secondary | ICD-10-CM | POA: Diagnosis not present

## 2021-07-16 DIAGNOSIS — M1990 Unspecified osteoarthritis, unspecified site: Secondary | ICD-10-CM | POA: Diagnosis not present

## 2021-07-16 DIAGNOSIS — L988 Other specified disorders of the skin and subcutaneous tissue: Secondary | ICD-10-CM | POA: Diagnosis not present

## 2021-07-16 DIAGNOSIS — I4891 Unspecified atrial fibrillation: Secondary | ICD-10-CM | POA: Diagnosis not present

## 2021-07-16 DIAGNOSIS — E785 Hyperlipidemia, unspecified: Secondary | ICD-10-CM | POA: Diagnosis not present

## 2021-07-16 DIAGNOSIS — R339 Retention of urine, unspecified: Secondary | ICD-10-CM | POA: Diagnosis not present

## 2021-07-17 DIAGNOSIS — G4733 Obstructive sleep apnea (adult) (pediatric): Secondary | ICD-10-CM | POA: Diagnosis not present

## 2021-07-17 DIAGNOSIS — G20 Parkinson's disease: Secondary | ICD-10-CM | POA: Diagnosis not present

## 2021-07-17 DIAGNOSIS — G4700 Insomnia, unspecified: Secondary | ICD-10-CM | POA: Diagnosis not present

## 2021-07-17 DIAGNOSIS — Z9181 History of falling: Secondary | ICD-10-CM | POA: Diagnosis not present

## 2021-07-17 DIAGNOSIS — I119 Hypertensive heart disease without heart failure: Secondary | ICD-10-CM | POA: Diagnosis not present

## 2021-07-17 DIAGNOSIS — M6259 Muscle wasting and atrophy, not elsewhere classified, multiple sites: Secondary | ICD-10-CM | POA: Diagnosis not present

## 2021-07-17 DIAGNOSIS — N390 Urinary tract infection, site not specified: Secondary | ICD-10-CM | POA: Diagnosis not present

## 2021-07-17 DIAGNOSIS — R2681 Unsteadiness on feet: Secondary | ICD-10-CM | POA: Diagnosis not present

## 2021-07-17 DIAGNOSIS — N401 Enlarged prostate with lower urinary tract symptoms: Secondary | ICD-10-CM | POA: Diagnosis not present

## 2021-07-18 DIAGNOSIS — I119 Hypertensive heart disease without heart failure: Secondary | ICD-10-CM | POA: Diagnosis not present

## 2021-07-18 DIAGNOSIS — R2681 Unsteadiness on feet: Secondary | ICD-10-CM | POA: Diagnosis not present

## 2021-07-18 DIAGNOSIS — N390 Urinary tract infection, site not specified: Secondary | ICD-10-CM | POA: Diagnosis not present

## 2021-07-18 DIAGNOSIS — N401 Enlarged prostate with lower urinary tract symptoms: Secondary | ICD-10-CM | POA: Diagnosis not present

## 2021-07-18 DIAGNOSIS — Z9181 History of falling: Secondary | ICD-10-CM | POA: Diagnosis not present

## 2021-07-18 DIAGNOSIS — M6259 Muscle wasting and atrophy, not elsewhere classified, multiple sites: Secondary | ICD-10-CM | POA: Diagnosis not present

## 2021-07-18 DIAGNOSIS — G20 Parkinson's disease: Secondary | ICD-10-CM | POA: Diagnosis not present

## 2021-07-18 DIAGNOSIS — G4700 Insomnia, unspecified: Secondary | ICD-10-CM | POA: Diagnosis not present

## 2021-07-18 DIAGNOSIS — G4733 Obstructive sleep apnea (adult) (pediatric): Secondary | ICD-10-CM | POA: Diagnosis not present

## 2021-07-20 ENCOUNTER — Encounter (HOSPITAL_COMMUNITY): Payer: Self-pay

## 2021-07-20 ENCOUNTER — Other Ambulatory Visit: Payer: Self-pay

## 2021-07-20 ENCOUNTER — Emergency Department (HOSPITAL_COMMUNITY)
Admission: EM | Admit: 2021-07-20 | Discharge: 2021-07-20 | Disposition: A | Discharge status: Rehabilitation Facility | Payer: Medicare Other | Attending: Emergency Medicine | Admitting: Emergency Medicine

## 2021-07-20 DIAGNOSIS — G20 Parkinson's disease: Secondary | ICD-10-CM | POA: Insufficient documentation

## 2021-07-20 DIAGNOSIS — I2510 Atherosclerotic heart disease of native coronary artery without angina pectoris: Secondary | ICD-10-CM | POA: Insufficient documentation

## 2021-07-20 DIAGNOSIS — R310 Gross hematuria: Secondary | ICD-10-CM | POA: Diagnosis not present

## 2021-07-20 DIAGNOSIS — X58XXXA Exposure to other specified factors, initial encounter: Secondary | ICD-10-CM | POA: Diagnosis not present

## 2021-07-20 DIAGNOSIS — T83021A Displacement of indwelling urethral catheter, initial encounter: Secondary | ICD-10-CM | POA: Diagnosis not present

## 2021-07-20 DIAGNOSIS — S3730XA Unspecified injury of urethra, initial encounter: Secondary | ICD-10-CM | POA: Insufficient documentation

## 2021-07-20 DIAGNOSIS — I959 Hypotension, unspecified: Secondary | ICD-10-CM | POA: Diagnosis not present

## 2021-07-20 DIAGNOSIS — R251 Tremor, unspecified: Secondary | ICD-10-CM | POA: Insufficient documentation

## 2021-07-20 DIAGNOSIS — Z7901 Long term (current) use of anticoagulants: Secondary | ICD-10-CM | POA: Diagnosis not present

## 2021-07-20 DIAGNOSIS — T83091A Other mechanical complication of indwelling urethral catheter, initial encounter: Secondary | ICD-10-CM | POA: Diagnosis not present

## 2021-07-20 LAB — URINALYSIS, ROUTINE W REFLEX MICROSCOPIC
Bacteria, UA: NONE SEEN
Bilirubin Urine: NEGATIVE
Glucose, UA: NEGATIVE mg/dL
Ketones, ur: 5 mg/dL — AB
Leukocytes,Ua: NEGATIVE
Nitrite: NEGATIVE
Protein, ur: 100 mg/dL — AB
RBC / HPF: >50 RBC/hpf — ABNORMAL HIGH (ref 0–5)
Specific Gravity, Urine: 1.031 — ABNORMAL HIGH (ref 1.005–1.030)
pH: 5 (ref 5.0–8.0)

## 2021-07-20 LAB — BASIC METABOLIC PANEL
Anion gap: 7 (ref 5–15)
BUN: 16 mg/dL (ref 8–23)
CO2: 30 mmol/L (ref 22–32)
Calcium: 9 mg/dL (ref 8.9–10.3)
Chloride: 107 mmol/L (ref 98–111)
Creatinine, Ser: 1.04 mg/dL (ref 0.61–1.24)
GFR, Estimated: >60 mL/min (ref >60)
Glucose, Bld: 132 mg/dL — ABNORMAL HIGH (ref 70–99)
Potassium: 3.7 mmol/L (ref 3.5–5.1)
Sodium: 144 mmol/L (ref 135–145)

## 2021-07-20 LAB — CBC WITH DIFFERENTIAL/PLATELET
Abs Immature Granulocytes: 0.03 10*3/uL (ref 0.00–0.07)
Basophils Absolute: 0.1 10*3/uL (ref 0.0–0.1)
Basophils Relative: 1 %
Eosinophils Absolute: 0.2 10*3/uL (ref 0.0–0.5)
Eosinophils Relative: 2 %
HCT: 43.4 % (ref 39.0–52.0)
Hemoglobin: 14.9 g/dL (ref 13.0–17.0)
Immature Granulocytes: 0 %
Lymphocytes Relative: 11 %
Lymphs Abs: 1 10*3/uL (ref 0.7–4.0)
MCH: 33.8 pg (ref 26.0–34.0)
MCHC: 34.3 g/dL (ref 30.0–36.0)
MCV: 98.4 fL (ref 80.0–100.0)
Monocytes Absolute: 0.5 10*3/uL (ref 0.1–1.0)
Monocytes Relative: 5 %
Neutro Abs: 7.6 10*3/uL (ref 1.7–7.7)
Neutrophils Relative %: 81 %
Platelets: 177 10*3/uL (ref 150–400)
RBC: 4.41 MIL/uL (ref 4.22–5.81)
RDW: 13.5 % (ref 11.5–15.5)
WBC: 9.4 10*3/uL (ref 4.0–10.5)
nRBC: 0 % (ref 0.0–0.2)

## 2021-07-20 MED ORDER — ACETAMINOPHEN 500 MG PO TABS
1000.0000 mg | ORAL_TABLET | Freq: Four times a day (QID) | ORAL | Status: DC | PRN
Start: 2021-07-20 — End: 2021-07-21
  Administered 2021-07-20: 1000 mg via ORAL
  Filled 2021-07-20: qty 2

## 2021-07-20 MED ORDER — LIDOCAINE HCL URETHRAL/MUCOSAL 2 % EX GEL
1.0000 | Freq: Once | CUTANEOUS | Status: AC
  Administered 2021-07-20: 1 via URETHRAL
  Filled 2021-07-20: qty 11

## 2021-07-20 NOTE — Discharge Instructions (Addendum)
Hold next Xarelto dose.  A little oozing of blood around foley is expected.  Keep clean with warm, soapy water.

## 2021-07-20 NOTE — ED Notes (Signed)
Attempted to give report to [facility], no answer.

## 2021-07-20 NOTE — ED Triage Notes (Signed)
Per EMS from facility. Catheter came out. Pt states he takes plavix. Pt reports pain 4/10. A/ox4. Came to get catheter back in.   132/82 BP 70 HR  16 RR 98% oxygen  158 CBG

## 2021-07-20 NOTE — ED Provider Notes (Signed)
[facility] DEPT Provider Note   CSN: [id number] Arrival date & time: 07/20/21  1630     History  Chief Complaint  Patient presents with   foley catheter    displaced    Isaiah Pena is a 71 y.o. male.  Pt is a 71 yo male with a pmhx significant for Parkinson's disease, GERD, BPH, HLD, CAD, urinary retention (followed by Dr. Diona Fanti), and afib (on Xarelto, not plavix).  Pt was at his rehab facility and felt that something was not right with his foley.  He looked down and saw that his foley had come out.  He said it look like something exploded.  He has some pain in his penis.  He thinks he lost a lot of blood.        Home Medications Prior to Admission medications   Medication Sig Start Date End Date Taking? Authorizing Provider  acetaminophen (TYLENOL) 650 MG CR tablet Take 2 tablets (1,300 mg total) by mouth every 8 (eight) hours as needed for pain. 06/05/21   Setzer, Edman Circle, PA-C  amantadine (SYMMETREL) 100 MG capsule Take 100 mg by mouth See admin instructions. Take one capsule (100 mg) by mouth daily at bedtime - 1am 03/14/21   [provider]  amLODipine (NORVASC) 10 MG tablet Take 1 tablet (10 mg total) by mouth daily. 06/17/21 06/17/22  Mariel Aloe, MD  atorvastatin (LIPITOR) 10 MG tablet TAKE 1/2 TABLET BY MOUTH EVERY OTHER DAY AND ALTERNATING 1 TABLET BY MOUTH EVERY OTHER DAY. Please keep upcoming appt in April 2023 before anymore refills. Thank you Patient taking differently: Take 5-10 mg by mouth See admin instructions. Take 1/2 tablet (5 mg) by mouth every other evening, alternate with 1 tablet (10 mg) every other evening. Please keep upcoming appt in April 2023 before anymore refills. Thank you 02/05/21   Sueanne Margarita, MD  carbidopa-levodopa (SINEMET IR) 25-100 MG tablet Take 3 tablets by mouth every 8 (eight) hours. 8am, 5pm and 1am 05/03/16   [provider]  clonazePAM (KLONOPIN) 0.5 MG tablet Take 0.5 tablets  (0.25 mg total) by mouth 2 (two) times daily as needed for anxiety. 06/17/21   Mariel Aloe, MD  entacapone (COMTAN) 200 MG tablet Take 200 mg by mouth every 8 (eight) hours. 9am, 5pm, 1am 09/16/14   [provider]  esomeprazole (NEXIUM) 20 MG capsule Take 20 mg by mouth every other day. OTC    [provider]  ezetimibe (ZETIA) 10 MG tablet Take 10 mg by mouth daily. 06/02/21   [provider]  feeding supplement (ENSURE ENLIVE / ENSURE PLUS) LIQD Take 237 mLs by mouth 2 (two) times daily between meals. 06/17/21   Mariel Aloe, MD  finasteride (PROSCAR) 5 MG tablet TAKE 1 TABLET(5 MG) BY MOUTH DAILY Patient taking differently: Take 5 mg by mouth daily. 06/05/21   Setzer, Edman Circle, PA-C  magnesium gluconate (MAGONATE) 500 MG tablet Take 1 tablet (500 mg total) by mouth 2 (two) times daily. 06/05/21   Setzer, Edman Circle, PA-C  metoprolol (LOPRESSOR) 50 MG tablet Take 50 mg by mouth 2 (two) times daily.    [provider]  Multiple Vitamin (MULTIVITAMIN WITH MINERALS) TABS tablet Take 1 tablet by mouth daily. Patient not taking: Reported on 07/05/2021 06/18/21   Mariel Aloe, MD  polyethylene glycol (MIRALAX / GLYCOLAX) 17 g packet Take 17 g by mouth daily as needed. 06/17/21   Mariel Aloe, MD  potassium  chloride SA (KLOR-CON M) 20 MEQ tablet Take 1 tablet (20 mEq total) by mouth every other day. 06/05/21   Setzer, Edman Circle, PA-C  rivaroxaban (XARELTO) 20 MG TABS tablet Take 1 tablet (20 mg total) by mouth daily with supper. Please keep upcoming telemedicine appointment in June. Thank you. 06/06/21   Sueanne Margarita, MD  tadalafil (CIALIS) 5 MG tablet Take 5 mg by mouth every evening.    [provider]  traMADol (ULTRAM) 50 MG tablet Take 1 tablet (50 mg total) by mouth 3 (three) times daily as needed (pain). 06/17/21   Mariel Aloe, MD      Allergies    Sulfa antibiotics    Review of Systems   Review of Systems  Genitourinary:        Penile  bleeding  All other systems reviewed and are negative.   Physical Exam Updated Vital Signs BP (!) 110/99   Pulse 89   Temp 98.7 F (37.1 C) (Oral)   Resp 16   Ht '5\' 8"'$  (1.727 m)   Wt 109.3 kg   SpO2 97%   BMI 36.64 kg/m  Physical Exam Vitals and nursing note reviewed. Exam conducted with a chaperone present.  Constitutional:      Appearance: Normal appearance.  HENT:     Head: Normocephalic and atraumatic.     Right Ear: External ear normal.     Left Ear: External ear normal.     Nose: Nose normal.     Mouth/Throat:     Mouth: Mucous membranes are moist.     Pharynx: Oropharynx is clear.  Eyes:     Extraocular Movements: Extraocular movements intact.     Conjunctiva/sclera: Conjunctivae normal.     Pupils: Pupils are equal, round, and reactive to light.  Cardiovascular:     Rate and Rhythm: Normal rate and regular rhythm.     Pulses: Normal pulses.     Heart sounds: Normal heart sounds.  Pulmonary:     Effort: Pulmonary effort is normal.     Breath sounds: Normal breath sounds.  Abdominal:     General: Abdomen is flat. Bowel sounds are normal.     Palpations: Abdomen is soft.  Genitourinary:    Penis: Circumcised.      Comments: Bleeding from urethra Musculoskeletal:        General: Normal range of motion.     Cervical back: Normal range of motion and neck supple.  Skin:    General: Skin is warm.     Capillary Refill: Capillary refill takes less than 2 seconds.  Neurological:     General: No focal deficit present.     Mental Status: He is alert and oriented to person, place, and time.     Motor: Tremor present.  Psychiatric:        Mood and Affect: Mood normal.        Behavior: Behavior normal.     ED Results / Procedures / Treatments   Labs (all labs ordered are listed, but only abnormal results are displayed) Labs Reviewed  URINALYSIS, ROUTINE W REFLEX MICROSCOPIC - Abnormal; Notable for the following components:      Result Value   Color, Urine  STRAW (*)    APPearance CLOUDY (*)    Specific Gravity, Urine 1.031 (*)    Hgb urine dipstick MODERATE (*)    Ketones, ur 5 (*)    Protein, ur 100 (*)    RBC / HPF >50 (*)  All other components within normal limits  BASIC METABOLIC PANEL - Abnormal; Notable for the following components:   Glucose, Bld 132 (*)    All other components within normal limits  CBC WITH DIFFERENTIAL/PLATELET    EKG None  Radiology No results found.  Procedures Procedures    Medications Ordered in ED Medications  acetaminophen (TYLENOL) tablet 1,000 mg (1,000 mg Oral Given 07/20/21 1731)  lidocaine (XYLOCAINE) 2 % jelly 1 Application (1 Application Urethral Given 07/20/21 1855)    ED Course/ Medical Decision Making/ A&P                           Medical Decision Making Amount and/or Complexity of Data Reviewed Labs: ordered.   This patient presents to the ED for concern of urethra bleeding and urinary retention, this involves an extensive number of treatment options, and is a complaint that carries with it a high risk of complications and morbidity.  The differential diagnosis includes urethra injury,    Co morbidities that complicate the patient evaluation  Parkinson's disease, GERD, BPH, HLD, CAD, urinary retention, and afib (on Xarelto)   Additional history obtained:  Additional history obtained from epic chart review External records from outside source obtained and reviewed including family   Lab Tests:  I Ordered, and personally interpreted labs.  The pertinent results include:  cbc nl, bmp nl, ua + rbcs   Cardiac Monitoring:  The patient was maintained on a cardiac monitor.  I personally viewed and interpreted the cardiac monitored which showed an underlying rhythm of: nsr   Medicines ordered and prescription drug management:  I ordered medication including tylenol  for pain  Reevaluation of the patient after these medicines showed that the patient improved I have  reviewed the patients home medicines and have made adjustments as needed   Consultations Obtained:  I requested consultation with the urologist (Dr. Alyson Ingles),  and discussed lab and imaging findings as well as pertinent plan -He recommends [facility] and outpatient f/u.     Problem List / ED Course:  Urinary retention:  foley placed.  Urine draining well.  Pt has a little oozing of blood around FC, but it has slowed down significantly.  He is told to hold his next dose of Xarelto.  He is to f/u with Dr. Diona Fanti.  Return if worse.   Reevaluation:  After the interventions noted above, I reevaluated the patient and found that they have :improved   Social Determinants of Health:  Lives at [place]:  After consideration of the diagnostic results and the patients response to treatment, I feel that the patent would benefit from discharge with outpatient f/u.          Final Clinical Impression(s) / ED Diagnoses Final diagnoses:  Dislodged Foley catheter ([place])  Gross hematuria  Injury of urethra, initial encounter    Rx / DC Orders ED Discharge Orders     None         Isla Pence, MD 07/20/21 2025

## 2021-07-20 NOTE — ED Provider Triage Note (Signed)
Emergency Medicine Provider Triage Evaluation Note  Isaiah Pena , a 71 y.o. male  was evaluated in triage.  Pt complains of Foley catheter issue.  Patient reports that 1 hour prior to arrival he was at his rehab facility at [facility] when he says he "felt something on" and looked down to see that his Foley catheter had come out of place.  The patient states that he believes the balloon was deflated when it came out.  The patient is unsure of how it came out.  Patient is on Plavix for A-fib and states that he lost a good amount of blood.  Patient reports that it was probably enough blood to "fill up a small cup from [employer]".  Patient states pain is 6 out of 10.  Review of Systems  Positive:  Negative:   Physical Exam  BP 136/87 (BP Location: Right Arm)   Pulse (!) 103   Temp 98.7 F (37.1 C) (Oral)   Resp 18   Ht '5\' 8"'$  (1.727 m)   Wt 109.3 kg   SpO2 99%   BMI 36.64 kg/m  Gen:   Awake, no distress   Resp:  Normal effort  MSK:   Moves extremities without difficulty  Other:    Medical Decision Making  Medically screening exam initiated at 4:53 PM.  Appropriate orders placed.  Isaiah Pena was informed that the remainder of the evaluation will be completed by another provider, this initial triage assessment does not replace that evaluation, and the importance of remaining in the ED until their evaluation is complete.    Isaiah Cecil, PA-C 07/20/21 1655

## 2021-07-20 NOTE — ED Notes (Signed)
Wet to dry dressing placed around meatus, per verbal order from Dr. Gilford Raid, because slight blood continues to drain.

## 2021-07-20 NOTE — ED Notes (Signed)
[facility] called for transport.  

## 2021-07-22 DIAGNOSIS — G20 Parkinson's disease: Secondary | ICD-10-CM | POA: Diagnosis not present

## 2021-07-22 DIAGNOSIS — G4733 Obstructive sleep apnea (adult) (pediatric): Secondary | ICD-10-CM | POA: Diagnosis not present

## 2021-07-22 DIAGNOSIS — R339 Retention of urine, unspecified: Secondary | ICD-10-CM | POA: Diagnosis not present

## 2021-07-22 DIAGNOSIS — G4700 Insomnia, unspecified: Secondary | ICD-10-CM | POA: Diagnosis not present

## 2021-07-22 DIAGNOSIS — N401 Enlarged prostate with lower urinary tract symptoms: Secondary | ICD-10-CM | POA: Diagnosis not present

## 2021-07-22 DIAGNOSIS — E876 Hypokalemia: Secondary | ICD-10-CM | POA: Diagnosis not present

## 2021-07-22 DIAGNOSIS — N390 Urinary tract infection, site not specified: Secondary | ICD-10-CM | POA: Diagnosis not present

## 2021-07-22 DIAGNOSIS — R2681 Unsteadiness on feet: Secondary | ICD-10-CM | POA: Diagnosis not present

## 2021-07-22 DIAGNOSIS — R319 Hematuria, unspecified: Secondary | ICD-10-CM | POA: Diagnosis not present

## 2021-07-22 DIAGNOSIS — M6259 Muscle wasting and atrophy, not elsewhere classified, multiple sites: Secondary | ICD-10-CM | POA: Diagnosis not present

## 2021-07-22 DIAGNOSIS — Z9181 History of falling: Secondary | ICD-10-CM | POA: Diagnosis not present

## 2021-07-22 DIAGNOSIS — S3730XA Unspecified injury of urethra, initial encounter: Secondary | ICD-10-CM | POA: Diagnosis not present

## 2021-07-22 DIAGNOSIS — I119 Hypertensive heart disease without heart failure: Secondary | ICD-10-CM | POA: Diagnosis not present

## 2021-07-24 ENCOUNTER — Encounter: Payer: Medicare Other | Admitting: Physical Medicine and Rehabilitation

## 2021-07-24 DIAGNOSIS — G4733 Obstructive sleep apnea (adult) (pediatric): Secondary | ICD-10-CM | POA: Diagnosis not present

## 2021-07-24 DIAGNOSIS — N390 Urinary tract infection, site not specified: Secondary | ICD-10-CM | POA: Diagnosis not present

## 2021-07-24 DIAGNOSIS — M6259 Muscle wasting and atrophy, not elsewhere classified, multiple sites: Secondary | ICD-10-CM | POA: Diagnosis not present

## 2021-07-24 DIAGNOSIS — G20 Parkinson's disease: Secondary | ICD-10-CM | POA: Diagnosis not present

## 2021-07-24 DIAGNOSIS — R319 Hematuria, unspecified: Secondary | ICD-10-CM | POA: Diagnosis not present

## 2021-07-24 DIAGNOSIS — R2681 Unsteadiness on feet: Secondary | ICD-10-CM | POA: Diagnosis not present

## 2021-07-24 DIAGNOSIS — Z9181 History of falling: Secondary | ICD-10-CM | POA: Diagnosis not present

## 2021-07-24 DIAGNOSIS — I119 Hypertensive heart disease without heart failure: Secondary | ICD-10-CM | POA: Diagnosis not present

## 2021-07-24 DIAGNOSIS — N401 Enlarged prostate with lower urinary tract symptoms: Secondary | ICD-10-CM | POA: Diagnosis not present

## 2021-07-24 DIAGNOSIS — F419 Anxiety disorder, unspecified: Secondary | ICD-10-CM | POA: Diagnosis not present

## 2021-07-24 DIAGNOSIS — E876 Hypokalemia: Secondary | ICD-10-CM | POA: Diagnosis not present

## 2021-07-24 DIAGNOSIS — F32A Depression, unspecified: Secondary | ICD-10-CM | POA: Diagnosis not present

## 2021-07-24 DIAGNOSIS — G4700 Insomnia, unspecified: Secondary | ICD-10-CM | POA: Diagnosis not present

## 2021-07-25 DIAGNOSIS — G20 Parkinson's disease: Secondary | ICD-10-CM | POA: Diagnosis not present

## 2021-07-25 DIAGNOSIS — N401 Enlarged prostate with lower urinary tract symptoms: Secondary | ICD-10-CM | POA: Diagnosis not present

## 2021-07-25 DIAGNOSIS — G4733 Obstructive sleep apnea (adult) (pediatric): Secondary | ICD-10-CM | POA: Diagnosis not present

## 2021-07-25 DIAGNOSIS — Z9181 History of falling: Secondary | ICD-10-CM | POA: Diagnosis not present

## 2021-07-25 DIAGNOSIS — M6259 Muscle wasting and atrophy, not elsewhere classified, multiple sites: Secondary | ICD-10-CM | POA: Diagnosis not present

## 2021-07-25 DIAGNOSIS — R2681 Unsteadiness on feet: Secondary | ICD-10-CM | POA: Diagnosis not present

## 2021-07-25 DIAGNOSIS — N390 Urinary tract infection, site not specified: Secondary | ICD-10-CM | POA: Diagnosis not present

## 2021-07-25 DIAGNOSIS — I119 Hypertensive heart disease without heart failure: Secondary | ICD-10-CM | POA: Diagnosis not present

## 2021-07-25 DIAGNOSIS — G4700 Insomnia, unspecified: Secondary | ICD-10-CM | POA: Diagnosis not present

## 2021-07-29 DIAGNOSIS — Z9181 History of falling: Secondary | ICD-10-CM | POA: Diagnosis not present

## 2021-07-29 DIAGNOSIS — G20 Parkinson's disease: Secondary | ICD-10-CM | POA: Diagnosis not present

## 2021-07-29 DIAGNOSIS — G4733 Obstructive sleep apnea (adult) (pediatric): Secondary | ICD-10-CM | POA: Diagnosis not present

## 2021-07-29 DIAGNOSIS — R2681 Unsteadiness on feet: Secondary | ICD-10-CM | POA: Diagnosis not present

## 2021-07-29 DIAGNOSIS — G4700 Insomnia, unspecified: Secondary | ICD-10-CM | POA: Diagnosis not present

## 2021-07-29 DIAGNOSIS — M6259 Muscle wasting and atrophy, not elsewhere classified, multiple sites: Secondary | ICD-10-CM | POA: Diagnosis not present

## 2021-07-29 DIAGNOSIS — N390 Urinary tract infection, site not specified: Secondary | ICD-10-CM | POA: Diagnosis not present

## 2021-07-29 DIAGNOSIS — I119 Hypertensive heart disease without heart failure: Secondary | ICD-10-CM | POA: Diagnosis not present

## 2021-07-29 DIAGNOSIS — N401 Enlarged prostate with lower urinary tract symptoms: Secondary | ICD-10-CM | POA: Diagnosis not present

## 2021-07-30 DIAGNOSIS — L988 Other specified disorders of the skin and subcutaneous tissue: Secondary | ICD-10-CM | POA: Diagnosis not present

## 2021-07-31 DIAGNOSIS — G4733 Obstructive sleep apnea (adult) (pediatric): Secondary | ICD-10-CM | POA: Diagnosis not present

## 2021-07-31 DIAGNOSIS — I119 Hypertensive heart disease without heart failure: Secondary | ICD-10-CM | POA: Diagnosis not present

## 2021-07-31 DIAGNOSIS — G20 Parkinson's disease: Secondary | ICD-10-CM | POA: Diagnosis not present

## 2021-07-31 DIAGNOSIS — M6259 Muscle wasting and atrophy, not elsewhere classified, multiple sites: Secondary | ICD-10-CM | POA: Diagnosis not present

## 2021-07-31 DIAGNOSIS — N390 Urinary tract infection, site not specified: Secondary | ICD-10-CM | POA: Diagnosis not present

## 2021-07-31 DIAGNOSIS — N401 Enlarged prostate with lower urinary tract symptoms: Secondary | ICD-10-CM | POA: Diagnosis not present

## 2021-07-31 DIAGNOSIS — R2681 Unsteadiness on feet: Secondary | ICD-10-CM | POA: Diagnosis not present

## 2021-07-31 DIAGNOSIS — Z9181 History of falling: Secondary | ICD-10-CM | POA: Diagnosis not present

## 2021-07-31 DIAGNOSIS — G4700 Insomnia, unspecified: Secondary | ICD-10-CM | POA: Diagnosis not present

## 2021-08-01 ENCOUNTER — Other Ambulatory Visit: Payer: Self-pay | Admitting: *Deleted

## 2021-08-01 NOTE — Patient Outreach (Signed)
[facility] Post- Acute Care Coordinator follow up. Per [facility] Mr. Barley currently resides in [facility]. Screening for care coordination/care management needs as a benefit of Mr. [facility] insurance plan.   Member's PCP at [place] at [facility] has Upstream care management services available.  Facility site visit to [facility] skilled nursing facility. Met with [place], Irine Seal, SNF social workers and [place], [place]. 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 Lived with significant other. Continues to require assistance with therapy. Parkinson's symptoms interfering with progress.  May need LTC vs ALF.   Will continue to follow for transition plans.    Marthenia Rolling, MSN, RN,BSN [place] Acute Care Coordinator [phone] [facility]) [phone]  (Toll free office)

## 2021-08-05 DIAGNOSIS — I2510 Atherosclerotic heart disease of native coronary artery without angina pectoris: Secondary | ICD-10-CM | POA: Diagnosis not present

## 2021-08-05 DIAGNOSIS — I4891 Unspecified atrial fibrillation: Secondary | ICD-10-CM | POA: Diagnosis not present

## 2021-08-05 DIAGNOSIS — M549 Dorsalgia, unspecified: Secondary | ICD-10-CM | POA: Diagnosis not present

## 2021-08-06 DIAGNOSIS — L988 Other specified disorders of the skin and subcutaneous tissue: Secondary | ICD-10-CM | POA: Diagnosis not present

## 2021-08-08 ENCOUNTER — Other Ambulatory Visit: Payer: Self-pay | Admitting: *Deleted

## 2021-08-08 NOTE — Patient Outreach (Signed)
[facility] Post- Acute Care Coordinator follow up. Per [place] eligible member currently resides in [facility].  Screening for potential care coordination/care management services as a benefit of member's insurance plan.  Isaiah Pena PCP at [facility] at [facility] has Upstream care management services.  Facility site visit to [place] skilled nursing facility. Met with Isaiah Pena, Isaiah Pena, SNF social workers and Isaiah Pena, Marketing executive. Transition plan is to remain LTC. Therapy continues to work with [name] to improve function and evaluate for adaptive equipment.    Will continue to follow.    Isaiah Rolling, MSN, RN,BSN Isaiah Pena Acute Care Coordinator [phone] [facility]) [phone]  (Toll free office)

## 2021-08-12 DIAGNOSIS — I119 Hypertensive heart disease without heart failure: Secondary | ICD-10-CM | POA: Diagnosis not present

## 2021-08-12 DIAGNOSIS — N390 Urinary tract infection, site not specified: Secondary | ICD-10-CM | POA: Diagnosis not present

## 2021-08-12 DIAGNOSIS — Z9181 History of falling: Secondary | ICD-10-CM | POA: Diagnosis not present

## 2021-08-12 DIAGNOSIS — N401 Enlarged prostate with lower urinary tract symptoms: Secondary | ICD-10-CM | POA: Diagnosis not present

## 2021-08-12 DIAGNOSIS — G4700 Insomnia, unspecified: Secondary | ICD-10-CM | POA: Diagnosis not present

## 2021-08-12 DIAGNOSIS — M6259 Muscle wasting and atrophy, not elsewhere classified, multiple sites: Secondary | ICD-10-CM | POA: Diagnosis not present

## 2021-08-12 DIAGNOSIS — G20 Parkinson's disease: Secondary | ICD-10-CM | POA: Diagnosis not present

## 2021-08-12 DIAGNOSIS — G4733 Obstructive sleep apnea (adult) (pediatric): Secondary | ICD-10-CM | POA: Diagnosis not present

## 2021-08-12 DIAGNOSIS — R2681 Unsteadiness on feet: Secondary | ICD-10-CM | POA: Diagnosis not present

## 2021-08-13 DIAGNOSIS — L988 Other specified disorders of the skin and subcutaneous tissue: Secondary | ICD-10-CM | POA: Diagnosis not present

## 2021-08-14 DIAGNOSIS — N401 Enlarged prostate with lower urinary tract symptoms: Secondary | ICD-10-CM | POA: Diagnosis not present

## 2021-08-14 DIAGNOSIS — G4733 Obstructive sleep apnea (adult) (pediatric): Secondary | ICD-10-CM | POA: Diagnosis not present

## 2021-08-14 DIAGNOSIS — Z9181 History of falling: Secondary | ICD-10-CM | POA: Diagnosis not present

## 2021-08-14 DIAGNOSIS — R2681 Unsteadiness on feet: Secondary | ICD-10-CM | POA: Diagnosis not present

## 2021-08-14 DIAGNOSIS — I119 Hypertensive heart disease without heart failure: Secondary | ICD-10-CM | POA: Diagnosis not present

## 2021-08-14 DIAGNOSIS — M6259 Muscle wasting and atrophy, not elsewhere classified, multiple sites: Secondary | ICD-10-CM | POA: Diagnosis not present

## 2021-08-14 DIAGNOSIS — N390 Urinary tract infection, site not specified: Secondary | ICD-10-CM | POA: Diagnosis not present

## 2021-08-14 DIAGNOSIS — G4700 Insomnia, unspecified: Secondary | ICD-10-CM | POA: Diagnosis not present

## 2021-08-14 DIAGNOSIS — G20 Parkinson's disease: Secondary | ICD-10-CM | POA: Diagnosis not present

## 2021-08-15 ENCOUNTER — Other Ambulatory Visit: Payer: Self-pay | Admitting: *Deleted

## 2021-08-15 DIAGNOSIS — G4700 Insomnia, unspecified: Secondary | ICD-10-CM | POA: Diagnosis not present

## 2021-08-15 DIAGNOSIS — R2681 Unsteadiness on feet: Secondary | ICD-10-CM | POA: Diagnosis not present

## 2021-08-15 DIAGNOSIS — N401 Enlarged prostate with lower urinary tract symptoms: Secondary | ICD-10-CM | POA: Diagnosis not present

## 2021-08-15 DIAGNOSIS — N390 Urinary tract infection, site not specified: Secondary | ICD-10-CM | POA: Diagnosis not present

## 2021-08-15 DIAGNOSIS — I119 Hypertensive heart disease without heart failure: Secondary | ICD-10-CM | POA: Diagnosis not present

## 2021-08-15 DIAGNOSIS — Z9181 History of falling: Secondary | ICD-10-CM | POA: Diagnosis not present

## 2021-08-15 DIAGNOSIS — G4733 Obstructive sleep apnea (adult) (pediatric): Secondary | ICD-10-CM | POA: Diagnosis not present

## 2021-08-15 DIAGNOSIS — M6259 Muscle wasting and atrophy, not elsewhere classified, multiple sites: Secondary | ICD-10-CM | POA: Diagnosis not present

## 2021-08-15 DIAGNOSIS — G20 Parkinson's disease: Secondary | ICD-10-CM | POA: Diagnosis not present

## 2021-08-15 NOTE — Patient Outreach (Signed)
[facility] Post- Acute Care Coordinator follow up. Per [place] eligible member currently resides in [facility].  Screening for potential care coordination/care management needs.   Mr. Guyette PCP with [facility] at [facility] has Upstream care management services.   Facility site visit to [facility] skilled nursing facility. Met with Irine Seal and Kirstin, SNF social workers. Irine Seal reports Mr. Las Ochenta is making gains with therapy. He was previously standing frame. Ambulating with walker 40 feet. Transfers with walker are SBA. Goal is to become as independent as possible. Transition plan remains LTC.   Will continue to follow while Mr. Bondy remains in SNF.    Marthenia Rolling, MSN, RN,BSN [place] Acute Care Coordinator [phone] [facility]) [phone]  (Toll free office)

## 2021-08-20 DIAGNOSIS — L988 Other specified disorders of the skin and subcutaneous tissue: Secondary | ICD-10-CM | POA: Diagnosis not present

## 2021-08-23 DIAGNOSIS — T839XXA Unspecified complication of genitourinary prosthetic device, implant and graft, initial encounter: Secondary | ICD-10-CM | POA: Diagnosis not present

## 2021-08-23 DIAGNOSIS — N390 Urinary tract infection, site not specified: Secondary | ICD-10-CM | POA: Diagnosis not present

## 2021-08-23 DIAGNOSIS — H5789 Other specified disorders of eye and adnexa: Secondary | ICD-10-CM | POA: Diagnosis not present

## 2021-08-23 DIAGNOSIS — G20 Parkinson's disease: Secondary | ICD-10-CM | POA: Diagnosis not present

## 2021-08-26 DIAGNOSIS — R2681 Unsteadiness on feet: Secondary | ICD-10-CM | POA: Diagnosis not present

## 2021-08-26 DIAGNOSIS — N390 Urinary tract infection, site not specified: Secondary | ICD-10-CM | POA: Diagnosis not present

## 2021-08-26 DIAGNOSIS — G20 Parkinson's disease: Secondary | ICD-10-CM | POA: Diagnosis not present

## 2021-08-26 DIAGNOSIS — M6259 Muscle wasting and atrophy, not elsewhere classified, multiple sites: Secondary | ICD-10-CM | POA: Diagnosis not present

## 2021-08-26 DIAGNOSIS — G4733 Obstructive sleep apnea (adult) (pediatric): Secondary | ICD-10-CM | POA: Diagnosis not present

## 2021-08-26 DIAGNOSIS — Z9181 History of falling: Secondary | ICD-10-CM | POA: Diagnosis not present

## 2021-08-26 DIAGNOSIS — I119 Hypertensive heart disease without heart failure: Secondary | ICD-10-CM | POA: Diagnosis not present

## 2021-08-26 DIAGNOSIS — G4700 Insomnia, unspecified: Secondary | ICD-10-CM | POA: Diagnosis not present

## 2021-08-26 DIAGNOSIS — N401 Enlarged prostate with lower urinary tract symptoms: Secondary | ICD-10-CM | POA: Diagnosis not present

## 2021-08-27 DIAGNOSIS — Z9181 History of falling: Secondary | ICD-10-CM | POA: Diagnosis not present

## 2021-08-27 DIAGNOSIS — R2681 Unsteadiness on feet: Secondary | ICD-10-CM | POA: Diagnosis not present

## 2021-08-27 DIAGNOSIS — G4700 Insomnia, unspecified: Secondary | ICD-10-CM | POA: Diagnosis not present

## 2021-08-27 DIAGNOSIS — M6259 Muscle wasting and atrophy, not elsewhere classified, multiple sites: Secondary | ICD-10-CM | POA: Diagnosis not present

## 2021-08-27 DIAGNOSIS — I119 Hypertensive heart disease without heart failure: Secondary | ICD-10-CM | POA: Diagnosis not present

## 2021-08-27 DIAGNOSIS — G20 Parkinson's disease: Secondary | ICD-10-CM | POA: Diagnosis not present

## 2021-08-27 DIAGNOSIS — N390 Urinary tract infection, site not specified: Secondary | ICD-10-CM | POA: Diagnosis not present

## 2021-08-27 DIAGNOSIS — G4733 Obstructive sleep apnea (adult) (pediatric): Secondary | ICD-10-CM | POA: Diagnosis not present

## 2021-08-27 DIAGNOSIS — N401 Enlarged prostate with lower urinary tract symptoms: Secondary | ICD-10-CM | POA: Diagnosis not present

## 2021-08-29 ENCOUNTER — Other Ambulatory Visit: Payer: Self-pay | Admitting: *Deleted

## 2021-08-29 DIAGNOSIS — I4891 Unspecified atrial fibrillation: Secondary | ICD-10-CM | POA: Diagnosis not present

## 2021-08-29 DIAGNOSIS — N401 Enlarged prostate with lower urinary tract symptoms: Secondary | ICD-10-CM | POA: Diagnosis not present

## 2021-08-29 DIAGNOSIS — R339 Retention of urine, unspecified: Secondary | ICD-10-CM | POA: Diagnosis not present

## 2021-08-29 DIAGNOSIS — R2681 Unsteadiness on feet: Secondary | ICD-10-CM | POA: Diagnosis not present

## 2021-08-29 DIAGNOSIS — G20 Parkinson's disease: Secondary | ICD-10-CM | POA: Diagnosis not present

## 2021-08-29 DIAGNOSIS — R5381 Other malaise: Secondary | ICD-10-CM | POA: Diagnosis not present

## 2021-08-29 DIAGNOSIS — F419 Anxiety disorder, unspecified: Secondary | ICD-10-CM | POA: Diagnosis not present

## 2021-08-29 DIAGNOSIS — N390 Urinary tract infection, site not specified: Secondary | ICD-10-CM | POA: Diagnosis not present

## 2021-08-29 DIAGNOSIS — Z9181 History of falling: Secondary | ICD-10-CM | POA: Diagnosis not present

## 2021-08-29 DIAGNOSIS — G4733 Obstructive sleep apnea (adult) (pediatric): Secondary | ICD-10-CM | POA: Diagnosis not present

## 2021-08-29 DIAGNOSIS — M6259 Muscle wasting and atrophy, not elsewhere classified, multiple sites: Secondary | ICD-10-CM | POA: Diagnosis not present

## 2021-08-29 DIAGNOSIS — G4700 Insomnia, unspecified: Secondary | ICD-10-CM | POA: Diagnosis not present

## 2021-08-29 DIAGNOSIS — Z960 Presence of urogenital implants: Secondary | ICD-10-CM | POA: Diagnosis not present

## 2021-08-29 DIAGNOSIS — I119 Hypertensive heart disease without heart failure: Secondary | ICD-10-CM | POA: Diagnosis not present

## 2021-08-29 NOTE — Patient Outreach (Signed)
[place] Coordinator follow up. Mr. Tarte remains in [facility].  Facility site visit to [facility] [place]. Collaboration with Amaryllis Dyke, SNF social workers and Verdis Frederickson, Civil engineer, contracting. Palliative care meeting scheduled for today. Discussed transition to LTC.  Will continue to follow.   Marthenia Rolling, MSN, RN,BSN [place] Acute Care Coordinator [phone] [facility]) [phone]  (Toll free office)

## 2021-09-02 ENCOUNTER — Emergency Department (HOSPITAL_COMMUNITY): Payer: Medicare Other

## 2021-09-02 ENCOUNTER — Emergency Department (HOSPITAL_COMMUNITY)
Admission: EM | Admit: 2021-09-02 | Discharge: 2021-09-03 | Disposition: A | Discharge status: Home / Self Care | Payer: Medicare Other | Attending: Emergency Medicine | Admitting: Emergency Medicine

## 2021-09-02 DIAGNOSIS — W050XXA Fall from non-moving wheelchair, initial encounter: Secondary | ICD-10-CM | POA: Insufficient documentation

## 2021-09-02 DIAGNOSIS — I10 Essential (primary) hypertension: Secondary | ICD-10-CM | POA: Diagnosis not present

## 2021-09-02 DIAGNOSIS — N390 Urinary tract infection, site not specified: Secondary | ICD-10-CM | POA: Diagnosis not present

## 2021-09-02 DIAGNOSIS — R310 Gross hematuria: Secondary | ICD-10-CM | POA: Diagnosis not present

## 2021-09-02 DIAGNOSIS — R2681 Unsteadiness on feet: Secondary | ICD-10-CM | POA: Diagnosis not present

## 2021-09-02 DIAGNOSIS — F5101 Primary insomnia: Secondary | ICD-10-CM | POA: Diagnosis not present

## 2021-09-02 DIAGNOSIS — I119 Hypertensive heart disease without heart failure: Secondary | ICD-10-CM | POA: Diagnosis not present

## 2021-09-02 DIAGNOSIS — Z7901 Long term (current) use of anticoagulants: Secondary | ICD-10-CM | POA: Insufficient documentation

## 2021-09-02 DIAGNOSIS — G4700 Insomnia, unspecified: Secondary | ICD-10-CM | POA: Diagnosis not present

## 2021-09-02 DIAGNOSIS — N281 Cyst of kidney, acquired: Secondary | ICD-10-CM | POA: Diagnosis not present

## 2021-09-02 DIAGNOSIS — F32A Depression, unspecified: Secondary | ICD-10-CM | POA: Diagnosis not present

## 2021-09-02 DIAGNOSIS — K5730 Diverticulosis of large intestine without perforation or abscess without bleeding: Secondary | ICD-10-CM | POA: Diagnosis not present

## 2021-09-02 DIAGNOSIS — G4733 Obstructive sleep apnea (adult) (pediatric): Secondary | ICD-10-CM | POA: Diagnosis not present

## 2021-09-02 DIAGNOSIS — Z79899 Other long term (current) drug therapy: Secondary | ICD-10-CM | POA: Diagnosis not present

## 2021-09-02 DIAGNOSIS — G20 Parkinson's disease: Secondary | ICD-10-CM | POA: Diagnosis not present

## 2021-09-02 DIAGNOSIS — K8020 Calculus of gallbladder without cholecystitis without obstruction: Secondary | ICD-10-CM | POA: Diagnosis not present

## 2021-09-02 DIAGNOSIS — E049 Nontoxic goiter, unspecified: Secondary | ICD-10-CM | POA: Diagnosis not present

## 2021-09-02 DIAGNOSIS — W19XXXA Unspecified fall, initial encounter: Secondary | ICD-10-CM

## 2021-09-02 DIAGNOSIS — R791 Abnormal coagulation profile: Secondary | ICD-10-CM | POA: Diagnosis not present

## 2021-09-02 DIAGNOSIS — S0990XA Unspecified injury of head, initial encounter: Secondary | ICD-10-CM | POA: Diagnosis not present

## 2021-09-02 DIAGNOSIS — M6259 Muscle wasting and atrophy, not elsewhere classified, multiple sites: Secondary | ICD-10-CM | POA: Diagnosis not present

## 2021-09-02 DIAGNOSIS — I517 Cardiomegaly: Secondary | ICD-10-CM | POA: Diagnosis not present

## 2021-09-02 DIAGNOSIS — I4891 Unspecified atrial fibrillation: Secondary | ICD-10-CM | POA: Diagnosis not present

## 2021-09-02 DIAGNOSIS — R590 Localized enlarged lymph nodes: Secondary | ICD-10-CM | POA: Insufficient documentation

## 2021-09-02 DIAGNOSIS — E079 Disorder of thyroid, unspecified: Secondary | ICD-10-CM

## 2021-09-02 DIAGNOSIS — F418 Other specified anxiety disorders: Secondary | ICD-10-CM | POA: Diagnosis not present

## 2021-09-02 DIAGNOSIS — K760 Fatty (change of) liver, not elsewhere classified: Secondary | ICD-10-CM | POA: Diagnosis not present

## 2021-09-02 DIAGNOSIS — Z9181 History of falling: Secondary | ICD-10-CM | POA: Diagnosis not present

## 2021-09-02 DIAGNOSIS — S299XXA Unspecified injury of thorax, initial encounter: Secondary | ICD-10-CM | POA: Diagnosis not present

## 2021-09-02 DIAGNOSIS — Z043 Encounter for examination and observation following other accident: Secondary | ICD-10-CM | POA: Diagnosis not present

## 2021-09-02 DIAGNOSIS — M4802 Spinal stenosis, cervical region: Secondary | ICD-10-CM | POA: Diagnosis not present

## 2021-09-02 DIAGNOSIS — N401 Enlarged prostate with lower urinary tract symptoms: Secondary | ICD-10-CM | POA: Diagnosis not present

## 2021-09-02 LAB — COMPREHENSIVE METABOLIC PANEL
ALT: 7 U/L (ref 0–44)
AST: 17 U/L (ref 15–41)
Albumin: 3.4 g/dL — ABNORMAL LOW (ref 3.5–5.0)
Alkaline Phosphatase: 94 U/L (ref 38–126)
Anion gap: 9 (ref 5–15)
BUN: 23 mg/dL (ref 8–23)
CO2: 23 mmol/L (ref 22–32)
Calcium: 8.9 mg/dL (ref 8.9–10.3)
Chloride: 111 mmol/L (ref 98–111)
Creatinine, Ser: 1.04 mg/dL (ref 0.61–1.24)
GFR, Estimated: >60 mL/min (ref >60)
Glucose, Bld: 126 mg/dL — ABNORMAL HIGH (ref 70–99)
Potassium: 3.4 mmol/L — ABNORMAL LOW (ref 3.5–5.1)
Sodium: 143 mmol/L (ref 135–145)
Total Bilirubin: 0.6 mg/dL (ref 0.3–1.2)
Total Protein: 5.7 g/dL — ABNORMAL LOW (ref 6.5–8.1)

## 2021-09-02 LAB — TROPONIN I (HIGH SENSITIVITY): Troponin I (High Sensitivity): 4 ng/L (ref <18)

## 2021-09-02 LAB — URINALYSIS, ROUTINE W REFLEX MICROSCOPIC
Bilirubin Urine: NEGATIVE
Glucose, UA: 100 mg/dL — AB
Ketones, ur: NEGATIVE mg/dL
Leukocytes,Ua: NEGATIVE
Nitrite: NEGATIVE
Protein, ur: >300 mg/dL — AB
Specific Gravity, Urine: 1.025 (ref 1.005–1.030)
pH: 6 (ref 5.0–8.0)

## 2021-09-02 LAB — CBC WITH DIFFERENTIAL/PLATELET
Abs Immature Granulocytes: 0.03 10*3/uL (ref 0.00–0.07)
Basophils Absolute: 0.1 10*3/uL (ref 0.0–0.1)
Basophils Relative: 1 %
Eosinophils Absolute: 0.2 10*3/uL (ref 0.0–0.5)
Eosinophils Relative: 2 %
HCT: 42.7 % (ref 39.0–52.0)
Hemoglobin: 14.2 g/dL (ref 13.0–17.0)
Immature Granulocytes: 0 %
Lymphocytes Relative: 12 %
Lymphs Abs: 1.2 10*3/uL (ref 0.7–4.0)
MCH: 33.4 pg (ref 26.0–34.0)
MCHC: 33.3 g/dL (ref 30.0–36.0)
MCV: 100.5 fL — ABNORMAL HIGH (ref 80.0–100.0)
Monocytes Absolute: 0.7 10*3/uL (ref 0.1–1.0)
Monocytes Relative: 7 %
Neutro Abs: 7.7 10*3/uL (ref 1.7–7.7)
Neutrophils Relative %: 78 %
Platelets: 162 10*3/uL (ref 150–400)
RBC: 4.25 MIL/uL (ref 4.22–5.81)
RDW: 13.2 % (ref 11.5–15.5)
WBC: 9.8 10*3/uL (ref 4.0–10.5)
nRBC: 0 % (ref 0.0–0.2)

## 2021-09-02 LAB — URINALYSIS, MICROSCOPIC (REFLEX)
Bacteria, UA: NONE SEEN
RBC / HPF: >50 RBC/hpf (ref 0–5)

## 2021-09-02 LAB — PROTIME-INR
INR: 3.2 — ABNORMAL HIGH (ref 0.8–1.2)
Prothrombin Time: 32.4 seconds — ABNORMAL HIGH (ref 11.4–15.2)

## 2021-09-02 LAB — LACTIC ACID, PLASMA: Lactic Acid, Venous: 3 mmol/L (ref 0.5–1.9)

## 2021-09-02 LAB — SAMPLE TO BLOOD BANK

## 2021-09-02 LAB — APTT: aPTT: 42 seconds — ABNORMAL HIGH (ref 24–36)

## 2021-09-02 MED ORDER — SODIUM CHLORIDE 0.9 % IV BOLUS
1000.0000 mL | Freq: Once | INTRAVENOUS | Status: AC
  Administered 2021-09-02: 1000 mL via INTRAVENOUS

## 2021-09-02 MED ORDER — IOHEXOL 300 MG/ML  SOLN
100.0000 mL | Freq: Once | INTRAMUSCULAR | Status: AC | PRN
  Administered 2021-09-02: 100 mL via INTRAVENOUS

## 2021-09-02 MED ORDER — MORPHINE SULFATE (PF) 4 MG/ML IV SOLN
4.0000 mg | Freq: Once | INTRAVENOUS | Status: AC
  Administered 2021-09-02: 4 mg via INTRAVENOUS
  Filled 2021-09-02: qty 1

## 2021-09-02 NOTE — ED Notes (Signed)
Pt c/o of abdominal pain. [place] DO made aware

## 2021-09-02 NOTE — ED Notes (Signed)
Patient transported to CT 

## 2021-09-02 NOTE — Discharge Instructions (Addendum)
You were seen in the emergency department after your fall. You CT scans and X-rays showed no knew injuries. You did have blood in your urine but there were no signs of injury from your kidneys causing the bleeding. It is possible that your foley catheter could have been pulled during the fall causing some bleeding. Your lab work showed that your numbers to evaluate how well your blood is clotting (PT/PTT) were elevated. It is unclear why these are elevated but you should have these labs rechecked by your primary doctor. Your CT of your neck also incidentally showed that your thyroid gland is a little enlarged and this should be followed up by your primary doctor. You should return to the emergency department if you have worsening pain, you pass out, you have headaches, you are confused, you have repetitive vomiting or if you have any other new or concerning symptoms.

## 2021-09-02 NOTE — ED Notes (Signed)
Blain DO updated regarding pts critical lactic 3.0

## 2021-09-02 NOTE — ED Provider Notes (Signed)
[facility] EMERGENCY DEPARTMENT Provider Note   CSN: [id number] Arrival date & time: 09/02/21  2119     History {Add pertinent medical, surgical, social history, OB history to HPI:1} Chief Complaint  Patient presents with   Isaiah Pena is a 71 y.o. male.  Patient is a 71 year old male with a past medical history of A-fib on Xarelto, OSA, hypertension, Parkinson's, urinary retention with Foley catheter in place presenting to the emergency department after a fall.  The patient states that he normally gets around by wheelchair but he was recently diagnosed with a UTI and he states that it has been causing him to feel anxious and want to wander.  He states that he tried to get up and walk and got lightheaded and fell.  He states that he did hit his head.  He denies loss of consciousness.  Nursing home staff was able to help get him up and then when they noticed that he had blood from his Foley catheter they recommended he come to the emergency department.  He states that it did feel like his Foley catheter pulled when he fell.  He denies any flank pain or abdominal pain.  He denies any headaches, nausea, vomiting, numbness or weakness.  He denied any chest pain or shortness of breath prior to the fall.  He states prior to the fall his urine was draining clear.   Fall       Home Medications Prior to Admission medications   Medication Sig Start Date End Date Taking? Authorizing Provider  acetaminophen (TYLENOL) 650 MG CR tablet Take 2 tablets (1,300 mg total) by mouth every 8 (eight) hours as needed for pain. 06/05/21   Setzer, Edman Circle, PA-C  amantadine (SYMMETREL) 100 MG capsule Take 100 mg by mouth See admin instructions. Take one capsule (100 mg) by mouth daily at bedtime - 1am 03/14/21   [provider]  amLODipine (NORVASC) 10 MG tablet Take 1 tablet (10 mg total) by mouth daily. 06/17/21 06/17/22  Mariel Aloe, MD  atorvastatin (LIPITOR) 10 MG  tablet TAKE 1/2 TABLET BY MOUTH EVERY OTHER DAY AND ALTERNATING 1 TABLET BY MOUTH EVERY OTHER DAY. Please keep upcoming appt in April 2023 before anymore refills. Thank you Patient taking differently: Take 5-10 mg by mouth See admin instructions. Take 1/2 tablet (5 mg) by mouth every other evening, alternate with 1 tablet (10 mg) every other evening. Please keep upcoming appt in April 2023 before anymore refills. Thank you 02/05/21   Sueanne Margarita, MD  carbidopa-levodopa (SINEMET IR) 25-100 MG tablet Take 3 tablets by mouth every 8 (eight) hours. 8am, 5pm and 1am 05/03/16   [provider]  clonazePAM (KLONOPIN) 0.5 MG tablet Take 0.5 tablets (0.25 mg total) by mouth 2 (two) times daily as needed for anxiety. 06/17/21   Mariel Aloe, MD  entacapone (COMTAN) 200 MG tablet Take 200 mg by mouth every 8 (eight) hours. 9am, 5pm, 1am 09/16/14   [provider]  esomeprazole (NEXIUM) 20 MG capsule Take 20 mg by mouth every other day. OTC    [provider]  ezetimibe (ZETIA) 10 MG tablet Take 10 mg by mouth daily. 06/02/21   [provider]  feeding supplement (ENSURE ENLIVE / ENSURE PLUS) LIQD Take 237 mLs by mouth 2 (two) times daily between meals. 06/17/21   Mariel Aloe, MD  finasteride (PROSCAR) 5 MG tablet TAKE 1 TABLET(5 MG) BY MOUTH DAILY Patient taking  differently: Take 5 mg by mouth daily. 06/05/21   Setzer, Edman Circle, PA-C  magnesium gluconate (MAGONATE) 500 MG tablet Take 1 tablet (500 mg total) by mouth 2 (two) times daily. 06/05/21   Setzer, Edman Circle, PA-C  metoprolol (LOPRESSOR) 50 MG tablet Take 50 mg by mouth 2 (two) times daily.    [provider]  Multiple Vitamin (MULTIVITAMIN WITH MINERALS) TABS tablet Take 1 tablet by mouth daily. Patient not taking: Reported on 07/05/2021 06/18/21   Mariel Aloe, MD  polyethylene glycol (MIRALAX / GLYCOLAX) 17 g packet Take 17 g by mouth daily as needed. 06/17/21   Mariel Aloe, MD  potassium chloride SA  (KLOR-CON M) 20 MEQ tablet Take 1 tablet (20 mEq total) by mouth every other day. 06/05/21   Setzer, Edman Circle, PA-C  rivaroxaban (XARELTO) 20 MG TABS tablet Take 1 tablet (20 mg total) by mouth daily with supper. Please keep upcoming telemedicine appointment in June. Thank you. 06/06/21   Sueanne Margarita, MD  tadalafil (CIALIS) 5 MG tablet Take 5 mg by mouth every evening.    [provider]  traMADol (ULTRAM) 50 MG tablet Take 1 tablet (50 mg total) by mouth 3 (three) times daily as needed (pain). 06/17/21   Mariel Aloe, MD      Allergies    Sulfa antibiotics    Review of Systems   Review of Systems  Physical Exam Updated Vital Signs BP 115/85 (BP Location: Left Arm)   Pulse (!) 103   Temp 98.1 F (36.7 C) (Oral)   Resp 17   SpO2 95%  Physical Exam  ED Results / Procedures / Treatments   Labs (all labs ordered are listed, but only abnormal results are displayed) Labs Reviewed  PROTIME-INR - Abnormal; Notable for the following components:      Result Value   Prothrombin Time 32.4 (*)    INR 3.2 (*)    All other components within normal limits  APTT - Abnormal; Notable for the following components:   aPTT 42 (*)    All other components within normal limits  CBC WITH DIFFERENTIAL/PLATELET - Abnormal; Notable for the following components:   MCV 100.5 (*)    All other components within normal limits  COMPREHENSIVE METABOLIC PANEL  URINALYSIS, ROUTINE W REFLEX MICROSCOPIC  LACTIC ACID, PLASMA  SAMPLE TO BLOOD BANK  TROPONIN I (HIGH SENSITIVITY)    EKG EKG Interpretation  Date/Time:  Monday September 02 2021 21:37:58 EDT Ventricular Rate:  92 PR Interval:    QRS Duration: 107 QT Interval:  366 QTC Calculation: 453 R Axis:   19 Text Interpretation: Atrial fibrillation Borderline T abnormalities, inferior leads No significant change since last tracing Confirmed by Oneal Deputy [id number]) on 09/02/2021 10:11:17 PM  Radiology DG Chest Port 1 View  Result Date:  09/02/2021 CLINICAL DATA:  Trauma on blood thinners EXAM: PORTABLE CHEST 1 VIEW COMPARISON:  Radiographs 07/11/2021 FINDINGS: Unchanged mild cardiomegaly. Normal pulmonary vascularity. No focal consolidation, pleural effusion, or pneumothorax. No displaced rib fractures. IMPRESSION: No acute abnormality. Electronically Signed   By: Placido Sou M.D.   On: 09/02/2021 21:41    Procedures Procedures  {Document cardiac monitor, telemetry assessment procedure when appropriate:1}  Medications Ordered in ED Medications - No data to display  ED Course/ Medical Decision Making/ A&P Clinical Course as of 09/02/21 2212  Mon Sep 02, 2021  2209 Labs reviewed and interpreted by myself show increased coagulopathy with an INR of 3.2 as well as  a prolonged PT and PTT.  The patient is on Xarelto which should not prolong his coagulation.  This may be contributing to his hematuria.  Labs are pending at this time to evaluate for liver dysfunction.  Initial hemoglobin is stable at 14.2. [VK]  2210 Chest x-ray shows no acute disease.  CT scans are pending read at this time. [VK]    Clinical Course User Index [VK] Ottie Glazier, DO                           Medical Decision Making Amount and/or Complexity of Data Reviewed Labs: ordered. Radiology: ordered.   ***  {Document critical care time when appropriate:1} {Document review of labs and clinical decision tools ie heart score, Chads2Vasc2 etc:1}  {Document your independent review of radiology images, and any outside records:1} {Document your discussion with family members, caretakers, and with consultants:1} {Document social determinants of health affecting pt's care:1} {Document your decision making why or why not admission, treatments were needed:1} Final Clinical Impression(s) / ED Diagnoses Final diagnoses:  None    Rx / DC Orders ED Discharge Orders     None

## 2021-09-02 NOTE — ED Triage Notes (Signed)
Pt bib [facility] from [facility] as Level 2 fall on thinners around 1920. On Xarelto, wheelchair bound, tried to walk and fell, hit back of head, small abrasion noted. No LOC reported. Facility noted blood in foley bag. Axo x4. 22G R hand  BP 100/80 HR 106 SPO2 94% RA  CBG 126

## 2021-09-03 DIAGNOSIS — Z87440 Personal history of urinary (tract) infections: Secondary | ICD-10-CM | POA: Diagnosis not present

## 2021-09-03 DIAGNOSIS — Z7189 Other specified counseling: Secondary | ICD-10-CM | POA: Diagnosis not present

## 2021-09-03 DIAGNOSIS — Z9181 History of falling: Secondary | ICD-10-CM | POA: Diagnosis not present

## 2021-09-03 DIAGNOSIS — R319 Hematuria, unspecified: Secondary | ICD-10-CM | POA: Diagnosis not present

## 2021-09-03 LAB — LACTIC ACID, PLASMA: Lactic Acid, Venous: 1.3 mmol/L (ref 0.5–1.9)

## 2021-09-03 NOTE — ED Notes (Signed)
RN reviewed discharge instructions with PTAR transport. VSS upon discharge

## 2021-09-03 NOTE — ED Notes (Signed)
Foley balloon deflated and catheter advanced, balloon reinflated. Active bleeding noted around urethra. This RN held pressure with guaze until bleeding slowed. Guaze wrapped around urethra.

## 2021-09-03 NOTE — ED Notes (Signed)
Pt placed on bedpan with assistance from this and another RN

## 2021-09-03 NOTE — ED Notes (Signed)
Pt repositioned in bed and given water

## 2021-09-03 NOTE — ED Notes (Signed)
[facility] called for pt to be transported back to [facility], nest on list. Pts son made aware and is going home

## 2021-09-05 ENCOUNTER — Other Ambulatory Visit: Payer: Self-pay | Admitting: *Deleted

## 2021-09-05 NOTE — Patient Outreach (Signed)
[place] Coordinator follow up. Mr. Radler recently returned back to [facility] from ED.   Met with therapy manager Verdis Frederickson and Amaryllis Dyke, SNF social workers. Verdis Frederickson states there is some physical decline since returning from hospital. Has unsafe retro lean. Referral has been placed to [facility].  Will continue to follow.   Marthenia Rolling, MSN, RN,BSN [place] Acute Care Coordinator [phone] [facility]) [phone]  (Toll free office)

## 2021-09-06 ENCOUNTER — Telehealth: Payer: Self-pay | Admitting: *Deleted

## 2021-09-06 DIAGNOSIS — Z7189 Other specified counseling: Secondary | ICD-10-CM | POA: Diagnosis not present

## 2021-09-06 DIAGNOSIS — Z9181 History of falling: Secondary | ICD-10-CM | POA: Diagnosis not present

## 2021-09-06 DIAGNOSIS — B957 Other staphylococcus as the cause of diseases classified elsewhere: Secondary | ICD-10-CM | POA: Diagnosis not present

## 2021-09-06 DIAGNOSIS — N390 Urinary tract infection, site not specified: Secondary | ICD-10-CM | POA: Diagnosis not present

## 2021-09-06 DIAGNOSIS — D72829 Elevated white blood cell count, unspecified: Secondary | ICD-10-CM | POA: Diagnosis not present

## 2021-09-06 DIAGNOSIS — R319 Hematuria, unspecified: Secondary | ICD-10-CM | POA: Diagnosis not present

## 2021-09-06 DIAGNOSIS — R109 Unspecified abdominal pain: Secondary | ICD-10-CM | POA: Diagnosis not present

## 2021-09-06 NOTE — Telephone Encounter (Signed)
     Patient  visit on 09/03/2021  at [facility]  was for fall  Have you been able to follow up with your primary care physician? patient placed in hospice after recent fall  The patient was able to obtain any needed medicine or equipment.  Are there diet recommendations that you are having difficulty following?na  Patient expresses understanding of discharge instructions and education provided has no other needs at this time.    [place] [phone] [address] , [place] [address] Email : [email or web address]

## 2021-09-09 DIAGNOSIS — Z789 Other specified health status: Secondary | ICD-10-CM | POA: Diagnosis not present

## 2021-09-09 DIAGNOSIS — N481 Balanitis: Secondary | ICD-10-CM | POA: Diagnosis not present

## 2021-09-09 DIAGNOSIS — Z978 Presence of other specified devices: Secondary | ICD-10-CM | POA: Diagnosis not present

## 2021-09-11 DIAGNOSIS — G20 Parkinson's disease: Secondary | ICD-10-CM | POA: Diagnosis not present

## 2021-09-11 DIAGNOSIS — N390 Urinary tract infection, site not specified: Secondary | ICD-10-CM | POA: Diagnosis not present

## 2021-09-11 DIAGNOSIS — G4733 Obstructive sleep apnea (adult) (pediatric): Secondary | ICD-10-CM | POA: Diagnosis not present

## 2021-09-11 DIAGNOSIS — I119 Hypertensive heart disease without heart failure: Secondary | ICD-10-CM | POA: Diagnosis not present

## 2021-09-11 DIAGNOSIS — G4700 Insomnia, unspecified: Secondary | ICD-10-CM | POA: Diagnosis not present

## 2021-09-11 DIAGNOSIS — N401 Enlarged prostate with lower urinary tract symptoms: Secondary | ICD-10-CM | POA: Diagnosis not present

## 2021-09-11 DIAGNOSIS — R2681 Unsteadiness on feet: Secondary | ICD-10-CM | POA: Diagnosis not present

## 2021-09-11 DIAGNOSIS — M6259 Muscle wasting and atrophy, not elsewhere classified, multiple sites: Secondary | ICD-10-CM | POA: Diagnosis not present

## 2021-09-11 DIAGNOSIS — Z9181 History of falling: Secondary | ICD-10-CM | POA: Diagnosis not present

## 2021-09-12 ENCOUNTER — Other Ambulatory Visit: Payer: Self-pay | Admitting: *Deleted

## 2021-09-12 DIAGNOSIS — I4891 Unspecified atrial fibrillation: Secondary | ICD-10-CM | POA: Diagnosis not present

## 2021-09-12 DIAGNOSIS — G4733 Obstructive sleep apnea (adult) (pediatric): Secondary | ICD-10-CM | POA: Diagnosis not present

## 2021-09-12 DIAGNOSIS — N401 Enlarged prostate with lower urinary tract symptoms: Secondary | ICD-10-CM | POA: Diagnosis not present

## 2021-09-12 DIAGNOSIS — G4700 Insomnia, unspecified: Secondary | ICD-10-CM | POA: Diagnosis not present

## 2021-09-12 DIAGNOSIS — I150 Renovascular hypertension: Secondary | ICD-10-CM | POA: Diagnosis not present

## 2021-09-12 DIAGNOSIS — Z7901 Long term (current) use of anticoagulants: Secondary | ICD-10-CM | POA: Diagnosis not present

## 2021-09-12 DIAGNOSIS — M549 Dorsalgia, unspecified: Secondary | ICD-10-CM | POA: Diagnosis not present

## 2021-09-12 DIAGNOSIS — Z87440 Personal history of urinary (tract) infections: Secondary | ICD-10-CM | POA: Diagnosis not present

## 2021-09-12 DIAGNOSIS — R2681 Unsteadiness on feet: Secondary | ICD-10-CM | POA: Diagnosis not present

## 2021-09-12 DIAGNOSIS — M6259 Muscle wasting and atrophy, not elsewhere classified, multiple sites: Secondary | ICD-10-CM | POA: Diagnosis not present

## 2021-09-12 DIAGNOSIS — N390 Urinary tract infection, site not specified: Secondary | ICD-10-CM | POA: Diagnosis not present

## 2021-09-12 DIAGNOSIS — Z9181 History of falling: Secondary | ICD-10-CM | POA: Diagnosis not present

## 2021-09-12 DIAGNOSIS — G20 Parkinson's disease: Secondary | ICD-10-CM | POA: Diagnosis not present

## 2021-09-12 DIAGNOSIS — I119 Hypertensive heart disease without heart failure: Secondary | ICD-10-CM | POA: Diagnosis not present

## 2021-09-12 NOTE — Patient Outreach (Signed)
[facility] Post- Acute Care Coordinator follow up. Isaiah Pena currently resides in [facility].  Facility site visit to [employer]. Met with Amaryllis Dyke, SNF social workers, and Verdis Frederickson, Civil engineer, contracting. Therapy reports [name] has ups and downs with therapy. States recent UTI contributed to therapy setback. Continues to participate with therapy.   Will transition to long term care. Has not transitioned to hospice yet. Followed by [facility] for palliative care.   Will continue to follow.    Marthenia Rolling, MSN, RN,BSN [place] Acute Care Coordinator [phone] [facility]) [phone]  (Toll free office)

## 2021-09-13 ENCOUNTER — Inpatient Hospital Stay (HOSPITAL_COMMUNITY)
Admission: EM | Admit: 2021-09-13 | Discharge: 2021-09-22 | DRG: 682 | Disposition: A | Discharge status: Skilled Nursing Facility | Payer: Medicare Other | Source: Skilled Nursing Facility | Attending: Internal Medicine | Admitting: Internal Medicine

## 2021-09-13 ENCOUNTER — Other Ambulatory Visit: Payer: Self-pay

## 2021-09-13 ENCOUNTER — Encounter (HOSPITAL_COMMUNITY): Payer: Self-pay

## 2021-09-13 DIAGNOSIS — Z7401 Bed confinement status: Secondary | ICD-10-CM

## 2021-09-13 DIAGNOSIS — R0902 Hypoxemia: Secondary | ICD-10-CM | POA: Diagnosis not present

## 2021-09-13 DIAGNOSIS — Z7901 Long term (current) use of anticoagulants: Secondary | ICD-10-CM | POA: Diagnosis not present

## 2021-09-13 DIAGNOSIS — Z6841 Body Mass Index (BMI) 40.0 and over, adult: Secondary | ICD-10-CM | POA: Diagnosis not present

## 2021-09-13 DIAGNOSIS — N400 Enlarged prostate without lower urinary tract symptoms: Secondary | ICD-10-CM | POA: Diagnosis present

## 2021-09-13 DIAGNOSIS — E872 Acidosis, unspecified: Secondary | ICD-10-CM | POA: Diagnosis present

## 2021-09-13 DIAGNOSIS — Z515 Encounter for palliative care: Secondary | ICD-10-CM

## 2021-09-13 DIAGNOSIS — Z993 Dependence on wheelchair: Secondary | ICD-10-CM | POA: Diagnosis not present

## 2021-09-13 DIAGNOSIS — J811 Chronic pulmonary edema: Secondary | ICD-10-CM | POA: Diagnosis not present

## 2021-09-13 DIAGNOSIS — N179 Acute kidney failure, unspecified: Secondary | ICD-10-CM | POA: Diagnosis not present

## 2021-09-13 DIAGNOSIS — G9341 Metabolic encephalopathy: Secondary | ICD-10-CM | POA: Diagnosis not present

## 2021-09-13 DIAGNOSIS — R4182 Altered mental status, unspecified: Secondary | ICD-10-CM | POA: Diagnosis not present

## 2021-09-13 DIAGNOSIS — N19 Unspecified kidney failure: Secondary | ICD-10-CM

## 2021-09-13 DIAGNOSIS — I2510 Atherosclerotic heart disease of native coronary artery without angina pectoris: Secondary | ICD-10-CM | POA: Diagnosis not present

## 2021-09-13 DIAGNOSIS — Z79899 Other long term (current) drug therapy: Secondary | ICD-10-CM | POA: Diagnosis not present

## 2021-09-13 DIAGNOSIS — N319 Neuromuscular dysfunction of bladder, unspecified: Secondary | ICD-10-CM | POA: Diagnosis not present

## 2021-09-13 DIAGNOSIS — I10 Essential (primary) hypertension: Secondary | ICD-10-CM | POA: Diagnosis present

## 2021-09-13 DIAGNOSIS — R2681 Unsteadiness on feet: Secondary | ICD-10-CM | POA: Diagnosis not present

## 2021-09-13 DIAGNOSIS — I878 Other specified disorders of veins: Secondary | ICD-10-CM | POA: Diagnosis present

## 2021-09-13 DIAGNOSIS — T508X5A Adverse effect of diagnostic agents, initial encounter: Secondary | ICD-10-CM | POA: Diagnosis present

## 2021-09-13 DIAGNOSIS — I4821 Permanent atrial fibrillation: Secondary | ICD-10-CM | POA: Diagnosis present

## 2021-09-13 DIAGNOSIS — T839XXA Unspecified complication of genitourinary prosthetic device, implant and graft, initial encounter: Secondary | ICD-10-CM | POA: Diagnosis not present

## 2021-09-13 DIAGNOSIS — Z9181 History of falling: Secondary | ICD-10-CM | POA: Diagnosis not present

## 2021-09-13 DIAGNOSIS — N1411 Contrast-induced nephropathy: Secondary | ICD-10-CM | POA: Diagnosis not present

## 2021-09-13 DIAGNOSIS — G20 Parkinson's disease: Secondary | ICD-10-CM | POA: Diagnosis not present

## 2021-09-13 DIAGNOSIS — Z955 Presence of coronary angioplasty implant and graft: Secondary | ICD-10-CM | POA: Diagnosis not present

## 2021-09-13 DIAGNOSIS — E875 Hyperkalemia: Secondary | ICD-10-CM

## 2021-09-13 DIAGNOSIS — N170 Acute kidney failure with tubular necrosis: Principal | ICD-10-CM | POA: Diagnosis present

## 2021-09-13 DIAGNOSIS — G4700 Insomnia, unspecified: Secondary | ICD-10-CM | POA: Diagnosis not present

## 2021-09-13 DIAGNOSIS — N1330 Unspecified hydronephrosis: Secondary | ICD-10-CM | POA: Diagnosis not present

## 2021-09-13 DIAGNOSIS — M6259 Muscle wasting and atrophy, not elsewhere classified, multiple sites: Secondary | ICD-10-CM | POA: Diagnosis not present

## 2021-09-13 DIAGNOSIS — G20A1 Parkinson's disease without dyskinesia, without mention of fluctuations: Secondary | ICD-10-CM | POA: Diagnosis present

## 2021-09-13 DIAGNOSIS — E669 Obesity, unspecified: Secondary | ICD-10-CM | POA: Diagnosis present

## 2021-09-13 DIAGNOSIS — G4733 Obstructive sleep apnea (adult) (pediatric): Secondary | ICD-10-CM | POA: Diagnosis not present

## 2021-09-13 DIAGNOSIS — E785 Hyperlipidemia, unspecified: Secondary | ICD-10-CM | POA: Diagnosis present

## 2021-09-13 DIAGNOSIS — Z66 Do not resuscitate: Secondary | ICD-10-CM | POA: Diagnosis present

## 2021-09-13 DIAGNOSIS — K219 Gastro-esophageal reflux disease without esophagitis: Secondary | ICD-10-CM | POA: Diagnosis present

## 2021-09-13 DIAGNOSIS — I4819 Other persistent atrial fibrillation: Secondary | ICD-10-CM | POA: Diagnosis not present

## 2021-09-13 DIAGNOSIS — I119 Hypertensive heart disease without heart failure: Secondary | ICD-10-CM | POA: Diagnosis not present

## 2021-09-13 DIAGNOSIS — G253 Myoclonus: Secondary | ICD-10-CM | POA: Diagnosis present

## 2021-09-13 DIAGNOSIS — H109 Unspecified conjunctivitis: Secondary | ICD-10-CM | POA: Diagnosis not present

## 2021-09-13 DIAGNOSIS — R8271 Bacteriuria: Secondary | ICD-10-CM | POA: Diagnosis not present

## 2021-09-13 DIAGNOSIS — I4891 Unspecified atrial fibrillation: Secondary | ICD-10-CM | POA: Diagnosis not present

## 2021-09-13 DIAGNOSIS — N390 Urinary tract infection, site not specified: Secondary | ICD-10-CM | POA: Diagnosis not present

## 2021-09-13 DIAGNOSIS — N401 Enlarged prostate with lower urinary tract symptoms: Secondary | ICD-10-CM | POA: Diagnosis not present

## 2021-09-13 DIAGNOSIS — R140 Abdominal distension (gaseous): Secondary | ICD-10-CM | POA: Diagnosis not present

## 2021-09-13 DIAGNOSIS — R319 Hematuria, unspecified: Secondary | ICD-10-CM | POA: Diagnosis present

## 2021-09-13 DIAGNOSIS — K6389 Other specified diseases of intestine: Secondary | ICD-10-CM | POA: Diagnosis not present

## 2021-09-13 DIAGNOSIS — R7889 Finding of other specified substances, not normally found in blood: Secondary | ICD-10-CM | POA: Diagnosis not present

## 2021-09-13 DIAGNOSIS — J9811 Atelectasis: Secondary | ICD-10-CM | POA: Diagnosis not present

## 2021-09-13 LAB — COMPREHENSIVE METABOLIC PANEL
ALT: <5 U/L (ref 0–44)
AST: 8 U/L — ABNORMAL LOW (ref 15–41)
Albumin: 2.8 g/dL — ABNORMAL LOW (ref 3.5–5.0)
Alkaline Phosphatase: 78 U/L (ref 38–126)
Anion gap: 8 (ref 5–15)
BUN: 114 mg/dL — ABNORMAL HIGH (ref 8–23)
CO2: 26 mmol/L (ref 22–32)
Calcium: 8.3 mg/dL — ABNORMAL LOW (ref 8.9–10.3)
Chloride: 106 mmol/L (ref 98–111)
Creatinine, Ser: 9.98 mg/dL — ABNORMAL HIGH (ref 0.61–1.24)
GFR, Estimated: 5 mL/min — ABNORMAL LOW (ref >60)
Glucose, Bld: 129 mg/dL — ABNORMAL HIGH (ref 70–99)
Potassium: 5.5 mmol/L — ABNORMAL HIGH (ref 3.5–5.1)
Sodium: 140 mmol/L (ref 135–145)
Total Bilirubin: 0.5 mg/dL (ref 0.3–1.2)
Total Protein: 5.8 g/dL — ABNORMAL LOW (ref 6.5–8.1)

## 2021-09-13 LAB — I-STAT CHEM 8, ED
BUN: >130 mg/dL — ABNORMAL HIGH (ref 8–23)
Calcium, Ion: 1.07 mmol/L — ABNORMAL LOW (ref 1.15–1.40)
Chloride: 105 mmol/L (ref 98–111)
Creatinine, Ser: 10.5 mg/dL — ABNORMAL HIGH (ref 0.61–1.24)
Glucose, Bld: 120 mg/dL — ABNORMAL HIGH (ref 70–99)
HCT: 34 % — ABNORMAL LOW (ref 39.0–52.0)
Hemoglobin: 11.6 g/dL — ABNORMAL LOW (ref 13.0–17.0)
Potassium: 5.3 mmol/L — ABNORMAL HIGH (ref 3.5–5.1)
Sodium: 140 mmol/L (ref 135–145)
TCO2: 24 mmol/L (ref 22–32)

## 2021-09-13 LAB — CBC WITH DIFFERENTIAL/PLATELET
Abs Immature Granulocytes: 0.02 10*3/uL (ref 0.00–0.07)
Basophils Absolute: 0 10*3/uL (ref 0.0–0.1)
Basophils Relative: 1 %
Eosinophils Absolute: 0.4 10*3/uL (ref 0.0–0.5)
Eosinophils Relative: 5 %
HCT: 35.2 % — ABNORMAL LOW (ref 39.0–52.0)
Hemoglobin: 11.5 g/dL — ABNORMAL LOW (ref 13.0–17.0)
Immature Granulocytes: 0 %
Lymphocytes Relative: 12 %
Lymphs Abs: 0.9 10*3/uL (ref 0.7–4.0)
MCH: 33 pg (ref 26.0–34.0)
MCHC: 32.7 g/dL (ref 30.0–36.0)
MCV: 101.1 fL — ABNORMAL HIGH (ref 80.0–100.0)
Monocytes Absolute: 0.5 10*3/uL (ref 0.1–1.0)
Monocytes Relative: 7 %
Neutro Abs: 5.5 10*3/uL (ref 1.7–7.7)
Neutrophils Relative %: 75 %
Platelets: 244 10*3/uL (ref 150–400)
RBC: 3.48 MIL/uL — ABNORMAL LOW (ref 4.22–5.81)
RDW: 12.8 % (ref 11.5–15.5)
WBC: 7.4 10*3/uL (ref 4.0–10.5)
nRBC: 0 % (ref 0.0–0.2)

## 2021-09-13 LAB — CK: Total CK: 24 U/L — ABNORMAL LOW (ref 49–397)

## 2021-09-13 MED ORDER — SODIUM CHLORIDE 0.9 % IV BOLUS
1000.0000 mL | Freq: Once | INTRAVENOUS | Status: AC
  Administered 2021-09-13: 1000 mL via INTRAVENOUS

## 2021-09-13 MED ORDER — SODIUM ZIRCONIUM CYCLOSILICATE 10 G PO PACK
10.0000 g | PACK | Freq: Once | ORAL | Status: AC
  Administered 2021-09-14: 10 g via ORAL
  Filled 2021-09-13: qty 1

## 2021-09-13 NOTE — ED Triage Notes (Signed)
BIB [facility] with elevated BUN and Cr with decreased urine output. No c/o pain other than generalized chronic pain. Has MOST form at beside.

## 2021-09-14 ENCOUNTER — Inpatient Hospital Stay (HOSPITAL_COMMUNITY): Payer: Medicare Other

## 2021-09-14 DIAGNOSIS — E875 Hyperkalemia: Secondary | ICD-10-CM

## 2021-09-14 DIAGNOSIS — Z66 Do not resuscitate: Secondary | ICD-10-CM | POA: Diagnosis present

## 2021-09-14 DIAGNOSIS — Z515 Encounter for palliative care: Secondary | ICD-10-CM | POA: Diagnosis not present

## 2021-09-14 DIAGNOSIS — E785 Hyperlipidemia, unspecified: Secondary | ICD-10-CM | POA: Diagnosis present

## 2021-09-14 DIAGNOSIS — N1330 Unspecified hydronephrosis: Secondary | ICD-10-CM | POA: Diagnosis not present

## 2021-09-14 DIAGNOSIS — T508X5A Adverse effect of diagnostic agents, initial encounter: Secondary | ICD-10-CM | POA: Diagnosis present

## 2021-09-14 DIAGNOSIS — N19 Unspecified kidney failure: Secondary | ICD-10-CM

## 2021-09-14 DIAGNOSIS — I878 Other specified disorders of veins: Secondary | ICD-10-CM | POA: Diagnosis present

## 2021-09-14 DIAGNOSIS — G9341 Metabolic encephalopathy: Secondary | ICD-10-CM | POA: Diagnosis present

## 2021-09-14 DIAGNOSIS — Z79899 Other long term (current) drug therapy: Secondary | ICD-10-CM | POA: Diagnosis not present

## 2021-09-14 DIAGNOSIS — N400 Enlarged prostate without lower urinary tract symptoms: Secondary | ICD-10-CM | POA: Diagnosis present

## 2021-09-14 DIAGNOSIS — N1411 Contrast-induced nephropathy: Secondary | ICD-10-CM | POA: Diagnosis not present

## 2021-09-14 DIAGNOSIS — G20 Parkinson's disease: Secondary | ICD-10-CM | POA: Diagnosis present

## 2021-09-14 DIAGNOSIS — N170 Acute kidney failure with tubular necrosis: Secondary | ICD-10-CM | POA: Diagnosis present

## 2021-09-14 DIAGNOSIS — I2510 Atherosclerotic heart disease of native coronary artery without angina pectoris: Secondary | ICD-10-CM

## 2021-09-14 DIAGNOSIS — I4821 Permanent atrial fibrillation: Secondary | ICD-10-CM | POA: Diagnosis present

## 2021-09-14 DIAGNOSIS — K6389 Other specified diseases of intestine: Secondary | ICD-10-CM | POA: Diagnosis not present

## 2021-09-14 DIAGNOSIS — I10 Essential (primary) hypertension: Secondary | ICD-10-CM

## 2021-09-14 DIAGNOSIS — R0902 Hypoxemia: Secondary | ICD-10-CM

## 2021-09-14 DIAGNOSIS — Z7401 Bed confinement status: Secondary | ICD-10-CM | POA: Diagnosis not present

## 2021-09-14 DIAGNOSIS — R140 Abdominal distension (gaseous): Secondary | ICD-10-CM | POA: Diagnosis not present

## 2021-09-14 DIAGNOSIS — Z6841 Body Mass Index (BMI) 40.0 and over, adult: Secondary | ICD-10-CM | POA: Diagnosis not present

## 2021-09-14 DIAGNOSIS — J811 Chronic pulmonary edema: Secondary | ICD-10-CM | POA: Diagnosis not present

## 2021-09-14 DIAGNOSIS — I4819 Other persistent atrial fibrillation: Secondary | ICD-10-CM | POA: Diagnosis not present

## 2021-09-14 DIAGNOSIS — N179 Acute kidney failure, unspecified: Secondary | ICD-10-CM | POA: Diagnosis present

## 2021-09-14 DIAGNOSIS — G253 Myoclonus: Secondary | ICD-10-CM | POA: Diagnosis present

## 2021-09-14 DIAGNOSIS — E669 Obesity, unspecified: Secondary | ICD-10-CM

## 2021-09-14 DIAGNOSIS — J9811 Atelectasis: Secondary | ICD-10-CM | POA: Diagnosis not present

## 2021-09-14 DIAGNOSIS — Z7901 Long term (current) use of anticoagulants: Secondary | ICD-10-CM | POA: Diagnosis not present

## 2021-09-14 DIAGNOSIS — E872 Acidosis, unspecified: Secondary | ICD-10-CM | POA: Diagnosis present

## 2021-09-14 DIAGNOSIS — Z993 Dependence on wheelchair: Secondary | ICD-10-CM | POA: Diagnosis not present

## 2021-09-14 DIAGNOSIS — Z955 Presence of coronary angioplasty implant and graft: Secondary | ICD-10-CM | POA: Diagnosis not present

## 2021-09-14 DIAGNOSIS — K219 Gastro-esophageal reflux disease without esophagitis: Secondary | ICD-10-CM | POA: Diagnosis present

## 2021-09-14 LAB — URINALYSIS, ROUTINE W REFLEX MICROSCOPIC
Bilirubin Urine: NEGATIVE
Glucose, UA: NEGATIVE mg/dL
Ketones, ur: NEGATIVE mg/dL
Nitrite: NEGATIVE
Protein, ur: 100 mg/dL — AB
RBC / HPF: >50 RBC/hpf — ABNORMAL HIGH (ref 0–5)
Specific Gravity, Urine: 1.014 (ref 1.005–1.030)
pH: 5 (ref 5.0–8.0)

## 2021-09-14 LAB — CBC
HCT: 33.8 % — ABNORMAL LOW (ref 39.0–52.0)
Hemoglobin: 11.2 g/dL — ABNORMAL LOW (ref 13.0–17.0)
MCH: 33.1 pg (ref 26.0–34.0)
MCHC: 33.1 g/dL (ref 30.0–36.0)
MCV: 100 fL (ref 80.0–100.0)
Platelets: 232 10*3/uL (ref 150–400)
RBC: 3.38 MIL/uL — ABNORMAL LOW (ref 4.22–5.81)
RDW: 12.7 % (ref 11.5–15.5)
WBC: 7.7 10*3/uL (ref 4.0–10.5)
nRBC: 0 % (ref 0.0–0.2)

## 2021-09-14 LAB — BASIC METABOLIC PANEL
Anion gap: 9 (ref 5–15)
Anion gap: 13 (ref 5–15)
BUN: 111 mg/dL — ABNORMAL HIGH (ref 8–23)
BUN: 109 mg/dL — ABNORMAL HIGH (ref 8–23)
CO2: 23 mmol/L (ref 22–32)
CO2: 20 mmol/L — ABNORMAL LOW (ref 22–32)
Calcium: 8 mg/dL — ABNORMAL LOW (ref 8.9–10.3)
Calcium: 8.2 mg/dL — ABNORMAL LOW (ref 8.9–10.3)
Chloride: 106 mmol/L (ref 98–111)
Chloride: 105 mmol/L (ref 98–111)
Creatinine, Ser: 9.82 mg/dL — ABNORMAL HIGH (ref 0.61–1.24)
Creatinine, Ser: 10.48 mg/dL — ABNORMAL HIGH (ref 0.61–1.24)
GFR, Estimated: 5 mL/min — ABNORMAL LOW (ref >60)
GFR, Estimated: 5 mL/min — ABNORMAL LOW (ref >60)
Glucose, Bld: 109 mg/dL — ABNORMAL HIGH (ref 70–99)
Glucose, Bld: 96 mg/dL (ref 70–99)
Potassium: 4.8 mmol/L (ref 3.5–5.1)
Potassium: 5.1 mmol/L (ref 3.5–5.1)
Sodium: 138 mmol/L (ref 135–145)
Sodium: 138 mmol/L (ref 135–145)

## 2021-09-14 LAB — RENAL FUNCTION PANEL
Albumin: 2.9 g/dL — ABNORMAL LOW (ref 3.5–5.0)
Anion gap: 13 (ref 5–15)
BUN: 110 mg/dL — ABNORMAL HIGH (ref 8–23)
CO2: 19 mmol/L — ABNORMAL LOW (ref 22–32)
Calcium: 8.3 mg/dL — ABNORMAL LOW (ref 8.9–10.3)
Chloride: 107 mmol/L (ref 98–111)
Creatinine, Ser: 10.7 mg/dL — ABNORMAL HIGH (ref 0.61–1.24)
GFR, Estimated: 5 mL/min — ABNORMAL LOW (ref >60)
Glucose, Bld: 95 mg/dL (ref 70–99)
Phosphorus: 6.4 mg/dL — ABNORMAL HIGH (ref 2.5–4.6)
Potassium: 5.2 mmol/L — ABNORMAL HIGH (ref 3.5–5.1)
Sodium: 139 mmol/L (ref 135–145)

## 2021-09-14 LAB — CREATININE, URINE, RANDOM: Creatinine, Urine: 77 mg/dL

## 2021-09-14 LAB — SODIUM, URINE, RANDOM: Sodium, Ur: 27 mmol/L

## 2021-09-14 MED ORDER — CHLORHEXIDINE GLUCONATE CLOTH 2 % EX PADS
6.0000 | MEDICATED_PAD | Freq: Every day | CUTANEOUS | Status: DC
  Administered 2021-09-14 – 2021-09-20 (×6): 6 via TOPICAL

## 2021-09-14 MED ORDER — CARBIDOPA-LEVODOPA 25-100 MG PO TABS
3.0000 | ORAL_TABLET | ORAL | Status: DC
Start: 2021-09-14 — End: 2021-09-22
  Administered 2021-09-14 – 2021-09-21 (×20): 3 via ORAL
  Filled 2021-09-14 (×23): qty 3

## 2021-09-14 MED ORDER — LACTATED RINGERS IV SOLN
INTRAVENOUS | Status: AC
Start: 2021-09-14 — End: 2021-09-14

## 2021-09-14 MED ORDER — ENTACAPONE 200 MG PO TABS
200.0000 mg | ORAL_TABLET | ORAL | Status: DC
  Administered 2021-09-14 – 2021-09-21 (×20): 200 mg via ORAL
  Filled 2021-09-14 (×28): qty 1

## 2021-09-14 NOTE — Assessment & Plan Note (Addendum)
Baseline oriented to self, family at place. Has occasional hallucination. Mostly wheelchair bound.  -continue Sinemet, entacapone -Hold amantadine due to acute renal failure

## 2021-09-14 NOTE — Plan of Care (Signed)
  Problem: Clinical Measurements: Goal: Will remain free from infection Outcome: Progressing Goal: Diagnostic test results will improve Outcome: Progressing   

## 2021-09-14 NOTE — Assessment & Plan Note (Signed)
Controlled.  Resume antihypertensives pending med rec.

## 2021-09-14 NOTE — Assessment & Plan Note (Addendum)
Creatinine of 10 from baseline of 1.  Unclear cause of this acute renal failure.  He has indwelling Foley but there is still urine output although minimal. No decrease fluid intake or vomiting or diarrhea.  - Nephrology recommends Foley flushes and will consult in the morning -Keep on IV LR and monitor output -Obtain UA  -Obtain renal ultrasound -obtain urine sodium and creatinine -Avoid nephrotoxic agent -Will renally dose all medications

## 2021-09-14 NOTE — ED Notes (Signed)
pt stated he thought he had a bowel movement, and when another nurse and I went to change him, pt only had a clear, colorless, odorless, jelly-like substance coming out of rectum. Wife said the staff at his SNF said his BM was like that yesterday. This nurse checked bowel sounds and they are active in all 4 quadrants but wife feels like abd a little more distended than usual, but is soft/nontender. Provider notified

## 2021-09-14 NOTE — Progress Notes (Signed)
Isaiah Pena is a 72 y.o. male with medical history significant of  Parkinson's disease, chronic urinary retention with indwelling foley, persistent A.fib on Xarelto, CAD s/p PCI, HTN, HLD who presents with increasing lethargy and confusion and found to have acute renal failure.  Pt admitted earlier this am by Dr Flossie Buffy, please see note for details.  On exam  General exam: Appears calm and comfortable  Respiratory system: DIMINISHED at bases.  Cardiovascular system: S1 & S2 heard, RRR. No JVD,No pedal edema. Gastrointestinal system: Abdomen is nondistended, soft and nontender. Normal bowel sounds heard. Central nervous system: Alert , but confused, with jerks.  Extremities: Symmetric 5 x 5 power. Skin: No rashes Psychiatry:  unable to assess.    On further talking to his wife at bedside, she reports his abd is distended, but he denies any pain. He underwent CT abdomen and pelvis on 09/02/2021.  We will get a abd x ray to check for obstruction. He appears to have a BM this am.  Nephrology on board for AKI. Check labs this afternoon .  Continue to monitor.    Hosie Poisson, MD

## 2021-09-14 NOTE — Assessment & Plan Note (Signed)
Stable

## 2021-09-14 NOTE — Assessment & Plan Note (Signed)
K of 5.3. Given Lokelma x1.

## 2021-09-14 NOTE — Assessment & Plan Note (Signed)
BMI of 36 

## 2021-09-14 NOTE — Assessment & Plan Note (Signed)
Rate controlled. Holding Xarelto due to ARF

## 2021-09-14 NOTE — Consult Note (Signed)
Isaiah Pena Admit Date: 09/13/2021 09/14/2021 Isaiah Pena Requesting Physician:  Tyrone Nine DO  Reason for Consult:  AKI HPI:  70M presented to the ED yesterday with 2 to 3 days of progressive confusion, lethargy, and found to have AKI.  PMH includes long-term SNF resident at [place], [place] disease, chronic urinary retention with indwelling Foley, history of UTI, atrial fibrillation on Xarelto, CAD with history of PCI, hypertension, hyperlipidemia.  Wife at bedside providing supportive history.  Tramadol was resumed just recently.  Oral intake dropped off over the past 3 to 4 days.  Foley output also diminished quite a bit.  No edema, dyspnea, chest pain.  He is having increasing jerking/myoclonic events in his upper arms.  He was seen in the ED 8/14 after a fall.  There was some concern that the Foley catheter had been pulled with some traumatic hematuria.  He had a CT of the abdomen at that time with contrast.  He has normal GFR baseline.  Presenting creatinine is 9.98, is 9.8 this morning.  BUN is 111, K4.8, HCO3 23.  Minimal urine in the Foley bag.  CK is 24.  Urine analysis with hematuria, 6-10 leukocytes per high-powered field, few bacteria.  Renal ultrasound performed demonstrates 13 cm right kidney with mild hydronephrosis; left kidney is 12 cm without hydronephrosis.  Bladder is decompressed.  Since arrival patient received 1 L saline bolus and now LR at 75 mL/h.  He received Lokelma for potassium of 5.5.   Creat (mg/dL)  Date Value  11/09/2015 0.86  05/15/2015 0.74  05/07/2015 0.70  04/27/2015 0.76   Creatinine, Ser (mg/dL)  Date Value  09/14/2021 9.82 (H)  09/13/2021 10.50 (H)  09/13/2021 9.98 (H)  09/02/2021 1.04  07/20/2021 1.04  07/11/2021 0.97  06/16/2021 0.93  06/13/2021 0.65  06/12/2021 0.73  06/04/2021 0.75  ] I/Os:  ROS NSAIDS: No exposure IV Contrast had contrast exposure on 8/14 when received CT abdomen pelvis after fall TMP/SMX no  exposure Hypotension no exposure Balance of 12 systems is negative w/ exceptions as above  PMH  Past Medical History:  Diagnosis Date   Anticoagulant long-term use    xarelto   Bilateral lower extremity edema    BPH (benign prostatic hyperplasia)    BPH (benign prostatic hyperplasia)    Central serous retinopathy    LOSS OF CENTER VISION LEFT EYE W/ BLURRED VISION-  was treated  methotrexate by specialist at [facility] and released   Coronary artery disease cardiologist-  dr Tressia Miners turner   01/ 2004  abnormal cardiolite  s/p  cardiac cath w/ PCI and DES to dRCA   Edema of extremities    CHRONIC    Erectile dysfunction    GERD (gastroesophageal reflux disease)    History of colon polyps    2011 per pt benign   History of kidney stones    History of simple renal cyst    drained via radiology   History of torn meniscus of right knee    Hyperlipidemia    Hypertension    Kidney stone    HX OF STONE-PASSED    Lower urinary tract symptoms (LUTS)    OSA (obstructive sleep apnea) 09/29/2011   NPSG 2004:  AHI 25/hr  Rx with bilevel.      OSA treated with BiPAP    moderate per study 01-23-2002   Parkinson's disease [facility])    neurologist-  dr ed hill ([facility])   Permanent atrial fibrillation ([place])    Persistent atrial  fibrillation ([place]) 11/08/2012   S/P drug eluting coronary stent placement    01/ 2004  x1 to [facility]   [facility]  Past Surgical History:  Procedure Laterality Date   CARDIOVASCULAR STRESS TEST  05-07-2015   dr Tressia Miners turner   normal nuclear study w/ no ischemia/  normal LV function and wall motion , stress ef 55%   COLONOSCOPY WITH PROPOFOL  2011   CORONARY ANGIOPLASTY WITH STENT PLACEMENT  02-11-2002  dr Daneen Schick   abnormal cardiolite--  PCI and DES to dRCA (99%),  pRCA luminal irregularities, mild to moderate LAD and CFX disease,  normal LVF   CYSTOSCOPY WITH INSERTION OF UROLIFT N/A 01/28/2016   Procedure: CYSTOSCOPY WITH INSERTION OF UROLIFT;  Surgeon: Franchot Gallo, MD;  [facility];  Service: Urology;  Laterality: N/A;   HERNIA REPAIR     AGE 47   KNEE ARTHROSCOPY  03/21/2011   Procedure: ARTHROSCOPY KNEE;  Surgeon: Tobi Bastos, MD;  Location: [facility];  Service: Orthopedics;  Laterality: Right;   FH History reviewed. No pertinent family history. SH  reports that he has never smoked. He has never used smokeless tobacco. He reports that he does not drink alcohol and does not use drugs. Allergies  Allergies  Allergen Reactions   Sulfa Antibiotics Hives   Home medications Prior to Admission medications   Medication Sig Start Date End Date Taking? Authorizing Provider  acetaminophen (TYLENOL) 650 MG CR tablet Take 2 tablets (1,300 mg total) by mouth every 8 (eight) hours as needed for pain. 06/05/21  Yes Setzer, Edman Circle, PA-C  Amantadine HCl 100 MG tablet Take 100 mg by mouth daily. 09/12/21  Yes [provider]  amLODipine (NORVASC) 10 MG tablet Take 1 tablet (10 mg total) by mouth daily. 06/17/21 06/17/22 Yes Mariel Aloe, MD  atorvastatin (LIPITOR) 10 MG tablet TAKE 1/2 TABLET BY MOUTH EVERY OTHER DAY AND ALTERNATING 1 TABLET BY MOUTH EVERY OTHER DAY. Please keep upcoming appt in April 2023 before anymore refills. Thank you Patient taking differently: Take 5 mg by mouth at bedtime. 02/05/21  Yes Turner, Eber Hong, MD  carbidopa-levodopa (SINEMET IR) 25-100 MG tablet Take 3 tablets by mouth 3 (three) times daily. 05/03/16  Yes [provider]  cephALEXin (KEFLEX) 500 MG capsule Take 500 mg by mouth at bedtime. For UTI prophylaxis   Yes [provider]  clonazePAM (KLONOPIN) 0.5 MG tablet Take 0.5 tablets (0.25 mg total) by mouth 2 (two) times daily as needed for anxiety. Patient taking differently: Take 0.25 mg by mouth at bedtime. 06/17/21  Yes Mariel Aloe, MD  clotrimazole (LOTRIMIN) 1 % cream Apply 1 Application topically 2 (two) times daily. For yeast   Yes [provider]   diclofenac Sodium (VOLTAREN) 1 % GEL Apply 2 g topically daily. For low back and knee pain   Yes [provider]  entacapone (COMTAN) 200 MG tablet Take 200 mg by mouth 3 (three) times daily. 09/16/14  Yes [provider]  erythromycin ophthalmic ointment Place 1 Application into both eyes at bedtime.   Yes [provider]  escitalopram (LEXAPRO) 5 MG tablet Take 5 mg by mouth daily.   Yes [provider]  esomeprazole (NEXIUM) 20 MG capsule Take 20 mg by mouth every other day. OTC   Yes [provider]  ezetimibe (ZETIA) 10 MG tablet Take 10 mg by mouth daily. 06/02/21  Yes [provider]  finasteride (PROSCAR) 5 MG tablet TAKE  1 TABLET(5 MG) BY MOUTH DAILY Patient taking differently: Take 5 mg by mouth daily. 06/05/21  Yes Setzer, Edman Circle, PA-C  lidocaine 4 % Place 1 patch onto the skin daily. Apply right lower back   Yes [provider]  magnesium oxide (MAG-OX) 400 MG tablet Take 400 mg by mouth 2 (two) times daily.   Yes [provider]  metoprolol (LOPRESSOR) 50 MG tablet Take 50 mg by mouth 2 (two) times daily.   Yes [provider]  MULTIPLE VITAMIN-FOLIC ACID PO Take 1 tablet by mouth daily.   Yes [provider]  Olopatadine HCl (PATADAY) 0.2 % SOLN Place 2 drops into both eyes daily.   Yes [provider]  polyethylene glycol (MIRALAX / GLYCOLAX) 17 g packet Take 17 g by mouth daily as needed. Patient taking differently: Take 17 g by mouth daily. 06/17/21  Yes Mariel Aloe, MD  potassium chloride SA (KLOR-CON M) 20 MEQ tablet Take 1 tablet (20 mEq total) by mouth every other day. 06/05/21  Yes Setzer, Edman Circle, PA-C  rivaroxaban (XARELTO) 20 MG TABS tablet Take 1 tablet (20 mg total) by mouth daily with supper. Please keep upcoming telemedicine appointment in June. Thank you. 06/06/21  Yes Turner, Eber Hong, MD  tadalafil (CIALIS) 5 MG tablet Take 5 mg by mouth at bedtime.   Yes [provider]  traMADol (ULTRAM) 50 MG tablet Take 1 tablet (50 mg total) by mouth 3 (three) times daily as needed (pain). Patient taking differently: Take 25 mg by mouth every 6 (six) hours as needed for moderate pain. 06/17/21  Yes Mariel Aloe, MD  feeding supplement (ENSURE ENLIVE / ENSURE PLUS) LIQD Take 237 mLs by mouth 2 (two) times daily between meals. Patient not taking: Reported on 09/14/2021 06/17/21   Mariel Aloe, MD  magnesium gluconate (MAGONATE) 500 MG tablet Take 1 tablet (500 mg total) by mouth 2 (two) times daily. Patient not taking: Reported on 09/14/2021 06/05/21   Barbie Banner, PA-C  Multiple Vitamin (MULTIVITAMIN WITH MINERALS) TABS tablet Take 1 tablet by mouth daily. Patient not taking: Reported on 07/05/2021 06/18/21   Mariel Aloe, MD    Current Medications Scheduled Meds:  carbidopa-levodopa  3 tablet Oral 3 times per day   entacapone  200 mg Oral 3 times per day   Continuous Infusions:  lactated ringers 75 mL/hr at 09/14/21 0256   PRN Meds:.  CBC Recent Labs  Lab 09/13/21 2313 09/13/21 2343 09/14/21 0256  WBC 7.4  --  7.7  NEUTROABS 5.5  --   --   HGB 11.5* 11.6* 11.2*  HCT 35.2* 34.0* 33.8*  MCV 101.1*  --  100.0  PLT 244  --  175   Basic Metabolic Panel Recent Labs  Lab 09/13/21 2313 09/13/21 2343 09/14/21 0256  NA 140 140 138  K 5.5* 5.3* 4.8  CL 106 105 106  CO2 26  --  23  GLUCOSE 129* 120* 109*  BUN 114* >130* 111*  CREATININE 9.98* 10.50* 9.82*  CALCIUM 8.3*  --  8.0*    Physical Exam   Blood pressure 130/86, pulse 80, temperature 97.8 F (36.6 C), resp. rate 14, height '5\' 8"'$  (1.727 m), weight 109.3 kg, SpO2 100 %. GEN: Chronically ill-appearing, awake, conversant ENT: NCAT EYES: Glasses CV: Regular, normal S1 and S2 PULM: CTA B ABD: Soft, nontender, adipose, no suprapubic fullness or tenderness SKIN: Hyperpigmentation consistent with chronic venous stasis noted in the bilateral lower extremities  EXT: No peripheral  edema NEURO: Myoclonus noted, awake, alert, interactive  Assessment 77M with AKI, normal baseline SCr.  Possible contrast nephropathy after 8/14 CT scan for trauma; hypovolemia also possible from poor p.o; need to confirm Foley draining properly after urethral trauma 8/14.Marland Kitchen  Has uremia and myoclonus.  AKI: ATN versus hypovolemia CT abdomen pelvis 8/14 with contrast exposure Renal ultrasound here with decompressed bladder, mild left hydronephrosis, previous mention of bilateral prominent extrarenal pelvises noted. Urine analysis with hematuria, not much pyuria Confusion and myoclonus consistent with uremia Parkinson's disease, resides in SNF A-fib on Xarelto Mild hyperkalemia, improved with Lokelma Chronic urinary retention with indwelling Foley catheter CAD with history of PCI  Plan Trial of fluids, but I am worried that he will not respond very well Very likely has some degree of ATN from contrast exposure If GFR does not improve and uremic symptoms continue will need to discuss dialysis, but he is a marginal candidate because of his comorbidities, at least for long-term dialysis Repeat labs this afternoon Continue LR hydration at 75 mils per hour Daily weights, Daily Renal Panel, Strict I/Os, Avoid nephrotoxins (NSAIDs, judicious IV Contrast)    Isaiah Pena  09/14/2021, 5:40 AM

## 2021-09-14 NOTE — Assessment & Plan Note (Signed)
Questionable hypoxemia.  Apparently patient had oxygen saturation down to 70% briefly although this was improved after adjustment of pulse ox.  He was placed on oxygen supplementation via nasal cannula -check CXR

## 2021-09-14 NOTE — H&P (Signed)
History and Physical    Patient: Isaiah Pena IPJ:[id number] DOB: 02-Feb-1950 DOA: 09/13/2021 DOS: the patient was seen and examined on 09/14/2021 PCP: Mayra Neer, MD  Patient coming from: Home  Chief Complaint:  Chief Complaint  Patient presents with   Abnormal Labs   HPI: Isaiah Pena is a 71 y.o. male with medical history significant of  Parkinson's disease, chronic urinary retention with indwelling foley, persistent A.fib on Xarelto, CAD s/p PCI, HTN, HLD who presents with increasing lethargy and confusion and found to have acute renal failure.  Wife at bedside provides history.  She noticed for the past 3 days he has been more confused, worsening hallucinations and also increased lethargy.  He had tramadol resumed in the past few days.  Patient has been living in SNF since June due to increasing weakness and inability to ambulate without assistance.  Lab work was obtained today showing acute renal failure and he was transferred to the ED.  He has had chronic urinary retention with chronic Foley catheter followed by [facility] Dr. Diona Fanti.  Had up to 4 voiding trials without success.  Wife declined recent one in August since she no longer wants him undergo trauma.  Has had on and off UTI last treated with Cipro last week.  Also is on prophylactic cephalexin daily.  Urine output is decreased due to no more than 30 cc today.  In the ED, he was lethargic with altered mental status.  Afebrile normotensive and placed on 2 L.  Creatinine of 10.50 up from baseline of around 1 with greater than 130 BUN.  Mild hyperkalemia 5.3.  ED physician discussed with nephrology Dr. Carmina Miller who recommends flushing Foley and will see in consultation in the morning.  Hospitalist on-call for admission.   Review of Systems: unable to review all systems due to the inability of the patient to answer questions. Past Medical History:  Diagnosis Date   Anticoagulant long-term use    xarelto    Bilateral lower extremity edema    BPH (benign prostatic hyperplasia)    BPH (benign prostatic hyperplasia)    Central serous retinopathy    LOSS OF CENTER VISION LEFT EYE W/ BLURRED VISION-  was treated  methotrexate by specialist at [facility] and released   Coronary artery disease cardiologist-  dr Tressia Miners turner   01/ 2004  abnormal cardiolite  s/p  cardiac cath w/ PCI and DES to dRCA   Edema of extremities    CHRONIC    Erectile dysfunction    GERD (gastroesophageal reflux disease)    History of colon polyps    2011 per pt benign   History of kidney stones    History of simple renal cyst    drained via radiology   History of torn meniscus of right knee    Hyperlipidemia    Hypertension    Kidney stone    HX OF STONE-PASSED    Lower urinary tract symptoms (LUTS)    OSA (obstructive sleep apnea) 09/29/2011   NPSG 2004:  AHI 25/hr  Rx with bilevel.      OSA treated with BiPAP    moderate per study 01-23-2002   Parkinson's disease [facility])    neurologist-  dr ed hill ([facility])   Permanent atrial fibrillation [facility])    Persistent atrial fibrillation ([place]) 11/08/2012   S/P drug eluting coronary stent placement    01/ 2004  x1 to dRCA   Past Surgical History:  Procedure Laterality Date   CARDIOVASCULAR  STRESS TEST  05-07-2015   dr Tressia Miners turner   normal nuclear study w/ no ischemia/  normal LV function and wall motion , stress ef 55%   COLONOSCOPY WITH PROPOFOL  2011   CORONARY ANGIOPLASTY WITH STENT PLACEMENT  02-11-2002  dr Daneen Schick   abnormal cardiolite--  PCI and DES to dRCA (99%),  pRCA luminal irregularities, mild to moderate LAD and CFX disease,  normal LVF   CYSTOSCOPY WITH INSERTION OF UROLIFT N/A 01/28/2016   Procedure: CYSTOSCOPY WITH INSERTION OF UROLIFT;  Surgeon: Franchot Gallo, MD;  Location: [facility];  Service: Urology;  Laterality: N/A;   HERNIA REPAIR     AGE 35   KNEE ARTHROSCOPY  03/21/2011   Procedure: ARTHROSCOPY KNEE;  Surgeon: Tobi Bastos, MD;  Location: [facility];  Service: Orthopedics;  Laterality: Right;   Social History:  reports that he has never smoked. He has never used smokeless tobacco. He reports that he does not drink alcohol and does not use drugs.  Allergies  Allergen Reactions   Sulfa Antibiotics Hives    History reviewed. No pertinent family history.  Prior to Admission medications   Medication Sig Start Date End Date Taking? Authorizing Provider  acetaminophen (TYLENOL) 650 MG CR tablet Take 2 tablets (1,300 mg total) by mouth every 8 (eight) hours as needed for pain. 06/05/21   Setzer, Edman Circle, PA-C  amantadine (SYMMETREL) 100 MG capsule Take 100 mg by mouth See admin instructions. Take one capsule (100 mg) by mouth daily at bedtime - 1am 03/14/21   [provider]  amLODipine (NORVASC) 10 MG tablet Take 1 tablet (10 mg total) by mouth daily. 06/17/21 06/17/22  Mariel Aloe, MD  atorvastatin (LIPITOR) 10 MG tablet TAKE 1/2 TABLET BY MOUTH EVERY OTHER DAY AND ALTERNATING 1 TABLET BY MOUTH EVERY OTHER DAY. Please keep upcoming appt in April 2023 before anymore refills. Thank you Patient taking differently: Take 5-10 mg by mouth See admin instructions. Take 1/2 tablet (5 mg) by mouth every other evening, alternate with 1 tablet (10 mg) every other evening. Please keep upcoming appt in April 2023 before anymore refills. Thank you 02/05/21   Sueanne Margarita, MD  carbidopa-levodopa (SINEMET IR) 25-100 MG tablet Take 3 tablets by mouth every 8 (eight) hours. 8am, 5pm and 1am 05/03/16   [provider]  clonazePAM (KLONOPIN) 0.5 MG tablet Take 0.5 tablets (0.25 mg total) by mouth 2 (two) times daily as needed for anxiety. 06/17/21   Mariel Aloe, MD  entacapone (COMTAN) 200 MG tablet Take 200 mg by mouth every 8 (eight) hours. 9am, 5pm, 1am 09/16/14   [provider]  esomeprazole (NEXIUM) 20 MG capsule Take 20 mg by mouth every other day. OTC    [provider]  ezetimibe (ZETIA)  10 MG tablet Take 10 mg by mouth daily. 06/02/21   [provider]  feeding supplement (ENSURE ENLIVE / ENSURE PLUS) LIQD Take 237 mLs by mouth 2 (two) times daily between meals. 06/17/21   Mariel Aloe, MD  finasteride (PROSCAR) 5 MG tablet TAKE 1 TABLET(5 MG) BY MOUTH DAILY Patient taking differently: Take 5 mg by mouth daily. 06/05/21   Setzer, Edman Circle, PA-C  magnesium gluconate (MAGONATE) 500 MG tablet Take 1 tablet (500 mg total) by mouth 2 (two) times daily. 06/05/21   Setzer, Edman Circle, PA-C  metoprolol (LOPRESSOR) 50 MG tablet Take 50 mg by mouth 2 (two) times daily.    [provider]  Multiple Vitamin (MULTIVITAMIN WITH MINERALS) TABS tablet Take 1 tablet by mouth daily. Patient not taking: Reported on 07/05/2021 06/18/21   Mariel Aloe, MD  polyethylene glycol (MIRALAX / GLYCOLAX) 17 g packet Take 17 g by mouth daily as needed. 06/17/21   Mariel Aloe, MD  potassium chloride SA (KLOR-CON M) 20 MEQ tablet Take 1 tablet (20 mEq total) by mouth every other day. 06/05/21   Setzer, Edman Circle, PA-C  rivaroxaban (XARELTO) 20 MG TABS tablet Take 1 tablet (20 mg total) by mouth daily with supper. Please keep upcoming telemedicine appointment in June. Thank you. 06/06/21   Sueanne Margarita, MD  tadalafil (CIALIS) 5 MG tablet Take 5 mg by mouth every evening.    [provider]  traMADol (ULTRAM) 50 MG tablet Take 1 tablet (50 mg total) by mouth 3 (three) times daily as needed (pain). 06/17/21   Mariel Aloe, MD    Physical Exam: Vitals:   09/13/21 2300 09/13/21 2315 09/13/21 2330 09/13/21 2345  BP: 126/79 112/78 108/78 123/77  Pulse: 73 79 66 95  Resp: '18 15 15 17  '$ Temp:      SpO2: 100% 96% 96% 99%  Weight:      Height:       Constitutional: NAD, lethargic chronically ill-appearing obese male laying upright in bed Eyes: lids and conjunctivae normal ENMT: Mucous membranes are moist. Neck: normal, supple Respiratory: clear to auscultation bilaterally, no  wheezing, no crackles. Normal respiratory effort. No accessory muscle use.  Cardiovascular: Regular rate and rhythm, no murmurs / rubs / gallops. No extremity edema. Abdomen: Soft, nontender nondistended.  Bowel sounds positive.  GU: Foley catheter in place with dark concentrated urine about 30 cc. Musculoskeletal: no clubbing / cyanosis. No joint deformity upper and lower extremities.  Skin: no rashes, lesions, ulcers.  Neurologic: Alert and oriented to self and son at bedside but could not provide wife's name.  Lethargic but still able to follow commands.  Strength of 3 out of 5 in lower extremity.   Psychiatric: Unable to assess due to altered mental status Data Reviewed:  See HPI  Assessment and Plan: * Acute renal failure (ARF) ([place]) Creatinine of 10 from baseline of 1.  Unclear cause of this acute renal failure.  He has indwelling Foley but there is still urine output although minimal. No decrease fluid intake or vomiting or diarrhea.  - Nephrology recommends Foley flushes and will consult in the morning -Keep on IV LR and monitor output -Obtain UA  -Obtain renal ultrasound -obtain urine sodium and creatinine -Avoid nephrotoxic agent -Will renally dose all medications  Persistent atrial fibrillation (H[facility]) Rate controlled. Holding Xarelto due to ARF  Parkinson disease ([place]) Baseline oriented to self, family at place. Has occasional hallucination. Mostly wheelchair bound.  -continue Sinemet, entacapone -Hold amantadine due to acute renal failure  Hypertension Controlled.  Resume antihypertensives pending med rec.  Hypoxia Questionable hypoxemia.  Apparently patient had oxygen saturation down to 70% briefly although this was improved after adjustment of pulse ox.  He was placed on oxygen supplementation via nasal cannula -check CXR  Hyperkalemia K of 5.3. Given Lokelma x1.   Acute metabolic encephalopathy Secondary to uremia from acute renal failure in the setting of  advanced Parkinson. Tx as above.  Obesity (BMI 30-39.9) BMI of 36  Coronary artery disease s/p PCI to RCA  Stable.       Advance Care Planning:   Code Status: DNR   Consults: Nephrology  Family Communication:  Discussed with wife and son at bedside  Severity of Illness: The appropriate patient status for this patient is INPATIENT. Inpatient status is judged to be reasonable and necessary in order to provide the required intensity of service to ensure the patient's safety. The patient's presenting symptoms, physical exam findings, and initial radiographic and laboratory data in the context of their chronic comorbidities is felt to place them at high risk for further clinical deterioration. Furthermore, it is not anticipated that the patient will be medically stable for discharge from the hospital within 2 midnights of admission.   * I certify that at the point of admission it is my clinical judgment that the patient will require inpatient hospital care spanning beyond 2 midnights from the point of admission due to high intensity of service, high risk for further deterioration and high frequency of surveillance required.*  Author: Orene Desanctis, DO 09/14/2021 2:13 AM  For on call review [email or web address].

## 2021-09-14 NOTE — ED Provider Notes (Signed)
[facility] EMERGENCY DEPARTMENT Provider Note   CSN: [id number] Arrival date & time: 09/13/21  2238     History  Chief Complaint  Patient presents with   Abnormal Labs    Isaiah Pena is a 71 y.o. male.  71 yo M with a chief complaints of weakness and confusion.  Family is noted a difference over the past week.  He had blood work obtained at his skilled nursing facility and noted to have acute renal failure.  He was then sent here for evaluation.  They deny any recent medication changes.  Was seen in the emergency department about 10 days ago for a fall.  Has had decreased urine output today.  Has an indwelling Foley and has not noticed any problems with it.  Denies abdominal pain denies flank pain.  Denies cough congestion or fever.        Home Medications Prior to Admission medications   Medication Sig Start Date End Date Taking? Authorizing Provider  acetaminophen (TYLENOL) 650 MG CR tablet Take 2 tablets (1,300 mg total) by mouth every 8 (eight) hours as needed for pain. 06/05/21   Setzer, Edman Circle, PA-C  amantadine (SYMMETREL) 100 MG capsule Take 100 mg by mouth See admin instructions. Take one capsule (100 mg) by mouth daily at bedtime - 1am 03/14/21   [provider]  amLODipine (NORVASC) 10 MG tablet Take 1 tablet (10 mg total) by mouth daily. 06/17/21 06/17/22  Mariel Aloe, MD  atorvastatin (LIPITOR) 10 MG tablet TAKE 1/2 TABLET BY MOUTH EVERY OTHER DAY AND ALTERNATING 1 TABLET BY MOUTH EVERY OTHER DAY. Please keep upcoming appt in April 2023 before anymore refills. Thank you Patient taking differently: Take 5-10 mg by mouth See admin instructions. Take 1/2 tablet (5 mg) by mouth every other evening, alternate with 1 tablet (10 mg) every other evening. Please keep upcoming appt in April 2023 before anymore refills. Thank you 02/05/21   Sueanne Margarita, MD  carbidopa-levodopa (SINEMET IR) 25-100 MG tablet Take 3 tablets by mouth every 8 (eight)  hours. 8am, 5pm and 1am 05/03/16   [provider]  clonazePAM (KLONOPIN) 0.5 MG tablet Take 0.5 tablets (0.25 mg total) by mouth 2 (two) times daily as needed for anxiety. 06/17/21   Mariel Aloe, MD  entacapone (COMTAN) 200 MG tablet Take 200 mg by mouth every 8 (eight) hours. 9am, 5pm, 1am 09/16/14   [provider]  esomeprazole (NEXIUM) 20 MG capsule Take 20 mg by mouth every other day. OTC    [provider]  ezetimibe (ZETIA) 10 MG tablet Take 10 mg by mouth daily. 06/02/21   [provider]  feeding supplement (ENSURE ENLIVE / ENSURE PLUS) LIQD Take 237 mLs by mouth 2 (two) times daily between meals. 06/17/21   Mariel Aloe, MD  finasteride (PROSCAR) 5 MG tablet TAKE 1 TABLET(5 MG) BY MOUTH DAILY Patient taking differently: Take 5 mg by mouth daily. 06/05/21   Setzer, Edman Circle, PA-C  magnesium gluconate (MAGONATE) 500 MG tablet Take 1 tablet (500 mg total) by mouth 2 (two) times daily. 06/05/21   Setzer, Edman Circle, PA-C  metoprolol (LOPRESSOR) 50 MG tablet Take 50 mg by mouth 2 (two) times daily.    [provider]  Multiple Vitamin (MULTIVITAMIN WITH MINERALS) TABS tablet Take 1 tablet by mouth daily. Patient not taking: Reported on 07/05/2021 06/18/21   Mariel Aloe, MD  polyethylene glycol (MIRALAX / GLYCOLAX) 17 g packet Take 17  g by mouth daily as needed. 06/17/21   Mariel Aloe, MD  potassium chloride SA (KLOR-CON M) 20 MEQ tablet Take 1 tablet (20 mEq total) by mouth every other day. 06/05/21   Setzer, Edman Circle, PA-C  rivaroxaban (XARELTO) 20 MG TABS tablet Take 1 tablet (20 mg total) by mouth daily with supper. Please keep upcoming telemedicine appointment in June. Thank you. 06/06/21   Sueanne Margarita, MD  tadalafil (CIALIS) 5 MG tablet Take 5 mg by mouth every evening.    [provider]  traMADol (ULTRAM) 50 MG tablet Take 1 tablet (50 mg total) by mouth 3 (three) times daily as needed (pain). 06/17/21   Mariel Aloe, MD       Allergies    Sulfa antibiotics    Review of Systems   Review of Systems  Physical Exam Updated Vital Signs BP 123/77   Pulse 95   Temp 98 F (36.7 C)   Resp 17   Ht '5\' 8"'$  (1.727 m)   Wt 109.3 kg   SpO2 99%   BMI 36.64 kg/m  Physical Exam Vitals and nursing note reviewed.  Constitutional:      Appearance: He is well-developed.  HENT:     Head: Normocephalic and atraumatic.  Eyes:     Pupils: Pupils are equal, round, and reactive to light.  Neck:     Vascular: No JVD.  Cardiovascular:     Rate and Rhythm: Normal rate and regular rhythm.     Heart sounds: No murmur heard.    No friction rub. No gallop.  Pulmonary:     Effort: No respiratory distress.     Breath sounds: No wheezing.  Abdominal:     General: There is no distension.     Tenderness: There is no abdominal tenderness. There is no guarding or rebound.  Musculoskeletal:        General: Normal range of motion.     Cervical back: Normal range of motion and neck supple.  Skin:    Coloration: Skin is not pale.     Findings: No rash.  Neurological:     Mental Status: He is alert.     Comments: Confused  Psychiatric:        Behavior: Behavior normal.     ED Results / Procedures / Treatments   Labs (all labs ordered are listed, but only abnormal results are displayed) Labs Reviewed  CBC WITH DIFFERENTIAL/PLATELET - Abnormal; Notable for the following components:      Result Value   RBC 3.48 (*)    Hemoglobin 11.5 (*)    HCT 35.2 (*)    MCV 101.1 (*)    All other components within normal limits  COMPREHENSIVE METABOLIC PANEL - Abnormal; Notable for the following components:   Potassium 5.5 (*)    Glucose, Bld 129 (*)    BUN 114 (*)    Creatinine, Ser 9.98 (*)    Calcium 8.3 (*)    Total Protein 5.8 (*)    Albumin 2.8 (*)    AST 8 (*)    GFR, Estimated 5 (*)    All other components within normal limits  CK - Abnormal; Notable for the following components:   Total CK 24 (*)    All other  components within normal limits  I-STAT CHEM 8, ED - Abnormal; Notable for the following components:   Potassium 5.3 (*)    BUN >130 (*)    Creatinine, Ser 10.50 (*)  Glucose, Bld 120 (*)    Calcium, Ion 1.07 (*)    Hemoglobin 11.6 (*)    HCT 34.0 (*)    All other components within normal limits    EKG EKG Interpretation  Date/Time:  Friday September 13 2021 22:48:03 EDT Ventricular Rate:  79 PR Interval:    QRS Duration: 118 QT Interval:  396 QTC Calculation: 893 R Axis:   66 Text Interpretation: Atrial fibrillation Nonspecific intraventricular conduction delay Low voltage, precordial leads No significant change since last tracing Confirmed by Deno Etienne [phone]) on 09/13/2021 11:04:41 PM  Radiology No results found.  Procedures Procedures    Medications Ordered in ED Medications  sodium chloride 0.9 % bolus 1,000 mL (1,000 mLs Intravenous New Bag/Given 09/13/21 2320)  sodium zirconium cyclosilicate (LOKELMA) packet 10 g (10 g Oral Given 09/14/21 0009)    ED Course/ Medical Decision Making/ A&P                           Medical Decision Making Amount and/or Complexity of Data Reviewed Labs: ordered.  Risk Prescription drug management.   71 yo M with a chief complaints of acute renal failure.  This was noted on blood work done today at his skilled nursing facility.  Patient has had some increased fatigue and confusion this week.  Decreased urine output today.  On exam the patient hallucinates at times.  No abdominal discomfort.  No palpable bladder.  Lab work was repeated here consistent with the lab work that he brought with him from his skilled nursing facility.  Uremia, creatinine went from 1-10.  Discussed with Dr. Joelyn Oms, nephrology.  We will follow along in the hospital.  Recommended hydration.  Will discuss with hospitalist for admission.  CRITICAL CARE Performed by: Cecilio Asper   Total critical care time: 35 minutes  Critical care time was  exclusive of separately billable procedures and treating other patients.  Critical care was necessary to treat or prevent imminent or life-threatening deterioration.  Critical care was time spent personally by me on the following activities: development of treatment plan with patient and/or surrogate as well as nursing, discussions with consultants, evaluation of patient's response to treatment, examination of patient, obtaining history from patient or surrogate, ordering and performing treatments and interventions, ordering and review of laboratory studies, ordering and review of radiographic studies, pulse oximetry and re-evaluation of patient's condition.  The patients results and plan were reviewed and discussed.   Any x-rays performed were independently reviewed by myself.   Differential diagnosis were considered with the presenting HPI.  Medications  sodium chloride 0.9 % bolus 1,000 mL (1,000 mLs Intravenous New Bag/Given 09/13/21 2320)  sodium zirconium cyclosilicate (LOKELMA) packet 10 g (10 g Oral Given 09/14/21 0009)    Vitals:   09/13/21 2300 09/13/21 2315 09/13/21 2330 09/13/21 2345  BP: 126/79 112/78 108/78 123/77  Pulse: 73 79 66 95  Resp: '18 15 15 17  '$ Temp:      SpO2: 100% 96% 96% 99%  Weight:      Height:        Final diagnoses:  Acute renal failure, unspecified acute renal failure type [facility])    Admission/ observation were discussed with the admitting physician, patient and/or family and they are comfortable with the plan.          Final Clinical Impression(s) / ED Diagnoses Final diagnoses:  Acute renal failure, unspecified acute renal failure type ([place])    Rx /  DC Orders ED Discharge Orders     None         Deno Etienne, [place] 09/14/21 0459

## 2021-09-14 NOTE — Assessment & Plan Note (Signed)
Secondary to uremia from acute renal failure in the setting of advanced Parkinson. Tx as above.

## 2021-09-15 ENCOUNTER — Inpatient Hospital Stay (HOSPITAL_COMMUNITY): Payer: Medicare Other

## 2021-09-15 DIAGNOSIS — N179 Acute kidney failure, unspecified: Secondary | ICD-10-CM | POA: Diagnosis not present

## 2021-09-15 DIAGNOSIS — E875 Hyperkalemia: Secondary | ICD-10-CM | POA: Diagnosis not present

## 2021-09-15 DIAGNOSIS — G9341 Metabolic encephalopathy: Secondary | ICD-10-CM | POA: Diagnosis not present

## 2021-09-15 LAB — COMPREHENSIVE METABOLIC PANEL
ALT: <5 U/L (ref 0–44)
AST: 9 U/L — ABNORMAL LOW (ref 15–41)
Albumin: 3.1 g/dL — ABNORMAL LOW (ref 3.5–5.0)
Alkaline Phosphatase: 84 U/L (ref 38–126)
Anion gap: 16 — ABNORMAL HIGH (ref 5–15)
BUN: 109 mg/dL — ABNORMAL HIGH (ref 8–23)
CO2: 20 mmol/L — ABNORMAL LOW (ref 22–32)
Calcium: 8.5 mg/dL — ABNORMAL LOW (ref 8.9–10.3)
Chloride: 105 mmol/L (ref 98–111)
Creatinine, Ser: 10.73 mg/dL — ABNORMAL HIGH (ref 0.61–1.24)
GFR, Estimated: 5 mL/min — ABNORMAL LOW (ref >60)
Glucose, Bld: 87 mg/dL (ref 70–99)
Potassium: 5.4 mmol/L — ABNORMAL HIGH (ref 3.5–5.1)
Sodium: 141 mmol/L (ref 135–145)
Total Bilirubin: 0.9 mg/dL (ref 0.3–1.2)
Total Protein: 6.2 g/dL — ABNORMAL LOW (ref 6.5–8.1)

## 2021-09-15 LAB — CBC WITH DIFFERENTIAL/PLATELET
Abs Immature Granulocytes: 0.04 10*3/uL (ref 0.00–0.07)
Basophils Absolute: 0 10*3/uL (ref 0.0–0.1)
Basophils Relative: 1 %
Eosinophils Absolute: 0.3 10*3/uL (ref 0.0–0.5)
Eosinophils Relative: 4 %
HCT: 36.8 % — ABNORMAL LOW (ref 39.0–52.0)
Hemoglobin: 12.2 g/dL — ABNORMAL LOW (ref 13.0–17.0)
Immature Granulocytes: 1 %
Lymphocytes Relative: 8 %
Lymphs Abs: 0.7 10*3/uL (ref 0.7–4.0)
MCH: 33 pg (ref 26.0–34.0)
MCHC: 33.2 g/dL (ref 30.0–36.0)
MCV: 99.5 fL (ref 80.0–100.0)
Monocytes Absolute: 0.4 10*3/uL (ref 0.1–1.0)
Monocytes Relative: 5 %
Neutro Abs: 7 10*3/uL (ref 1.7–7.7)
Neutrophils Relative %: 81 %
Platelets: 223 10*3/uL (ref 150–400)
RBC: 3.7 MIL/uL — ABNORMAL LOW (ref 4.22–5.81)
RDW: 12.5 % (ref 11.5–15.5)
WBC: 8.5 10*3/uL (ref 4.0–10.5)
nRBC: 0 % (ref 0.0–0.2)

## 2021-09-15 LAB — HEPATITIS C ANTIBODY: HCV Ab: NONREACTIVE

## 2021-09-15 LAB — HEPATITIS B CORE ANTIBODY, TOTAL: Hep B Core Total Ab: NONREACTIVE

## 2021-09-15 LAB — PROTIME-INR
INR: 1.5 — ABNORMAL HIGH (ref 0.8–1.2)
Prothrombin Time: 17.9 seconds — ABNORMAL HIGH (ref 11.4–15.2)

## 2021-09-15 LAB — HEPATITIS B SURFACE ANTIBODY,QUALITATIVE: Hep B S Ab: NONREACTIVE

## 2021-09-15 LAB — MAGNESIUM: Magnesium: 3.4 mg/dL — ABNORMAL HIGH (ref 1.7–2.4)

## 2021-09-15 LAB — HEPATITIS B SURFACE ANTIGEN: Hepatitis B Surface Ag: NONREACTIVE

## 2021-09-15 LAB — PHOSPHORUS: Phosphorus: 6.6 mg/dL — ABNORMAL HIGH (ref 2.5–4.6)

## 2021-09-15 MED ORDER — SODIUM ZIRCONIUM CYCLOSILICATE 10 G PO PACK
10.0000 g | PACK | Freq: Every day | ORAL | Status: DC
  Administered 2021-09-15 – 2021-09-16 (×2): 10 g via ORAL
  Filled 2021-09-15 (×2): qty 1

## 2021-09-15 MED ORDER — APIXABAN 5 MG PO TABS
5.0000 mg | ORAL_TABLET | Freq: Two times a day (BID) | ORAL | Status: DC
  Administered 2021-09-15 – 2021-09-20 (×11): 5 mg via ORAL
  Filled 2021-09-15 (×11): qty 1

## 2021-09-15 NOTE — Progress Notes (Signed)
PCCM Progress Note  Notified by Dr. Joelyn Oms, nephrology, patient is in need of hemodialysis access.  Patient meets criteria for urgent dialysis given signs of uremia.  Per policy we are unable to perform hemodialysis catheter insertion on the floor and as it is the weekend we are unable to utilize Endo.  Therefore patient will need to be transferred to ICU for catheter placement.  Patient placement and [facility] notified of need of ICU bed.  Currently there are no available ICU beds to transfer into.  Per [facility] ICU nurse is in process of being called in and will hopefully be in house between 9 and 10 AM.  Once RN arrives patient will be transferred to ICU and central access will be obtained.  Transfer order placed.  Derenda Giddings D. Kenton Kingfisher, NP-C  Pulmonary & Critical Care Personal contact information can be found on Amion  09/15/2021, 8:16 AM

## 2021-09-15 NOTE — Progress Notes (Signed)
TX INITIATED W/O DIFFICULTY VIA LDC WNL UFG 1L, PT GIVEN INFORMED CONSENT PRIOR TO TREATMENT INITIATION NO CONCERNS VOICED CONNECTED A-A V-V BFR ACHIEVED LINES SECURED AND INTACT

## 2021-09-15 NOTE — Progress Notes (Signed)
PROGRESS NOTE    Isaiah Pena  TZG:[id number] DOB: 16-Jan-1951 DOA: 09/13/2021 PCP: Mayra Neer, MD    Chief Complaint  Patient presents with   Abnormal Labs    Brief Narrative: Isaiah Pena is a 71 y.o. male with medical history significant of  Parkinson's disease, chronic urinary retention with indwelling foley, persistent A.fib on Xarelto, CAD s/p PCI, HTN, HLD who presents with increasing lethargy and confusion and found to have acute renal failure.    Assessment & Plan:   Principal Problem:   Acute renal failure (ARF) (HCC) Active Problems:   Persistent atrial fibrillation (HCC)   Parkinson disease ([place])   Hypertension   Coronary artery disease s/p PCI to RCA    Obesity (BMI 30-39.9)   Acute metabolic encephalopathy   Hyperkalemia   Uremia   Hypoxia   AKI is probably secondary to ATN from contrast patient is also uremic admitted with acute metabolic encephalopathy. Nephrology on board and appreciate recommendations. Plan for short-term HD today.  PCCM consulted for HD catheter placement. Urinalysis with hematuria.  Renal ultrasound showed decompressed bladder with mild left hydronephrosis.  Patient had CT abdomen and pelvis on 8/14 with contrast exposure.     Acute metabolic encephalopathy probably secondary to uremia from AKI. Continue to monitor patient is alert and oriented to person only at this time.    History of Parkinson's disease Continue with carbidopa and levodopa.   Hypertension:  BP parameters are optimal.     Hyperlipidemia:    Body mass index is 42.3 kg/m. Obesity.  Poor prognostic factor.    Permanent atrial fibrillation Rate controlled on Xarelto for anticoagulation which is being held for now. Will transition to eliquis due to AKI.    Mild hyperkalemia HD today.     Chronic urinary retention with indwelling Foley catheter placement.    Coronary artery disease s/p PCI No chest pain at this  time.     DVT prophylaxis: eliquis.  Code Status: DNR  Family Communication: family at bedside.  Disposition:   Status is: Inpatient Remains inpatient appropriate because:    Level of care: Telemetry Medical Consultants:  PCCM NEPHROLOGY.   Procedures: HD cath placement on 09/15/21  Antimicrobials: (none.    Subjective: Still confused, denies any chest pain or sob. Continues to have some jerking of the hand.  Objective: Vitals:   09/15/21 1300 09/15/21 1330 09/15/21 1445 09/15/21 1450  BP:   (!) 129/97 (!) (P) 143/100  Pulse: (!) 111 (!) 103 (!) 113 (!) (P) 109  Resp: '16 20 15 '$ (P) 16  Temp: 98.5 F (36.9 C)  98.2 F (36.8 C)   TempSrc: Oral     SpO2: 100% 99% 100% (P) 100%  Weight:   126.2 kg   Height:        Intake/Output Summary (Last 24 hours) at 09/15/2021 1512 Last data filed at 09/15/2021 1400 Gross per 24 hour  Intake 100 ml  Output 1425 ml  Net -1325 ml   Filed Weights   09/13/21 2246 09/14/21 1655 09/15/21 1445  Weight: 109.3 kg 120.8 kg 126.2 kg    Examination:  General exam: Appears calm and comfortable  Respiratory system: Clear to auscultation. Respiratory effort normal. Cardiovascular system: S1 & S2 heard, RRR. No pedal edema. Gastrointestinal system: Abdomen is nondistended, soft and nontender. Normal bowel sounds heard. Central nervous system: Alert and oriented to person.  Extremities: Symmetric 5 x 5 power. Skin: No rashes, Psychiatry: calm.      Data  Reviewed: I have personally reviewed following labs and imaging studies  CBC: Recent Labs  Lab 09/13/21 2313 09/13/21 2343 09/14/21 0256 09/15/21 0052  WBC 7.4  --  7.7 8.5  NEUTROABS 5.5  --   --  7.0  HGB 11.5* 11.6* 11.2* 12.2*  HCT 35.2* 34.0* 33.8* 36.8*  MCV 101.1*  --  100.0 99.5  PLT 244  --  232 381    Basic Metabolic Panel: Recent Labs  Lab 09/13/21 2313 09/13/21 2343 09/14/21 0256 09/14/21 1927 09/15/21 0052  NA 140 140 138 139  138 141  K 5.5* 5.3*  4.8 5.2*  5.1 5.4*  CL 106 105 106 107  105 105  CO2 26  --  23 19*  20* 20*  GLUCOSE 129* 120* 109* 95  96 87  BUN 114* >130* 111* 110*  109* 109*  CREATININE 9.98* 10.50* 9.82* 10.70*  10.48* 10.73*  CALCIUM 8.3*  --  8.0* 8.3*  8.2* 8.5*  MG  --   --   --   --  3.4*  PHOS  --   --   --  6.4* 6.6*    GFR: Estimated Creatinine Clearance: 8.3 mL/min (A) (by C-G formula based on SCr of 10.73 mg/dL (H)).  Liver Function Tests: Recent Labs  Lab 09/13/21 2313 09/14/21 1927 09/15/21 0052  AST 8*  --  9*  ALT <5  --  <5  ALKPHOS 78  --  84  BILITOT 0.5  --  0.9  PROT 5.8*  --  6.2*  ALBUMIN 2.8* 2.9* 3.1*    CBG: No results for input(s): "GLUCAP" in the last 168 hours.   No results found for this or any previous visit (from the past 240 hour(s)).       Radiology Studies: DG CHEST PORT 1 VIEW  Result Date: 09/15/2021 CLINICAL DATA:  Central line placement. EXAM: PORTABLE CHEST 1 VIEW COMPARISON:  One-view chest x-ray 09/14/2021 FINDINGS: The heart is enlarged. Mild pulmonary vascular congestion is stable. No focal airspace disease is present. No pleural effusion is present. A new left IJ line is in place. The tip is at the confluence of the innominate vein and SVC. No pneumothorax is present. IMPRESSION: 1. Interval placement of left IJ line without radiographic evidence for complication. 2. Stable cardiomegaly and mild pulmonary vascular congestion. Electronically Signed   By: San Morelle M.D.   On: 09/15/2021 13:38   DG Abd 2 Views  Result Date: 09/14/2021 CLINICAL DATA:  Abdominal distention EXAM: ABDOMEN - 2 VIEW COMPARISON:  05/21/2021 FINDINGS: Examination is technically less than optimal. There is no significant small bowel dilation. There is moderate distention of colon. Stomach is not distended. IMPRESSION: Technically limited study.  Nonspecific bowel gas pattern. Electronically Signed   By: Elmer Picker M.D.   On: 09/14/2021 15:38   US  RENAL  Result Date: 09/14/2021 CLINICAL DATA:  Acute renal failure EXAM: RENAL / URINARY TRACT ULTRASOUND COMPLETE COMPARISON:  Abdominal CT from 12 days ago FINDINGS: Right Kidney: Renal volume appears overestimated. 13 cm length. Mild hydronephrosis. Cystic change in scarring at the lower pole by CT is not well demonstrated sonographically. Left Kidney: Renally of 12 cm.  No hydronephrosis or mass. Bladder: Not visible.  Foley catheter is reportedly present. IMPRESSION: 1. Mild hydronephrosis of the polycystic right kidney. 2. Decompressed bladder. Electronically Signed   By: Jorje Guild M.D.   On: 09/14/2021 04:50   DG CHEST PORT 1 VIEW  Result Date: 09/14/2021 CLINICAL DATA:  Hypoxia EXAM: PORTABLE CHEST 1 VIEW COMPARISON:  09/02/2021 FINDINGS: Mild left basilar atelectasis. Lungs are otherwise clear. No pneumothorax or pleural effusion. Cardiac size is mildly enlarged. Pulmonary vascularity is normal. No acute bone abnormality. IMPRESSION: Mild left basilar atelectasis. Stable cardiomegaly Electronically Signed   By: Fidela Salisbury M.D.   On: 09/14/2021 03:00        Scheduled Meds:  carbidopa-levodopa  3 tablet Oral 3 times per day   Chlorhexidine Gluconate Cloth  6 each Topical Daily   entacapone  200 mg Oral 3 times per day   sodium zirconium cyclosilicate  10 g Oral Daily   Continuous Infusions:   LOS: 1 day    Time spent: 45 minutes.     Hosie Poisson, MD [facility]   To contact the attending provider between 7A-7P or the covering provider during after hours 7P-7A, please log into the web site [email or web address] and access using universal [place] password for that web site. If you do not have the password, please call the hospital operator.  09/15/2021, 3:12 PM

## 2021-09-15 NOTE — Progress Notes (Signed)
Admit: 09/13/2021 LOS: 1  67M with AKI, uremic, likley ATN from contrast  Subjective:  Remains with myoclonus, confusion, malaise SCr w/I improvement given IVFs, BUN 110 K 5.4 D/w partner and son, plan for HD for now just short term, hopeful for recovery of GFR with support/time D/w CCM and appreciate their assistance    08/26 0701 - 08/27 0700 In: -  Out: 1125 [Urine:1125]  Filed Weights   09/13/21 2246 09/14/21 1655  Weight: 109.3 kg 120.8 kg    Scheduled Meds:  carbidopa-levodopa  3 tablet Oral 3 times per day   Chlorhexidine Gluconate Cloth  6 each Topical Daily   entacapone  200 mg Oral 3 times per day   sodium zirconium cyclosilicate  10 g Oral Daily   Continuous Infusions: PRN Meds:.  Current Labs: reviewed    Physical Exam:  Blood pressure (!) 156/97, pulse 94, temperature 98.6 F (37 C), temperature source Oral, resp. rate 15, height '5\' 8"'$  (1.727 m), weight 120.8 kg, SpO2 99 %. GEN: Chronically ill-appearing, awake, conversant ENT: NCAT EYES: Glasses CV: Regular, normal S1 and S2 PULM: CTA B ABD: Soft, nontender, adipose, no suprapubic fullness or tenderness SKIN: Hyperpigmentation consistent with chronic venous stasis noted in the bilateral lower extremities EXT: No peripheral edema NEURO: Myoclonus noted, awake, alert, interactive  A AKI: ATN from contrast exposure 8/14? CT abdomen pelvis 8/14 with contrast exposure Renal ultrasound here with decompressed bladder, mild left hydronephrosis, previous mention of bilateral prominent extrarenal pelvises noted. Urine analysis with hematuria, not much pyuria For HD #1 today, needs Temp HD cath first, then Tx Uremia, myoclonus, cognitive problems Parkinson's disease, resides in SNF; marginal long term candidate for HD A-fib on Xarelto Mild hyperkalemia Chronic urinary retention with indwelling Foley catheter CAD with history of PCI  P As above Daily weights, Daily Renal Panel, Strict I/Os, Avoid  nephrotoxins (NSAIDs, judicious IV Contrast)  Medication Issues; Preferred narcotic agents for pain control are hydromorphone, fentanyl, and methadone. Morphine should not be used.  Baclofen should be avoided Avoid oral sodium phosphate and magnesium citrate based laxatives / bowel preps   Pearson Grippe MD 09/15/2021, 9:48 AM  Recent Labs  Lab 09/14/21 0256 09/14/21 1927 09/15/21 0052  NA 138 139  138 141  K 4.8 5.2*  5.1 5.4*  CL 106 107  105 105  CO2 23 19*  20* 20*  GLUCOSE 109* 95  96 87  BUN 111* 110*  109* 109*  CREATININE 9.82* 10.70*  10.48* 10.73*  CALCIUM 8.0* 8.3*  8.2* 8.5*  PHOS  --  6.4* 6.6*   Recent Labs  Lab 09/13/21 2313 09/13/21 2343 09/14/21 0256 09/15/21 0052  WBC 7.4  --  7.7 8.5  NEUTROABS 5.5  --   --  7.0  HGB 11.5* 11.6* 11.2* 12.2*  HCT 35.2* 34.0* 33.8* 36.8*  MCV 101.1*  --  100.0 99.5  PLT 244  --  232 223

## 2021-09-15 NOTE — Progress Notes (Signed)
Received patient in bed to unit.  Alert and oriented.  Informed consent signed and in chart.   Treatment initiated: 1455  Treatment completed: 1655  Patient tolerated well.  Transported back to the room  Alert, without acute distress.  Hand-off given to patient's nurse.   Access used: [facility] Access issues: none  Total UF removed: 1L Medication(s) given: none Post HD VS: see flowsheet Post HD weight: 114.9kg   [facility]

## 2021-09-15 NOTE — Progress Notes (Signed)
PT IN BED STABLE FOR HD VIA LDC NO C/OS UFG 1L

## 2021-09-15 NOTE — Progress Notes (Signed)
ANTICOAGULATION CONSULT NOTE  Pharmacy Consult for apixaban Indication: atrial fibrillation  Allergies  Allergen Reactions   Sulfa Antibiotics Hives    Patient Measurements: Height: '5\' 8"'$  (172.7 cm) Weight: 114.9 kg (253 lb 4.9 oz) IBW/kg (Calculated) : 68.4  Vital Signs: Temp: 98.9 F (37.2 C) (08/27 1730) Temp Source: Oral (08/27 1730) BP: 134/98 (08/27 1730) Pulse Rate: 117 (08/27 1730)  Labs: Recent Labs    09/13/21 2313 09/13/21 2343 09/14/21 0256 09/14/21 1927 09/15/21 0052 09/15/21 0812  HGB 11.5* 11.6* 11.2*  --  12.2*  --   HCT 35.2* 34.0* 33.8*  --  36.8*  --   PLT 244  --  232  --  223  --   LABPROT  --   --   --   --   --  17.9*  INR  --   --   --   --   --  1.5*  CREATININE 9.98* 10.50* 9.82* 10.70*  10.48* 10.73*  --   CKTOTAL 24*  --   --   --   --   --     Estimated Creatinine Clearance: 7.9 mL/min (A) (by C-G formula based on SCr of 10.73 mg/dL (H)).   Medical History: Past Medical History:  Diagnosis Date   Anticoagulant long-term use    xarelto   Bilateral lower extremity edema    BPH (benign prostatic hyperplasia)    BPH (benign prostatic hyperplasia)    Central serous retinopathy    LOSS OF CENTER VISION LEFT EYE W/ BLURRED VISION-  was treated  methotrexate by specialist at [facility] and released   Coronary artery disease cardiologist-  dr Tressia Miners turner   01/ 2004  abnormal cardiolite  s/p  cardiac cath w/ PCI and DES to dRCA   Edema of extremities    CHRONIC    Erectile dysfunction    GERD (gastroesophageal reflux disease)    History of colon polyps    2011 per pt benign   History of kidney stones    History of simple renal cyst    drained via radiology   History of torn meniscus of right knee    Hyperlipidemia    Hypertension    Kidney stone    HX OF STONE-PASSED    Lower urinary tract symptoms (LUTS)    OSA (obstructive sleep apnea) 09/29/2011   NPSG 2004:  AHI 25/hr  Rx with bilevel.      OSA treated with BiPAP    moderate  per study 01-23-2002   Parkinson's disease [facility])    neurologist-  dr ed hill ([facility])   Permanent atrial fibrillation [facility])    Persistent atrial fibrillation ([place]) 11/08/2012   S/P drug eluting coronary stent placement    01/ 2004  x1 to dRCA    Assessment: 89 yoM on rivaroxaban PTA for AFib admitted with renal failure. Pharmacy consulted to start anticoagulation with apixaban. Last rivaroxaban dose 8/25.   Plan:  -Apixaban '5mg'$  BID -Pharmacy will sign off, reconsult if needed  Arrie Senate, PharmD, BCPS, [facility] Clinical Pharmacist [phone] Please check AMION for all [place] numbers 09/15/2021

## 2021-09-15 NOTE — Procedures (Signed)
Central Venous Catheter Insertion Procedure Note  Isaiah Pena  [id number]  09-Jul-1950  Date:09/15/21  Time:1:30 PM   Provider Performing:Gussie Murton Jerilynn Mages Dewaine Oats   Procedure: Insertion of Non-tunneled Central Venous Catheter(36556)with US guidance (94585)    Indication(s) Hemodialysis  Consent Risks of the procedure as well as the alternatives and risks of each were explained to the patient and/or caregiver.  Consent for the procedure was obtained and is signed in the bedside chart  Anesthesia Topical only with 1% lidocaine   Timeout Verified patient identification, verified procedure, site/side was marked, verified correct patient position, special equipment/implants available, medications/allergies/relevant history reviewed, required imaging and test results available.  Sterile Technique Maximal sterile technique including full sterile barrier drape, hand hygiene, sterile gown, sterile gloves, mask, hair covering, sterile ultrasound probe cover (if used).  Procedure Description Area of catheter insertion was cleaned with chlorhexidine and draped in sterile fashion.   With real-time ultrasound guidance a HD catheter was placed into the left internal jugular vein.  Nonpulsatile blood flow and easy flushing noted in all ports.  The catheter was sutured in place and sterile dressing applied.  Complications/Tolerance None; patient tolerated the procedure well. Chest X-ray is ordered to verify placement for internal jugular or subclavian cannulation.  Chest x-ray is not ordered for femoral cannulation.  EBL Minimal  Specimen(s) None

## 2021-09-16 DIAGNOSIS — E875 Hyperkalemia: Secondary | ICD-10-CM | POA: Diagnosis not present

## 2021-09-16 DIAGNOSIS — N179 Acute kidney failure, unspecified: Secondary | ICD-10-CM | POA: Diagnosis not present

## 2021-09-16 DIAGNOSIS — G9341 Metabolic encephalopathy: Secondary | ICD-10-CM | POA: Diagnosis not present

## 2021-09-16 LAB — CBC WITH DIFFERENTIAL/PLATELET
Abs Immature Granulocytes: 0.04 10*3/uL (ref 0.00–0.07)
Basophils Absolute: 0 10*3/uL (ref 0.0–0.1)
Basophils Relative: 0 %
Eosinophils Absolute: 0.1 10*3/uL (ref 0.0–0.5)
Eosinophils Relative: 1 %
HCT: 34.7 % — ABNORMAL LOW (ref 39.0–52.0)
Hemoglobin: 11.5 g/dL — ABNORMAL LOW (ref 13.0–17.0)
Immature Granulocytes: 1 %
Lymphocytes Relative: 6 %
Lymphs Abs: 0.5 10*3/uL — ABNORMAL LOW (ref 0.7–4.0)
MCH: 32.8 pg (ref 26.0–34.0)
MCHC: 33.1 g/dL (ref 30.0–36.0)
MCV: 98.9 fL (ref 80.0–100.0)
Monocytes Absolute: 0.5 10*3/uL (ref 0.1–1.0)
Monocytes Relative: 6 %
Neutro Abs: 7.4 10*3/uL (ref 1.7–7.7)
Neutrophils Relative %: 86 %
Platelets: 211 10*3/uL (ref 150–400)
RBC: 3.51 MIL/uL — ABNORMAL LOW (ref 4.22–5.81)
RDW: 12.7 % (ref 11.5–15.5)
WBC: 8.5 10*3/uL (ref 4.0–10.5)
nRBC: 0 % (ref 0.0–0.2)

## 2021-09-16 LAB — URINE CULTURE: Culture: NO GROWTH

## 2021-09-16 LAB — BASIC METABOLIC PANEL
Anion gap: 12 (ref 5–15)
BUN: 80 mg/dL — ABNORMAL HIGH (ref 8–23)
CO2: 19 mmol/L — ABNORMAL LOW (ref 22–32)
Calcium: 8 mg/dL — ABNORMAL LOW (ref 8.9–10.3)
Chloride: 106 mmol/L (ref 98–111)
Creatinine, Ser: 8.37 mg/dL — ABNORMAL HIGH (ref 0.61–1.24)
GFR, Estimated: 6 mL/min — ABNORMAL LOW (ref >60)
Glucose, Bld: 101 mg/dL — ABNORMAL HIGH (ref 70–99)
Potassium: 4.2 mmol/L (ref 3.5–5.1)
Sodium: 137 mmol/L (ref 135–145)

## 2021-09-16 MED ORDER — ESCITALOPRAM OXALATE 10 MG PO TABS
5.0000 mg | ORAL_TABLET | Freq: Every day | ORAL | Status: DC
  Administered 2021-09-16 – 2021-09-20 (×5): 5 mg via ORAL
  Filled 2021-09-16 (×5): qty 1

## 2021-09-16 MED ORDER — PANTOPRAZOLE SODIUM 40 MG PO TBEC
40.0000 mg | DELAYED_RELEASE_TABLET | Freq: Every day | ORAL | Status: DC
  Administered 2021-09-16 – 2021-09-20 (×5): 40 mg via ORAL
  Filled 2021-09-16 (×5): qty 1

## 2021-09-16 MED ORDER — CHLORHEXIDINE GLUCONATE CLOTH 2 % EX PADS
6.0000 | MEDICATED_PAD | Freq: Every day | CUTANEOUS | Status: DC
  Administered 2021-09-17 – 2021-09-19 (×3): 6 via TOPICAL

## 2021-09-16 MED ORDER — AMANTADINE HCL 100 MG PO CAPS
100.0000 mg | ORAL_CAPSULE | Freq: Every day | ORAL | Status: DC
  Administered 2021-09-16 – 2021-09-17 (×2): 100 mg via ORAL
  Filled 2021-09-16 (×2): qty 1

## 2021-09-16 MED ORDER — FINASTERIDE 5 MG PO TABS
5.0000 mg | ORAL_TABLET | Freq: Every day | ORAL | Status: DC
  Administered 2021-09-16 – 2021-09-20 (×5): 5 mg via ORAL
  Filled 2021-09-16 (×5): qty 1

## 2021-09-16 NOTE — Progress Notes (Signed)
[facility] NEPHROLOGY PROGRESS NOTE  Assessment/ Plan: Pt is a 71 y.o. yo male with past medical history of Parkinson disease, chronic urine retention, A-fib, CAD status post stent, hypertension, HLD, admitted with increasing confusion, lethargic and AKI.  #Acute kidney injury, nonoliguric presumably ATN from IV contrast.  The patient had CT abdomen pelvis with contrast on 8/14, renal ultrasound with decompressed bladder, mild left hydro.  Received first HD on 8/27 for uremia and myoclonic shock.  It seems like clinically better as per his wife.  We will plan for second HD today mainly for uremia and clearance. Strict ins and outs and daily lab monitoring for renal recovery.  #Myoclonic jerk and acute metabolic/uremic encephalopathy: CT head with mild cerebral atrophy with no acute finding.  We will continue dialysis t for uremia.  #Chronic urine retention with indwelling Foley catheter:  #Metabolic acidosis: Managed with dialysis.   # HTN/volume: Not on antihypertensive, blood pressure acceptable.  Poorly managed with dialysis now.  #Hyperkalemia: Discontinue Lokelma.  Managing with dialysis.  Subjective: I have seen and examined the patient at the bedside.  His wife was present there.  As per her, the patient looks more alert and awake and the jerk seems to be somewhat better.  The patient was alert but still very lethargic.  Urine output is around 600 cc. Objective Vital signs in last 24 hours: Vitals:   09/15/21 2002 09/16/21 0519 09/16/21 0844 09/16/21 0917  BP: (!) 114/103 (!) 134/93 125/87 133/89  Pulse: (!) 108 92 (!) 101 (!) 108  Resp: '18 20 17 17  '$ Temp: 98.6 F (37 C) 98.3 F (36.8 C) 97.9 F (36.6 C) 98 F (36.7 C)  TempSrc: Oral Oral Oral Oral  SpO2: 100% 99% 99% 99%  Weight:      Height:       Weight change: 5.4 kg  Intake/Output Summary (Last 24 hours) at 09/16/2021 1039 Last data filed at 09/16/2021 0700 Gross per 24 hour  Intake 120 ml  Output  1600 ml  Net -1480 ml       Labs: RENAL PANEL Recent Labs    05/21/21 1203 05/22/21 0150 05/22/21 0213 05/23/21 0235 05/23/21 0240 05/25/21 0200 05/26/21 0544 05/27/21 4332 05/30/21 9518 06/03/21 8416 06/04/21 6063 06/12/21 1405 06/13/21 0305 06/14/21 0427 06/16/21 0160 07/11/21 1812 07/20/21 1912 09/02/21 2131 09/13/21 2313 09/13/21 2343 09/14/21 0256 09/14/21 1927 09/15/21 0052 09/16/21 0143  NA 140  --    < >  --    < > 142  --  143 142 139   < > 139 136  --  142 143 144 143 140 140 138 139  138 141 137  K 3.7  --    < >  --    < > 3.4*  --  3.3* 3.8 3.2*   < > 4.3 3.2*   < > 3.1* 3.5 3.7 3.4* 5.5* 5.3* 4.8 5.2*  5.1 5.4* 4.2  CL 106  --    < >  --    < > 106  --  104 108 104   < > 106 104  --  104 105 107 111 106 105 106 107  105 105 106  CO2 25  --    < >  --    < > 26  --  32 28 28   < > 25 27  --  '31 28 30 23 26  '$ --  23 19*  20* 20* 19*  GLUCOSE 129*  --    < >  --    < >  123*  --  122* 111* 122*   < > 136* 119*  --  156* 133* 132* 126* 129* 120* 109* 95  96 87 101*  BUN 57*  --    < >  --    < > 17  --  '15 16 16   '$ < > 10 7*  --  '17 13 16 23 '$ 114* >130* 111* 110*  109* 109* 80*  CREATININE 4.70*  --    < >  --    < > 0.71  --  0.73 0.82 0.85   < > 0.73 0.65  --  0.93 0.97 1.04 1.04 9.98* 10.50* 9.82* 10.70*  10.48* 10.73* 8.37*  CALCIUM 8.7*  --    < >  --    < > 8.3*  --  8.5* 8.7* 8.6*   < > 8.7* 8.1*  --  8.6* 8.7* 9.0 8.9 8.3*  --  8.0* 8.3*  8.2* 8.5* 8.0*  MG  --  2.0  --  1.5*  --  1.5* 1.6* 1.4* 1.8 1.7  --   --   --   --  1.9  --   --   --   --   --   --   --  3.4*  --   PHOS  --  3.7  --   --   --   --   --   --   --   --   --   --   --   --   --   --   --   --   --   --   --  6.4* 6.6*  --   ALBUMIN 3.5  --   --   --   --   --   --  3.0*  --   --   --  3.2*  --   --   --  3.6  --  3.4* 2.8*  --   --  2.9* 3.1*  --    < > = values in this interval not displayed.     Liver Function Tests: Recent Labs  Lab 09/13/21 2313 09/14/21 1927  09/15/21 0052  AST 8*  --  9*  ALT <5  --  <5  ALKPHOS 78  --  84  BILITOT 0.5  --  0.9  PROT 5.8*  --  6.2*  ALBUMIN 2.8* 2.9* 3.1*   No results for input(s): "LIPASE", "AMYLASE" in the last 168 hours. No results for input(s): "AMMONIA" in the last 168 hours. CBC: Recent Labs    09/13/21 2313 09/13/21 2343 09/14/21 0256 09/15/21 0052 09/16/21 0143  HGB 11.5* 11.6* 11.2* 12.2* 11.5*  MCV 101.1*  --  100.0 99.5 98.9    Cardiac Enzymes: Recent Labs  Lab 09/13/21 2313  CKTOTAL 24*   CBG: No results for input(s): "GLUCAP" in the last 168 hours.  Iron Studies: No results for input(s): "IRON", "TIBC", "TRANSFERRIN", "FERRITIN" in the last 72 hours. Studies/Results: DG CHEST PORT 1 VIEW  Result Date: 09/15/2021 CLINICAL DATA:  Central line placement. EXAM: PORTABLE CHEST 1 VIEW COMPARISON:  One-view chest x-ray 09/14/2021 FINDINGS: The heart is enlarged. Mild pulmonary vascular congestion is stable. No focal airspace disease is present. No pleural effusion is present. A new left IJ line is in place. The tip is at the confluence of the innominate vein and SVC. No pneumothorax is present. IMPRESSION: 1. Interval placement of left IJ line without radiographic evidence for complication.  2. Stable cardiomegaly and mild pulmonary vascular congestion. Electronically Signed   By: San Morelle M.D.   On: 09/15/2021 13:38   DG Abd 2 Views  Result Date: 09/14/2021 CLINICAL DATA:  Abdominal distention EXAM: ABDOMEN - 2 VIEW COMPARISON:  05/21/2021 FINDINGS: Examination is technically less than optimal. There is no significant small bowel dilation. There is moderate distention of colon. Stomach is not distended. IMPRESSION: Technically limited study.  Nonspecific bowel gas pattern. Electronically Signed   By: Elmer Picker M.D.   On: 09/14/2021 15:38    Medications: Infusions:   Scheduled Medications:  apixaban  5 mg Oral BID   carbidopa-levodopa  3 tablet Oral 3 times per  day   Chlorhexidine Gluconate Cloth  6 each Topical Daily   entacapone  200 mg Oral 3 times per day   sodium zirconium cyclosilicate  10 g Oral Daily    have reviewed scheduled and prn medications.  Physical Exam: General: Lethargic male, opens eyes with the name Heart:RRR, s1s2 nl Lungs:clear b/l, no crackle Abdomen:soft, Non-tender, non-distended Extremities: Trace dependent edema present Dialysis Access: Left IJ temporary HD catheter placed by PCCM on 8/27, site clean  Elba Dendinger [employer] 09/16/2021,10:39 AM  LOS: 2 days

## 2021-09-16 NOTE — Progress Notes (Signed)
PROGRESS NOTE    BRIGG CAPE  ZOX:[id number] DOB: March 19, 1950 DOA: 09/13/2021 PCP: Mayra Neer, MD    Chief Complaint  Patient presents with   Abnormal Labs    Brief Narrative: Isaiah Pena is a 71 y.o. male with medical history significant of  Parkinson's disease, chronic urinary retention with indwelling foley, persistent A.fib on Xarelto, CAD s/p PCI, HTN, HLD who presents with increasing lethargy and confusion and found to have acute renal failure.    Assessment & Plan:   Principal Problem:   Acute renal failure (ARF) (HCC) Active Problems:   Persistent atrial fibrillation (HCC)   Parkinson disease ([place])   Hypertension   Coronary artery disease s/p PCI to RCA    Obesity (BMI 30-39.9)   Acute metabolic encephalopathy   Hyperkalemia   Uremia   Hypoxia   AKI is probably secondary to ATN from contrast patient is also uremic admitted with acute metabolic encephalopathy. Nephrology on board and appreciate recommendations. Plan for short-term HD - yesterday and today. PCCM consulted for HD catheter placement. Urinalysis with hematuria. Renal ultrasound showed decompressed bladder with mild left hydronephrosis. Patient had CT abdomen and pelvis on 8/14 with contrast exposure.   Acute metabolic encephalopathy probably secondary to uremia from AKI. Continue to monitor .  Appears to have resolved. He is alert and oriented to place, person.  Restart Lexapro.    History of Parkinson's disease Continue with carbidopa and levodopa. Resume amantadine.    Hypertension:  BP parameters are optimal. Patient is on norvasc, which is on hold for now .  Restarted metoprolol.     Hyperlipidemia:  On statin , which is on hold.    Body mass index is 38.52 kg/m. Obesity.  Poor prognostic factor.    Permanent atrial fibrillation  rate elevated today.  Restart metoprolol 50 mg BID Transitioned to eliquis for renal function.    Chronic urinary retention with  indwelling Foley catheter placement. Urine cultures have been negative.    Coronary artery disease s/p PCI No chest pain at this time.          DVT prophylaxis: eliquis.  Code Status: DNR  Family Communication:none at bedside.  Disposition:   Status is: Inpatient Remains inpatient appropriate because:    Level of care: Telemetry Medical Consultants:  PCCM NEPHROLOGY.   Procedures: HD cath placement on 09/15/21  Antimicrobials: (none).    Subjective: His myoclonic jerks have improved.  He is more alert and answering all questions appropriately.   Objective: Vitals:   09/15/21 2002 09/16/21 0519 09/16/21 0844 09/16/21 0917  BP: (!) 114/103 (!) 134/93 125/87 133/89  Pulse: (!) 108 92 (!) 101 (!) 108  Resp: '18 20 17 17  '$ Temp: 98.6 F (37 C) 98.3 F (36.8 C) 97.9 F (36.6 C) 98 F (36.7 C)  TempSrc: Oral Oral Oral Oral  SpO2: 100% 99% 99% 99%  Weight:      Height:        Intake/Output Summary (Last 24 hours) at 09/16/2021 1420 Last data filed at 09/16/2021 0700 Gross per 24 hour  Intake 120 ml  Output 1300 ml  Net -1180 ml    Filed Weights   09/14/21 1655 09/15/21 1445 09/15/21 1700  Weight: 120.8 kg 126.2 kg 114.9 kg    Examination:  General exam: Appears calm and comfortable  Respiratory system: Clear to auscultation. Respiratory effort normal. Cardiovascular system: S1 & S2 heard, irregularly irregular,  No pedal edema. Gastrointestinal system: Abdomen is nondistended, soft and nontender.  Normal bowel sounds heard. Central nervous system: alert and oriented to place and person. Answering simple questions.  Extremities: no cyanosis.  Skin: No rashes seen Psychiatry: mood is okay.       Data Reviewed: I have personally reviewed following labs and imaging studies  CBC: Recent Labs  Lab 09/13/21 2313 09/13/21 2343 09/14/21 0256 09/15/21 0052 09/16/21 0143  WBC 7.4  --  7.7 8.5 8.5  NEUTROABS 5.5  --   --  7.0 7.4  HGB 11.5* 11.6*  11.2* 12.2* 11.5*  HCT 35.2* 34.0* 33.8* 36.8* 34.7*  MCV 101.1*  --  100.0 99.5 98.9  PLT 244  --  232 223 211     Basic Metabolic Panel: Recent Labs  Lab 09/13/21 2313 09/13/21 2343 09/14/21 0256 09/14/21 1927 09/15/21 0052 09/16/21 0143  NA 140 140 138 139  138 141 137  K 5.5* 5.3* 4.8 5.2*  5.1 5.4* 4.2  CL 106 105 106 107  105 105 106  CO2 26  --  23 19*  20* 20* 19*  GLUCOSE 129* 120* 109* 95  96 87 101*  BUN 114* >130* 111* 110*  109* 109* 80*  CREATININE 9.98* 10.50* 9.82* 10.70*  10.48* 10.73* 8.37*  CALCIUM 8.3*  --  8.0* 8.3*  8.2* 8.5* 8.0*  MG  --   --   --   --  3.4*  --   PHOS  --   --   --  6.4* 6.6*  --      GFR: Estimated Creatinine Clearance: 10.1 mL/min (A) (by C-G formula based on SCr of 8.37 mg/dL (H)).  Liver Function Tests: Recent Labs  Lab 09/13/21 2313 09/14/21 1927 09/15/21 0052  AST 8*  --  9*  ALT <5  --  <5  ALKPHOS 78  --  84  BILITOT 0.5  --  0.9  PROT 5.8*  --  6.2*  ALBUMIN 2.8* 2.9* 3.1*     CBG: No results for input(s): "GLUCAP" in the last 168 hours.   Recent Results (from the past 240 hour(s))  Culture, Urine (Do not remove urinary catheter, catheter placed by urology or difficult to place)     Status: None   Collection Time: 09/14/21  5:12 AM   Specimen: Urine, Catheterized  Result Value Ref Range Status   Specimen Description URINE, CATHETERIZED  Final   Special Requests NONE  Final   Culture   Final    NO GROWTH Performed at [facility], [address] [place], [place] [address]    Report Status 09/16/2021 FINAL  Final         Radiology Studies: DG CHEST PORT 1 VIEW  Result Date: 09/15/2021 CLINICAL DATA:  Central line placement. EXAM: PORTABLE CHEST 1 VIEW COMPARISON:  One-view chest x-ray 09/14/2021 FINDINGS: The heart is enlarged. Mild pulmonary vascular congestion is stable. No focal airspace disease is present. No pleural effusion is present. A new left IJ line is in place. The tip is  at the confluence of the innominate vein and SVC. No pneumothorax is present. IMPRESSION: 1. Interval placement of left IJ line without radiographic evidence for complication. 2. Stable cardiomegaly and mild pulmonary vascular congestion. Electronically Signed   By: San Morelle M.D.   On: 09/15/2021 13:38   DG Abd 2 Views  Result Date: 09/14/2021 CLINICAL DATA:  Abdominal distention EXAM: ABDOMEN - 2 VIEW COMPARISON:  05/21/2021 FINDINGS: Examination is technically less than optimal. There is no significant small bowel dilation.  There is moderate distention of colon. Stomach is not distended. IMPRESSION: Technically limited study.  Nonspecific bowel gas pattern. Electronically Signed   By: Elmer Picker M.D.   On: 09/14/2021 15:38        Scheduled Meds:  apixaban  5 mg Oral BID   carbidopa-levodopa  3 tablet Oral 3 times per day   Chlorhexidine Gluconate Cloth  6 each Topical Daily   Chlorhexidine Gluconate Cloth  6 each Topical Q0600   entacapone  200 mg Oral 3 times per day   Continuous Infusions:   LOS: 2 days    Time spent: 36 minutes.     Hosie Poisson, MD [facility]   To contact the attending provider between 7A-7P or the covering provider during after hours 7P-7A, please log into the web site [email or web address] and access using universal [place] password for that web site. If you do not have the password, please call the hospital operator.  09/16/2021, 2:20 PM

## 2021-09-16 NOTE — Progress Notes (Signed)
Mobility Specialist - Progress Note   09/16/21 1230  Mobility  Activity Dangled on edge of bed  Level of Assistance Maximum assist, patient does 25-49% (+2)  Assistive Device None  Distance Ambulated (ft) 0 ft  Activity Response Tolerated fair  $Mobility charge 1 Mobility    Pt received in bed and agreeable to mobility, very lethargic upon arrival. MaxA +2 needed to get to EOB. Completed leg exercises w/o complaint. Left in bed w/ call bell and all needs met.   Paulla Dolly Mobility Specialist

## 2021-09-16 NOTE — Progress Notes (Signed)
Received patient in bed to unit.  Alert and oriented.  Informed consent signed and in chart.   Treatment initiated: 1634 Treatment completed: 1908  Patient tolerated well.  Transported back to the room  Alert, without acute distress.  Hand-off given to patient's nurse.   Access used: catheter Access issues: none  Total UF removed: 1500 Medication(s) given: none Post HD VS: 98.6, 161/90(108), HR-116, RR-20,SP02-100 Post HD weight: 112.7kg   [facility]

## 2021-09-17 ENCOUNTER — Telehealth (HOSPITAL_COMMUNITY): Payer: Self-pay | Admitting: Pharmacy Technician

## 2021-09-17 ENCOUNTER — Other Ambulatory Visit (HOSPITAL_COMMUNITY): Payer: Self-pay

## 2021-09-17 DIAGNOSIS — G9341 Metabolic encephalopathy: Secondary | ICD-10-CM | POA: Diagnosis not present

## 2021-09-17 DIAGNOSIS — I2510 Atherosclerotic heart disease of native coronary artery without angina pectoris: Secondary | ICD-10-CM | POA: Diagnosis not present

## 2021-09-17 DIAGNOSIS — E875 Hyperkalemia: Secondary | ICD-10-CM | POA: Diagnosis not present

## 2021-09-17 DIAGNOSIS — N170 Acute kidney failure with tubular necrosis: Secondary | ICD-10-CM

## 2021-09-17 LAB — RENAL FUNCTION PANEL
Albumin: 2.6 g/dL — ABNORMAL LOW (ref 3.5–5.0)
Anion gap: 11 (ref 5–15)
BUN: 53 mg/dL — ABNORMAL HIGH (ref 8–23)
CO2: 26 mmol/L (ref 22–32)
Calcium: 8.3 mg/dL — ABNORMAL LOW (ref 8.9–10.3)
Chloride: 101 mmol/L (ref 98–111)
Creatinine, Ser: 7.66 mg/dL — ABNORMAL HIGH (ref 0.61–1.24)
GFR, Estimated: 7 mL/min — ABNORMAL LOW (ref >60)
Glucose, Bld: 115 mg/dL — ABNORMAL HIGH (ref 70–99)
Phosphorus: 5.7 mg/dL — ABNORMAL HIGH (ref 2.5–4.6)
Potassium: 3.7 mmol/L (ref 3.5–5.1)
Sodium: 138 mmol/L (ref 135–145)

## 2021-09-17 LAB — HEPATITIS B SURFACE ANTIBODY, QUANTITATIVE: Hep B S AB Quant (Post): <3.1 m[IU]/mL — ABNORMAL LOW (ref >9.9)

## 2021-09-17 MED ORDER — AMANTADINE HCL 100 MG PO CAPS: 200.0000 mg | ORAL_CAPSULE | ORAL | Status: DC

## 2021-09-17 NOTE — TOC Benefit Eligibility Note (Signed)
Patient Teacher, English as a foreign language completed.    The patient is currently admitted and upon discharge could be taking Eliquis 5 mg.  The current 30 day co-pay is $138.07.   The patient is insured through [place] [place] Patient Advocate Specialist [place] Patient Advocate Team Direct Number: [phone]  Fax: [phone]

## 2021-09-17 NOTE — Progress Notes (Signed)
PROGRESS NOTE    Isaiah Pena  Isaiah Pena DOB: 12-02-1950 DOA: 09/13/2021 PCP: Mayra Neer, MD   Brief Narrative:  Isaiah Pena is a 71 y.o. male with medical history significant of  Parkinson's disease, chronic urinary retention with indwelling foley, persistent A.fib on Xarelto, CAD s/p PCI, HTN, HLD who presents with increasing lethargy and confusion and found to have acute renal failure.  Assessment & Plan:   Principal Problem:   Acute renal failure (ARF) (HCC) Active Problems:   Persistent atrial fibrillation (HCC)   Parkinson disease ([place])   Hypertension   Coronary artery disease s/p PCI to RCA    Obesity (BMI 30-39.9)   Acute metabolic encephalopathy   Hyperkalemia   Uremia   Hypoxia   Acute AKI is likely secondary to ATN from previous contrast p Acute metabolic encephalopathy secondary to uremia -Nephrology following HD ongoing for now -Initial plan for short-term dialysis, pending renal recovery -unclear if he would be appropriate for long-term dialysis given his advanced Parkinson's and bedbound/wheelchair-bound baseline -Initial event thought to be CT abdomen pelvis 09/02/2021 with contrast   Acute metabolic encephalopathy probably secondary to uremia Continue to monitor .  Mental status continues to improve per wife at bedside Limit sedating medications and CNS depressants; remains high risk for delirium/sundowning  History of Parkinson's disease Continue with carbidopa and levodopa. Resume amantadine -pharmacy assisting with renal dosing   Hypertension, essential:  Continue metoprolol, home amlodipine on hold   Hyperlipidemia:  Currently holding statin    Obesity.  Body mass index is 38.52 kg/m.Poor prognostic factor.      Permanent atrial fibrillation  Rate continues to be elevated likely secondary to above  Continue metoprolol Continue Eliquis 5 twice daily, does not meet criteria for decreased dose due to age and body weight    Chronic  urinary retention with indwelling Foley catheter placement. Urine cultures have been negative despite abnormal appearing urine -Patient and wife indicate multiple recurrent UTIs in setting of indwelling Foley catheter  Coronary artery disease s/p PCI Asymptomatic, no indication for work-up or evaluation  DVT prophylaxis: Eliquis Code Status: DNR Family Communication: Wife at bedside  Status is: Inpatient  Dispo: The patient is from: ALF              Anticipated d/c is to: To be determined              Anticipated d/c date is: 48 to 72 hours              Patient currently not medically stable for discharge  Consultants:  Nephrology  Procedures:  Tunneled cath as above  Antimicrobials:  None  Subjective: No acute issues or events overnight denies nausea vomiting diarrhea constipation any fevers chills or chest pain.  Review of systems somewhat limited due to patient's somnolence but arousable and oriented as below  Objective: Vitals:   09/16/21 1830 09/16/21 1908 09/16/21 2047 09/17/21 0602  BP: 129/86 (!) 161/90 (!) 148/88 (!) 153/96  Pulse: (!) 103 (!) 116 (!) 108 91  Resp: '11 20 17 18  '$ Temp:  98.6 F (37 C) 97.7 F (36.5 C) 98.3 F (36.8 C)  TempSrc:  Oral Oral Oral  SpO2: 99% 100% 99% 96%  Weight:  112.7 kg    Height:        Intake/Output Summary (Last 24 hours) at 09/17/2021 0811 Last data filed at 09/17/2021 0600 Gross per 24 hour  Intake 200 ml  Output 1500 ml  Net -1300 ml  Filed Weights   09/15/21 1700 09/16/21 1628 09/16/21 1908  Weight: 114.9 kg 114.4 kg 112.7 kg    Examination:  General:  Pleasantly resting in bed, No acute distress. HEENT:  Normocephalic atraumatic.  Sclerae nonicteric, noninjected.  Extraocular movements intact bilaterally. Neck:  Without mass or deformity.  Trachea is midline. Lungs:  Clear to auscultate bilaterally without rhonchi, wheeze, or rales. Heart:  Regular rate and rhythm.  Without murmurs, rubs, or  gallops. Abdomen:  Soft, nontender, nondistended.  Without guarding or rebound. Extremities: Without cyanosis, clubbing, edema, or obvious deformity. Vascular: Left IJ temporary catheter Skin:  Warm and dry, no erythema, no ulcerations.  Data Reviewed: I have personally reviewed following labs and imaging studies  CBC: Recent Labs  Lab 09/13/21 2313 09/13/21 2343 09/14/21 0256 09/15/21 0052 09/16/21 0143  WBC 7.4  --  7.7 8.5 8.5  NEUTROABS 5.5  --   --  7.0 7.4  HGB 11.5* 11.6* 11.2* 12.2* 11.5*  HCT 35.2* 34.0* 33.8* 36.8* 34.7*  MCV 101.1*  --  100.0 99.5 98.9  PLT 244  --  232 223 741   Basic Metabolic Panel: Recent Labs  Lab 09/13/21 2313 09/13/21 2343 09/14/21 0256 09/14/21 1927 09/15/21 0052 09/16/21 0143  NA 140 140 138 139  138 141 137  K 5.5* 5.3* 4.8 5.2*  5.1 5.4* 4.2  CL 106 105 106 107  105 105 106  CO2 26  --  23 19*  20* 20* 19*  GLUCOSE 129* 120* 109* 95  96 87 101*  BUN 114* >130* 111* 110*  109* 109* 80*  CREATININE 9.98* 10.50* 9.82* 10.70*  10.48* 10.73* 8.37*  CALCIUM 8.3*  --  8.0* 8.3*  8.2* 8.5* 8.0*  MG  --   --   --   --  3.4*  --   PHOS  --   --   --  6.4* 6.6*  --    GFR: Estimated Creatinine Clearance: 10 mL/min (A) (by C-G formula based on SCr of 8.37 mg/dL (H)). Liver Function Tests: Recent Labs  Lab 09/13/21 2313 09/14/21 1927 09/15/21 0052  AST 8*  --  9*  ALT <5  --  <5  ALKPHOS 78  --  84  BILITOT 0.5  --  0.9  PROT 5.8*  --  6.2*  ALBUMIN 2.8* 2.9* 3.1*   No results for input(s): "LIPASE", "AMYLASE" in the last 168 hours. No results for input(s): "AMMONIA" in the last 168 hours. Coagulation Profile: Recent Labs  Lab 09/15/21 0812  INR 1.5*   Cardiac Enzymes: Recent Labs  Lab 09/13/21 2313  CKTOTAL 24*   BNP (last 3 results) No results for input(s): "PROBNP" in the last 8760 hours. HbA1C: No results for input(s): "HGBA1C" in the last 72 hours. CBG: No results for input(s): "GLUCAP" in the last 168  hours. Lipid Profile: No results for input(s): "CHOL", "HDL", "LDLCALC", "TRIG", "CHOLHDL", "LDLDIRECT" in the last 72 hours. Thyroid Function Tests: No results for input(s): "TSH", "T4TOTAL", "FREET4", "T3FREE", "THYROIDAB" in the last 72 hours. Anemia Panel: No results for input(s): "VITAMINB12", "FOLATE", "FERRITIN", "TIBC", "IRON", "RETICCTPCT" in the last 72 hours. Sepsis Labs: No results for input(s): "PROCALCITON", "LATICACIDVEN" in the last 168 hours.  Recent Results (from the past 240 hour(s))  Culture, Urine (Do not remove urinary catheter, catheter placed by urology or difficult to place)     Status: None   Collection Time: 09/14/21  5:12 AM   Specimen: Urine, Catheterized  Result Value Ref Range  Status   Specimen Description URINE, CATHETERIZED  Final   Special Requests NONE  Final   Culture   Final    NO GROWTH Performed at [facility], [address], [place], [place] [address]    Report Status 09/16/2021 FINAL  Final    6 radiology Studies: DG CHEST PORT 1 VIEW  Result Date: 09/15/2021 CLINICAL DATA:  Central line placement. EXAM: PORTABLE CHEST 1 VIEW COMPARISON:  One-view chest x-ray 09/14/2021 FINDINGS: The heart is enlarged. Mild pulmonary vascular congestion is stable. No focal airspace disease is present. No pleural effusion is present. A new left IJ line is in place. The tip is at the confluence of the innominate vein and SVC. No pneumothorax is present. IMPRESSION: 1. Interval placement of left IJ line without radiographic evidence for complication. 2. Stable cardiomegaly and mild pulmonary vascular congestion. Electronically Signed   By: San Morelle M.D.   On: 09/15/2021 13:38    Scheduled Meds:  amantadine  100 mg Oral Daily   apixaban  5 mg Oral BID   carbidopa-levodopa  3 tablet Oral 3 times per day   Chlorhexidine Gluconate Cloth  6 each Topical Daily   Chlorhexidine Gluconate Cloth  6 each Topical Q0600   entacapone  200 mg Oral 3 times  per day   escitalopram  5 mg Oral Daily   finasteride  5 mg Oral Daily   pantoprazole  40 mg Oral Daily   Continuous Infusions:   LOS: 3 days   Time spent: 69mn  Jahi Roza C Makana Feigel, DO [facility]  If 7PM-7AM, please contact night-coverage [email or web address]  09/17/2021, 8:11 AM

## 2021-09-17 NOTE — Progress Notes (Signed)
[facility] NEPHROLOGY PROGRESS NOTE  Assessment/ Plan: Pt is a 71 y.o. yo male with past medical history of Parkinson disease, chronic urine retention, A-fib, CAD status post stent, hypertension, HLD, admitted with increasing confusion, lethargic and AKI.  #Acute kidney injury, nonoliguric presumably ATN from IV contrast.  The patient had CT abdomen pelvis with contrast on 8/14, renal ultrasound with decompressed bladder, mild left hydro.  Received  HD on 8/27 for uremia and myoclonic jerk.   Second HD on 8/28.  Clinically improving as he looks more alert and myoclonic jerk are less.  I am awaiting the lab results from today to decide another HD today versus tomorrow.  Ordered stat renal panel. Strict ins and outs and daily lab monitoring for renal recovery.  #Myoclonic jerk and acute metabolic/uremic encephalopathy: CT head with mild cerebral atrophy with no acute finding.  We will continue dialysis for uremia.  #Chronic urine retention with indwelling Foley catheter:  #Metabolic acidosis: Managed with dialysis.  # HTN/volume: Not on antihypertensive, blood pressure acceptable.  Volume managing with dialysis now.  #Hyperkalemia: Discontinued Lokelma as he is receiving dialysis.  Subjective: I have seen and examined the patient at the bedside.  The wife was present at the bedside.  He is clearly more alert awake and oriented to hospital, month and year.  The myoclonic abnormal movements are very less.  The lab results are not available yet therefore I ordered it stat in order to decide another dialysis.  Review of system is limited but he denies most of the symptoms.   Objective Vital signs in last 24 hours: Vitals:   09/16/21 1830 09/16/21 1908 09/16/21 2047 09/17/21 0602  BP: 129/86 (!) 161/90 (!) 148/88 (!) 153/96  Pulse: (!) 103 (!) 116 (!) 108 91  Resp: '11 20 17 18  '$ Temp:  98.6 F (37 C) 97.7 F (36.5 C) 98.3 F (36.8 C)  TempSrc:  Oral Oral Oral  SpO2: 99% 100%  99% 96%  Weight:  112.7 kg    Height:       Weight change: -11.8 kg  Intake/Output Summary (Last 24 hours) at 09/17/2021 1144 Last data filed at 09/17/2021 0600 Gross per 24 hour  Intake 200 ml  Output 1500 ml  Net -1300 ml        Labs: RENAL PANEL Recent Labs    05/21/21 1203 05/22/21 0150 05/22/21 0213 05/23/21 0235 05/23/21 0240 05/25/21 0200 05/26/21 0544 05/27/21 0981 05/30/21 1914 06/03/21 7829 06/04/21 5621 06/12/21 1405 06/13/21 0305 06/14/21 0427 06/16/21 3086 07/11/21 1812 07/20/21 1912 09/02/21 2131 09/13/21 2313 09/13/21 2343 09/14/21 0256 09/14/21 1927 09/15/21 0052 09/16/21 0143  NA 140  --    < >  --    < > 142  --  143 142 139   < > 139 136  --  142 143 144 143 140 140 138 139  138 141 137  K 3.7  --    < >  --    < > 3.4*  --  3.3* 3.8 3.2*   < > 4.3 3.2*   < > 3.1* 3.5 3.7 3.4* 5.5* 5.3* 4.8 5.2*  5.1 5.4* 4.2  CL 106  --    < >  --    < > 106  --  104 108 104   < > 106 104  --  104 105 107 111 106 105 106 107  105 105 106  CO2 25  --    < >  --    < >  26  --  32 28 28   < > 25 27  --  '31 28 30 23 26  '$ --  23 19*  20* 20* 19*  GLUCOSE 129*  --    < >  --    < > 123*  --  122* 111* 122*   < > 136* 119*  --  156* 133* 132* 126* 129* 120* 109* 95  96 87 101*  BUN 57*  --    < >  --    < > 17  --  '15 16 16   '$ < > 10 7*  --  '17 13 16 23 '$ 114* >130* 111* 110*  109* 109* 80*  CREATININE 4.70*  --    < >  --    < > 0.71  --  0.73 0.82 0.85   < > 0.73 0.65  --  0.93 0.97 1.04 1.04 9.98* 10.50* 9.82* 10.70*  10.48* 10.73* 8.37*  CALCIUM 8.7*  --    < >  --    < > 8.3*  --  8.5* 8.7* 8.6*   < > 8.7* 8.1*  --  8.6* 8.7* 9.0 8.9 8.3*  --  8.0* 8.3*  8.2* 8.5* 8.0*  MG  --  2.0  --  1.5*  --  1.5* 1.6* 1.4* 1.8 1.7  --   --   --   --  1.9  --   --   --   --   --   --   --  3.4*  --   PHOS  --  3.7  --   --   --   --   --   --   --   --   --   --   --   --   --   --   --   --   --   --   --  6.4* 6.6*  --   ALBUMIN 3.5  --   --   --   --   --   --  3.0*   --   --   --  3.2*  --   --   --  3.6  --  3.4* 2.8*  --   --  2.9* 3.1*  --    < > = values in this interval not displayed.      Liver Function Tests: Recent Labs  Lab 09/13/21 2313 09/14/21 1927 09/15/21 0052  AST 8*  --  9*  ALT <5  --  <5  ALKPHOS 78  --  84  BILITOT 0.5  --  0.9  PROT 5.8*  --  6.2*  ALBUMIN 2.8* 2.9* 3.1*    No results for input(s): "LIPASE", "AMYLASE" in the last 168 hours. No results for input(s): "AMMONIA" in the last 168 hours. CBC: Recent Labs    09/13/21 2313 09/13/21 2343 09/14/21 0256 09/15/21 0052 09/16/21 0143  HGB 11.5* 11.6* 11.2* 12.2* 11.5*  MCV 101.1*  --  100.0 99.5 98.9     Cardiac Enzymes: Recent Labs  Lab 09/13/21 2313  CKTOTAL 24*    CBG: No results for input(s): "GLUCAP" in the last 168 hours.  Iron Studies: No results for input(s): "IRON", "TIBC", "TRANSFERRIN", "FERRITIN" in the last 72 hours. Studies/Results: DG CHEST PORT 1 VIEW  Result Date: 09/15/2021 CLINICAL DATA:  Central line placement. EXAM: PORTABLE CHEST 1 VIEW COMPARISON:  One-view chest x-ray 09/14/2021 FINDINGS: The heart is enlarged. Mild pulmonary vascular  congestion is stable. No focal airspace disease is present. No pleural effusion is present. A new left IJ line is in place. The tip is at the confluence of the innominate vein and SVC. No pneumothorax is present. IMPRESSION: 1. Interval placement of left IJ line without radiographic evidence for complication. 2. Stable cardiomegaly and mild pulmonary vascular congestion. Electronically Signed   By: San Morelle M.D.   On: 09/15/2021 13:38    Medications: Infusions:   Scheduled Medications:  [START ON 09/24/2021] amantadine  200 mg Oral Q7 days   apixaban  5 mg Oral BID   carbidopa-levodopa  3 tablet Oral 3 times per day   Chlorhexidine Gluconate Cloth  6 each Topical Daily   Chlorhexidine Gluconate Cloth  6 each Topical Q0600   entacapone  200 mg Oral 3 times per day   escitalopram  5 mg  Oral Daily   finasteride  5 mg Oral Daily   pantoprazole  40 mg Oral Daily    have reviewed scheduled and prn medications.  Physical Exam: General: More alert awake and oriented to hospital, August 2023. Heart:RRR, s1s2 nl Lungs: Basal decreased breath sound bilateral, no increased work of breathing Abdomen:soft, Non-tender, non-distended Extremities: Trace dependent edema present Dialysis Access: Left IJ temporary HD catheter placed by PCCM on 8/27, site clean  Dontrelle Mazon [employer] 09/17/2021,11:44 AM  LOS: 3 days

## 2021-09-17 NOTE — Care Management Important Message (Signed)
Important Message  Patient Details  Name: Isaiah Pena MRN: [id number] Date of Birth: [birth year]   Medicare Important Message Given:  Yes     Hannah Beat 09/17/2021, 11:55 AM

## 2021-09-17 NOTE — Telephone Encounter (Signed)
Pharmacy Patient Advocate Encounter  Insurance verification completed.    The patient is insured through [employer] Part D   The patient is currently admitted and ran test claims for the following: Eliquis .  Copays and coinsurance results were relayed to Inpatient clinical team.      

## 2021-09-17 NOTE — Telephone Encounter (Signed)
Pharmacy Patient Advocate Encounter  Insurance verification completed.    The patient is insured through [employer] Medicare Part D   The patient is currently admitted and ran test claims for the following: Eliquis.  Copays and coinsurance results were relayed to Inpatient clinical team.  

## 2021-09-18 DIAGNOSIS — I2510 Atherosclerotic heart disease of native coronary artery without angina pectoris: Secondary | ICD-10-CM | POA: Diagnosis not present

## 2021-09-18 DIAGNOSIS — E875 Hyperkalemia: Secondary | ICD-10-CM | POA: Diagnosis not present

## 2021-09-18 DIAGNOSIS — N170 Acute kidney failure with tubular necrosis: Secondary | ICD-10-CM | POA: Diagnosis not present

## 2021-09-18 DIAGNOSIS — G9341 Metabolic encephalopathy: Secondary | ICD-10-CM | POA: Diagnosis not present

## 2021-09-18 LAB — BASIC METABOLIC PANEL
Anion gap: 12 (ref 5–15)
BUN: 62 mg/dL — ABNORMAL HIGH (ref 8–23)
CO2: 25 mmol/L (ref 22–32)
Calcium: 8.3 mg/dL — ABNORMAL LOW (ref 8.9–10.3)
Chloride: 100 mmol/L (ref 98–111)
Creatinine, Ser: 8.64 mg/dL — ABNORMAL HIGH (ref 0.61–1.24)
GFR, Estimated: 6 mL/min — ABNORMAL LOW (ref >60)
Glucose, Bld: 142 mg/dL — ABNORMAL HIGH (ref 70–99)
Potassium: 3.4 mmol/L — ABNORMAL LOW (ref 3.5–5.1)
Sodium: 137 mmol/L (ref 135–145)

## 2021-09-18 MED ORDER — HEPARIN SODIUM (PORCINE) 1000 UNIT/ML IJ SOLN
INTRAMUSCULAR | Status: AC
  Administered 2021-09-18: 1000 [IU]
  Filled 2021-09-18: qty 4

## 2021-09-18 MED ORDER — METOPROLOL TARTRATE 50 MG PO TABS
50.0000 mg | ORAL_TABLET | Freq: Two times a day (BID) | ORAL | Status: DC
  Administered 2021-09-18 – 2021-09-20 (×5): 50 mg via ORAL
  Filled 2021-09-18 (×5): qty 1

## 2021-09-18 MED ORDER — CHLORHEXIDINE GLUCONATE CLOTH 2 % EX PADS
6.0000 | MEDICATED_PAD | Freq: Every day | CUTANEOUS | Status: DC
  Administered 2021-09-19 – 2021-09-20 (×2): 6 via TOPICAL

## 2021-09-18 NOTE — Progress Notes (Signed)
Mobility Specialist Progress Note:   09/18/21 1130  Mobility  Activity Dangled on edge of bed (+ leg and arm exercises)  Range of Motion/Exercises Active;All extremities  Level of Assistance Moderate assist, patient does 50-74% (+2)  Assistive Device None  Activity Response Tolerated well  $Mobility charge 1 Mobility   Pt eager for mobility session. Sat EOB with modA+2, drifting to R side multiple times otherwise able to do static sitting. Pt performed multiple exercises with all extremities. Able to move L extremities with more control than R. Pt back in bed with all needs met, call bell in reach.  Nelta Numbers [facility] Secure Chat or Office Phone: [phone]

## 2021-09-18 NOTE — Progress Notes (Addendum)
[facility] NEPHROLOGY PROGRESS NOTE  Assessment/ Plan: Pt is a 71 y.o. yo male with past medical history of Parkinson disease, chronic urine retention, A-fib, CAD status post stent, hypertension, HLD, admitted with increasing confusion, lethargic and AKI.  #Acute kidney injury, nonoliguric presumably ATN from IV contrast.  The patient had CT abdomen pelvis with contrast on 8/14, renal ultrasound with decompressed bladder, mild left hydro.  Received  HD on 8/27 for uremia and myoclonic jerk.   Second HD on 8/28.  Clinically improving as he looks more alert and no myoclonic jerk.   Noted both BUN and creatinine level went up today.  We will do HD today mainly for clearance and see further clinical improvement. I had a frank discussion with the patient and his wife today that the outpatient dialysis will be very challenging for him because of his decreased mobility and poor functional status.  We will continue this conversation.  #Myoclonic jerk and acute metabolic/uremic encephalopathy: CT head with mild cerebral atrophy with no acute finding.  We will continue dialysis for uremia.  #Chronic urine retention with indwelling Foley catheter:  #Metabolic acidosis: Managed with dialysis.  # HTN/volume: Not on antihypertensive, blood pressure acceptable.  Volume managing with dialysis now.  #Hyperkalemia: Potassium is now low, we will manage with dialysis.  Addendum: I discussed with patient's son over the phone about goals of care. He wants to have his father make decision mainly about dialysis etc. He will get HD today and hopefully his mental status improve further.   Subjective: I have seen and examined the patient at the bedside.  The wife is very happy to see that Mr. Casebolt ate his meal yesterday.  He looks more alert awake and able to participate in conversation however still very weak and deconditioned.  He denies nausea, vomiting chest pain or shortness of breath.  No abnormal  body movement or myoclonic jerk.  His wife was present at the bedside.   Objective Vital signs in last 24 hours: Vitals:   09/17/21 1654 09/17/21 2118 09/18/21 0445 09/18/21 0856  BP: (!) 135/92 136/88 (!) 131/99 (!) 149/98  Pulse: 96 88 98 (!) 101  Resp: '20 18 18 16  '$ Temp: 98.3 F (36.8 C) (!) 97.4 F (36.3 C) 97.9 F (36.6 C) 98.2 F (36.8 C)  TempSrc: Oral Oral Oral Oral  SpO2: 99% 96% 94% 97%  Weight:      Height:       Weight change:  No intake or output data in the 24 hours ending 09/18/21 1134      Labs: RENAL PANEL Recent Labs    05/21/21 1203 05/22/21 0150 05/22/21 0213 05/23/21 0235 05/23/21 0240 05/25/21 0200 05/26/21 0544 05/27/21 0704 05/30/21 0620 06/03/21 0807 06/04/21 0537 06/12/21 1405 06/13/21 0305 06/16/21 0849 07/11/21 1812 07/20/21 1912 09/02/21 2131 09/13/21 2313 09/13/21 2343 09/14/21 0256 09/14/21 1927 09/15/21 0052 09/16/21 0143 09/17/21 1350 09/18/21 0248  NA 140  --    < >  --    < > 142  --  143 142 139   < > 139   < > 142 143 144 143 140 140 138 139  138 141 137 138 137  K 3.7  --    < >  --    < > 3.4*  --  3.3* 3.8 3.2*   < > 4.3   < > 3.1* 3.5 3.7 3.4* 5.5* 5.3* 4.8 5.2*  5.1 5.4* 4.2 3.7 3.4*  CL 106  --    < >  --    < >  106  --  104 108 104   < > 106   < > 104 105 107 111 106 105 106 107  105 105 106 101 100  CO2 25  --    < >  --    < > 26  --  32 28 28   < > 25   < > '31 28 30 23 26  '$ --  23 19*  20* 20* 19* 26 25  GLUCOSE 129*  --    < >  --    < > 123*  --  122* 111* 122*   < > 136*   < > 156* 133* 132* 126* 129* 120* 109* 95  96 87 101* 115* 142*  BUN 57*  --    < >  --    < > 17  --  '15 16 16   '$ < > 10   < > '17 13 16 23 '$ 114* >130* 111* 110*  109* 109* 80* 53* 62*  CREATININE 4.70*  --    < >  --    < > 0.71  --  0.73 0.82 0.85   < > 0.73   < > 0.93 0.97 1.04 1.04 9.98* 10.50* 9.82* 10.70*  10.48* 10.73* 8.37* 7.66* 8.64*  CALCIUM 8.7*  --    < >  --    < > 8.3*  --  8.5* 8.7* 8.6*   < > 8.7*   < > 8.6* 8.7* 9.0  8.9 8.3*  --  8.0* 8.3*  8.2* 8.5* 8.0* 8.3* 8.3*  MG  --  2.0  --  1.5*  --  1.5* 1.6* 1.4* 1.8 1.7  --   --   --  1.9  --   --   --   --   --   --   --  3.4*  --   --   --   PHOS  --  3.7  --   --   --   --   --   --   --   --   --   --   --   --   --   --   --   --   --   --  6.4* 6.6*  --  5.7*  --   ALBUMIN 3.5  --   --   --   --   --   --  3.0*  --   --   --  3.2*  --   --  3.6  --  3.4* 2.8*  --   --  2.9* 3.1*  --  2.6*  --    < > = values in this interval not displayed.      Liver Function Tests: Recent Labs  Lab 09/13/21 2313 09/14/21 1927 09/15/21 0052 09/17/21 1350  AST 8*  --  9*  --   ALT <5  --  <5  --   ALKPHOS 78  --  84  --   BILITOT 0.5  --  0.9  --   PROT 5.8*  --  6.2*  --   ALBUMIN 2.8* 2.9* 3.1* 2.6*    No results for input(s): "LIPASE", "AMYLASE" in the last 168 hours. No results for input(s): "AMMONIA" in the last 168 hours. CBC: Recent Labs    09/13/21 2313 09/13/21 2343 09/14/21 0256 09/15/21 0052 09/16/21 0143  HGB 11.5* 11.6* 11.2* 12.2* 11.5*  MCV 101.1*  --  100.0 99.5 98.9  Cardiac Enzymes: Recent Labs  Lab 09/13/21 2313  CKTOTAL 24*    CBG: No results for input(s): "GLUCAP" in the last 168 hours.  Iron Studies: No results for input(s): "IRON", "TIBC", "TRANSFERRIN", "FERRITIN" in the last 72 hours. Studies/Results: No results found.  Medications: Infusions:   Scheduled Medications:  [START ON 09/24/2021] amantadine  200 mg Oral Q7 days   apixaban  5 mg Oral BID   carbidopa-levodopa  3 tablet Oral 3 times per day   Chlorhexidine Gluconate Cloth  6 each Topical Daily   Chlorhexidine Gluconate Cloth  6 each Topical Q0600   entacapone  200 mg Oral 3 times per day   escitalopram  5 mg Oral Daily   finasteride  5 mg Oral Daily   pantoprazole  40 mg Oral Daily    have reviewed scheduled and prn medications.  Physical Exam: General: He is alert awake and oriented. Heart:RRR, s1s2 nl Lungs: Clear bilateral, no increased  work of breathing Abdomen:soft, Non-tender, non-distended Extremities: Trace dependent edema present Dialysis Access: Left IJ temporary HD catheter placed by PCCM on 8/27, site clean  Alexus Galka [employer] 09/18/2021,11:34 AM  LOS: 4 days

## 2021-09-18 NOTE — Progress Notes (Signed)
Received patient in bed to unit.  Alert and oriented.  Informed consent signed and in chart.   Treatment initiated: 1559 Treatment completed: 2005  Patient tolerated well.  Transported back to the room  Alert, without acute distress.  Hand-off given to patient's nurse.   Access used: Catheter Access issues: none  Total UF removed: 1500 Medication(s) given: none Post HD VS: 98.9, 140/110(121), HR-89, RR-15, SP02-97 Post HD weight: 113.2kg   [facility]

## 2021-09-18 NOTE — Progress Notes (Signed)
   09/18/21 2048  Assess: MEWS Score  Temp 98 F (36.7 C)  BP (!) 163/95  ECG Heart Rate (!) 129  Resp 16  SpO2 96 %  O2 Device Room Air  Assess: MEWS Score  MEWS Temp 0  MEWS Systolic 0  MEWS Pulse 2  MEWS RR 0  MEWS LOC 0  MEWS Score 2  MEWS Score Color Yellow  Assess: if the MEWS score is Yellow or Red  Were vital signs taken at a resting state? Yes  Focused Assessment No change from prior assessment  Does the patient meet 2 or more of the SIRS criteria? Yes  Does the patient have a confirmed or suspected source of infection? No  MEWS guidelines implemented *See Row Information* Yes  Treat  MEWS Interventions Escalated (See documentation below)  Pain Scale 0-10  Pain Score 0  Take Vital Signs  Increase Vital Sign Frequency  Yellow: Q 2hr X 2 then Q 4hr X 2, if remains yellow, continue Q 4hrs  Escalate  MEWS: Escalate Yellow: discuss with charge nurse/RN and consider discussing with provider and RRT  Notify: Charge Nurse/RN  Name of Charge Nurse/RN Notified Tanesha, RN  Date Charge Nurse/RN Notified 09/18/21  Time Charge Nurse/RN Notified 2048  Notify: Provider  Provider Name/Title V. Marlowe Sax, MD  Date Provider Notified 09/18/21  Time Provider Notified 2051  Method of Notification Page  Notification Reason Other (Comment) (BP 163/95, HR in the 130s)  Provider response Evaluate remotely  Date of Provider Response 09/18/21  Time of Provider Response 2051  Document  Patient Outcome Stabilized after interventions  Progress note created (see row info) Yes  Assess: SIRS CRITERIA  SIRS Temperature  0  SIRS Pulse 1  SIRS Respirations  0  SIRS WBC 1  SIRS Score Sum  2

## 2021-09-18 NOTE — Progress Notes (Signed)
PROGRESS NOTE    Isaiah Pena  GLO:[id number] DOB: 05-21-1950 DOA: 09/13/2021 PCP: Mayra Neer, MD   Brief Narrative:  Isaiah Pena is a 71 y.o. male with medical history significant of  Parkinson's disease, chronic urinary retention with indwelling foley, persistent A.fib on Xarelto, CAD s/p PCI, HTN, HLD who presents with increasing lethargy and confusion and found to have acute renal failure.  Assessment & Plan:   Principal Problem:   Acute renal failure (ARF) (HCC) Active Problems:   Persistent atrial fibrillation (HCC)   Parkinson disease ([place])   Hypertension   Coronary artery disease s/p PCI to RCA    Obesity (BMI 30-39.9)   Acute metabolic encephalopathy   Hyperkalemia   Uremia   Hypoxia  Acute AKI is likely secondary to ATN from previous contrast p Acute metabolic encephalopathy secondary to uremia -Nephrology following HD ongoing for now; scheduling per their recommendations -Initial plan for short-term dialysis, pending renal recovery -unclear if he would be appropriate for long-term dialysis given his advanced Parkinson's and bedbound/wheelchair-bound baseline -Initial event thought to be CT abdomen pelvis 09/02/2021 with contrast -Urine output appears to be downtrending, does not bode well for renal recovery   Acute metabolic encephalopathy probably secondary to uremia  -Continue to monitor .  -Mental status continues to be labile, less appropriate and oriented compared to yesterday but generally improving -Limit sedating medications and CNS depressants; remains high risk for delirium/sundowning  History of Parkinson's disease Continue with carbidopa and levodopa. Resume amantadine -pharmacy assisting with renal dosing   Hypertension, essential:  Continue metoprolol, home amlodipine on hold   Hyperlipidemia:  Currently holding statin    Obesity.  Body mass index is 38.52 kg/m.Poor prognostic factor.   Permanent atrial fibrillation  Rate continues  to be elevated likely secondary to above  Continue metoprolol Continue Eliquis 5 twice daily, does not meet criteria for decreased dose due to age and body weight    Chronic urinary retention with indwelling Foley catheter placement. Urine cultures have been negative despite abnormal appearing urine -Patient and wife indicate multiple recurrent UTIs in setting of indwelling Foley catheter  Coronary artery disease s/p PCI Asymptomatic, no indication for work-up or evaluation  DVT prophylaxis: Eliquis Code Status: DNR Family Communication: Wife at bedside  Status is: Inpatient  Dispo: The patient is from: ALF              Anticipated d/c is to: To be determined              Anticipated d/c date is: 72+ hours              Patient currently not medically stable for discharge  Consultants:  Nephrology  Procedures:  Tunneled cath as above  Antimicrobials:  None  Subjective: No acute issues or events overnight unable to undergo dialysis yesterday due to scheduling conflict, mental status marginally worse today but able to orient to self and location denies nausea vomiting diarrhea constipation headache fevers chills or chest pain Objective: Vitals:   09/17/21 0602 09/17/21 1654 09/17/21 2118 09/18/21 0445  BP: (!) 153/96 (!) 135/92 136/88 (!) 131/99  Pulse: 91 96 88 98  Resp: '18 20 18 18  '$ Temp: 98.3 F (36.8 C) 98.3 F (36.8 C) (!) 97.4 F (36.3 C) 97.9 F (36.6 C)  TempSrc: Oral Oral Oral Oral  SpO2: 96% 99% 96% 94%  Weight:      Height:       No intake or output data in the  24 hours ending 09/18/21 0752  Filed Weights   09/15/21 1700 09/16/21 1628 09/16/21 1908  Weight: 114.9 kg 114.4 kg 112.7 kg    Examination:  General:  Pleasantly resting in bed, No acute distress. HEENT:  Normocephalic atraumatic.  Sclerae nonicteric, noninjected.  Extraocular movements intact bilaterally. Neck:  Without mass or deformity.  Trachea is midline. Lungs:  Clear to auscultate  bilaterally without rhonchi, wheeze, or rales. Heart:  Regular rate and rhythm.  Without murmurs, rubs, or gallops. Abdomen:  Soft, nontender, nondistended.  Without guarding or rebound. Extremities: Without cyanosis, clubbing, edema, or obvious deformity. Vascular: Left IJ temporary catheter Skin:  Warm and dry, no erythema, no ulcerations.  Data Reviewed: I have personally reviewed following labs and imaging studies  CBC: Recent Labs  Lab 09/13/21 2313 09/13/21 2343 09/14/21 0256 09/15/21 0052 09/16/21 0143  WBC 7.4  --  7.7 8.5 8.5  NEUTROABS 5.5  --   --  7.0 7.4  HGB 11.5* 11.6* 11.2* 12.2* 11.5*  HCT 35.2* 34.0* 33.8* 36.8* 34.7*  MCV 101.1*  --  100.0 99.5 98.9  PLT 244  --  232 223 161    Basic Metabolic Panel: Recent Labs  Lab 09/14/21 1927 09/15/21 0052 09/16/21 0143 09/17/21 1350 09/18/21 0248  NA 139  138 141 137 138 137  K 5.2*  5.1 5.4* 4.2 3.7 3.4*  CL 107  105 105 106 101 100  CO2 19*  20* 20* 19* 26 25  GLUCOSE 95  96 87 101* 115* 142*  BUN 110*  109* 109* 80* 53* 62*  CREATININE 10.70*  10.48* 10.73* 8.37* 7.66* 8.64*  CALCIUM 8.3*  8.2* 8.5* 8.0* 8.3* 8.3*  MG  --  3.4*  --   --   --   PHOS 6.4* 6.6*  --  5.7*  --     GFR: Estimated Creatinine Clearance: 9.7 mL/min (A) (by C-G formula based on SCr of 8.64 mg/dL (H)). Liver Function Tests: Recent Labs  Lab 09/13/21 2313 09/14/21 1927 09/15/21 0052 09/17/21 1350  AST 8*  --  9*  --   ALT <5  --  <5  --   ALKPHOS 78  --  84  --   BILITOT 0.5  --  0.9  --   PROT 5.8*  --  6.2*  --   ALBUMIN 2.8* 2.9* 3.1* 2.6*    No results for input(s): "LIPASE", "AMYLASE" in the last 168 hours. No results for input(s): "AMMONIA" in the last 168 hours. Coagulation Profile: Recent Labs  Lab 09/15/21 0812  INR 1.5*    Cardiac Enzymes: Recent Labs  Lab 09/13/21 2313  CKTOTAL 24*    BNP (last 3 results) No results for input(s): "PROBNP" in the last 8760 hours. HbA1C: No results for  input(s): "HGBA1C" in the last 72 hours. CBG: No results for input(s): "GLUCAP" in the last 168 hours. Lipid Profile: No results for input(s): "CHOL", "HDL", "LDLCALC", "TRIG", "CHOLHDL", "LDLDIRECT" in the last 72 hours. Thyroid Function Tests: No results for input(s): "TSH", "T4TOTAL", "FREET4", "T3FREE", "THYROIDAB" in the last 72 hours. Anemia Panel: No results for input(s): "VITAMINB12", "FOLATE", "FERRITIN", "TIBC", "IRON", "RETICCTPCT" in the last 72 hours. Sepsis Labs: No results for input(s): "PROCALCITON", "LATICACIDVEN" in the last 168 hours.  Recent Results (from the past 240 hour(s))  Culture, Urine (Do not remove urinary catheter, catheter placed by urology or difficult to place)     Status: None   Collection Time: 09/14/21  5:12 AM   Specimen:  Urine, Catheterized  Result Value Ref Range Status   Specimen Description URINE, CATHETERIZED  Final   Special Requests NONE  Final   Culture   Final    NO GROWTH Performed at [facility], [address], [place], [place] [address]    Report Status 09/16/2021 FINAL  Final    6 radiology Studies: No results found.  Scheduled Meds:  [START ON 09/24/2021] amantadine  200 mg Oral Q7 days   apixaban  5 mg Oral BID   carbidopa-levodopa  3 tablet Oral 3 times per day   Chlorhexidine Gluconate Cloth  6 each Topical Daily   Chlorhexidine Gluconate Cloth  6 each Topical Q0600   entacapone  200 mg Oral 3 times per day   escitalopram  5 mg Oral Daily   finasteride  5 mg Oral Daily   pantoprazole  40 mg Oral Daily   Continuous Infusions:   LOS: 4 days   Time spent: 49mn  Aimy Sweeting C Samari Gorby, DO [facility]  If 7PM-7AM, please contact night-coverage [email or web address]  09/18/2021, 7:52 AM

## 2021-09-19 DIAGNOSIS — G9341 Metabolic encephalopathy: Secondary | ICD-10-CM | POA: Diagnosis not present

## 2021-09-19 DIAGNOSIS — E875 Hyperkalemia: Secondary | ICD-10-CM | POA: Diagnosis not present

## 2021-09-19 DIAGNOSIS — I2510 Atherosclerotic heart disease of native coronary artery without angina pectoris: Secondary | ICD-10-CM | POA: Diagnosis not present

## 2021-09-19 DIAGNOSIS — N170 Acute kidney failure with tubular necrosis: Secondary | ICD-10-CM | POA: Diagnosis not present

## 2021-09-19 LAB — BASIC METABOLIC PANEL
Anion gap: 10 (ref 5–15)
BUN: 45 mg/dL — ABNORMAL HIGH (ref 8–23)
CO2: 25 mmol/L (ref 22–32)
Calcium: 8.1 mg/dL — ABNORMAL LOW (ref 8.9–10.3)
Chloride: 102 mmol/L (ref 98–111)
Creatinine, Ser: 7.66 mg/dL — ABNORMAL HIGH (ref 0.61–1.24)
GFR, Estimated: 7 mL/min — ABNORMAL LOW (ref >60)
Glucose, Bld: 129 mg/dL — ABNORMAL HIGH (ref 70–99)
Potassium: 3.4 mmol/L — ABNORMAL LOW (ref 3.5–5.1)
Sodium: 137 mmol/L (ref 135–145)

## 2021-09-19 MED ORDER — POLYETHYLENE GLYCOL 3350 17 G PO PACK: 17.0000 g | PACK | Freq: Two times a day (BID) | ORAL | Status: DC

## 2021-09-19 MED ORDER — POLYETHYLENE GLYCOL 3350 17 G PO PACK: 17.0000 g | PACK | Freq: Every day | ORAL | Status: DC | PRN

## 2021-09-19 MED ORDER — HYDRALAZINE HCL 20 MG/ML IJ SOLN
5.0000 mg | Freq: Once | INTRAMUSCULAR | Status: AC
  Administered 2021-09-19: 5 mg via INTRAVENOUS
  Filled 2021-09-19: qty 1

## 2021-09-19 MED ORDER — SENNOSIDES-DOCUSATE SODIUM 8.6-50 MG PO TABS: 1.0000 | ORAL_TABLET | Freq: Two times a day (BID) | ORAL | Status: DC | PRN

## 2021-09-19 NOTE — Progress Notes (Signed)
PROGRESS NOTE    Isaiah Pena  DJM:[id number] DOB: 1950/06/26 DOA: 09/13/2021 PCP: Mayra Neer, MD   Brief Narrative:  Isaiah Pena is a 71 y.o. male with medical history significant of  Parkinson's disease, chronic urinary retention with indwelling foley, persistent A.fib on Xarelto, CAD s/p PCI, HTN, HLD who presents with increasing lethargy and confusion and found to have acute renal failure.  Assessment & Plan:   Principal Problem:   Acute renal failure (ARF) (HCC) Active Problems:   Persistent atrial fibrillation (HCC)   Parkinson disease ([place])   Hypertension   Coronary artery disease s/p PCI to RCA    Obesity (BMI 30-39.9)   Acute metabolic encephalopathy   Hyperkalemia   Uremia   Hypoxia  Acute AKI is likely secondary to ATN from previous contrast p Acute metabolic encephalopathy secondary to uremia -Nephrology following HD ongoing for now; scheduling per their recommendations -Initial plan for short-term dialysis, pending renal recovery -unclear if he would be appropriate for long-term dialysis given his advanced Parkinson's and bedbound/wheelchair-bound baseline -Initial event thought to be CT abdomen pelvis 09/02/2021 with contrast -Urine output appears to be downtrending, does not bode well for renal recovery   Acute metabolic encephalopathy secondary to uremia  -Continue to monitor .  -Mental status continues to be labile, improving with repeat HD/decreased BUN -Limit sedating medications and CNS depressants; remains high risk for delirium/sundowning  History of Parkinson's disease Continue with carbidopa and levodopa. Resume amantadine -pharmacy assisting with renal dosing   Hypertension, essential:  Continue metoprolol, home amlodipine on hold   Hyperlipidemia:  Currently holding statin    Obesity.  Body mass index is 38.52 kg/m.Poor prognostic factor.   Permanent atrial fibrillation  Elevated secondary to above - ? RVR overnight in HD -  resolved with metoprolol Continue metoprolol Continue Eliquis 5 twice daily, does not meet criteria for decreased dose due to age and body weight    Chronic urinary retention with indwelling Foley catheter placement. Urine cultures have been negative despite abnormal appearing urine -Patient and wife indicate multiple recurrent UTIs in setting of indwelling Foley catheter  Coronary artery disease s/p PCI Asymptomatic, no indication for work-up or evaluation  DVT prophylaxis: Eliquis Code Status: DNR Family Communication: Wife at bedside  Status is: Inpatient  Dispo: The patient is from: ALF              Anticipated d/c is to: To be determined              Anticipated d/c date is: 72+ hours              Patient currently not medically stable for discharge  Consultants:  Nephrology  Procedures:  Tunneled cath as above  Antimicrobials:  None  Subjective: No acute issues or events overnight - tolerated HD well yesterday; denies nausea vomiting diarrhea constipation headache fevers chills or chest pain  Objective: Vitals:   09/18/21 2109 09/18/21 2239 09/19/21 0042 09/19/21 0435  BP:  (!) 143/85 (!) 153/106 (!) 155/95  Pulse: (!) 126 (!) 113 (!) 107 88  Resp:  '18 17 18  '$ Temp:  98.6 F (37 C) 98.1 F (36.7 C) 98.4 F (36.9 C)  TempSrc:  Oral Oral Oral  SpO2:  96% 97% 97%  Weight:      Height:        Intake/Output Summary (Last 24 hours) at 09/19/2021 0813 Last data filed at 09/19/2021 0513 Gross per 24 hour  Intake --  Output 151.5 ml  Net -151.5 ml    Filed Weights   09/16/21 1908 09/18/21 1541 09/18/21 2020  Weight: 112.7 kg 114.7 kg 113.2 kg    Examination:  General:  Pleasantly resting in bed, No acute distress. HEENT:  Normocephalic atraumatic.  Sclerae nonicteric, noninjected.  Extraocular movements intact bilaterally. Neck:  Without mass or deformity.  Trachea is midline. Lungs:  Clear to auscultate bilaterally without rhonchi, wheeze, or  rales. Heart:  Regular rate and rhythm.  Without murmurs, rubs, or gallops. Abdomen:  Soft, nontender, nondistended.  Without guarding or rebound. Extremities: Without cyanosis, clubbing, edema, or obvious deformity. Vascular: Left IJ temporary catheter Skin:  Warm and dry, no erythema, no ulcerations.  Data Reviewed: I have personally reviewed following labs and imaging studies  CBC: Recent Labs  Lab 09/13/21 2313 09/13/21 2343 09/14/21 0256 09/15/21 0052 09/16/21 0143  WBC 7.4  --  7.7 8.5 8.5  NEUTROABS 5.5  --   --  7.0 7.4  HGB 11.5* 11.6* 11.2* 12.2* 11.5*  HCT 35.2* 34.0* 33.8* 36.8* 34.7*  MCV 101.1*  --  100.0 99.5 98.9  PLT 244  --  232 223 196    Basic Metabolic Panel: Recent Labs  Lab 09/14/21 1927 09/15/21 0052 09/16/21 0143 09/17/21 1350 09/18/21 0248 09/19/21 0116  NA 139  138 141 137 138 137 137  K 5.2*  5.1 5.4* 4.2 3.7 3.4* 3.4*  CL 107  105 105 106 101 100 102  CO2 19*  20* 20* 19* '26 25 25  '$ GLUCOSE 95  96 87 101* 115* 142* 129*  BUN 110*  109* 109* 80* 53* 62* 45*  CREATININE 10.70*  10.48* 10.73* 8.37* 7.66* 8.64* 7.66*  CALCIUM 8.3*  8.2* 8.5* 8.0* 8.3* 8.3* 8.1*  MG  --  3.4*  --   --   --   --   PHOS 6.4* 6.6*  --  5.7*  --   --     GFR: Estimated Creatinine Clearance: 11 mL/min (A) (by C-G formula based on SCr of 7.66 mg/dL (H)). Liver Function Tests: Recent Labs  Lab 09/13/21 2313 09/14/21 1927 09/15/21 0052 09/17/21 1350  AST 8*  --  9*  --   ALT <5  --  <5  --   ALKPHOS 78  --  84  --   BILITOT 0.5  --  0.9  --   PROT 5.8*  --  6.2*  --   ALBUMIN 2.8* 2.9* 3.1* 2.6*    No results for input(s): "LIPASE", "AMYLASE" in the last 168 hours. No results for input(s): "AMMONIA" in the last 168 hours. Coagulation Profile: Recent Labs  Lab 09/15/21 0812  INR 1.5*    Cardiac Enzymes: Recent Labs  Lab 09/13/21 2313  CKTOTAL 24*    BNP (last 3 results) No results for input(s): "PROBNP" in the last 8760  hours. HbA1C: No results for input(s): "HGBA1C" in the last 72 hours. CBG: No results for input(s): "GLUCAP" in the last 168 hours. Lipid Profile: No results for input(s): "CHOL", "HDL", "LDLCALC", "TRIG", "CHOLHDL", "LDLDIRECT" in the last 72 hours. Thyroid Function Tests: No results for input(s): "TSH", "T4TOTAL", "FREET4", "T3FREE", "THYROIDAB" in the last 72 hours. Anemia Panel: No results for input(s): "VITAMINB12", "FOLATE", "FERRITIN", "TIBC", "IRON", "RETICCTPCT" in the last 72 hours. Sepsis Labs: No results for input(s): "PROCALCITON", "LATICACIDVEN" in the last 168 hours.  Recent Results (from the past 240 hour(s))  Culture, Urine (Do not remove urinary catheter, catheter placed by urology or difficult to place)  Status: None   Collection Time: 09/14/21  5:12 AM   Specimen: Urine, Catheterized  Result Value Ref Range Status   Specimen Description URINE, CATHETERIZED  Final   Special Requests NONE  Final   Culture   Final    NO GROWTH Performed at [facility], [address] [place], [place] [address]    Report Status 09/16/2021 FINAL  Final    6 radiology Studies: No results found.  Scheduled Meds:  [START ON 09/24/2021] amantadine  200 mg Oral Q7 days   apixaban  5 mg Oral BID   carbidopa-levodopa  3 tablet Oral 3 times per day   Chlorhexidine Gluconate Cloth  6 each Topical Daily   Chlorhexidine Gluconate Cloth  6 each Topical Q0600   Chlorhexidine Gluconate Cloth  6 each Topical Q0600   entacapone  200 mg Oral 3 times per day   escitalopram  5 mg Oral Daily   finasteride  5 mg Oral Daily   metoprolol tartrate  50 mg Oral BID   pantoprazole  40 mg Oral Daily   Continuous Infusions:   LOS: 5 days   Time spent: 9mn  Korde Jeppsen C Chamia Schmutz, DO [facility]  If 7PM-7AM, please contact night-coverage [email or web address]  09/19/2021, 8:13 AM

## 2021-09-19 NOTE — Progress Notes (Signed)
[facility] NEPHROLOGY PROGRESS NOTE  Assessment/ Plan: Pt is a 71 y.o. yo male with past medical history of Parkinson disease, chronic urine retention, A-fib, CAD status post stent, hypertension, HLD, admitted with increasing confusion, lethargic and AKI.  #Acute kidney injury, nonoliguric presumably ATN from IV contrast.  The patient had CT abdomen pelvis with contrast on 8/14, renal ultrasound with decompressed bladder, mild left hydro.  Received  HD on 8/27, 8/28 and 8/30 for uremia and myoclonic jerk.   After 3 session of dialysis, the mental status is significantly improved as well as no myoclonic jerk.  However, he continued to remain physically deconditioned and frail.  I had another long discussion with the patient, his significant other and his son who was on the phone about clinical course, dialysis etc.  We planned to monitor renal function for next few days for any sign of recovery.  I mentioned to them that the outpatient dialysis will be very challenging because of his Parkinson disease, decreased mobility and poor functional status.  They would like to talk to palliative care team and discuss among family before making any decision.  I will continue this conversation.  #Myoclonic jerk and acute metabolic/uremic encephalopathy: CT head with mild cerebral atrophy with no acute finding.  It seems like it has improved after dialysis.  #Chronic urine retention with indwelling Foley catheter:  #Metabolic acidosis: improved with dialysis.  # HTN/volume: Not on antihypertensive, blood pressure acceptable.  Volume managing with dialysis now.  #Hyperkalemia: Potassium is now low, I will replete it.  Subjective: I have seen and examined the patient.  He is alert awake and oriented today.  Physical he still very weak.  His significant other was present at the bedside.  The patient denies nausea, vomiting, dysgeusia, chest pain, shortness of breath.  No urine output  recorded.   Objective Vital signs in last 24 hours: Vitals:   09/18/21 2239 09/19/21 0042 09/19/21 0435 09/19/21 0856  BP: (!) 143/85 (!) 153/106 (!) 155/95 (!) 143/95  Pulse: (!) 113 (!) 107 88 97  Resp: '18 17 18 17  '$ Temp: 98.6 F (37 C) 98.1 F (36.7 C) 98.4 F (36.9 C) 98.4 F (36.9 C)  TempSrc: Oral Oral Oral Oral  SpO2: 96% 97% 97% 98%  Weight:      Height:       Weight change:   Intake/Output Summary (Last 24 hours) at 09/19/2021 1044 Last data filed at 09/19/2021 0513 Gross per 24 hour  Intake --  Output 151.5 ml  Net -151.5 ml        Labs: RENAL PANEL Recent Labs    05/21/21 1203 05/22/21 0150 05/22/21 0213 05/23/21 0235 05/23/21 0240 05/25/21 0200 05/26/21 0544 05/27/21 0704 05/30/21 0620 06/03/21 0807 06/04/21 0537 06/12/21 1405 06/13/21 0305 06/16/21 0849 07/11/21 1812 07/20/21 1912 09/02/21 2131 09/13/21 2313 09/13/21 2343 09/14/21 0256 09/14/21 1927 09/15/21 0052 09/16/21 0143 09/17/21 1350 09/18/21 0248 09/19/21 0116  NA 140  --    < >  --    < > 142  --  143 142 139   < > 139   < > 142 143 144 143 140 140 138 139  138 141 137 138 137 137  K 3.7  --    < >  --    < > 3.4*  --  3.3* 3.8 3.2*   < > 4.3   < > 3.1* 3.5 3.7 3.4* 5.5* 5.3* 4.8 5.2*  5.1 5.4* 4.2 3.7 3.4* 3.4*  CL 106  --    < >  --    < > 106  --  104 108 104   < > 106   < > 104 105 107 111 106 105 106 107  105 105 106 101 100 102  CO2 25  --    < >  --    < > 26  --  32 28 28   < > 25   < > '31 28 30 23 26  '$ --  23 19*  20* 20* 19* '26 25 25  '$ GLUCOSE 129*  --    < >  --    < > 123*  --  122* 111* 122*   < > 136*   < > 156* 133* 132* 126* 129* 120* 109* 95  96 87 101* 115* 142* 129*  BUN 57*  --    < >  --    < > 17  --  '15 16 16   '$ < > 10   < > '17 13 16 23 '$ 114* >130* 111* 110*  109* 109* 80* 53* 62* 45*  CREATININE 4.70*  --    < >  --    < > 0.71  --  0.73 0.82 0.85   < > 0.73   < > 0.93 0.97 1.04 1.04 9.98* 10.50* 9.82* 10.70*  10.48* 10.73* 8.37* 7.66* 8.64* 7.66*   CALCIUM 8.7*  --    < >  --    < > 8.3*  --  8.5* 8.7* 8.6*   < > 8.7*   < > 8.6* 8.7* 9.0 8.9 8.3*  --  8.0* 8.3*  8.2* 8.5* 8.0* 8.3* 8.3* 8.1*  MG  --  2.0  --  1.5*  --  1.5* 1.6* 1.4* 1.8 1.7  --   --   --  1.9  --   --   --   --   --   --   --  3.4*  --   --   --   --   PHOS  --  3.7  --   --   --   --   --   --   --   --   --   --   --   --   --   --   --   --   --   --  6.4* 6.6*  --  5.7*  --   --   ALBUMIN 3.5  --   --   --   --   --   --  3.0*  --   --   --  3.2*  --   --  3.6  --  3.4* 2.8*  --   --  2.9* 3.1*  --  2.6*  --   --    < > = values in this interval not displayed.      Liver Function Tests: Recent Labs  Lab 09/13/21 2313 09/14/21 1927 09/15/21 0052 09/17/21 1350  AST 8*  --  9*  --   ALT <5  --  <5  --   ALKPHOS 78  --  84  --   BILITOT 0.5  --  0.9  --   PROT 5.8*  --  6.2*  --   ALBUMIN 2.8* 2.9* 3.1* 2.6*    No results for input(s): "LIPASE", "AMYLASE" in the last 168 hours. No results for input(s): "AMMONIA" in the last 168 hours. CBC:  Recent Labs    09/13/21 2313 09/13/21 2343 09/14/21 0256 09/15/21 0052 09/16/21 0143  HGB 11.5* 11.6* 11.2* 12.2* 11.5*  MCV 101.1*  --  100.0 99.5 98.9     Cardiac Enzymes: Recent Labs  Lab 09/13/21 2313  CKTOTAL 24*    CBG: No results for input(s): "GLUCAP" in the last 168 hours.  Iron Studies: No results for input(s): "IRON", "TIBC", "TRANSFERRIN", "FERRITIN" in the last 72 hours. Studies/Results: No results found.  Medications: Infusions:   Scheduled Medications:  [START ON 09/24/2021] amantadine  200 mg Oral Q7 days   apixaban  5 mg Oral BID   carbidopa-levodopa  3 tablet Oral 3 times per day   Chlorhexidine Gluconate Cloth  6 each Topical Daily   Chlorhexidine Gluconate Cloth  6 each Topical Q0600   Chlorhexidine Gluconate Cloth  6 each Topical Q0600   entacapone  200 mg Oral 3 times per day   escitalopram  5 mg Oral Daily   finasteride  5 mg Oral Daily   metoprolol tartrate  50 mg Oral  BID   pantoprazole  40 mg Oral Daily    have reviewed scheduled and prn medications.  Physical Exam: General: Alert awake, oriented, able to lie flat Heart:RRR, s1s2 nl Lungs: Clear bilateral, no wheezing or crackles Abdomen:soft, Non-tender, non-distended Extremities: No peripheral edema present. Dialysis Access: Left IJ temporary HD catheter placed by PCCM on 8/27, site clean  Isaiah Pena 09/19/2021,10:44 AM  LOS: 5 days

## 2021-09-19 NOTE — TOC Initial Note (Signed)
Transition of Care [facility]) - Initial/Assessment Note    Patient Details  Name: Isaiah Pena MRN: [id number] Date of Birth: [birth year]  Transition of Care [facility]) CM/SW Contact:    Tresa Endo Phone Number: 09/19/2021, 4:54 PM  Clinical Narrative:                 CSW spoke with pt spouse, she would like for pt to get better and return to [place] at [place]. Pt spouse would like for CSW closer to time of DC. There are no therapy recommendations. CSW will continue to follow.   Expected Discharge Plan: [place] Barriers to Discharge: Continued Medical Work up   Patient Goals and CMS Choice Patient states their goals for this hospitalization and ongoing recovery are:: Return to [place] CMS Medicare.gov Compare Post Acute Care list provided to:: Patient Choice offered to / list presented to : Patient  Expected Discharge Plan and Services Expected Discharge Plan: [place] In-house Referral: Clinical Social Work   Post Acute Care Choice: [place] Living arrangements for the past 2 months: [place]                                      Prior Living Arrangements/Services Living arrangements for the past 2 months: [place] Lives with:: Facility Resident Patient language and need for interpreter reviewed:: Yes        Need for Family Participation in Patient Care: Yes (Comment) Care giver support system in place?: Yes (comment)   Criminal Activity/Legal Involvement Pertinent to Current Situation/Hospitalization: No - Comment as needed  Activities of Daily Living Home Assistive Devices/Equipment: Eyeglasses, Environmental consultant (specify type) ADL Screening (condition at time of admission) Patient's cognitive ability adequate to safely complete daily activities?: Yes Is the patient deaf or have difficulty hearing?: No Does the patient have difficulty seeing, even when wearing glasses/contacts?: No Does  the patient have difficulty concentrating, remembering, or making decisions?: No Patient able to express need for assistance with ADLs?: Yes Does the patient have difficulty dressing or bathing?: Yes Independently performs ADLs?: No Communication: Independent Dressing (OT): Dependent Is this a change from baseline?: Pre-admission baseline Grooming: Needs assistance Is this a change from baseline?: Pre-admission baseline Feeding: Needs assistance Is this a change from baseline?: Pre-admission baseline Bathing: Dependent Is this a change from baseline?: Pre-admission baseline Toileting: Dependent Is this a change from baseline?: Pre-admission baseline In/Out Bed: Needs assistance Is this a change from baseline?: Pre-admission baseline Walks in Home: Needs assistance Is this a change from baseline?: Pre-admission baseline Does the patient have difficulty walking or climbing stairs?: Yes Weakness of Legs: Both Weakness of Arms/Hands: Both  Permission Sought/Granted                  Emotional Assessment Appearance:: Appears stated age Attitude/Demeanor/Rapport: Unable to Assess Affect (typically observed): Unable to Assess Orientation: : Oriented to Self, Oriented to Place, Oriented to  Time, Oriented to Situation Alcohol / Substance Use: Not Applicable Psych Involvement: No (comment)  Admission diagnosis:  Acute renal failure (ARF) ([place]) [N17.9] Acute renal failure, unspecified acute renal failure type [facility]) [N17.9] Patient Active Problem List   Diagnosis Date Noted   Acute renal failure (ARF) ([place]) 93/81/0175   Acute metabolic encephalopathy 11/13/8525   Hyperkalemia 09/14/2021   Uremia 09/14/2021   Hypoxia 09/14/2021   Obesity (BMI 30-39.9) 06/16/2021  Persistent atrial fibrillation with RVR ([place]) 56/81/2751   Complicated UTI (urinary tract infection) 06/12/2021   Urinary retention with incomplete bladder emptying 06/12/2021   Debility 05/24/2021   AKI with acute  urinary retention  05/21/2021   Constipation 05/21/2021   Right renal mass 05/21/2021   Leukocytosis 05/21/2021   Parkinson disease ([place]) 05/21/2021   Persistent atrial fibrillation ([place]) 11/08/2012   Central serous retinopathy    Hypertension    GERD (gastroesophageal reflux disease)    Kidney stone    History of torn meniscus of right knee    Edema of extremities    Hyperlipidemia    BPH (benign prostatic hyperplasia)    Coronary artery disease s/p PCI to RCA     OSA (obstructive sleep apnea) 09/29/2011   PCP:  Mayra Neer, MD Pharmacy:   [place], [place] - [address] [place] [address] Phone: [phone] Fax: [place], [address] AT [facility] [address] [place] [address] Phone: [phone] Fax: [phone]     Social Determinants of Health (SDOH) Interventions    Readmission Risk Interventions     No data to display

## 2021-09-19 NOTE — NC FL2 (Signed)
[place] LEVEL OF CARE SCREENING TOOL     IDENTIFICATION  Patient Name: Isaiah Pena Birthdate: 06-23-1950 Sex: male Admission Date (Current Location): 09/13/2021  [facility] and [place] Number:  Herbalist and Address:  [facility] [place] [address] [place], [place] [address]      Provider Number: [id number]  Attending Physician Name and Address:  Little Ishikawa, MD  Relative Name and Phone Number:  Zahari, Xiang [phone])   [phone]    Current Level of Care: Hospital Recommended Level of Care: [place] Prior Approval Number:    Date Approved/Denied:   PASRR Number: [id number]  Discharge Plan: SNF    Current Diagnoses: Patient Active Problem List   Diagnosis Date Noted   Acute renal failure (ARF) ([place]) 96/29/5284   Acute metabolic encephalopathy 13/24/4010   Hyperkalemia 09/14/2021   Uremia 09/14/2021   Hypoxia 09/14/2021   Obesity (BMI 30-39.9) 06/16/2021   Persistent atrial fibrillation with RVR ([place]) 27/25/3664   Complicated UTI (urinary tract infection) 06/12/2021   Urinary retention with incomplete bladder emptying 06/12/2021   Debility 05/24/2021   AKI with acute urinary retention  05/21/2021   Constipation 05/21/2021   Right renal mass 05/21/2021   Leukocytosis 05/21/2021   Parkinson disease ([place]) 05/21/2021   Persistent atrial fibrillation ([place]) 11/08/2012   Central serous retinopathy    Hypertension    GERD (gastroesophageal reflux disease)    Kidney stone    History of torn meniscus of right knee    Edema of extremities    Hyperlipidemia    BPH (benign prostatic hyperplasia)    Coronary artery disease s/p PCI to RCA     OSA (obstructive sleep apnea) 09/29/2011    Orientation RESPIRATION BLADDER Height & Weight     Self, Time, Situation, Place  Normal Continent Weight: 249 lb 9 oz (113.2 kg) Height:  '5\' 8"'$  (172.7 cm)  BEHAVIORAL SYMPTOMS/MOOD NEUROLOGICAL BOWEL NUTRITION STATUS       Incontinent Diet (See DC Summary)  AMBULATORY STATUS COMMUNICATION OF NEEDS Skin     Verbally Normal                       Personal Care Assistance Level of Assistance  Feeding, Bathing, Dressing           Functional Limitations Info  Sight, Hearing, Speech Sight Info: Adequate Hearing Info: Adequate Speech Info: Adequate    SPECIAL CARE FACTORS FREQUENCY                       Contractures Contractures Info: Not present    Additional Factors Info  Code Status, Allergies Code Status Info: DNR Allergies Info: Sulfa Antibiotics           Current Medications (09/19/2021):  This is the current hospital active medication list Current Facility-Administered Medications  Medication Dose Route Frequency Provider Last Rate Last Admin   [START ON 09/24/2021] amantadine (SYMMETREL) capsule 200 mg  200 mg Oral Q7 days Little Ishikawa, MD       apixaban Arne [place]) tablet 5 mg  5 mg Oral BID Einar Grad, RPH   5 mg at 09/19/21 1101   carbidopa-levodopa (SINEMET IR) 25-100 MG per tablet immediate release 3 tablet  3 tablet Oral 3 times per day Tu, Ching T, DO   3 tablet at 09/19/21 4034   Chlorhexidine Gluconate Cloth 2 % PADS 6 each  6 each Topical Daily  Ileene Musa T, DO   6 each at 09/17/21 1014   Chlorhexidine Gluconate Cloth 2 % PADS 6 each  6 each Topical Q0600 Rosita Fire, MD   6 each at 09/19/21 0510   entacapone (COMTAN) tablet 200 mg  200 mg Oral 3 times per day Tu, Ching T, DO   200 mg at 09/19/21 4403   escitalopram (LEXAPRO) tablet 5 mg  5 mg Oral Daily Hosie Poisson, MD   5 mg at 09/19/21 1100   finasteride (PROSCAR) tablet 5 mg  5 mg Oral Daily Hosie Poisson, MD   5 mg at 09/19/21 1101   metoprolol tartrate (LOPRESSOR) tablet 50 mg  50 mg Oral BID Shela Leff, MD   50 mg at 09/19/21 1100   pantoprazole (PROTONIX) EC tablet 40 mg  40 mg Oral Daily Hosie Poisson, MD   40 mg at 09/19/21 1101   polyethylene glycol (MIRALAX / GLYCOLAX)  packet 17 g  17 g Oral Daily PRN Little Ishikawa, MD       senna-docusate (Senokot-S) tablet 1 tablet  1 tablet Oral BID PRN Little Ishikawa, MD         Discharge Medications: Please see discharge summary for a list of discharge medications.  Relevant Imaging Results:  Relevant Lab Results:   Additional Information SS# [id number]; patient reports COVID vax x5;  5'9" 260lbs  Reece Agar, LCSWA

## 2021-09-20 DIAGNOSIS — G9341 Metabolic encephalopathy: Secondary | ICD-10-CM | POA: Diagnosis not present

## 2021-09-20 DIAGNOSIS — E875 Hyperkalemia: Secondary | ICD-10-CM | POA: Diagnosis not present

## 2021-09-20 DIAGNOSIS — I2510 Atherosclerotic heart disease of native coronary artery without angina pectoris: Secondary | ICD-10-CM | POA: Diagnosis not present

## 2021-09-20 DIAGNOSIS — N170 Acute kidney failure with tubular necrosis: Secondary | ICD-10-CM | POA: Diagnosis not present

## 2021-09-20 LAB — BASIC METABOLIC PANEL
Anion gap: 12 (ref 5–15)
BUN: 57 mg/dL — ABNORMAL HIGH (ref 8–23)
CO2: 24 mmol/L (ref 22–32)
Calcium: 8.4 mg/dL — ABNORMAL LOW (ref 8.9–10.3)
Chloride: 98 mmol/L (ref 98–111)
Creatinine, Ser: 9.33 mg/dL — ABNORMAL HIGH (ref 0.61–1.24)
GFR, Estimated: 6 mL/min — ABNORMAL LOW (ref >60)
Glucose, Bld: 111 mg/dL — ABNORMAL HIGH (ref 70–99)
Potassium: 3.6 mmol/L (ref 3.5–5.1)
Sodium: 134 mmol/L — ABNORMAL LOW (ref 135–145)

## 2021-09-20 MED ORDER — MORPHINE SULFATE (CONCENTRATE) 10 MG/0.5ML PO SOLN
5.0000 mg | ORAL | Status: DC | PRN
  Administered 2021-09-20 – 2021-09-22 (×5): 5 mg via ORAL
  Filled 2021-09-20 (×5): qty 0.5

## 2021-09-20 MED ORDER — MORPHINE SULFATE (PF) 2 MG/ML IV SOLN
1.0000 mg | INTRAVENOUS | Status: DC | PRN
  Filled 2021-09-20: qty 1

## 2021-09-20 MED ORDER — LORAZEPAM 2 MG/ML IJ SOLN
1.0000 mg | INTRAMUSCULAR | Status: DC | PRN
  Filled 2021-09-20: qty 1

## 2021-09-20 MED ORDER — AMLODIPINE BESYLATE 10 MG PO TABS
10.0000 mg | ORAL_TABLET | Freq: Every day | ORAL | Status: DC
  Administered 2021-09-20: 10 mg via ORAL
  Filled 2021-09-20: qty 1

## 2021-09-20 MED ORDER — LORAZEPAM 2 MG/ML PO CONC
2.0000 mg | ORAL | Status: DC | PRN
  Administered 2021-09-20 – 2021-09-22 (×5): 2 mg via ORAL
  Filled 2021-09-20 (×7): qty 1

## 2021-09-20 MED ORDER — LABETALOL HCL 5 MG/ML IV SOLN
5.0000 mg | Freq: Once | INTRAVENOUS | Status: AC
Start: 2021-09-20 — End: 2021-09-20
  Administered 2021-09-20: 5 mg via INTRAVENOUS
  Filled 2021-09-20: qty 4

## 2021-09-20 NOTE — Progress Notes (Addendum)
[facility] NEPH[facility] PROGRESS NOTE  Assessment/ Plan: Pt is a 71 y.o. yo male with past medical history of Parkinson disease, chronic urine retention, A-fib, CAD status post stent, hypertension, HLD, admitted with increasing confusion, lethargic and AKI.  #Acute kidney injury, nonoliguric presumably ATN from IV contrast.  The patient had CT abdomen pelvis with contrast on 8/14, renal ultrasound with decompressed bladder, mild left hydro.  Received  HD on 8/27, 8/28 and 8/30 for uremia and myoclonic jerk.   After 3 session of dialysis it seemed like his mental status was improved initially.  Today, he looks more lethargic and delirious and his BUN is only 57.  I think his declining mental status has multifactorial etiology mainly underlying CNS disease like Parkinson etc. He has no sign of renal recovery.  Very minimal urine output and creatinine level elevated to 9.33.  I have informed to the patient's significant other that the dialysis would not increase the quality of his life and probably prolong his suffering.  It will be very challenging to transport him back and forth to HD unit and he may not even tolerate.  In this situation, I recommend against continuing dialysis.  My recommendation is to follow-up with palliative care for residential hospice care.  We tried to call  patient's son from the bedside without sucess.  However, I had a long discussion with him yesterday.  #Myoclonic jerk and acute metabolic/uremic encephalopathy: CT head with mild cerebral atrophy with no acute finding.  His mental status is waxing and waning probably more contributed by his underlying CNS disease.  #Chronic urine retention with indwelling Foley catheter: Noted dark-colored urine and some hematuria.  May need to hold Eliquis, anticoagulation management deferred to primary team.  #Metabolic acidosis: improved with dialysis.  # HTN/volume: On amlodipine and metoprolol.  Monitor  BP.Marland Kitchen  #Hyperkalemia: Potassium level is normal.  Subjective: He looks more confused and lethargic this morning.  Urine output is recorded only 200 cc which is dark and more pinkish.  Review of system is limited because of his encephalopathy.  His significant other was present at the bedside.   Objective Vital signs in last 24 hours: Vitals:   09/20/21 0005 09/20/21 0427 09/20/21 0637 09/20/21 0838  BP: (!) 132/95 (!) 187/117 (!) 140/103 (!) 146/99  Pulse: 96 (!) 101 94 (!) 103  Resp: '18 18  17  '$ Temp: (!) 97.4 F (36.3 C) (!) 97.3 F (36.3 C)  98 F (36.7 C)  TempSrc: Oral Oral  Oral  SpO2: 97% 98%  97%  Weight:      Height:       Weight change:   Intake/Output Summary (Last 24 hours) at 09/20/2021 1214 Last data filed at 09/20/2021 0438 Gross per 24 hour  Intake 0 ml  Output 200 ml  Net -200 ml        Labs: RENAL PANEL Recent Labs    05/21/21 1203 05/22/21 0150 05/22/21 0213 05/23/21 0235 05/23/21 0240 05/25/21 0200 05/26/21 0544 05/27/21 0704 05/30/21 0620 06/03/21 0807 06/04/21 0537 06/12/21 1405 06/13/21 0305 06/16/21 0849 07/11/21 1812 07/20/21 1912 09/02/21 2131 09/13/21 2313 09/13/21 2343 09/14/21 0256 09/14/21 1927 09/15/21 0052 09/16/21 0143 09/17/21 1350 09/18/21 0248 09/19/21 0116 09/20/21 0210  NA 140  --    < >  --    < > 142  --  143 142 139   < > 139   < > 142 143   < > 143 140 140 138 139  138  141 137 138 137 137 134*  K 3.7  --    < >  --    < > 3.4*  --  3.3* 3.8 3.2*   < > 4.3   < > 3.1* 3.5   < > 3.4* 5.5* 5.3* 4.8 5.2*  5.1 5.4* 4.2 3.7 3.4* 3.4* 3.6  CL 106  --    < >  --    < > 106  --  104 108 104   < > 106   < > 104 105   < > 111 106 105 106 107  105 105 106 101 100 102 98  CO2 25  --    < >  --    < > 26  --  32 28 28   < > 25   < > 31 28   < > 23 26  --  23 19*  20* 20* 19* '26 25 25 24  '$ GLUCOSE 129*  --    < >  --    < > 123*  --  122* 111* 122*   < > 136*   < > 156* 133*   < > 126* 129* 120* 109* 95  96 87 101* 115*  142* 129* 111*  BUN 57*  --    < >  --    < > 17  --  '15 16 16   '$ < > 10   < > 17 13   < > 23 114* >130* 111* 110*  109* 109* 80* 53* 62* 45* 57*  CREATININE 4.70*  --    < >  --    < > 0.71  --  0.73 0.82 0.85   < > 0.73   < > 0.93 0.97   < > 1.04 9.98* 10.50* 9.82* 10.70*  10.48* 10.73* 8.37* 7.66* 8.64* 7.66* 9.33*  CALCIUM 8.7*  --    < >  --    < > 8.3*  --  8.5* 8.7* 8.6*   < > 8.7*   < > 8.6* 8.7*   < > 8.9 8.3*  --  8.0* 8.3*  8.2* 8.5* 8.0* 8.3* 8.3* 8.1* 8.4*  MG  --  2.0  --  1.5*  --  1.5* 1.6* 1.4* 1.8 1.7  --   --   --  1.9  --   --   --   --   --   --   --  3.4*  --   --   --   --   --   PHOS  --  3.7  --   --   --   --   --   --   --   --   --   --   --   --   --   --   --   --   --   --  6.4* 6.6*  --  5.7*  --   --   --   ALBUMIN 3.5  --   --   --   --   --   --  3.0*  --   --   --  3.2*  --   --  3.6  --  3.4* 2.8*  --   --  2.9* 3.1*  --  2.6*  --   --   --    < > = values in this interval not displayed.      Liver Function Tests: Recent  Labs  Lab 09/13/21 2313 09/14/21 1927 09/15/21 0052 09/17/21 1350  AST 8*  --  9*  --   ALT <5  --  <5  --   ALKPHOS 78  --  84  --   BILITOT 0.5  --  0.9  --   PROT 5.8*  --  6.2*  --   ALBUMIN 2.8* 2.9* 3.1* 2.6*    No results for input(s): "LIPASE", "AMYLASE" in the last 168 hours. No results for input(s): "AMMONIA" in the last 168 hours. CBC: Recent Labs    09/13/21 2313 09/13/21 2343 09/14/21 0256 09/15/21 0052 09/16/21 0143  HGB 11.5* 11.6* 11.2* 12.2* 11.5*  MCV 101.1*  --  100.0 99.5 98.9     Cardiac Enzymes: Recent Labs  Lab 09/13/21 2313  CKTOTAL 24*    CBG: No results for input(s): "GLUCAP" in the last 168 hours.  Iron Studies: No results for input(s): "IRON", "TIBC", "TRANSFERRIN", "FERRITIN" in the last 72 hours. Studies/Results: No results found.  Medications: Infusions:   Scheduled Medications:  [START ON 09/24/2021] amantadine  200 mg Oral Q7 days   amLODipine  10 mg Oral Daily    apixaban  5 mg Oral BID   carbidopa-levodopa  3 tablet Oral 3 times per day   Chlorhexidine Gluconate Cloth  6 each Topical Daily   Chlorhexidine Gluconate Cloth  6 each Topical Q0600   entacapone  200 mg Oral 3 times per day   escitalopram  5 mg Oral Daily   finasteride  5 mg Oral Daily   metoprolol tartrate  50 mg Oral BID   pantoprazole  40 mg Oral Daily    have reviewed scheduled and prn medications.  Physical Exam: General: Confused and more lethargic today, Heart:RRR, s1s2 nl Lungs: Clear anteriorly, no wheezing or increased work of breathing Abdomen:soft, Non-tender, non-distended Extremities: No peripheral edema present. Dialysis Access: Left IJ temporary HD catheter placed by PCCM on 8/27, site clean  Darcia Lampi [employer] 09/20/2021,12:14 PM  LOS: 6 days

## 2021-09-20 NOTE — Progress Notes (Signed)
PROGRESS NOTE    Isaiah Pena  EGB:[id number] DOB: 02-15-1950 DOA: 09/13/2021 PCP: Mayra Neer, MD   Brief Narrative:  Isaiah Pena is a 71 y.o. male with medical history significant of  Parkinson's disease, chronic urinary retention with indwelling foley, persistent A.fib on Xarelto, CAD s/p PCI, HTN, HLD who presents with increasing lethargy and confusion and found to have acute renal failure.  Assessment & Plan:   Principal Problem:   Acute renal failure (ARF) (HCC) Active Problems:   Persistent atrial fibrillation (HCC)   Parkinson disease ([place])   Hypertension   Coronary artery disease s/p PCI to RCA    Obesity (BMI 30-39.9)   Acute metabolic encephalopathy   Hyperkalemia   Uremia   Hypoxia  Goals of care  -Lengthy discussion today with nephrology and family in regards to patient's ongoing if not worsening renal function.  No further indication to continue dialysis per nephrology at this point.  We discussed at length over the past few days with family that he would likely not be an ideal candidate for long-term dialysis. -In light of this patient will transition to comfort measures with planned discharge per discussion with hospice.  Family indicates they are not against bringing the patient home with hospice, but want to ensure appropriate managed care at home which they are worried will not be feasible.  Ultimately we did discuss hospice house which seems more appropriate given their wishes. -Initiated IV Ativan and morphine, unfortunately patient lost IV access and to limit discomfort will avoid replacing IV and transition to sublingual morphine and Ativan in the interim. -In-hospital demise is not unreasonable in this patient given his somewhat rapid decline over the past few days.  Acute AKI is likely secondary to ATN from previous contrast p Acute metabolic encephalopathy secondary to uremia -Nephrology following HD being discontinued as above  Acute metabolic  encephalopathy secondary to uremia  -Likely will continue to worsen in the setting of dialysis discontinuation -Continue supportive care, appreciate palliative care insight and recommendations  History of Parkinson's disease Continue with carbidopa and levodopa. Resume amantadine -pharmacy assisting with renal dosing   Hypertension, essential:  Continue metoprolol/amlodipine in the interim, if he cannot take p.o. medications these are reasonable to hold  Hyperlipidemia:  Discontinue statin   Obesity.  Body mass index is 38.52 kg/m  Permanent atrial fibrillation  Continue Eliquis and rate control medications including metoprolol in the interim while p.o. intake is appropriate, these can be transitioned off once p.o. is less favorable   Chronic urinary retention with indwelling Foley catheter placement. Continue indwelling Foley catheter  Coronary artery disease s/p PCI Asymptomatic, no indication for work-up or evaluation  DVT prophylaxis: Eliquis Code Status: DNR Family Communication: Wife at bedside  Status is: Inpatient  Dispo: The patient is from: ALF              Anticipated d/c is to: Hospice house versus home with hospice pending above              Anticipated d/c date is: 24 to 48 hours -patient stable for discharge once safe disposition has been formally decided upon by family   Consultants:  Nephrology, palliative care  Procedures:  Tunneled cath as above  Antimicrobials:  None  Subjective: No acute issues or events overnight -continues to worsen over the past 12 hours despite dialysis, transitioning to comfort care as above.  Review of systems limited per patient, wife indicates patient appears comfortable and would like to continue  aggressive symptom management in the setting of myoclonic jerking and confusion.  Objective: Vitals:   09/20/21 0000 09/20/21 0005 09/20/21 0427 09/20/21 0637  BP: (!) 147/115 (!) 132/95 (!) 187/117 (!) 140/103  Pulse: 83 96  (!) 101 94  Resp: '18 18 18   '$ Temp:  (!) 97.4 F (36.3 C) (!) 97.3 F (36.3 C)   TempSrc:  Oral Oral   SpO2: 96% 97% 98%   Weight:      Height:        Intake/Output Summary (Last 24 hours) at 09/20/2021 0809 Last data filed at 09/20/2021 0438 Gross per 24 hour  Intake 0 ml  Output 200 ml  Net -200 ml    Filed Weights   09/16/21 1908 09/18/21 1541 09/18/21 2020  Weight: 112.7 kg 114.7 kg 113.2 kg    Examination:  General:  Pleasantly resting in bed, No acute distress. HEENT:  Normocephalic atraumatic.  Sclerae nonicteric, noninjected.  Extraocular movements intact bilaterally. Neck:  Without mass or deformity.  Trachea is midline. Lungs:  Clear to auscultate bilaterally without rhonchi, wheeze, or rales. Heart:  Regular rate and rhythm.  Without murmurs, rubs, or gallops. Abdomen:  Soft, nontender, nondistended.  Without guarding or rebound. Extremities: Without cyanosis, clubbing, edema, or obvious deformity. Vascular: Left IJ temporary catheter Skin:  Warm and dry, no erythema, no ulcerations.  Data Reviewed: I have personally reviewed following labs and imaging studies  CBC: Recent Labs  Lab 09/13/21 2313 09/13/21 2343 09/14/21 0256 09/15/21 0052 09/16/21 0143  WBC 7.4  --  7.7 8.5 8.5  NEUTROABS 5.5  --   --  7.0 7.4  HGB 11.5* 11.6* 11.2* 12.2* 11.5*  HCT 35.2* 34.0* 33.8* 36.8* 34.7*  MCV 101.1*  --  100.0 99.5 98.9  PLT 244  --  232 223 161    Basic Metabolic Panel: Recent Labs  Lab 09/14/21 1927 09/15/21 0052 09/16/21 0143 09/17/21 1350 09/18/21 0248 09/19/21 0116 09/20/21 0210  NA 139  138 141 137 138 137 137 134*  K 5.2*  5.1 5.4* 4.2 3.7 3.4* 3.4* 3.6  CL 107  105 105 106 101 100 102 98  CO2 19*  20* 20* 19* '26 25 25 24  '$ GLUCOSE 95  96 87 101* 115* 142* 129* 111*  BUN 110*  109* 109* 80* 53* 62* 45* 57*  CREATININE 10.70*  10.48* 10.73* 8.37* 7.66* 8.64* 7.66* 9.33*  CALCIUM 8.3*  8.2* 8.5* 8.0* 8.3* 8.3* 8.1* 8.4*  MG  --  3.4*  --    --   --   --   --   PHOS 6.4* 6.6*  --  5.7*  --   --   --     GFR: Estimated Creatinine Clearance: 9 mL/min (A) (by C-G formula based on SCr of 9.33 mg/dL (H)). Liver Function Tests: Recent Labs  Lab 09/13/21 2313 09/14/21 1927 09/15/21 0052 09/17/21 1350  AST 8*  --  9*  --   ALT <5  --  <5  --   ALKPHOS 78  --  84  --   BILITOT 0.5  --  0.9  --   PROT 5.8*  --  6.2*  --   ALBUMIN 2.8* 2.9* 3.1* 2.6*    No results for input(s): "LIPASE", "AMYLASE" in the last 168 hours. No results for input(s): "AMMONIA" in the last 168 hours. Coagulation Profile: Recent Labs  Lab 09/15/21 0812  INR 1.5*    Cardiac Enzymes: Recent Labs  Lab 09/13/21 2313  CKTOTAL 24*    BNP (last 3 results) No results for input(s): "PROBNP" in the last 8760 hours. HbA1C: No results for input(s): "HGBA1C" in the last 72 hours. CBG: No results for input(s): "GLUCAP" in the last 168 hours. Lipid Profile: No results for input(s): "CHOL", "HDL", "LDLCALC", "TRIG", "CHOLHDL", "LDLDIRECT" in the last 72 hours. Thyroid Function Tests: No results for input(s): "TSH", "T4TOTAL", "FREET4", "T3FREE", "THYROIDAB" in the last 72 hours. Anemia Panel: No results for input(s): "VITAMINB12", "FOLATE", "FERRITIN", "TIBC", "IRON", "RETICCTPCT" in the last 72 hours. Sepsis Labs: No results for input(s): "PROCALCITON", "LATICACIDVEN" in the last 168 hours.  Recent Results (from the past 240 hour(s))  Culture, Urine (Do not remove urinary catheter, catheter placed by urology or difficult to place)     Status: None   Collection Time: 09/14/21  5:12 AM   Specimen: Urine, Catheterized  Result Value Ref Range Status   Specimen Description URINE, CATHETERIZED  Final   Special Requests NONE  Final   Culture   Final    NO GROWTH Performed at [facility], [address] [place], [place] [address]    Report Status 09/16/2021 FINAL  Final    6 radiology Studies: No results found.  Scheduled Meds:   [START ON 09/24/2021] amantadine  200 mg Oral Q7 days   apixaban  5 mg Oral BID   carbidopa-levodopa  3 tablet Oral 3 times per day   Chlorhexidine Gluconate Cloth  6 each Topical Daily   Chlorhexidine Gluconate Cloth  6 each Topical Q0600   entacapone  200 mg Oral 3 times per day   escitalopram  5 mg Oral Daily   finasteride  5 mg Oral Daily   metoprolol tartrate  50 mg Oral BID   pantoprazole  40 mg Oral Daily   Continuous Infusions:   LOS: 6 days   Time spent: 57mn  Shenelle Klas C Aarvi Stotts, DO [facility]  If 7PM-7AM, please contact night-coverage [email or web address]  09/20/2021, 8:09 AM

## 2021-09-20 NOTE — Care Management Important Message (Signed)
**Note Isaiah-Identified via Obfuscation** Important Message  Patient Details  Name: Isaiah Pena MRN: [id number] Date of Birth: 28-Oct-1950   Medicare Important Message Given:  Yes     Hannah Beat 09/20/2021, 1:18 PM

## 2021-09-21 DIAGNOSIS — N170 Acute kidney failure with tubular necrosis: Secondary | ICD-10-CM | POA: Diagnosis not present

## 2021-09-21 DIAGNOSIS — G9341 Metabolic encephalopathy: Secondary | ICD-10-CM | POA: Diagnosis not present

## 2021-09-21 DIAGNOSIS — Z515 Encounter for palliative care: Secondary | ICD-10-CM

## 2021-09-21 DIAGNOSIS — E875 Hyperkalemia: Secondary | ICD-10-CM | POA: Diagnosis not present

## 2021-09-21 DIAGNOSIS — I2510 Atherosclerotic heart disease of native coronary artery without angina pectoris: Secondary | ICD-10-CM | POA: Diagnosis not present

## 2021-09-21 NOTE — Progress Notes (Addendum)
HD catheter removed per order and with secure chat confirmation to remove from MD.  Pressure held to achieve hemostasis, gauze, vaseline gauze and tegaderm applied, aftercare instructions provided to wife and son who verbalized understanding.

## 2021-09-21 NOTE — TOC Progression Note (Addendum)
Transition of Care [facility]) - Progression Note    Patient Details  Name: Isaiah Pena MRN: [id number] Date of Birth: 07-31-1950  Transition of Care [facility]) CM/SW Contact  Ina Homes, [place] Phone Number: 09/21/2021, 11:55 AM  Clinical Narrative:     Per chart review,   Pt has active referral to [place].   SW spoke with [facility] [place]) reports she will reach out to pt's son. Benjamine Mola reports it is difficult to get MD hospice order over weekend, best plan would be to d/c back to [facility] under palliative and they can transition to Hospice on Monday.   SW spoke with Juanda Crumble [facility] [phone]) reports will reach out to admissions to confirm if able to accept back over weekend.  Update 1239pm SW spoke with Elnita Maxwell [phone]) reports can accept pt back tomorrow as long as d/c summary is complete and sent by 12pm. Kirsten reports team will send hospice order to [facility] once pt is back.   SW updated Benjamine Mola, who reports Hospice MD has had previous conversations with pt's son regarding returning to [place] with hospice vs Hospice House.   Update 200pm SW notified by RN, pt's son requesting to speak about Hospice options.   SW met with pt, pt's son [name] and pt's SO Kristy at bedside. [name] reports he has been discussing placement further with Dr. Avon Gully, and wanted to see if pt could qualify for Hospice Home. [name] believes the peaceful environment will be better for pt as he approaches end of life. SW explained Hospice Home criteria. [name] verbalized understanding and if pt does not qualify at this time he is agreeable to returning to [place].   SW left message with Benjamine Mola ([place]) to speak with pt's son regarding Hospice Home.   Update 449pm SW informed by [facility] [place]) pt does not meet Hospice Home criteria at this time. Benjamine Mola will update son.    Expected Discharge  Plan: [place] Barriers to Discharge: Continued Medical Work up  Expected Discharge Plan and Services Expected Discharge Plan: [place] In-house Referral: Clinical Social Work   Post Acute Care Choice: [place] Living arrangements for the past 2 months: [place]                                       Social Determinants of Health (SDOH) Interventions    Readmission Risk Interventions     No data to display

## 2021-09-21 NOTE — Progress Notes (Signed)
PROGRESS NOTE    Isaiah Pena  WUJ:[id number] DOB: 25-Oct-1950 DOA: 09/13/2021 PCP: Mayra Neer, MD   Brief Narrative:  Isaiah Pena is a 71 y.o. male with medical history significant of  Parkinson's disease, chronic urinary retention with indwelling foley, persistent A.fib on Xarelto, CAD s/p PCI, HTN, HLD who presents with increasing lethargy and confusion and found to have acute renal failure.  Assessment & Plan:   Principal Problem:   Acute renal failure (ARF) (HCC) Active Problems:   Persistent atrial fibrillation (HCC)   Parkinson disease ([place])   Hypertension   Coronary artery disease s/p PCI to RCA    Obesity (BMI 30-39.9)   Acute metabolic encephalopathy   Hyperkalemia   Uremia   Hypoxia  Goals of care  -Continue to transition to comfort measures - plan to DC back to [facility] w/ hospice 09/22/21 per family discussion with hospice. Discussed hospice house vs hospice at home with family. Request DC to [facility] per discussion today. -Continue SL morphine/ativan for symptoms management. -Appreciate palliative consult in further discussion for needs pending discharge -In-hospital demise is not unreasonable in this patient given his somewhat rapid decline over the past few days. -DC all PO meds other than parkinsons medications, if he is able to take them safely would likely result in improved quality of life.  Acute AKI is likely secondary to ATN from previous contrast Acute metabolic encephalopathy secondary to uremia Acute metabolic encephalopathy secondary to uremia, ongoing  History of Parkinson's disease Continue with carbidopa/levodopa/amantadine as able to improve symptoms/quality of life - if unable to take these safely we will hold Hypertension, essential:  Hyperlipidemia:   Obesity.  Permanent atrial fibrillation  Chronic urinary retention with indwelling Foley catheter placement. Coronary artery disease s/p PCI  DVT prophylaxis: Eliquis Code Status:  DNR Family Communication: Wife at bedside  Status is: Inpatient  Dispo: The patient is from: ALF              Anticipated d/c is to: Hospice house versus SNF with hospice pending above discussion              Anticipated d/c date is: immindent -patient stable for discharge once safe disposition has been formally decided upon by family   Consultants:  Nephrology, palliative care  Procedures:  Tunneled cath as above  Antimicrobials:  None  Subjective: Markedly worsening tremors overnight - increase meds for comfort as above  Objective: Vitals:   09/20/21 0757 09/20/21 0838 09/20/21 1645 09/21/21 0437  BP:  (!) 146/99 (!) 154/96 (!) 142/91  Pulse:  (!) 103 78 85  Resp:  '17 17 15  '$ Temp:  98 F (36.7 C) (!) 97.5 F (36.4 C) 98 F (36.7 C)  TempSrc:  Oral Oral Oral  SpO2: 96% 97% 97% 96%  Weight:      Height:        Intake/Output Summary (Last 24 hours) at 09/21/2021 1453 Last data filed at 09/21/2021 0437 Gross per 24 hour  Intake --  Output 350 ml  Net -350 ml    Filed Weights   09/16/21 1908 09/18/21 1541 09/18/21 2020  Weight: 112.7 kg 114.7 kg 113.2 kg    Examination:  General:  Pleasantly resting in bed, No acute distress. HEENT:  Normocephalic atraumatic.  Sclerae nonicteric, noninjected.  Extraocular movements intact bilaterally. Neck:  Without mass or deformity.  Trachea is midline. Lungs:  Clear to auscultate bilaterally without rhonchi, wheeze, or rales. Heart:  Regular rate and rhythm.  Without murmurs,  rubs, or gallops. Abdomen:  Soft, nontender, nondistended.  Without guarding or rebound. Extremities: Without cyanosis, clubbing, edema, or obvious deformity. Vascular: Left IJ temporary catheter Skin:  Warm and dry, no erythema, no ulcerations.  Data Reviewed: I have personally reviewed following labs and imaging studies  CBC: Recent Labs  Lab 09/15/21 0052 09/16/21 0143  WBC 8.5 8.5  NEUTROABS 7.0 7.4  HGB 12.2* 11.5*  HCT 36.8* 34.7*  MCV  99.5 98.9  PLT 223 778    Basic Metabolic Panel: Recent Labs  Lab 09/14/21 1927 09/15/21 0052 09/16/21 0143 09/17/21 1350 09/18/21 0248 09/19/21 0116 09/20/21 0210  NA 139  138 141 137 138 137 137 134*  K 5.2*  5.1 5.4* 4.2 3.7 3.4* 3.4* 3.6  CL 107  105 105 106 101 100 102 98  CO2 19*  20* 20* 19* '26 25 25 24  '$ GLUCOSE 95  96 87 101* 115* 142* 129* 111*  BUN 110*  109* 109* 80* 53* 62* 45* 57*  CREATININE 10.70*  10.48* 10.73* 8.37* 7.66* 8.64* 7.66* 9.33*  CALCIUM 8.3*  8.2* 8.5* 8.0* 8.3* 8.3* 8.1* 8.4*  MG  --  3.4*  --   --   --   --   --   PHOS 6.4* 6.6*  --  5.7*  --   --   --     GFR: Estimated Creatinine Clearance: 9 mL/min (A) (by C-G formula based on SCr of 9.33 mg/dL (H)). Liver Function Tests: Recent Labs  Lab 09/14/21 1927 09/15/21 0052 09/17/21 1350  AST  --  9*  --   ALT  --  <5  --   ALKPHOS  --  84  --   BILITOT  --  0.9  --   PROT  --  6.2*  --   ALBUMIN 2.9* 3.1* 2.6*    No results for input(s): "LIPASE", "AMYLASE" in the last 168 hours. No results for input(s): "AMMONIA" in the last 168 hours. Coagulation Profile: Recent Labs  Lab 09/15/21 0812  INR 1.5*    Cardiac Enzymes: No results for input(s): "CKTOTAL", "CKMB", "CKMBINDEX", "TROPONINI" in the last 168 hours.  BNP (last 3 results) No results for input(s): "PROBNP" in the last 8760 hours. HbA1C: No results for input(s): "HGBA1C" in the last 72 hours. CBG: No results for input(s): "GLUCAP" in the last 168 hours. Lipid Profile: No results for input(s): "CHOL", "HDL", "LDLCALC", "TRIG", "CHOLHDL", "LDLDIRECT" in the last 72 hours. Thyroid Function Tests: No results for input(s): "TSH", "T4TOTAL", "FREET4", "T3FREE", "THYROIDAB" in the last 72 hours. Anemia Panel: No results for input(s): "VITAMINB12", "FOLATE", "FERRITIN", "TIBC", "IRON", "RETICCTPCT" in the last 72 hours. Sepsis Labs: No results for input(s): "PROCALCITON", "LATICACIDVEN" in the last 168 hours.  Recent  Results (from the past 240 hour(s))  Culture, Urine (Do not remove urinary catheter, catheter placed by urology or difficult to place)     Status: None   Collection Time: 09/14/21  5:12 AM   Specimen: Urine, Catheterized  Result Value Ref Range Status   Specimen Description URINE, CATHETERIZED  Final   Special Requests NONE  Final   Culture   Final    NO GROWTH Performed at [facility], [address] [place], [place] [address]    Report Status 09/16/2021 FINAL  Final    6 radiology Studies: No results found.  Scheduled Meds:  [START ON 09/24/2021] amantadine  200 mg Oral Q7 days   amLODipine  10 mg Oral Daily  apixaban  5 mg Oral BID   carbidopa-levodopa  3 tablet Oral 3 times per day   Chlorhexidine Gluconate Cloth  6 each Topical Daily   Chlorhexidine Gluconate Cloth  6 each Topical Q0600   entacapone  200 mg Oral 3 times per day   escitalopram  5 mg Oral Daily   finasteride  5 mg Oral Daily   metoprolol tartrate  50 mg Oral BID   pantoprazole  40 mg Oral Daily   Continuous Infusions:   LOS: 7 days   Time spent: 27mn  Blima Jaimes C Tequia Wolman, DO [facility]  If 7PM-7AM, please contact night-coverage [email or web address]  09/21/2021, 2:53 PM

## 2021-09-21 NOTE — Progress Notes (Signed)
Brief Nephrology note;  Noted patient is transitioning to comfort care only. Palliative care to see for hospice care/follow up. Can remove HD catheter. D/w primary team.   Sign off, please call back with question.

## 2021-09-21 NOTE — Progress Notes (Signed)
Brief Palliative Medicine Progress Note:  PMT consult received and chart reviewed.   Dr. Hilma Favors and myself discussed case with Dr. Avon Gully and [facility] - patient has active referral with [facility]. TOC and/or hospice liaison to discuss discharge options of residential hospice vs return to [facility] with hospice.  Per TOC: unless family indicates change of interest to residential hospice, plan is for patient discharge back to [facility] with hospice tomorrow 9/3.  No further PMT needs identified. PMT will follow peripherally. If there are any imminent needs please call the service directly.  Thank you for allowing PMT to assist in the care of this patient.  Kimani Hovis M. [facility] Palliative Medicine Team Team Phone: [phone] NO CHARGE

## 2021-09-22 DIAGNOSIS — T508X5A Adverse effect of diagnostic agents, initial encounter: Secondary | ICD-10-CM

## 2021-09-22 DIAGNOSIS — Z515 Encounter for palliative care: Secondary | ICD-10-CM

## 2021-09-22 DIAGNOSIS — N1411 Contrast-induced nephropathy: Secondary | ICD-10-CM | POA: Diagnosis not present

## 2021-09-22 DIAGNOSIS — N170 Acute kidney failure with tubular necrosis: Secondary | ICD-10-CM | POA: Diagnosis not present

## 2021-09-22 MED ORDER — MORPHINE SULFATE (CONCENTRATE) 10 MG/0.5ML PO SOLN: 5.0000 mg | ORAL | 0 refills | Status: AC | PRN

## 2021-09-22 MED ORDER — LORAZEPAM 2 MG/ML PO CONC
2.0000 mg | ORAL | 0 refills | Status: AC | PRN
Start: 2021-09-22 — End: ?

## 2021-09-22 NOTE — TOC Transition Note (Addendum)
Transition of Care [facility]) - CM/SW Discharge Note   Patient Details  Name: Isaiah Pena MRN: [id number] Date of Birth: 01/03/1951  Transition of [facility]) CM/SW Contact:  Shalin Vonbargen, LCSW Phone Number: 09/22/2021, 11:39 AM   Clinical Narrative:    Patient will DC to: [place] Anticipated DC date: 09/22/21 Family notified: yes, son [name] Transport by: [facility]   Per MD patient ready for DC to . RN to call report prior to discharge [phone] ask for RN [facility] Room #902). RN, patient, patient's family, and facility notified of DC. Discharge Summary and FL2 sent to facility. DC packet on chart. Ambulance transport requested for patient. CSW spoke with nurse to verify about report and making sure comfort medications are filled at [facility] before [place] calls transportation for discharge. [facility] called for transportation for discharge today. DNR and hard scripts are in DC packet.  CSW will sign off for now as social work intervention is no longer needed. Please consult [place] again if new needs arise.     Final next level of care: [place] Barriers to Discharge: No Barriers Identified   Patient Goals and CMS Choice Patient states their goals for this hospitalization and ongoing recovery are:: Return to [facility] with Hospice CMS Medicare.gov Compare Post Acute Care list provided to:: Patient Choice offered to / list presented to : Patient  Discharge Placement              Patient [facility] bed at: [facility] Patient to be transferred to facility by: [facility] [facility] Name of family member notified: Graylon Gunning Patient and family notified of of transfer: 09/22/21  Discharge Plan and Services In-house Referral: Clinical Social Work   Post Acute Care Choice: [place]                               Social Determinants of Health (SDOH) Interventions     Readmission Risk Interventions     No data to display

## 2021-09-22 NOTE — Progress Notes (Signed)
Spoke with Caryl Pina at [facility] and she verified that if we can fax the hard copies of Rx, they can stat get it to the pharmacy for filling. Once we verify that they have the meds definitely, we can call [facility].

## 2021-09-22 NOTE — TOC Progression Note (Addendum)
Transition of Care [facility]) - Progression Note    Patient Details  Name: Isaiah Pena MRN: [id number] Date of Birth: [birth year]  Transition of Care [facility]) CM/SW Contact  Loghan Subia, LCSW Phone Number: 09/22/2021, 10:33 AM  Clinical Narrative:     CSW reached out to [place] with [facility] to verify if they spoke with the family and patient about the plan for discharge. Benjamine Mola reported the plan is for the patient to return to [facility] with hospice.  CSW reached out to [place], [place] to talk with Elnita Maxwell in admissions regarding discharge however no admission's coordinator currently at [facility]. CSW unable to leave voicemail at [facility] phone just kept ringing over and over. CSW called Hilton Cork with [facility] directly and had to leave a voicemail. 11:26am CSW spoke with [place] at [facility] and she reports they are familiar with Mr. Canal and that he can discharge back to room number 902. 1:02pm: CSW was able to speak to [place] at [place] and she confirmed they are able to accept Mr. Audi today for discharge.  TOC will continue to follow through discharge.   Expected Discharge Plan: [place] Barriers to Discharge: Continued Medical Work up  Expected Discharge Plan and Services Expected Discharge Plan: [place] In-house Referral: Clinical Social Work   Post Acute Care Choice: [place] Living arrangements for the past 2 months: [place] Expected Discharge Date: 09/22/21                                     Social Determinants of Health (SDOH) Interventions    Readmission Risk Interventions     No data to display

## 2021-09-22 NOTE — Discharge Summary (Signed)
Physician Discharge Summary  Isaiah Pena XKG:[id number] DOB: 03/07/1950 DOA: 09/13/2021  PCP: Mayra Neer, MD  Admit date: 09/13/2021 Discharge date: 09/22/2021  Admitted From: [facility] Disposition:  [facility] w/ hospice  Discharge Condition: Isaiah Pena  CODE STATUS:DNR  Diet recommendation: As tolerated comfort feeding   Brief/Interim Summary: Isaiah Pena is a 71 y.o. male with medical history significant of  Parkinson's disease, chronic urinary retention with indwelling foley, persistent A.fib on Xarelto, CAD s/p PCI, HTN, HLD who presents with increasing lethargy and confusion and found to have acute renal failure.  Unfortunately patient's renal function never fully recovered and after lengthy discussion as below with family nephrology and palliative care patient would not want to continue dialysis given his baseline and will transition to palliative care and hospice.  Plan is to discharge back to [facility] under hospice.  Patient is medically stable for discharge.  Discharge Diagnoses:   Goals of care/Hospice -Continue to transition to comfort measures -Continue SL morphine/ativan for symptoms management. -Appreciate palliative consult in further discussion for needs pending discharge -In-hospital demise is not unreasonable in this patient given his somewhat rapid decline over the past few days. -DC all PO meds other than parkinsons medications, if he is able to take them safely would likely result in improved quality of life.   Acute AKI is likely secondary to ATN from previous contrast Acute metabolic encephalopathy secondary to uremia Acute metabolic encephalopathy secondary to uremia, ongoing  History of Parkinson's disease Continue with carbidopa/levodopa/amantadine as able to improve symptoms/quality of life - if unable to take these safely we will hold Hypertension, essential:  Hyperlipidemia:   Obesity.  Permanent atrial fibrillation  Chronic urinary retention with  indwelling Foley catheter placement. Coronary artery disease s/p PCI   Discharge Instructions   Allergies as of 09/22/2021       Reactions   Sulfa Antibiotics Hives        Medication List     STOP taking these medications    acetaminophen 650 MG CR tablet Commonly known as: TYLENOL   Amantadine HCl 100 MG tablet   amLODipine 10 MG tablet Commonly known as: NORVASC   atorvastatin 10 MG tablet Commonly known as: LIPITOR   carbidopa-levodopa 25-100 MG tablet Commonly known as: SINEMET IR   cephALEXin 500 MG capsule Commonly known as: KEFLEX   clonazePAM 0.5 MG tablet Commonly known as: KLONOPIN   clotrimazole 1 % cream Commonly known as: LOTRIMIN   diclofenac Sodium 1 % Gel Commonly known as: VOLTAREN   entacapone 200 MG tablet Commonly known as: COMTAN   erythromycin ophthalmic ointment   escitalopram 5 MG tablet Commonly known as: LEXAPRO   esomeprazole 20 MG capsule Commonly known as: NEXIUM   ezetimibe 10 MG tablet Commonly known as: ZETIA   feeding supplement Liqd   finasteride 5 MG tablet Commonly known as: PROSCAR   lidocaine 4 %   magnesium gluconate 500 MG tablet Commonly known as: MAGONATE   magnesium oxide 400 MG tablet Commonly known as: MAG-OX   metoprolol tartrate 50 MG tablet Commonly known as: LOPRESSOR   MULTIPLE VITAMIN-FOLIC ACID PO   multivitamin with minerals Tabs tablet   Pataday 0.2 % Soln Generic drug: Olopatadine HCl   polyethylene glycol 17 g packet Commonly known as: MIRALAX / GLYCOLAX   potassium chloride SA 20 MEQ tablet Commonly known as: KLOR-CON M   rivaroxaban 20 MG Tabs tablet Commonly known as: Xarelto   tadalafil 5 MG tablet Commonly known as: CIALIS  traMADol 50 MG tablet Commonly known as: ULTRAM       TAKE these medications    LORazepam 2 MG/ML concentrated solution Commonly known as: ATIVAN Take 1 mL (2 mg total) by mouth every hour as needed for anxiety, seizure, sedation or  sleep (Comfort measures).   morphine CONCENTRATE 10 MG/0.5ML Soln concentrated solution Take 0.25 mLs (5 mg total) by mouth every hour as needed for severe pain (comfort).        Allergies  Allergen Reactions   Sulfa Antibiotics Hives    Consultations: Nephrology, palliative care  Procedures/Studies: DG CHEST PORT 1 VIEW  Result Date: 09/15/2021 CLINICAL DATA:  Central line placement. EXAM: PORTABLE CHEST 1 VIEW COMPARISON:  One-view chest x-ray 09/14/2021 FINDINGS: The heart is enlarged. Mild pulmonary vascular congestion is stable. No focal airspace disease is present. No pleural effusion is present. A new left IJ line is in place. The tip is at the confluence of the innominate vein and SVC. No pneumothorax is present. IMPRESSION: 1. Interval placement of left IJ line without radiographic evidence for complication. 2. Stable cardiomegaly and mild pulmonary vascular congestion. Electronically Signed   By: San Morelle M.D.   On: 09/15/2021 13:38   DG Abd 2 Views  Result Date: 09/14/2021 CLINICAL DATA:  Abdominal distention EXAM: ABDOMEN - 2 VIEW COMPARISON:  05/21/2021 FINDINGS: Examination is technically less than optimal. There is no significant small bowel dilation. There is moderate distention of colon. Stomach is not distended. IMPRESSION: Technically limited study.  Nonspecific bowel gas pattern. Electronically Signed   By: Elmer Picker M.D.   On: 09/14/2021 15:38   US RENAL  Result Date: 09/14/2021 CLINICAL DATA:  Acute renal failure EXAM: RENAL / URINARY TRACT ULTRASOUND COMPLETE COMPARISON:  Abdominal CT from 12 days ago FINDINGS: Right Kidney: Renal volume appears overestimated. 13 cm length. Mild hydronephrosis. Cystic change in scarring at the lower pole by CT is not well demonstrated sonographically. Left Kidney: Renally of 12 cm.  No hydronephrosis or mass. Bladder: Not visible.  Foley catheter is reportedly present. IMPRESSION: 1. Mild hydronephrosis of the  polycystic right kidney. 2. Decompressed bladder. Electronically Signed   By: Jorje Guild M.D.   On: 09/14/2021 04:50   DG CHEST PORT 1 VIEW  Result Date: 09/14/2021 CLINICAL DATA:  Hypoxia EXAM: PORTABLE CHEST 1 VIEW COMPARISON:  09/02/2021 FINDINGS: Mild left basilar atelectasis. Lungs are otherwise clear. No pneumothorax or pleural effusion. Cardiac size is mildly enlarged. Pulmonary vascularity is normal. No acute bone abnormality. IMPRESSION: Mild left basilar atelectasis. Stable cardiomegaly Electronically Signed   By: Fidela Salisbury M.D.   On: 09/14/2021 03:00   CT ABDOMEN PELVIS W CONTRAST  Result Date: 09/02/2021 CLINICAL DATA:  Status post fall. EXAM: CT ABDOMEN AND PELVIS WITH CONTRAST TECHNIQUE: Multidetector CT imaging of the abdomen and pelvis was performed using the standard protocol following bolus administration of intravenous contrast. RADIATION DOSE REDUCTION: This exam was performed according to the departmental dose-optimization program which includes automated exposure control, adjustment of the mA and/or kV according to patient size and/or use of iterative reconstruction technique. CONTRAST:  115m OMNIPAQUE IOHEXOL 300 MG/ML  SOLN COMPARISON:  May 21, 2021 FINDINGS: Lower chest: No acute abnormality. Hepatobiliary: There is diffuse fatty infiltration of the liver parenchyma. No focal liver abnormality is seen. Subcentimeter gallstones are seen within the lumen of an otherwise normal-appearing gallbladder. There is no evidence of biliary dilatation. Pancreas: Unremarkable. No pancreatic ductal dilatation or surrounding inflammatory changes. Spleen: Normal in size without  focal abnormality. Adrenals/Urinary Tract: Adrenal glands are unremarkable. Kidneys are normal in size. Stable 3.2 cm x 3.0 cm and 3.7 cm x 3.2 cm simple cysts are seen within the mid and lower right kidney. An additional stable, 3.8 cm x 2.0 cm partially calcified right renal cyst is noted. Mildly prominent  bilateral extrarenal pelvis are seen. No renal calculi are identified. The urinary bladder is poorly distended and subsequently limited in evaluation. There is mild diffuse urinary bladder wall thickening with a mild amount of surrounding inflammatory fat stranding. Stomach/Bowel: Stomach is within normal limits. Appendix appears normal. A large amount of stool is seen throughout the colon. No evidence of bowel wall thickening, distention, or inflammatory changes. Noninflamed diverticula are seen within the mid sigmoid colon. Vascular/Lymphatic: Aortic atherosclerosis. No enlarged abdominal or pelvic lymph nodes. Reproductive: Prostate gland is moderately enlarged. Other: A 7.7 cm x 4.7 cm fat containing left inguinal hernia is seen. A 2.9 cm x 2.0 cm fat containing right inguinal hernia is also noted. No abdominopelvic ascites. Musculoskeletal: Multilevel degenerative changes are seen throughout the lumbar spine with mild to moderate severity dextroscoliosis. IMPRESSION: 1. Cholelithiasis. 2. Hepatic steatosis. 3. Stable right renal cysts. 4. Sigmoid diverticulosis. 5. Findings suspicious for acute cystitis. Correlation with urinalysis is recommended. 6. Moderate severity prostatomegaly. 7. Aortic atherosclerosis. Aortic Atherosclerosis (ICD10-I70.0). Electronically Signed   By: Virgina Norfolk M.D.   On: 09/02/2021 22:39   CT HEAD WO CONTRAST  Result Date: 09/02/2021 CLINICAL DATA:  Patient fell, head and neck trauma. EXAM: CT HEAD WITHOUT CONTRAST CT CERVICAL SPINE WITHOUT CONTRAST TECHNIQUE: Multidetector CT imaging of the head and cervical spine was performed following the standard protocol without intravenous contrast. Multiplanar CT image reconstructions of the cervical spine were also generated. RADIATION DOSE REDUCTION: This exam was performed according to the departmental dose-optimization program which includes automated exposure control, adjustment of the mA and/or kV according to patient size  and/or use of iterative reconstruction technique. COMPARISON:  CT head dated July 11, 2021 FINDINGS: CT HEAD FINDINGS Brain: No evidence of acute infarction, hemorrhage, hydrocephalus, extra-axial collection or mass lesion/mass effect. Mild cerebral atrophy and chronic microvascular ischemic changes of the white matter, unchanged. Vascular: No hyperdense vessel or unexpected calcification. Skull: Normal. Negative for fracture or focal lesion. Sinuses/Orbits: Mucosal thickening of the ethmoid air cells. Other: None. CT CERVICAL SPINE FINDINGS Alignment: Straightening of the cervical spine. Skull base and vertebrae: No acute fracture. No primary bone lesion or focal pathologic process. Soft tissues and spinal canal: No prevertebral fluid or swelling. No visible canal hematoma. Heterogeneously enlarged left thyroid lobe punctate calcifications. Disc levels: Multilevel degenerate disc disease with disc height loss and minimal spurring. Mild right neural foraminal stenosis at C5-C6. Upper chest: Negative. Other: None IMPRESSION: CT head: 1. No acute intracranial abnormality. 2. Mild cerebral atrophy and chronic microvascular ischemic changes of the white matter. CT cervical spine: 1. No acute fracture or traumatic subluxation. 2. Mild degenerate disc disease prominent at C5-C6 with r mild right neural foraminal stenosis. 3. Enlarged left thyroid lobe, further evaluation with thyroid sonogram on nonemergent basis is recommended if not previously performed. Electronically Signed   By: Keane Police D.O.   On: 09/02/2021 22:35   CT CERVICAL SPINE WO CONTRAST  Result Date: 09/02/2021 CLINICAL DATA:  Patient fell, head and neck trauma. EXAM: CT HEAD WITHOUT CONTRAST CT CERVICAL SPINE WITHOUT CONTRAST TECHNIQUE: Multidetector CT imaging of the head and cervical spine was performed following the standard protocol without intravenous contrast. Multiplanar  CT image reconstructions of the cervical spine were also generated.  RADIATION DOSE REDUCTION: This exam was performed according to the departmental dose-optimization program which includes automated exposure control, adjustment of the mA and/or kV according to patient size and/or use of iterative reconstruction technique. COMPARISON:  CT head dated July 11, 2021 FINDINGS: CT HEAD FINDINGS Brain: No evidence of acute infarction, hemorrhage, hydrocephalus, extra-axial collection or mass lesion/mass effect. Mild cerebral atrophy and chronic microvascular ischemic changes of the white matter, unchanged. Vascular: No hyperdense vessel or unexpected calcification. Skull: Normal. Negative for fracture or focal lesion. Sinuses/Orbits: Mucosal thickening of the ethmoid air cells. Other: None. CT CERVICAL SPINE FINDINGS Alignment: Straightening of the cervical spine. Skull base and vertebrae: No acute fracture. No primary bone lesion or focal pathologic process. Soft tissues and spinal canal: No prevertebral fluid or swelling. No visible canal hematoma. Heterogeneously enlarged left thyroid lobe punctate calcifications. Disc levels: Multilevel degenerate disc disease with disc height loss and minimal spurring. Mild right neural foraminal stenosis at C5-C6. Upper chest: Negative. Other: None IMPRESSION: CT head: 1. No acute intracranial abnormality. 2. Mild cerebral atrophy and chronic microvascular ischemic changes of the white matter. CT cervical spine: 1. No acute fracture or traumatic subluxation. 2. Mild degenerate disc disease prominent at C5-C6 with r mild right neural foraminal stenosis. 3. Enlarged left thyroid lobe, further evaluation with thyroid sonogram on nonemergent basis is recommended if not previously performed. Electronically Signed   By: Keane Police D.O.   On: 09/02/2021 22:35   DG Chest Port 1 View  Result Date: 09/02/2021 CLINICAL DATA:  Trauma on blood thinners EXAM: PORTABLE CHEST 1 VIEW COMPARISON:  Radiographs 07/11/2021 FINDINGS: Unchanged mild cardiomegaly.  Normal pulmonary vascularity. No focal consolidation, pleural effusion, or pneumothorax. No displaced rib fractures. IMPRESSION: No acute abnormality. Electronically Signed   By: Placido Sou M.D.   On: 09/02/2021 21:41     Subjective: No acute issues or events overnight   Discharge Exam: Vitals:   09/21/21 2205 09/22/21 0705  BP: (!) 155/91 (!) 147/110  Pulse: 99 87  Resp: (!) 22 16  Temp: 98.7 F (37.1 C) 97.9 F (36.6 C)  SpO2: 99% 96%   Vitals:   09/20/21 1645 09/21/21 0437 09/21/21 2205 09/22/21 0705  BP: (!) 154/96 (!) 142/91 (!) 155/91 (!) 147/110  Pulse: 78 85 99 87  Resp: 17 15 (!) 22 16  Temp: (!) 97.5 F (36.4 C) 98 F (36.7 C) 98.7 F (37.1 C) 97.9 F (36.6 C)  TempSrc: Oral Oral Oral Oral  SpO2: 97% 96% 99% 96%  Weight:      Height:        General: Pt is somnolent, poorly arousable Cardiovascular: RRR, S1/S2 +, no rubs, no gallops Respiratory: CTA bilaterally, no wheezing, no rhonchi Abdominal: Soft, NT, ND, bowel sounds + Extremities: no edema, no cyanosis    The results of significant diagnostics from this hospitalization (including imaging, microbiology, ancillary and laboratory) are listed below for reference.     Microbiology: Recent Results (from the past 240 hour(s))  Culture, Urine (Do not remove urinary catheter, catheter placed by urology or difficult to place)     Status: None   Collection Time: 09/14/21  5:12 AM   Specimen: Urine, Catheterized  Result Value Ref Range Status   Specimen Description URINE, CATHETERIZED  Final   Special Requests NONE  Final   Culture   Final    NO GROWTH Performed at [facility], [address],  [place], [place] [address]    Report Status 09/16/2021 FINAL  Final     Labs: BNP (last 3 results) Recent Labs    05/21/21 1203  BNP 91.9   Basic Metabolic Panel: Recent Labs  Lab 09/16/21 0143 09/17/21 1350 09/18/21 0248 09/19/21 0116 09/20/21 0210  NA 137 138 137 137 134*  K 4.2 3.7  3.4* 3.4* 3.6  CL 106 101 100 102 98  CO2 19* '26 25 25 24  '$ GLUCOSE 101* 115* 142* 129* 111*  BUN 80* 53* 62* 45* 57*  CREATININE 8.37* 7.66* 8.64* 7.66* 9.33*  CALCIUM 8.0* 8.3* 8.3* 8.1* 8.4*  PHOS  --  5.7*  --   --   --    Liver Function Tests: Recent Labs  Lab 09/17/21 1350  ALBUMIN 2.6*   No results for input(s): "LIPASE", "AMYLASE" in the last 168 hours. No results for input(s): "AMMONIA" in the last 168 hours. CBC: Recent Labs  Lab 09/16/21 0143  WBC 8.5  NEUTROABS 7.4  HGB 11.5*  HCT 34.7*  MCV 98.9  PLT 211   Cardiac Enzymes: No results for input(s): "CKTOTAL", "CKMB", "CKMBINDEX", "TROPONINI" in the last 168 hours. BNP: Invalid input(s): "POCBNP" CBG: No results for input(s): "GLUCAP" in the last 168 hours. D-Dimer No results for input(s): "DDIMER" in the last 72 hours. Hgb A1c No results for input(s): "HGBA1C" in the last 72 hours. Lipid Profile No results for input(s): "CHOL", "HDL", "LDLCALC", "TRIG", "CHOLHDL", "LDLDIRECT" in the last 72 hours. Thyroid function studies No results for input(s): "TSH", "T4TOTAL", "T3FREE", "THYROIDAB" in the last 72 hours.  Invalid input(s): "FREET3" Anemia work up No results for input(s): "VITAMINB12", "FOLATE", "FERRITIN", "TIBC", "IRON", "RETICCTPCT" in the last 72 hours. Urinalysis    Component Value Date/Time   COLORURINE AMBER (A) 09/14/2021 0512   APPEARANCEUR CLOUDY (A) 09/14/2021 0512   LABSPEC 1.014 09/14/2021 0512   PHURINE 5.0 09/14/2021 0512   GLUCOSEU NEGATIVE 09/14/2021 0512   HGBUR LARGE (A) 09/14/2021 0512   BILIRUBINUR NEGATIVE 09/14/2021 0512   KETONESUR NEGATIVE 09/14/2021 0512   PROTEINUR 100 (A) 09/14/2021 0512   NITRITE NEGATIVE 09/14/2021 0512   LEUKOCYTESUR MODERATE (A) 09/14/2021 0512   Sepsis Labs Recent Labs  Lab 09/16/21 0143  WBC 8.5   Microbiology Recent Results (from the past 240 hour(s))  Culture, Urine (Do not remove urinary catheter, catheter placed by urology or  difficult to place)     Status: None   Collection Time: 09/14/21  5:12 AM   Specimen: Urine, Catheterized  Result Value Ref Range Status   Specimen Description URINE, CATHETERIZED  Final   Special Requests NONE  Final   Culture   Final    NO GROWTH Performed at [facility], [address], [place], [place] [address]    Report Status 09/16/2021 FINAL  Final     Time coordinating discharge: Over 30 minutes  SIGNED:   Little Ishikawa, DO [facility] 09/22/2021, 8:03 AM Pager   If 7PM-7AM, please contact night-coverage [email or web address]

## 2021-09-22 NOTE — Progress Notes (Signed)
Pt transported back to [facility] via [facility].

## 2021-09-22 NOTE — Progress Notes (Signed)
Report given to [facility] at [facility], pt ready for transport.Reviewed instructions and papers with the brother, who is at bedside. He will call the son and other family that he is getting ready to go.

## 2021-09-24 DIAGNOSIS — G20 Parkinson's disease: Secondary | ICD-10-CM | POA: Diagnosis not present

## 2021-09-24 DIAGNOSIS — K219 Gastro-esophageal reflux disease without esophagitis: Secondary | ICD-10-CM | POA: Diagnosis not present

## 2021-09-24 DIAGNOSIS — I2510 Atherosclerotic heart disease of native coronary artery without angina pectoris: Secondary | ICD-10-CM | POA: Diagnosis not present

## 2021-09-24 DIAGNOSIS — G9341 Metabolic encephalopathy: Secondary | ICD-10-CM | POA: Diagnosis not present

## 2021-09-24 DIAGNOSIS — Z66 Do not resuscitate: Secondary | ICD-10-CM | POA: Diagnosis not present

## 2021-09-24 DIAGNOSIS — I4819 Other persistent atrial fibrillation: Secondary | ICD-10-CM | POA: Diagnosis not present

## 2021-09-24 DIAGNOSIS — M1990 Unspecified osteoarthritis, unspecified site: Secondary | ICD-10-CM | POA: Diagnosis not present

## 2021-09-24 DIAGNOSIS — Z960 Presence of urogenital implants: Secondary | ICD-10-CM | POA: Diagnosis not present

## 2021-09-24 DIAGNOSIS — I10 Essential (primary) hypertension: Secondary | ICD-10-CM | POA: Diagnosis not present

## 2021-09-24 DIAGNOSIS — N1411 Contrast-induced nephropathy: Secondary | ICD-10-CM | POA: Diagnosis not present

## 2021-09-24 DIAGNOSIS — N179 Acute kidney failure, unspecified: Secondary | ICD-10-CM | POA: Diagnosis not present

## 2021-09-24 DIAGNOSIS — Z87440 Personal history of urinary (tract) infections: Secondary | ICD-10-CM | POA: Diagnosis not present

## 2021-09-24 DIAGNOSIS — N400 Enlarged prostate without lower urinary tract symptoms: Secondary | ICD-10-CM | POA: Diagnosis not present

## 2021-09-24 DIAGNOSIS — E440 Moderate protein-calorie malnutrition: Secondary | ICD-10-CM | POA: Diagnosis not present

## 2021-09-24 DIAGNOSIS — R339 Retention of urine, unspecified: Secondary | ICD-10-CM | POA: Diagnosis not present

## 2021-09-24 DIAGNOSIS — D649 Anemia, unspecified: Secondary | ICD-10-CM | POA: Diagnosis not present

## 2021-09-24 DIAGNOSIS — Z436 Encounter for attention to other artificial openings of urinary tract: Secondary | ICD-10-CM | POA: Diagnosis not present

## 2021-09-25 DIAGNOSIS — Z436 Encounter for attention to other artificial openings of urinary tract: Secondary | ICD-10-CM | POA: Diagnosis not present

## 2021-09-25 DIAGNOSIS — N1411 Contrast-induced nephropathy: Secondary | ICD-10-CM | POA: Diagnosis not present

## 2021-09-25 DIAGNOSIS — G20 Parkinson's disease: Secondary | ICD-10-CM | POA: Diagnosis not present

## 2021-09-25 DIAGNOSIS — N179 Acute kidney failure, unspecified: Secondary | ICD-10-CM | POA: Diagnosis not present

## 2021-09-25 DIAGNOSIS — R339 Retention of urine, unspecified: Secondary | ICD-10-CM | POA: Diagnosis not present

## 2021-09-25 DIAGNOSIS — Z87440 Personal history of urinary (tract) infections: Secondary | ICD-10-CM | POA: Diagnosis not present

## 2021-09-26 ENCOUNTER — Other Ambulatory Visit: Payer: Self-pay | Admitting: *Deleted

## 2021-09-26 DIAGNOSIS — Z436 Encounter for attention to other artificial openings of urinary tract: Secondary | ICD-10-CM | POA: Diagnosis not present

## 2021-09-26 DIAGNOSIS — G20 Parkinson's disease: Secondary | ICD-10-CM | POA: Diagnosis not present

## 2021-09-26 DIAGNOSIS — N1411 Contrast-induced nephropathy: Secondary | ICD-10-CM | POA: Diagnosis not present

## 2021-09-26 DIAGNOSIS — N179 Acute kidney failure, unspecified: Secondary | ICD-10-CM | POA: Diagnosis not present

## 2021-09-26 DIAGNOSIS — Z87440 Personal history of urinary (tract) infections: Secondary | ICD-10-CM | POA: Diagnosis not present

## 2021-09-26 DIAGNOSIS — R339 Retention of urine, unspecified: Secondary | ICD-10-CM | POA: Diagnosis not present

## 2021-09-26 NOTE — Patient Outreach (Signed)
[facility] Post- Acute Care Coordinator follow up. Isaiah Pena resides in [facility].   Met with SNF social workers and Civil engineer, contracting at [employer]. Isaiah Pena has transitioned to hospice.   Marthenia Rolling, MSN, RN,BSN [facility] Acute Care Coordinator [phone] [facility]) [phone]  (Toll free office)

## 2021-09-27 DIAGNOSIS — Z436 Encounter for attention to other artificial openings of urinary tract: Secondary | ICD-10-CM | POA: Diagnosis not present

## 2021-09-27 DIAGNOSIS — N1411 Contrast-induced nephropathy: Secondary | ICD-10-CM | POA: Diagnosis not present

## 2021-09-27 DIAGNOSIS — Z87440 Personal history of urinary (tract) infections: Secondary | ICD-10-CM | POA: Diagnosis not present

## 2021-09-27 DIAGNOSIS — N179 Acute kidney failure, unspecified: Secondary | ICD-10-CM | POA: Diagnosis not present

## 2021-09-27 DIAGNOSIS — R339 Retention of urine, unspecified: Secondary | ICD-10-CM | POA: Diagnosis not present

## 2021-09-27 DIAGNOSIS — G20 Parkinson's disease: Secondary | ICD-10-CM | POA: Diagnosis not present

## 2021-09-28 DIAGNOSIS — N179 Acute kidney failure, unspecified: Secondary | ICD-10-CM | POA: Diagnosis not present

## 2021-09-28 DIAGNOSIS — Z436 Encounter for attention to other artificial openings of urinary tract: Secondary | ICD-10-CM | POA: Diagnosis not present

## 2021-09-28 DIAGNOSIS — N1411 Contrast-induced nephropathy: Secondary | ICD-10-CM | POA: Diagnosis not present

## 2021-09-28 DIAGNOSIS — Z87440 Personal history of urinary (tract) infections: Secondary | ICD-10-CM | POA: Diagnosis not present

## 2021-09-28 DIAGNOSIS — R339 Retention of urine, unspecified: Secondary | ICD-10-CM | POA: Diagnosis not present

## 2021-09-28 DIAGNOSIS — G20 Parkinson's disease: Secondary | ICD-10-CM | POA: Diagnosis not present

## 2021-10-20 DEATH — deceased

## 2021-11-15 ENCOUNTER — Encounter: Payer: PRIVATE HEALTH INSURANCE | Admitting: Physical Medicine and Rehabilitation

## 2022-01-03 ENCOUNTER — Ambulatory Visit: Payer: PRIVATE HEALTH INSURANCE | Admitting: Cardiology

## 2024-01-27 IMAGING — DX DG FEMUR 2+V*L*
4 series · 4 of 4 positions shown · non-contrast
Comparison: None Available.

CLINICAL DATA: Fall.

EXAM:
LEFT FEMUR 2 VIEWS

[femur ap (1 of 2)]
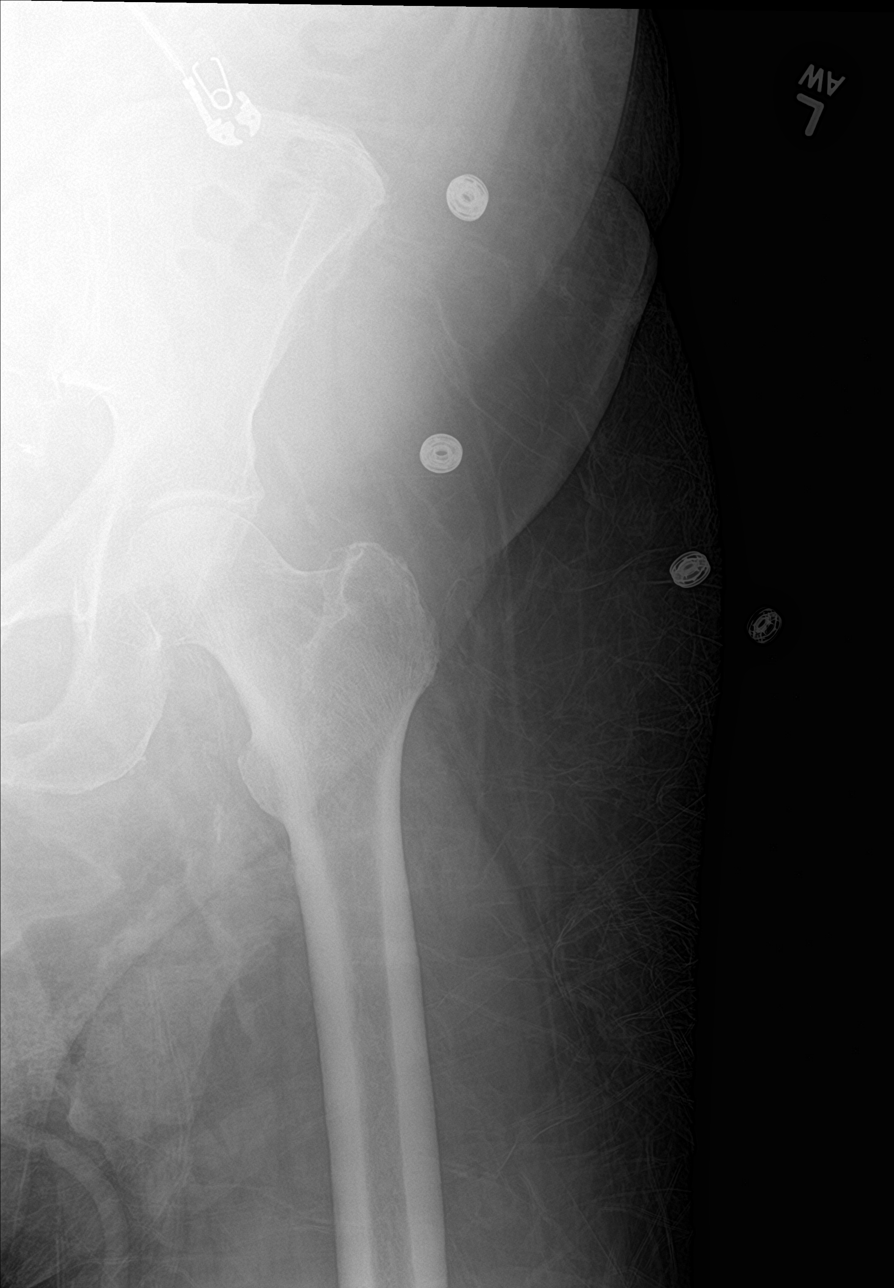

[femur ap (2 of 2)]
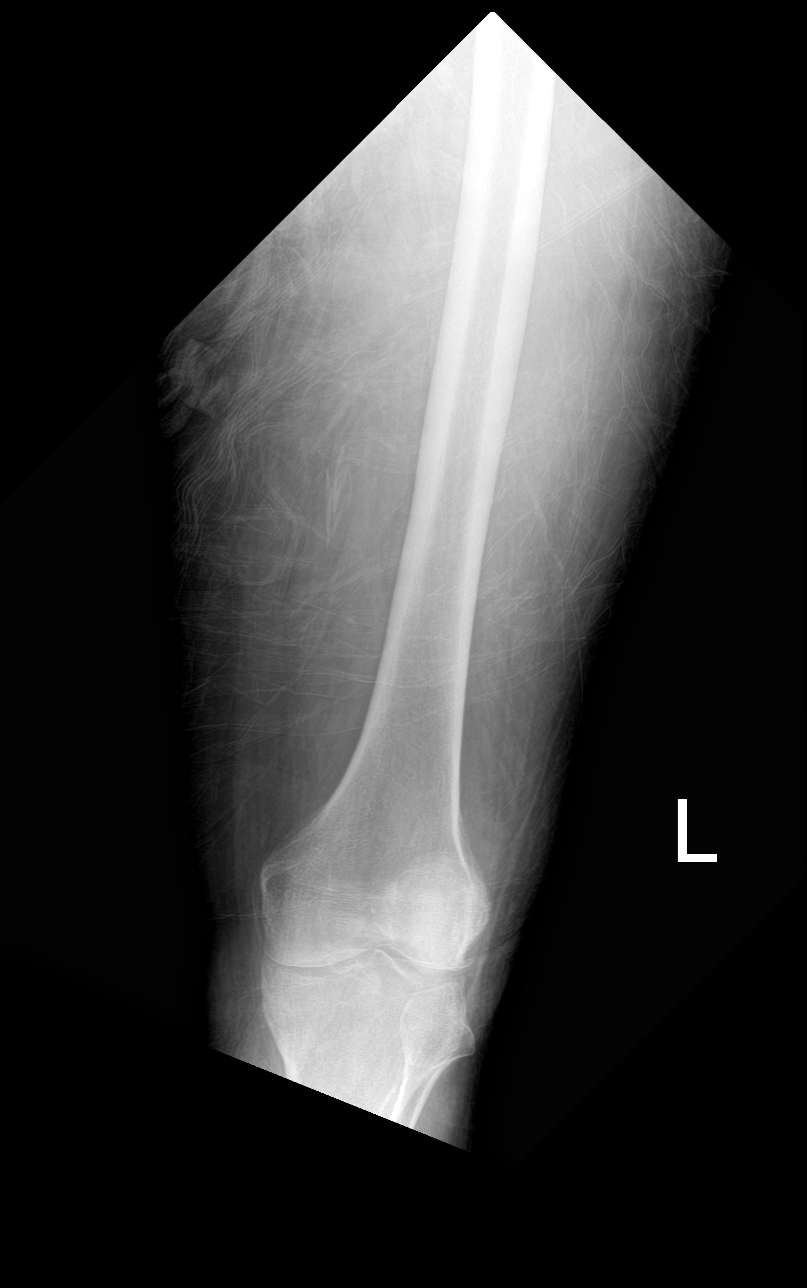

[femur lat (1 of 2)]
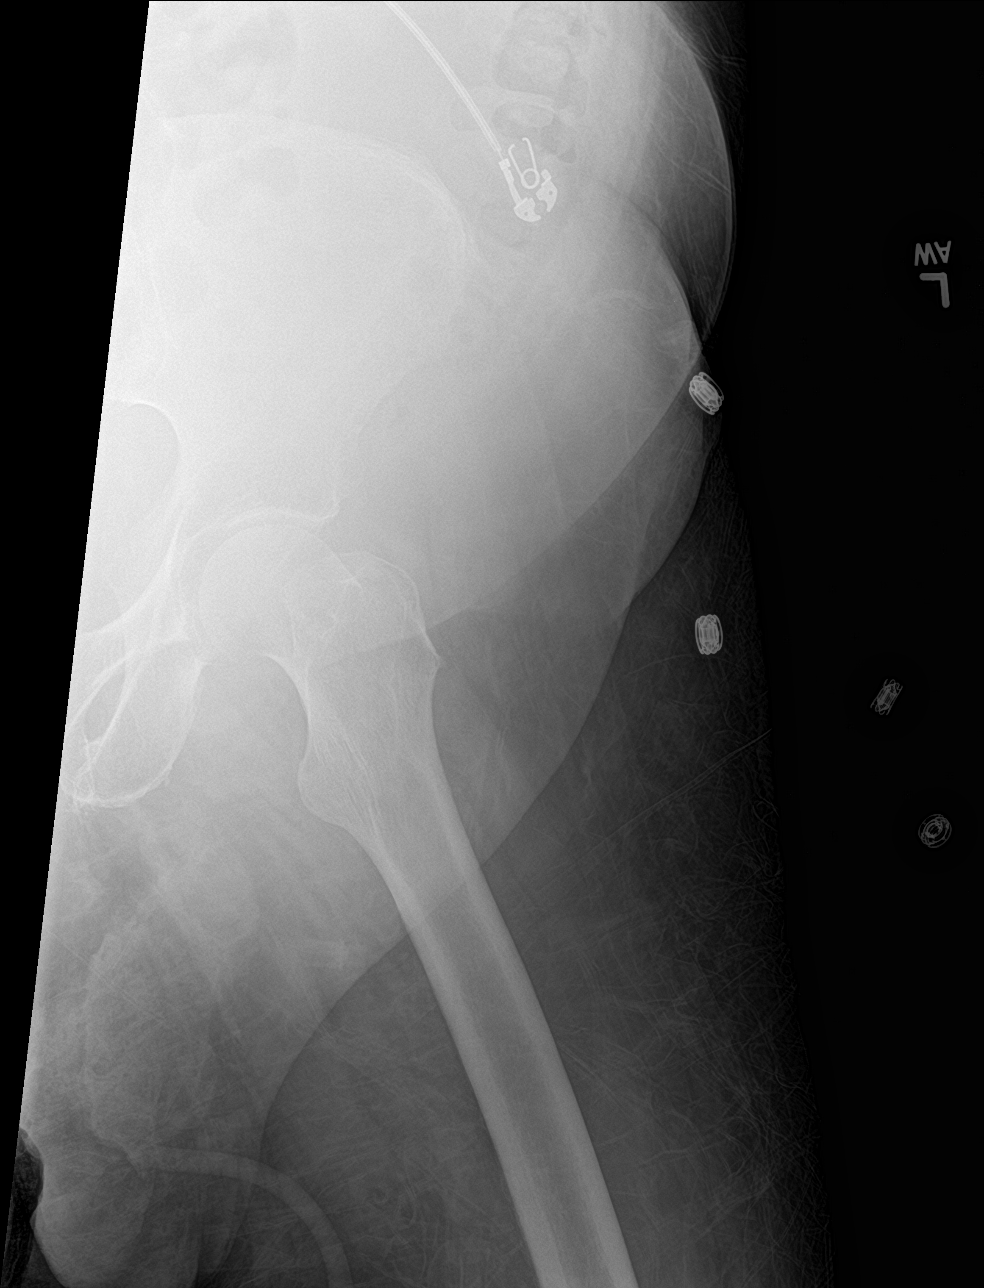

[femur lat (2 of 2)]
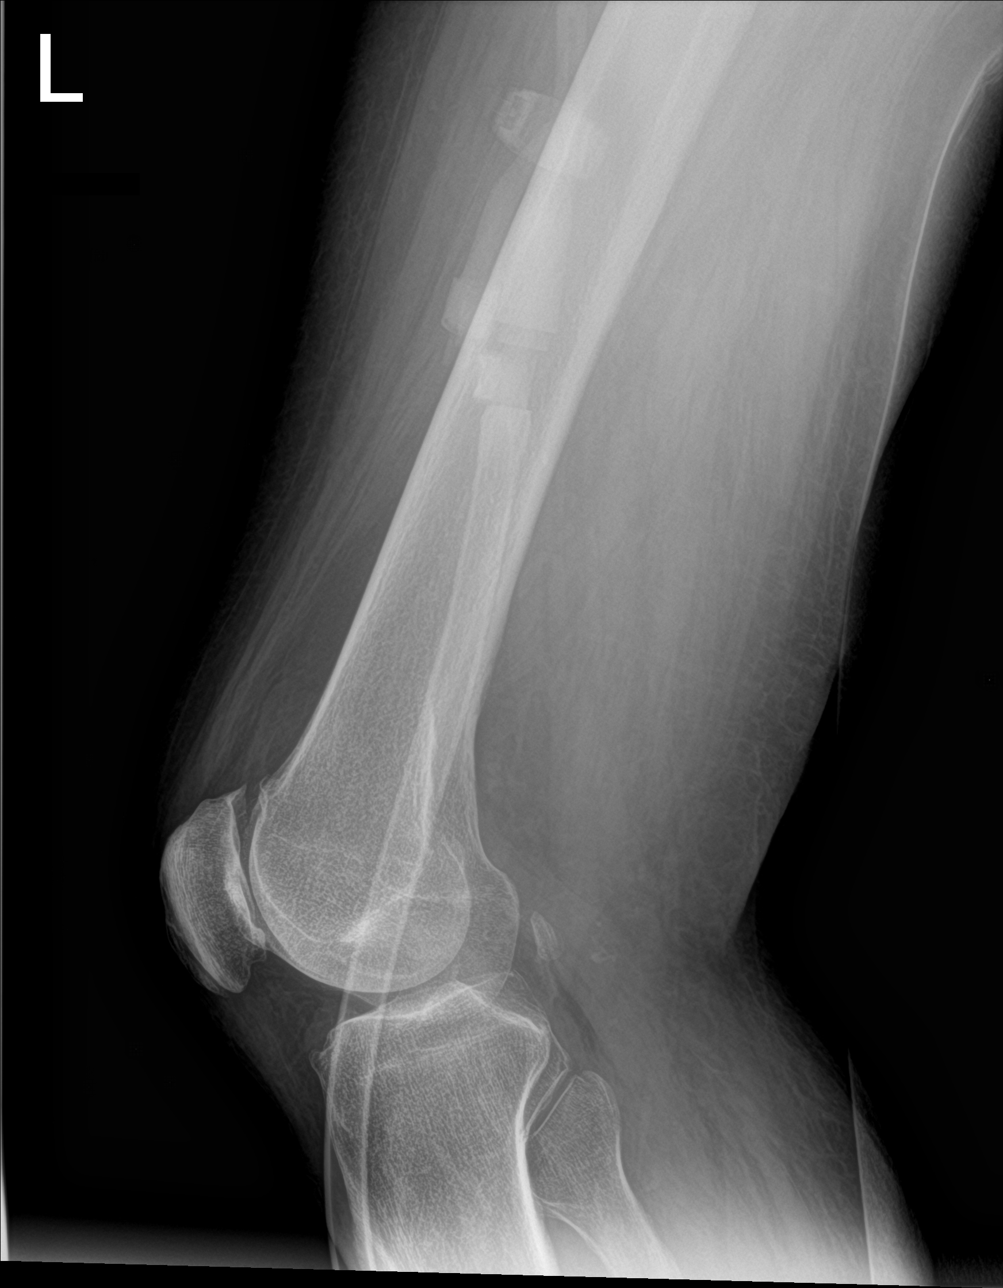

[4 of 4 positions shown; findings below may reference images not displayed]

FINDINGS: Negative for fracture.  Normal hip

Degenerative change in the knee.
IMPRESSION: Negative for fracture
